# Patient Record
Sex: Female | Born: 1960
Health system: Southern US, Community
[De-identification: ages and names within clinical notes are randomized; demographics above are authoritative.]

## PROBLEM LIST (undated history)

## (undated) DIAGNOSIS — M1990 Unspecified osteoarthritis, unspecified site: Secondary | ICD-10-CM

## (undated) DIAGNOSIS — F419 Anxiety disorder, unspecified: Secondary | ICD-10-CM

## (undated) DIAGNOSIS — B1920 Unspecified viral hepatitis C without hepatic coma: Secondary | ICD-10-CM

## (undated) DIAGNOSIS — I10 Essential (primary) hypertension: Secondary | ICD-10-CM

## (undated) DIAGNOSIS — J45909 Unspecified asthma, uncomplicated: Secondary | ICD-10-CM

## (undated) DIAGNOSIS — F319 Bipolar disorder, unspecified: Secondary | ICD-10-CM

## (undated) DIAGNOSIS — J449 Chronic obstructive pulmonary disease, unspecified: Secondary | ICD-10-CM

## (undated) DIAGNOSIS — F1910 Other psychoactive substance abuse, uncomplicated: Secondary | ICD-10-CM

## (undated) DIAGNOSIS — M359 Systemic involvement of connective tissue, unspecified: Secondary | ICD-10-CM

## (undated) HISTORY — PX: ABDOMINAL HYSTERECTOMY: SHX81

## (undated) HISTORY — PX: TONSILLECTOMY: SUR1361

## (undated) HISTORY — PX: FOOT SURGERY: SHX648

## (undated) HISTORY — PX: TONSILLECTOMY: SHX5217

---

## 2004-05-13 ENCOUNTER — Inpatient Hospital Stay: Payer: Self-pay | Admitting: Unknown Physician Specialty

## 2004-12-21 ENCOUNTER — Emergency Department: Payer: Self-pay | Admitting: Emergency Medicine

## 2005-05-08 ENCOUNTER — Inpatient Hospital Stay: Payer: Self-pay | Admitting: Unknown Physician Specialty

## 2005-12-14 ENCOUNTER — Emergency Department: Payer: Self-pay | Admitting: Unknown Physician Specialty

## 2006-01-19 ENCOUNTER — Emergency Department: Payer: Self-pay | Admitting: Unknown Physician Specialty

## 2006-01-19 ENCOUNTER — Other Ambulatory Visit: Payer: Self-pay

## 2006-05-08 ENCOUNTER — Other Ambulatory Visit: Payer: Self-pay

## 2006-05-08 ENCOUNTER — Inpatient Hospital Stay: Payer: Self-pay | Admitting: Internal Medicine

## 2006-08-06 ENCOUNTER — Emergency Department: Payer: Self-pay | Admitting: Emergency Medicine

## 2006-10-16 ENCOUNTER — Inpatient Hospital Stay: Payer: Self-pay | Admitting: Internal Medicine

## 2006-10-16 ENCOUNTER — Other Ambulatory Visit: Payer: Self-pay

## 2007-12-13 ENCOUNTER — Emergency Department: Payer: Self-pay | Admitting: Emergency Medicine

## 2007-12-13 ENCOUNTER — Other Ambulatory Visit: Payer: Self-pay
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
## 2008-08-21 ENCOUNTER — Inpatient Hospital Stay: Payer: Self-pay | Admitting: Internal Medicine

## 2008-08-22 ENCOUNTER — Ambulatory Visit: Payer: Self-pay | Admitting: Cardiology

## 2010-01-18 ENCOUNTER — Emergency Department: Payer: Self-pay | Admitting: Emergency Medicine

## 2010-04-01 ENCOUNTER — Emergency Department: Payer: Self-pay | Admitting: Emergency Medicine

## 2010-09-22 ENCOUNTER — Emergency Department: Payer: Self-pay | Admitting: Emergency Medicine

## 2011-07-07 ENCOUNTER — Emergency Department: Payer: Self-pay | Admitting: Emergency Medicine

## 2011-10-13 ENCOUNTER — Emergency Department: Payer: Self-pay | Admitting: Emergency Medicine

## 2011-12-15 ENCOUNTER — Emergency Department: Payer: Self-pay | Admitting: Emergency Medicine

## 2012-03-28 ENCOUNTER — Emergency Department: Payer: Self-pay | Admitting: Emergency Medicine

## 2012-11-20 ENCOUNTER — Emergency Department: Payer: Self-pay | Admitting: Emergency Medicine

## 2013-08-20 ENCOUNTER — Emergency Department: Payer: Self-pay | Admitting: Internal Medicine

## 2014-10-05 ENCOUNTER — Ambulatory Visit
Admit: 2014-10-05 | Disposition: A | Discharge status: Home / Self Care | Payer: Self-pay | Attending: Podiatry | Admitting: Podiatry

## 2015-09-13 ENCOUNTER — Encounter: Payer: Self-pay | Admitting: Emergency Medicine

## 2015-09-13 ENCOUNTER — Emergency Department
Admission: EM | Admit: 2015-09-13 | Discharge: 2015-09-13 | Disposition: A | Discharge status: Home / Self Care | Payer: Medicaid Other | Attending: Emergency Medicine | Admitting: Emergency Medicine

## 2015-09-13 ENCOUNTER — Emergency Department: Payer: Medicaid Other

## 2015-09-13 DIAGNOSIS — S8992XA Unspecified injury of left lower leg, initial encounter: Secondary | ICD-10-CM | POA: Diagnosis present

## 2015-09-13 DIAGNOSIS — S8012XA Contusion of left lower leg, initial encounter: Secondary | ICD-10-CM

## 2015-09-13 DIAGNOSIS — S40021A Contusion of right upper arm, initial encounter: Secondary | ICD-10-CM | POA: Diagnosis not present

## 2015-09-13 DIAGNOSIS — S90512A Abrasion, left ankle, initial encounter: Secondary | ICD-10-CM | POA: Diagnosis not present

## 2015-09-13 DIAGNOSIS — Y998 Other external cause status: Secondary | ICD-10-CM | POA: Insufficient documentation

## 2015-09-13 DIAGNOSIS — Y9389 Activity, other specified: Secondary | ICD-10-CM | POA: Insufficient documentation

## 2015-09-13 DIAGNOSIS — F17200 Nicotine dependence, unspecified, uncomplicated: Secondary | ICD-10-CM | POA: Insufficient documentation

## 2015-09-13 DIAGNOSIS — S80812A Abrasion, left lower leg, initial encounter: Secondary | ICD-10-CM

## 2015-09-13 DIAGNOSIS — Y9289 Other specified places as the place of occurrence of the external cause: Secondary | ICD-10-CM | POA: Insufficient documentation

## 2015-09-13 MED ORDER — IBUPROFEN 600 MG PO TABS: 600.0000 mg | ORAL_TABLET | Freq: Three times a day (TID) | ORAL | Status: DC | PRN

## 2015-09-13 MED ORDER — IBUPROFEN 600 MG PO TABS
600.0000 mg | ORAL_TABLET | Freq: Once | ORAL | Status: AC
  Administered 2015-09-13: 600 mg via ORAL
  Filled 2015-09-13: qty 1
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
## 2015-09-13 NOTE — ED Notes (Signed)
Patient received 100 mcg of fentanyl prior to arrival by EMS.
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
## 2015-09-13 NOTE — ED Notes (Signed)
Dr. Lord at bedside.  

## 2015-09-13 NOTE — ED Notes (Signed)
X-ray at bedside
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
## 2015-09-22 ENCOUNTER — Encounter: Payer: Self-pay | Admitting: Emergency Medicine

## 2015-09-22 ENCOUNTER — Emergency Department
Admission: EM | Admit: 2015-09-22 | Discharge: 2015-09-22 | Disposition: A | Discharge status: Home / Self Care | Payer: Medicaid Other | Attending: Emergency Medicine | Admitting: Emergency Medicine

## 2015-09-22 ENCOUNTER — Emergency Department: Payer: Medicaid Other

## 2015-09-22 DIAGNOSIS — T148XXA Other injury of unspecified body region, initial encounter: Secondary | ICD-10-CM

## 2015-09-22 DIAGNOSIS — S8012XA Contusion of left lower leg, initial encounter: Secondary | ICD-10-CM | POA: Insufficient documentation

## 2015-09-22 DIAGNOSIS — Y9289 Other specified places as the place of occurrence of the external cause: Secondary | ICD-10-CM | POA: Diagnosis not present

## 2015-09-22 DIAGNOSIS — Y9389 Activity, other specified: Secondary | ICD-10-CM | POA: Insufficient documentation

## 2015-09-22 DIAGNOSIS — Y998 Other external cause status: Secondary | ICD-10-CM | POA: Insufficient documentation

## 2015-09-22 DIAGNOSIS — W2203XA Walked into furniture, initial encounter: Secondary | ICD-10-CM | POA: Diagnosis not present

## 2015-09-22 DIAGNOSIS — F17200 Nicotine dependence, unspecified, uncomplicated: Secondary | ICD-10-CM | POA: Diagnosis not present

## 2015-09-22 DIAGNOSIS — S8992XA Unspecified injury of left lower leg, initial encounter: Secondary | ICD-10-CM | POA: Diagnosis present

## 2015-09-22 MED ORDER — IBUPROFEN 600 MG PO TABS: 600.0000 mg | ORAL_TABLET | Freq: Three times a day (TID) | ORAL | Status: DC | PRN

## 2015-09-22 MED ORDER — OXYCODONE HCL 5 MG PO TABS
5.0000 mg | ORAL_TABLET | Freq: Once | ORAL | Status: AC
  Administered 2015-09-22: 5 mg via ORAL
  Filled 2015-09-22: qty 1

## 2015-09-22 MED ORDER — MUPIROCIN 2 % EX OINT: TOPICAL_OINTMENT | CUTANEOUS | Status: DC

## 2015-09-22 NOTE — ED Notes (Signed)
Pt arrived via EMS from home. Pt seen and treated in ED 03/10 for ulcer to left leg. Pt states pain and swelling has increased. EMS reports VSS, T 97.0 oral, 98% RA.

## 2015-09-22 NOTE — ED Notes (Signed)
Crutch teaching done. Pt demonstrated understanding.

## 2015-09-22 NOTE — ED Notes (Signed)
Patient transported to Ultrasound 

## 2015-09-22 NOTE — Discharge Instructions (Signed)
Abrasion °An abrasion is a cut or scrape on the outer surface of your skin. An abrasion does not extend through all of the layers of your skin. It is important to care for your abrasion properly to prevent infection. °CAUSES °Most abrasions are caused by falling on or gliding across the ground or another surface. When your skin rubs on something, the outer and inner layer of skin rubs off.  °SYMPTOMS °A cut or scrape is the main symptom of this condition. The scrape may be bleeding, or it may appear red or pink. If there was an associated fall, there may be an underlying bruise. °DIAGNOSIS °An abrasion is diagnosed with a physical exam. °TREATMENT °Treatment for this condition depends on how large and deep the abrasion is. Usually, your abrasion will be cleaned with water and mild soap. This removes any dirt or debris that may be stuck. An antibiotic ointment may be applied to the abrasion to help prevent infection. A bandage (dressing) may be placed on the abrasion to keep it clean. °You may also need a tetanus shot. °HOME CARE INSTRUCTIONS °Medicines °· Take or apply medicines only as directed by your health care provider. °· If you were prescribed an antibiotic ointment, finish all of it even if you start to feel better. °Wound Care °· Clean the wound with mild soap and water 2-3 times per day or as directed by your health care provider. Pat your wound dry with a clean towel. Do not rub it. °· There are many different ways to close and cover a wound. Follow instructions from your health care provider about: °¨ Wound care. °¨ Dressing changes and removal. °· Check your wound every day for signs of infection. Watch for: °¨ Redness, swelling, or pain. °¨ Fluid, blood, or pus. °General Instructions °· Keep the dressing dry as directed by your health care provider. Do not take baths, swim, use a hot tub, or do anything that would put your wound underwater until your health care provider approves. °· If there is  swelling, raise (elevate) the injured area above the level of your heart while you are sitting or lying down. °· Keep all follow-up visits as directed by your health care provider. This is important. °SEEK MEDICAL CARE IF: °· You received a tetanus shot and you have swelling, severe pain, redness, or bleeding at the injection site. °· Your pain is not controlled with medicine. °· You have increased redness, swelling, or pain at the site of your wound. °SEEK IMMEDIATE MEDICAL CARE IF: °· You have a red streak going away from your wound. °· You have a fever. °· You have fluid, blood, or pus coming from your wound. °· You notice a bad smell coming from your wound or your dressing. °  °This information is not intended to replace advice given to you by your health care provider. Make sure you discuss any questions you have with your health care provider. °  °Document Released: 03/28/2005 Document Revised: 03/09/2015 Document Reviewed: 06/16/2014 °Elsevier Interactive Patient Education ©2016 Else[employer] Inc[employer] °Contusion °A contusion is a deep bruise. Contusions are the result of a blunt injury to tissues and muscle fibers under the skin. The injury causes bleeding under the skin. The skin overlying the contusion may turn blue, purple, or yellow. Minor injuries will give you a painless contusion, but more severe contusions may stay painful and swollen for a few weeks.  °CAUSES  °This condition is usually caused by a blow, trauma, or direct force to   an area of the body. °SYMPTOMS  °Symptoms of this condition include: °· Swelling of the injured area. °· Pain and tenderness in the injured area. °· Discoloration. The area may have redness and then turn blue, purple, or yellow. °DIAGNOSIS  °This condition is diagnosed based on a physical exam and medical history. An X-ray, CT scan, or MRI may be needed to determine if there are any associated injuries, such as broken bones (fractures). °TREATMENT  °Specific treatment for this  condition depends on what area of the body was injured. In general, the best treatment for a contusion is resting, icing, applying pressure to (compression), and elevating the injured area. This is often called the RICE strategy. Over-the-counter anti-inflammatory medicines may also be recommended for pain control.  °HOME CARE INSTRUCTIONS  °· Rest the injured area. °· If directed, apply ice to the injured area: °¨ Put ice in a plastic bag. °¨ Place a towel between your skin and the bag. °¨ Leave the ice on for 20 minutes, 2-3 times per day. °· If directed, apply light compression to the injured area using an elastic bandage. Make sure the bandage is not wrapped too tightly. Remove and reapply the bandage as directed by your health care provider. °· If possible, raise (elevate) the injured area above the level of your heart while you are sitting or lying down. °· Take over-the-counter and prescription medicines only as told by your health care provider. °SEEK MEDICAL CARE IF: °· Your symptoms do not improve after several days of treatment. °· Your symptoms get worse. °· You have difficulty moving the injured area. °SEEK IMMEDIATE MEDICAL CARE IF:  °· You have severe pain. °· You have numbness in a hand or foot. °· Your hand or foot turns pale or cold. °  °This information is not intended to replace advice given to you by your health care provider. Make sure you discuss any questions you have with your health care provider. °  °Document Released: 03/28/2005 Document Revised: 03/09/2015 Document Reviewed: 11/03/2014 °Elsevier Interactive Patient Education ©2016 Else[employer] Inc. ° °
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
## 2015-10-05 ENCOUNTER — Inpatient Hospital Stay
Admission: EM | Admit: 2015-10-05 | Discharge: 2015-10-09 | DRG: 603 | Disposition: A | Discharge status: Home / Self Care | Payer: Medicaid Other | Attending: Internal Medicine | Admitting: Internal Medicine

## 2015-10-05 ENCOUNTER — Emergency Department: Payer: Medicaid Other

## 2015-10-05 ENCOUNTER — Encounter: Payer: Self-pay | Admitting: Emergency Medicine

## 2015-10-05 DIAGNOSIS — A419 Sepsis, unspecified organism: Secondary | ICD-10-CM | POA: Diagnosis present

## 2015-10-05 DIAGNOSIS — S8982XS Other specified injuries of left lower leg, sequela: Secondary | ICD-10-CM

## 2015-10-05 DIAGNOSIS — L03116 Cellulitis of left lower limb: Principal | ICD-10-CM | POA: Diagnosis present

## 2015-10-05 DIAGNOSIS — F411 Generalized anxiety disorder: Secondary | ICD-10-CM | POA: Diagnosis present

## 2015-10-05 DIAGNOSIS — L02416 Cutaneous abscess of left lower limb: Secondary | ICD-10-CM | POA: Diagnosis present

## 2015-10-05 DIAGNOSIS — Z823 Family history of stroke: Secondary | ICD-10-CM

## 2015-10-05 DIAGNOSIS — J45909 Unspecified asthma, uncomplicated: Secondary | ICD-10-CM | POA: Diagnosis present

## 2015-10-05 DIAGNOSIS — F329 Major depressive disorder, single episode, unspecified: Secondary | ICD-10-CM | POA: Diagnosis present

## 2015-10-05 DIAGNOSIS — Z79899 Other long term (current) drug therapy: Secondary | ICD-10-CM

## 2015-10-05 DIAGNOSIS — W228XXS Striking against or struck by other objects, sequela: Secondary | ICD-10-CM

## 2015-10-05 DIAGNOSIS — R030 Elevated blood-pressure reading, without diagnosis of hypertension: Secondary | ICD-10-CM | POA: Diagnosis present

## 2015-10-05 DIAGNOSIS — Z8262 Family history of osteoporosis: Secondary | ICD-10-CM

## 2015-10-05 DIAGNOSIS — L0390 Cellulitis, unspecified: Secondary | ICD-10-CM | POA: Insufficient documentation

## 2015-10-05 DIAGNOSIS — F17200 Nicotine dependence, unspecified, uncomplicated: Secondary | ICD-10-CM | POA: Diagnosis present

## 2015-10-05 DIAGNOSIS — F419 Anxiety disorder, unspecified: Secondary | ICD-10-CM | POA: Diagnosis present

## 2015-10-05 DIAGNOSIS — L0291 Cutaneous abscess, unspecified: Secondary | ICD-10-CM | POA: Diagnosis present

## 2015-10-05 HISTORY — DX: Unspecified asthma, uncomplicated: J45.909

## 2015-10-05 HISTORY — DX: Systemic involvement of connective tissue, unspecified: M35.9

## 2015-10-05 HISTORY — DX: Anxiety disorder, unspecified: F41.9

## 2015-10-05 LAB — LACTIC ACID, PLASMA: Lactic Acid, Venous: 0.8 mmol/L (ref 0.5–2.0)

## 2015-10-05 LAB — CBC WITH DIFFERENTIAL/PLATELET
BASOS ABS: 0.1 10*3/uL (ref 0–0.1)
Basophils Relative: 1 %
Eosinophils Absolute: 0.4 10*3/uL (ref 0–0.7)
Eosinophils Relative: 3 %
HEMATOCRIT: 33.7 % — AB (ref 35.0–47.0)
Hemoglobin: 11.6 g/dL — ABNORMAL LOW (ref 12.0–16.0)
LYMPHS PCT: 19 %
Lymphs Abs: 2.4 10*3/uL (ref 1.0–3.6)
MCH: 34.1 pg — ABNORMAL HIGH (ref 26.0–34.0)
MCHC: 34.4 g/dL (ref 32.0–36.0)
MCV: 99.2 fL (ref 80.0–100.0)
MONO ABS: 0.7 10*3/uL (ref 0.2–0.9)
Monocytes Relative: 6 %
NEUTROS ABS: 9.2 10*3/uL — AB (ref 1.4–6.5)
Neutrophils Relative %: 71 %
Platelets: 228 10*3/uL (ref 150–440)
RBC: 3.39 MIL/uL — ABNORMAL LOW (ref 3.80–5.20)
RDW: 14.3 % (ref 11.5–14.5)
WBC: 12.8 10*3/uL — ABNORMAL HIGH (ref 3.6–11.0)

## 2015-10-05 LAB — COMPREHENSIVE METABOLIC PANEL
ALBUMIN: 4.1 g/dL (ref 3.5–5.0)
ALT: 25 U/L (ref 14–54)
AST: 35 U/L (ref 15–41)
Alkaline Phosphatase: 105 U/L (ref 38–126)
Anion gap: 9 (ref 5–15)
BILIRUBIN TOTAL: 0.4 mg/dL (ref 0.3–1.2)
BUN: 12 mg/dL (ref 6–20)
CHLORIDE: 107 mmol/L (ref 101–111)
CO2: 22 mmol/L (ref 22–32)
Calcium: 9.2 mg/dL (ref 8.9–10.3)
Creatinine, Ser: 0.78 mg/dL (ref 0.44–1.00)
GFR calc Af Amer: >60 mL/min (ref >60)
GFR calc non Af Amer: >60 mL/min (ref >60)
GLUCOSE: 103 mg/dL — AB (ref 65–99)
POTASSIUM: 3.7 mmol/L (ref 3.5–5.1)
Sodium: 138 mmol/L (ref 135–145)
TOTAL PROTEIN: 7.1 g/dL (ref 6.5–8.1)

## 2015-10-05 MED ORDER — SODIUM CHLORIDE 0.9 % IV BOLUS (SEPSIS)
1000.0000 mL | INTRAVENOUS | Status: AC
  Administered 2015-10-05 (×2): 1000 mL via INTRAVENOUS

## 2015-10-05 MED ORDER — IOPAMIDOL (ISOVUE-300) INJECTION 61%
100.0000 mL | Freq: Once | INTRAVENOUS | Status: AC | PRN
  Administered 2015-10-05: 100 mL via INTRAVENOUS

## 2015-10-05 MED ORDER — PIPERACILLIN-TAZOBACTAM 3.375 G IVPB 30 MIN
3.3750 g | Freq: Once | INTRAVENOUS | Status: AC
  Administered 2015-10-05: 3.375 g via INTRAVENOUS
  Filled 2015-10-05: qty 50

## 2015-10-05 MED ORDER — VANCOMYCIN HCL IN DEXTROSE 1-5 GM/200ML-% IV SOLN
1000.0000 mg | Freq: Once | INTRAVENOUS | Status: AC
  Administered 2015-10-05: 1000 mg via INTRAVENOUS
  Filled 2015-10-05: qty 200

## 2015-10-05 MED ORDER — MORPHINE SULFATE (PF) 4 MG/ML IV SOLN
4.0000 mg | Freq: Once | INTRAVENOUS | Status: AC
  Administered 2015-10-05: 4 mg via INTRAVENOUS
  Filled 2015-10-05: qty 1

## 2015-10-05 MED ORDER — ONDANSETRON HCL 4 MG/2ML IJ SOLN
4.0000 mg | Freq: Once | INTRAMUSCULAR | Status: AC
  Administered 2015-10-05: 4 mg via INTRAVENOUS
  Filled 2015-10-05: qty 2
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
## 2015-10-05 NOTE — ED Notes (Signed)
Patient transported to CT 
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
## 2015-10-05 NOTE — ED Notes (Signed)
Lab results reviewed. Awaiting bed placement for MD eval.

## 2015-10-06 ENCOUNTER — Encounter: Payer: Self-pay | Admitting: Internal Medicine

## 2015-10-06 DIAGNOSIS — J45909 Unspecified asthma, uncomplicated: Secondary | ICD-10-CM | POA: Diagnosis present

## 2015-10-06 DIAGNOSIS — F419 Anxiety disorder, unspecified: Secondary | ICD-10-CM | POA: Diagnosis present

## 2015-10-06 DIAGNOSIS — L03116 Cellulitis of left lower limb: Secondary | ICD-10-CM | POA: Diagnosis present

## 2015-10-06 DIAGNOSIS — L0291 Cutaneous abscess, unspecified: Secondary | ICD-10-CM | POA: Diagnosis present

## 2015-10-06 DIAGNOSIS — S8982XS Other specified injuries of left lower leg, sequela: Secondary | ICD-10-CM | POA: Diagnosis not present

## 2015-10-06 DIAGNOSIS — F411 Generalized anxiety disorder: Secondary | ICD-10-CM | POA: Diagnosis present

## 2015-10-06 DIAGNOSIS — Z8262 Family history of osteoporosis: Secondary | ICD-10-CM | POA: Diagnosis not present

## 2015-10-06 DIAGNOSIS — A419 Sepsis, unspecified organism: Secondary | ICD-10-CM | POA: Diagnosis present

## 2015-10-06 DIAGNOSIS — Z79899 Other long term (current) drug therapy: Secondary | ICD-10-CM | POA: Diagnosis not present

## 2015-10-06 DIAGNOSIS — F329 Major depressive disorder, single episode, unspecified: Secondary | ICD-10-CM | POA: Diagnosis present

## 2015-10-06 DIAGNOSIS — F17200 Nicotine dependence, unspecified, uncomplicated: Secondary | ICD-10-CM | POA: Diagnosis present

## 2015-10-06 DIAGNOSIS — Z823 Family history of stroke: Secondary | ICD-10-CM | POA: Diagnosis not present

## 2015-10-06 DIAGNOSIS — L02416 Cutaneous abscess of left lower limb: Secondary | ICD-10-CM | POA: Diagnosis present

## 2015-10-06 DIAGNOSIS — W228XXS Striking against or struck by other objects, sequela: Secondary | ICD-10-CM | POA: Diagnosis not present

## 2015-10-06 DIAGNOSIS — L0390 Cellulitis, unspecified: Secondary | ICD-10-CM | POA: Diagnosis present

## 2015-10-06 DIAGNOSIS — R030 Elevated blood-pressure reading, without diagnosis of hypertension: Secondary | ICD-10-CM | POA: Diagnosis present

## 2015-10-06 LAB — BASIC METABOLIC PANEL
Anion gap: 4 — ABNORMAL LOW (ref 5–15)
BUN: 9 mg/dL (ref 6–20)
CHLORIDE: 112 mmol/L — AB (ref 101–111)
CO2: 23 mmol/L (ref 22–32)
CREATININE: 0.69 mg/dL (ref 0.44–1.00)
Calcium: 8.7 mg/dL — ABNORMAL LOW (ref 8.9–10.3)
GFR calc non Af Amer: >60 mL/min (ref >60)
GLUCOSE: 101 mg/dL — AB (ref 65–99)
Potassium: 3.5 mmol/L (ref 3.5–5.1)
Sodium: 139 mmol/L (ref 135–145)

## 2015-10-06 LAB — CBC
HCT: 33.7 % — ABNORMAL LOW (ref 35.0–47.0)
Hemoglobin: 11.7 g/dL — ABNORMAL LOW (ref 12.0–16.0)
MCH: 35 pg — AB (ref 26.0–34.0)
MCHC: 34.6 g/dL (ref 32.0–36.0)
MCV: 101.2 fL — ABNORMAL HIGH (ref 80.0–100.0)
PLATELETS: 192 10*3/uL (ref 150–440)
RBC: 3.33 MIL/uL — AB (ref 3.80–5.20)
RDW: 14.7 % — AB (ref 11.5–14.5)
WBC: 5.8 10*3/uL (ref 3.6–11.0)

## 2015-10-06 LAB — LACTIC ACID, PLASMA: Lactic Acid, Venous: 1 mmol/L (ref 0.5–2.0)

## 2015-10-06 MED ORDER — SODIUM CHLORIDE 0.9% FLUSH
3.0000 mL | Freq: Two times a day (BID) | INTRAVENOUS | Status: DC
  Administered 2015-10-06 – 2015-10-09 (×6): 3 mL via INTRAVENOUS

## 2015-10-06 MED ORDER — METOPROLOL TARTRATE 50 MG PO TABS
50.0000 mg | ORAL_TABLET | Freq: Two times a day (BID) | ORAL | Status: DC
  Filled 2015-10-06: qty 1

## 2015-10-06 MED ORDER — ONDANSETRON HCL 4 MG/2ML IJ SOLN: 4.0000 mg | Freq: Four times a day (QID) | INTRAMUSCULAR | Status: DC | PRN

## 2015-10-06 MED ORDER — PIPERACILLIN-TAZOBACTAM 3.375 G IVPB
3.3750 g | Freq: Three times a day (TID) | INTRAVENOUS | Status: DC
  Administered 2015-10-06 – 2015-10-09 (×10): 3.375 g via INTRAVENOUS
  Filled 2015-10-06 (×12): qty 50

## 2015-10-06 MED ORDER — NICOTINE 14 MG/24HR TD PT24
14.0000 mg | MEDICATED_PATCH | Freq: Every day | TRANSDERMAL | Status: DC
  Administered 2015-10-06 – 2015-10-09 (×4): 14 mg via TRANSDERMAL
  Filled 2015-10-06 (×4): qty 1

## 2015-10-06 MED ORDER — QUETIAPINE FUMARATE 100 MG PO TABS
300.0000 mg | ORAL_TABLET | Freq: Every day | ORAL | Status: DC
  Administered 2015-10-06 – 2015-10-08 (×4): 300 mg via ORAL
  Filled 2015-10-06: qty 3
  Filled 2015-10-06: qty 4
  Filled 2015-10-06 (×2): qty 3

## 2015-10-06 MED ORDER — ACETAMINOPHEN 650 MG RE SUPP: 650.0000 mg | Freq: Four times a day (QID) | RECTAL | Status: DC | PRN

## 2015-10-06 MED ORDER — GABAPENTIN 300 MG PO CAPS
300.0000 mg | ORAL_CAPSULE | Freq: Three times a day (TID) | ORAL | Status: DC
  Administered 2015-10-06 – 2015-10-09 (×10): 300 mg via ORAL
  Filled 2015-10-06 (×10): qty 1

## 2015-10-06 MED ORDER — VANCOMYCIN HCL IN DEXTROSE 1-5 GM/200ML-% IV SOLN
1000.0000 mg | Freq: Two times a day (BID) | INTRAVENOUS | Status: DC
  Administered 2015-10-06 – 2015-10-08 (×5): 1000 mg via INTRAVENOUS
  Filled 2015-10-06 (×6): qty 200

## 2015-10-06 MED ORDER — COLLAGENASE 250 UNIT/GM EX OINT
TOPICAL_OINTMENT | Freq: Every day | CUTANEOUS | Status: DC
  Administered 2015-10-06 – 2015-10-07 (×2): via TOPICAL
  Administered 2015-10-08: 1 via TOPICAL
  Filled 2015-10-06 (×2): qty 30

## 2015-10-06 MED ORDER — MORPHINE SULFATE (PF) 4 MG/ML IV SOLN
4.0000 mg | INTRAVENOUS | Status: DC | PRN
  Administered 2015-10-06 – 2015-10-07 (×7): 4 mg via INTRAVENOUS
  Filled 2015-10-06 (×7): qty 1

## 2015-10-06 MED ORDER — CLONAZEPAM 1 MG PO TABS
1.0000 mg | ORAL_TABLET | Freq: Two times a day (BID) | ORAL | Status: DC
  Administered 2015-10-06 – 2015-10-09 (×8): 1 mg via ORAL
  Filled 2015-10-06 (×3): qty 1
  Filled 2015-10-06: qty 2
  Filled 2015-10-06 (×4): qty 1

## 2015-10-06 MED ORDER — ACETAMINOPHEN 325 MG PO TABS: 650.0000 mg | ORAL_TABLET | Freq: Four times a day (QID) | ORAL | Status: DC | PRN

## 2015-10-06 MED ORDER — DULOXETINE HCL 30 MG PO CPEP
30.0000 mg | ORAL_CAPSULE | Freq: Every day | ORAL | Status: DC
  Administered 2015-10-06 – 2015-10-09 (×4): 30 mg via ORAL
  Filled 2015-10-06 (×6): qty 1

## 2015-10-06 MED ORDER — METOPROLOL TARTRATE 50 MG PO TABS
50.0000 mg | ORAL_TABLET | Freq: Two times a day (BID) | ORAL | Status: DC
  Administered 2015-10-06 – 2015-10-07 (×3): 50 mg via ORAL
  Filled 2015-10-06 (×2): qty 1

## 2015-10-06 MED ORDER — ENOXAPARIN SODIUM 40 MG/0.4ML ~~LOC~~ SOLN
40.0000 mg | SUBCUTANEOUS | Status: DC
  Administered 2015-10-06 – 2015-10-08 (×3): 40 mg via SUBCUTANEOUS
  Filled 2015-10-06 (×3): qty 0.4

## 2015-10-06 MED ORDER — ONDANSETRON HCL 4 MG PO TABS: 4.0000 mg | ORAL_TABLET | Freq: Four times a day (QID) | ORAL | Status: DC | PRN

## 2015-10-06 MED ORDER — SODIUM CHLORIDE 0.9 % IV SOLN
INTRAVENOUS | Status: DC
  Administered 2015-10-06: 04:00:00 via INTRAVENOUS

## 2015-10-06 NOTE — Progress Notes (Signed)
Pharmacy Antibiotic Note  Nicole OlszewskiKimberly Dawn Tan is a 55 y.o. female admitted on 10/05/2015 with cellulitis.  Pharmacy has been consulted for vancomycin and Zosyn dosing.  Plan: kei 0.066 hr-1  t1/2 hours DW 64kg  Vd 45L Vancomycin 1 gram q 12 hours ordered with stacked dosing. Level before 5th dose. Goal trough 15-20.  Zosyn 3.375 grams q 8 hours ordered.  Height: 5\' 3"  (160 cm) Weight: 141 lb (63.957 kg) IBW/kg (Calculated) : 52.4  Temp (24hrs), Avg:98.3 F (36.8 C), Min:97.6 F (36.4 C), Max:99.6 F (37.6 C)   Recent Labs Lab 10/05/15 1804 10/05/15 2231 10/06/15 0134  WBC 12.8*  --   --   CREATININE 0.78  --   --   LATICACIDVEN  --  0.8 1.0    Estimated Creatinine Clearance: 74 mL/min (by C-G formula based on Cr of 0.78).    No Known Allergies  Antimicrobials this admission:   Vancomycin Zosyn  >>    >>   Dose adjustments this admission:   Microbiology results: 4/5 BCx: pending  Thank you for allowing pharmacy to be a part of this patient's care.  Chae Shuster S 10/06/2015 4:01 AM
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
## 2015-10-06 NOTE — Progress Notes (Signed)
[facility] [place] [facility]   PATIENT NAME: Nicole [name]    MR#:  [id number]  DATE OF BIRTH:  27-May-1961  SUBJECTIVE: 55 year old female patient admitted for left leg cellulitis. Patient was seen in the emergency room on March 23. Patient has significant drainage and erythema of the left leg.   CHIEF COMPLAINT:   Chief Complaint  Patient presents with  . Wound Infection    REVIEW OF SYSTEMS:    Review of Systems  Constitutional: Negative for fever and chills.  HENT: Negative for hearing loss.   Eyes: Negative for blurred vision, double vision and photophobia.  Respiratory: Negative for cough, hemoptysis and shortness of breath.   Cardiovascular: Negative for palpitations, orthopnea and leg swelling.  Gastrointestinal: Negative for vomiting, abdominal pain and diarrhea.  Genitourinary: Negative for dysuria and urgency.  Musculoskeletal: Negative for myalgias and neck pain.       Left leg pain, redness, drainage  Skin: Negative for rash.  Neurological: Negative for dizziness, focal weakness, seizures, weakness and headaches.  Psychiatric/Behavioral: Negative for memory loss. The patient does not have insomnia.     Nutrition:  Tolerating Diet: Tolerating PT:      DRUG ALLERGIES:  No Known Allergies  VITALS:  Blood pressure 150/100, pulse 76, temperature 98 F (36.7 C), temperature source Oral, resp. rate 18, height  (1.6 m), weight 63.957 kg (141 lb), SpO2 100 %.  PHYSICAL EXAMINATION:   Physical Exam  GENERAL:  55 y.o.-year-old patient lying in the bed with no acute distress.  EYES: Pupils equal, round, reactive to light and accommodation. No scleral icterus. Extraocular muscles intact.  HEENT: Head atraumatic, normocephalic. Oropharynx and nasopharynx clear.  NECK:  Supple, no jugular venous distention. No thyroid enlargement, no tenderness.  LUNGS: Normal breath sounds bilaterally, no wheezing, rales,rhonchi or crepitation.  No use of accessory muscles of respiration.  CARDIOVASCULAR: S1, S2 normal. No murmurs, rubs, or gallops.  ABDOMEN: Soft, nontender, nondistended. Bowel sounds present. No organomegaly or mass.  EXTREMITIES;eft leg is edematous,tender to palpation. open areas or drainage observed and the lower part of the leg  With pus.  :NEUROLOGIC: Cranial nerves II through XII are intact. Muscle strength 5/5 in all extremities. Sensation intact. Gait not checked.  PSYCHIATRIC: The patient is alert and oriented x 3.  SKIN: No obvious rash, lesion, or ulcer.    LABORATORY PANEL:   CBC  Recent Labs Lab 10/06/15 0530  WBC 5.8  HGB 11.7*  HCT 33.7*  PLT 192   ------------------------------------------------------------------------------------------------------------------  Chemistries   Recent Labs Lab 10/05/15 1804 10/06/15 0530  NA 138 139  K 3.7 3.5  CL 107 112*  CO2 22 23  GLUCOSE 103* 101*  BUN 12 9  CREATININE 0.78 0.69  CALCIUM 9.2 8.7*  AST 35  --   ALT 25  --   ALKPHOS 105  --   BILITOT 0.4  --    ------------------------------------------------------------------------------------------------------------------  Cardiac Enzymes No results for input(s): TROPONINI in the last 168 hours. ------------------------------------------------------------------------------------------------------------------  RADIOLOGY:  Ct Tibia Fibula Left W Contrast  10/05/2015  CLINICAL DATA:  Cellulitis. Wound with purulent drainage. Patient reports left leg wound for 3 weeks, recent injury. EXAM: CT OF THE LEFT TIBIA AND FIBULA WITH CONTRAST TECHNIQUE: Axial images of the left tibia/ fibula after the IV administration of contrast. Multiplanar reformats. CONTRAST:  ISOVUE-300 IOPAMIDOL (ISOVUE-300) INJECTION 61% COMPARISON:  Radiographs 09/13/2015, right lower extremity duplex 09/22/2015 FINDINGS: Skin thickening and soft tissue edema about the  distal medial right lower leg. There is within  oblong fluid collection measuring 5.7 x 1.1 x 1.1 cm in the region of skin thickening. Some peripheral enhancement. The medial border is ill-defined with probable extension to a small skin defect about the superior aspect of the collection. Single internal focus of air without tracking air to suggest necrotizing infection. No additional fluid collection. No evidence of intramuscular extension. No periosteal reaction or bony destructive change of the subjacent tibia. No acute fracture. IMPRESSION: Skin thickening and subcutaneous edema of the distal medial lower leg consistent with cellulitis. Thin oblong fluid collection deep and distal to a skin defect is elongated measuring 5.7 x 1.1 x 1.1 cm, likely ill-defined abscess, less likely hematoma in the setting of injury. Electronically Signed   By: Rubye OaksMelanie  Ehinger M.D.   On: 10/05/2015 23:28     ASSESSMENT AND PLAN:   Principal Problem:   Sepsis (HCC) Active Problems:   Cellulitis and abscess   Anxiety   Asthma  #1 sepsis present on admission: Due to left leg cellulitis 'and abscess:: Continue IV by antibiotics, patient is on IV vancomycin, Zosyn. consult wound care, follow wound cultures, consult surgery for possible drainage. Continue IV hydration. #2 .history of tobacco abuse: No wheezing at this time. Continue nicotine patch, controlled. #3. depression: We'll continue Seroquel, Cymbalta,    All the records are reviewed and case discussed with Care Management/Social Workerr. Management plans discussed with the patient, family and they are in agreement.  CODE STATUS: full  TOTAL TIME TAKING CARE OF THIS PATIENT:5235minutes.   POSSIBLE D/C IN 1-2 DAYS, DEPENDING ON CLINICAL CONDITION.   Katha HammingKONIDENA,Lanaya Bennis M.D on 10/06/2015 at 11:11 AM  Between 7am to 6pm - Pager - [phone]  After 6pm go to [email or web address] - password [email or web address] [email or web address]  [facility]  Office  [phone]  CC: Primary care physician; [facility]

## 2015-10-06 NOTE — H&P (Signed)
[facility] [place] [facility]   PATIENT NAME: Nicole CascoKimberly Wormley    MR#:  [id number]  DATE OF BIRTH:  Nov 28, 1960  DATE OF ADMISSION:  10/05/2015  PRIMARY CARE PHYSICIAN: [facility]   REQUESTING/REFERRING PHYSICIAN: Manson PasseyBrown, M.D.  CHIEF COMPLAINT:   Chief Complaint  Patient presents with  . Wound Infection    HISTORY OF PRESENT ILLNESS:  Nicole [name]  is a 55 y.o. female who presents with left lower extremity wound infection and cellulitis. Patient states that a table fell on her leg couple weeks ago she developed subsequent injury to the leg with some blistering which then led to an ulcerated lesion. She came to have it looked at, and was prescribed a topical antibiotic ointment. She states that it never really got much better, and about 4 days ago he began to grow red around the borders and had significant drainage. She tried to clean it out with peroxide multiple times a day, with no improvement. She came to the ED for evaluation today. Here she was found to be in sepsis with  PAST MEDICAL HISTORY:   Past Medical History  Diagnosis Date  . Asthma   . Collagen vascular disease (HCC)   . Anxiety     PAST SURGICAL HISTORY:   Past Surgical History  Procedure Laterality Date  . Foot surgery    . Abdominal hysterectomy      SOCIAL HISTORY:   Social History  Substance Use Topics  . Smoking status: Current Every Day Smoker  . Smokeless tobacco: Not on file  . Alcohol Use: No    FAMILY HISTORY:   Family History  Problem Relation Age of Onset  . Rheum arthritis    . Osteoporosis    . Clotting disorder    . Stroke      DRUG ALLERGIES:  No Known Allergies  MEDICATIONS AT HOME:   Prior to Admission medications   Medication Sig Start Date End Date Taking? Authorizing Provider  BELSOMRA 15 MG TABS Take 1 tablet by mouth daily. 09/16/15  Yes Historical Provider, MD  clonazePAM (KLONOPIN) 1 MG tablet Take 1 mg by mouth 2 (two)  times daily.   Yes Historical Provider, MD  DULoxetine (CYMBALTA) 30 MG capsule Take 30 mg by mouth daily.   Yes Historical Provider, MD  gabapentin (NEURONTIN) 300 MG capsule Take 300 mg by mouth 3 (three) times daily.   Yes Historical Provider, MD  Ibuprofen-Diphenhydramine Cit (ADVIL PM PO) Take 1 tablet by mouth every evening.   Yes Historical Provider, MD  QUEtiapine (SEROQUEL) 300 MG tablet Take 300 mg by mouth at bedtime.   Yes Historical Provider, MD    REVIEW OF SYSTEMS:  Review of Systems  Constitutional: Negative for fever, chills, weight loss and malaise/fatigue.  HENT: Negative for ear pain, hearing loss and tinnitus.   Eyes: Negative for blurred vision, double vision, pain and redness.  Respiratory: Negative for cough, hemoptysis and shortness of breath.   Cardiovascular: Negative for chest pain, palpitations, orthopnea and leg swelling.  Gastrointestinal: Negative for nausea, vomiting, abdominal pain, diarrhea and constipation.  Genitourinary: Negative for dysuria, frequency and hematuria.  Musculoskeletal: Negative for back pain, joint pain and neck pain.       Left lower extremity pain  Skin:       Draining ulcerated wound left lower extremity   Neurological: Negative for dizziness, tremors, focal weakness and weakness.  Endo/Heme/Allergies: Negative for polydipsia. Does not bruise/bleed easily.  Psychiatric/Behavioral: Negative for  depression. The patient is not nervous/anxious and does not have insomnia.      VITAL SIGNS:   Filed Vitals:   10/05/15 2147 10/05/15 2230 10/05/15 2331 10/06/15 0007  BP: 169/118 187/120 168/106 169/105  Pulse: 82 73  66  Temp: 97.8 F (36.6 C)     TempSrc: Oral     Resp: Height:      Weight: 63.957 kg (141 lb)     SpO2: 98% 98%  99%   Wt Readings from Last 3 Encounters:  10/05/15 63.957 kg (141 lb)  09/22/15 56.246 kg (124 lb)  09/13/15 58.968 kg (130 lb)    PHYSICAL EXAMINATION:  Physical Exam  Vitals  reviewed. Constitutional: She is oriented to person, place, and time. She appears well-developed and well-nourished. No distress.  HENT:  Head: Normocephalic and atraumatic.  Mouth/Throat: Oropharynx is clear and moist.  Eyes: Conjunctivae and EOM are normal. Pupils are equal, round, and reactive to light. No scleral icterus.  Neck: Normal range of motion. Neck supple. No JVD present. No thyromegaly present.  Cardiovascular: Normal rate, regular rhythm and intact distal pulses.  Exam reveals no gallop and no friction rub.   No murmur heard. Respiratory: Effort normal and breath sounds normal. No respiratory distress. She has no wheezes. She has no rales.  GI: Soft. Bowel sounds are normal. She exhibits no distension. There is no tenderness.  Musculoskeletal: Normal range of motion. She exhibits no edema.  No arthritis, no gout  Lymphadenopathy:    She has no cervical adenopathy.  Neurological: She is alert and oriented to person, place, and time. No cranial nerve deficit.  No dysarthria, no aphasia  Skin: Skin is warm and dry. No rash noted. There is erythema.  Oblong ulcer with surrounding erythema and central drainage with a small area of eschar on her medial and distal left lower extremity  Psychiatric: She has a normal mood and affect. Her behavior is normal. Judgment and thought content normal.    LABORATORY PANEL:   CBC  Recent Labs Lab 10/05/15 1804  WBC 12.8*  HGB 11.6*  HCT 33.7*  PLT 228   ------------------------------------------------------------------------------------------------------------------  Chemistries   Recent Labs Lab 10/05/15 1804  NA 138  K 3.7  CL 107  CO2 22  GLUCOSE 103*  BUN 12  CREATININE 0.78  CALCIUM 9.2  AST 35  ALT 25  ALKPHOS 105  BILITOT 0.4   ------------------------------------------------------------------------------------------------------------------  Cardiac Enzymes No results for input(s): TROPONINI in the last 168  hours. ------------------------------------------------------------------------------------------------------------------  RADIOLOGY:  Ct Tibia Fibula Left W Contrast  10/05/2015  CLINICAL DATA:  Cellulitis. Wound with purulent drainage. Patient reports left leg wound for 3 weeks, recent injury. EXAM: CT OF THE LEFT TIBIA AND FIBULA WITH CONTRAST TECHNIQUE: Axial images of the left tibia/ fibula after the IV administration of contrast. Multiplanar reformats. CONTRAST:  ISOVUE-300 IOPAMIDOL (ISOVUE-300) INJECTION 61% COMPARISON:  Radiographs 09/13/2015, right lower extremity duplex 09/22/2015 FINDINGS: Skin thickening and soft tissue edema about the distal medial right lower leg. There is within oblong fluid collection measuring 5.7 x 1.1 x 1.1 cm in the region of skin thickening. Some peripheral enhancement. The medial border is ill-defined with probable extension to a small skin defect about the superior aspect of the collection. Single internal focus of air without tracking air to suggest necrotizing infection. No additional fluid collection. No evidence of intramuscular extension. No periosteal reaction or bony destructive change of the subjacent tibia. No acute fracture. IMPRESSION:  Skin thickening and subcutaneous edema of the distal medial lower leg consistent with cellulitis. Thin oblong fluid collection deep and distal to a skin defect is elongated measuring 5.7 x 1.1 x 1.1 cm, likely ill-defined abscess, less likely hematoma in the setting of injury. Electronically Signed   By: Rubye Oaks M.D.   On: 10/05/2015 23:28    EKG:   Orders placed or performed in visit on 12/13/07  . EKG 12-Lead    IMPRESSION AND PLAN:  Principal Problem:   Sepsis (HCC) - hemodynamically stable, lactic acid was normal, broad IV antibiotics started in the ED, and cultures sent Active Problems:   Cellulitis and abscess - IV antibiotics as above, we will also place a wound consult for recommendations on  caring for her ulcer   Anxiety - continue home anxiolytics   Asthma - patient is not on a medications at home for this. We'll monitor her closely and treat when necessary for symptoms.  All the records are reviewed and case discussed with ED provider. Management plans discussed with the patient and/or family.  DVT PROPHYLAXIS: SubQ lovenox  GI PROPHYLAXIS: None  ADMISSION STATUS: Inpatient  CODE STATUS: Full Code Status History    This patient does not have a recorded code status. Please follow your organizational policy for patients in this situation.      TOTAL TIME TAKING CARE OF THIS PATIENT: 45 minutes.    Malaysia Crance FIELDING 10/06/2015, 1:41 AM  [facility]  Office  [phone]  CC: Primary care physician; [facility]

## 2015-10-06 NOTE — ED Provider Notes (Signed)
I Assumed care of the patient had 11:00 PM from Dr. Langston MaskerShaevitz. I examined patient which revealed 1 x 3 cm ulcerated lesion left medial leg with central necrotic tissue with surrounding blanching erythema. As per Dr. Langston MaskerShaevitz recommendation patient will be admitted to the hospital for further IV antibiotic therapy  Darci Currentandolph N Joanathan Affeldt, MD 10/06/15 [phone]
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
## 2015-10-06 NOTE — Consult Note (Signed)
WOC wound consult note Reason for Consult: Traumatic injury to left medial lower leg.  Resulting cellulitis.  Wound type:Traumatic injury > 2 weeks  Pressure Ulcer POA: N/A Measurement: 2.4 cm x 3.2 cm x 0.2 cm  Wound bed:50% devitalized, dark tissue.   Drainage (amount, consistency, odor) Moderate purulent drainage Periwound:Erythema extending 2 cm circumferentially.  Tender to touch.  Dressing procedure/placement/frequency:Cleanse wound to left lower leg with NS and pat gently dry.  Apply Santyl ointment to wound bed. Cover with NS moist gauze.  Secure with 4x4 gauze and kerlix.  Change daily.  Send medication home with patient for continued use.  Will not follow at this time.  Please re-consult if needed.  Maple HudsonKaren Cystal Shannahan RN BSN CWON Pager [phone]

## 2015-10-06 NOTE — Plan of Care (Signed)
Text DR. To inform about vitals inconsistent with pt baseline.

## 2015-10-06 NOTE — Progress Notes (Signed)
Dressing changed per MD order.   

## 2015-10-07 LAB — VANCOMYCIN, TROUGH: Vancomycin Tr: 17 ug/mL (ref 10–20)

## 2015-10-07 MED ORDER — ALUM & MAG HYDROXIDE-SIMETH 200-200-20 MG/5ML PO SUSP
15.0000 mL | ORAL | Status: DC | PRN
  Administered 2015-10-07: 15 mL via ORAL
  Filled 2015-10-07: qty 30

## 2015-10-07 MED ORDER — OXYCODONE-ACETAMINOPHEN 5-325 MG PO TABS
1.0000 | ORAL_TABLET | Freq: Four times a day (QID) | ORAL | Status: DC | PRN
Start: 2015-10-07 — End: 2015-10-08
  Administered 2015-10-07 (×3): 2 via ORAL
  Administered 2015-10-08: 1 via ORAL
  Filled 2015-10-07: qty 1
  Filled 2015-10-07 (×3): qty 2

## 2015-10-07 NOTE — Progress Notes (Signed)
[facility] - [facility] - [place] at [facility]   PATIENT NAME: Nicole [name]    MR#:  [id number]  DATE OF BIRTH:  1960-08-05  SUBJECTIVE: 55 year old female patient admitted for left leg cellulitis. Patient was seen in the emergency room on March 23. Patient swelling of the left leg is better than yesterday. No fever. Seen by surgery. Has lot of pain in the left leg.   CHIEF COMPLAINT:   Chief Complaint  Patient presents with  . Wound Infection    REVIEW OF SYSTEMS:    Review of Systems  Constitutional: Negative for fever and chills.  HENT: Negative for hearing loss.   Eyes: Negative for blurred vision, double vision and photophobia.  Respiratory: Negative for cough, hemoptysis and shortness of breath.   Cardiovascular: Negative for palpitations, orthopnea and leg swelling.  Gastrointestinal: Negative for vomiting, abdominal pain and diarrhea.  Genitourinary: Negative for dysuria and urgency.  Musculoskeletal: Negative for myalgias and neck pain.       Left leg pain, redness, drainage  Skin: Negative for rash.  Neurological: Negative for dizziness, focal weakness, seizures, weakness and headaches.  Psychiatric/Behavioral: Negative for memory loss. The patient does not have insomnia.     Nutrition:  Tolerating Diet: Tolerating PT:      DRUG ALLERGIES:  No Known Allergies  VITALS:  Blood pressure 104/78, pulse 66, temperature 98 F (36.7 C), temperature source Oral, resp. rate 20, height  (1.6 m), weight 63.957 kg (141 lb), SpO2 98 %.  PHYSICAL EXAMINATION:   Physical Exam  GENERAL:  55 y.o.-year-old patient lying in the bed with no acute distress.  EYES: Pupils equal, round, reactive to light and accommodation. No scleral icterus. Extraocular muscles intact.  HEENT: Head atraumatic, normocephalic. Oropharynx and nasopharynx clear.  NECK:  Supple, no jugular venous distention. No thyroid enlargement, no tenderness.  LUNGS: Normal breath sounds  bilaterally, no wheezing, rales,rhonchi or crepitation. No use of accessory muscles of respiration.  CARDIOVASCULAR: S1, S2 normal. No murmurs, rubs, or gallops.  ABDOMEN: Soft, nontender, nondistended. Bowel sounds present. No organomegaly or mass.  EXTREMITIES;eft leg is edematous,tender to palpation. open areas or drainage observed and the lower part of the leg  With pus. Moderate purulent drainage is present  :NEUROLOGIC: Cranial nerves II through XII are intact. Muscle strength 5/5 in all extremities. Sensation intact. Gait not checked.  PSYCHIATRIC: The patient is alert and oriented x 3.  SKIN: No obvious rash, lesion, or ulcer.    LABORATORY PANEL:   CBC  Recent Labs Lab 10/06/15 0530  WBC 5.8  HGB 11.7*  HCT 33.7*  PLT 192   ------------------------------------------------------------------------------------------------------------------  Chemistries   Recent Labs Lab 10/05/15 1804 10/06/15 0530  NA 138 139  K 3.7 3.5  CL 107 112*  CO2 22 23  GLUCOSE 103* 101*  BUN 12 9  CREATININE 0.78 0.69  CALCIUM 9.2 8.7*  AST 35  --   ALT 25  --   ALKPHOS 105  --   BILITOT 0.4  --    ------------------------------------------------------------------------------------------------------------------  Cardiac Enzymes No results for input(s): TROPONINI in the last 168 hours. ------------------------------------------------------------------------------------------------------------------  RADIOLOGY:  Ct Tibia Fibula Left W Contrast  10/05/2015  CLINICAL DATA:  Cellulitis. Wound with purulent drainage. Patient reports left leg wound for 3 weeks, recent injury. EXAM: CT OF THE LEFT TIBIA AND FIBULA WITH CONTRAST TECHNIQUE: Axial images of the left tibia/ fibula after the IV administration of contrast. Multiplanar reformats. CONTRAST:  ISOVUE-300 IOPAMIDOL (ISOVUE-300) INJECTION 61%  COMPARISON:  Radiographs 09/13/2015, right lower extremity duplex 09/22/2015 FINDINGS: Skin  thickening and soft tissue edema about the distal medial right lower leg. There is within oblong fluid collection measuring 5.7 x 1.1 x 1.1 cm in the region of skin thickening. Some peripheral enhancement. The medial border is ill-defined with probable extension to a small skin defect about the superior aspect of the collection. Single internal focus of air without tracking air to suggest necrotizing infection. No additional fluid collection. No evidence of intramuscular extension. No periosteal reaction or bony destructive change of the subjacent tibia. No acute fracture. IMPRESSION: Skin thickening and subcutaneous edema of the distal medial lower leg consistent with cellulitis. Thin oblong fluid collection deep and distal to a skin defect is elongated measuring 5.7 x 1.1 x 1.1 cm, likely ill-defined abscess, less likely hematoma in the setting of injury. Electronically Signed   By: Rubye OaksMelanie  Ehinger M.D.   On: 10/05/2015 23:28     ASSESSMENT AND PLAN:   Principal Problem:   Sepsis (HCC) Active Problems:   Cellulitis and abscess   Anxiety   Asthma   Cellulitis  #1 sepsis present on admission: Due to left leg cellulitis 'and abscess:: Continue IV by antibiotics, patient is on IV vancomycin, Zosyn. Wound cultures are pending ,blood cultures are negative. Seen by wound care people, dressing  changes with Santyl continue by mouth Percocet only,avoid  IV morphine at this time for pain control. Needs IV antibiotics for at least another 24 hours because of persistent drainage from the wound. #2 .history of tobacco abuse: No wheezing at this time. Continue nicotine patch, controlled. #3. depression: We'll continue Seroquel, Cymbalta, 4.essential hypertension: Patient started on metoprolol 50 mg by mouth twice a day. 5.History of anxiety: Continue Klonopin. All the records are reviewed and case discussed with Care Management/Social Workerr. Management plans discussed with the patient, family and they are  in agreement.  CODE STATUS: full  TOTAL TIME TAKING CARE OF THIS PATIENT:1835minutes.   POSSIBLE D/C IN 1-2 DAYS, DEPENDING ON CLINICAL CONDITION.   Katha HammingKONIDENA,Saba Neuman M.D on 10/07/2015 at 11:22 AM  Between 7am to 6pm - Pager - [phone]  After 6pm go to [email or web address] - password [email or web address] [facility]  [facility]  Office  [phone]  CC: Primary care physician; [facility]

## 2015-10-07 NOTE — Progress Notes (Signed)
Pharmacy Antibiotic Note  Nicole OlszewskiKimberly Dawn [name] is a 55 y.o. female admitted on 10/05/2015 with cellulitis.  Pharmacy has been consulted for Vancomycin and Zosyn dosing.  Plan: Vancomycin 1g IV every 12 hours.  Goal trough 15-20 mcg/mL. Zosyn 3.375g IV q8h (4 hour infusion).  Height: 5\' 3"  (160 cm) Weight: 141 lb (63.957 kg) IBW/kg (Calculated) : 52.4  Temp (24hrs), Avg:98.1 F (36.7 C), Min:97.6 F (36.4 C), Max:98.9 F (37.2 C)   Recent Labs Lab 10/05/15 1804 10/05/15 2231 10/06/15 0134 10/06/15 0530 10/07/15 1716  WBC 12.8*  --   --  5.8  --   CREATININE 0.78  --   --  0.69  --   LATICACIDVEN  --  0.8 1.0  --   --   VANCOTROUGH  --   --   --   --  17    Estimated Creatinine Clearance: 74 mL/min (by C-G formula based on Cr of 0.69).    No Known Allergies  Antimicrobials this admission: Vancomycin 4/6 >>  Zosyn 4/6 >>   Dose adjustments this admission: 4/7 15:15 Vanc trough of 6017mcg/ml  Microbiology results: 4/5 BCx: no growth 4/7 WoundCx:    Thank you for allowing pharmacy to be a part of this patient's care.  Nicole CuffLisa Labrittany [name], PharmD, BCPS 10/07/2015 6:29 PM

## 2015-10-08 DIAGNOSIS — L03116 Cellulitis of left lower limb: Principal | ICD-10-CM

## 2015-10-08 MED ORDER — OXYCODONE-ACETAMINOPHEN 5-325 MG PO TABS
1.0000 | ORAL_TABLET | ORAL | Status: DC | PRN
  Administered 2015-10-08 – 2015-10-09 (×5): 2 via ORAL
  Administered 2015-10-09: 1 via ORAL
  Filled 2015-10-08 (×6): qty 2

## 2015-10-08 MED ORDER — METOPROLOL TARTRATE 25 MG PO TABS
12.5000 mg | ORAL_TABLET | Freq: Two times a day (BID) | ORAL | Status: DC
  Administered 2015-10-08 – 2015-10-09 (×3): 12.5 mg via ORAL
  Filled 2015-10-08 (×3): qty 1

## 2015-10-08 NOTE — Progress Notes (Signed)
And wound on her left leg.  No major complaints. Afebrile  Physical examination: No acute distress awake alert Ext: There is 2 small ulcerations and some fibrinous exudate and minimal erythema and no evidence of necrotizing infection  A/P  antibiotic therapy and local wound care. We'll be available. We may F/u as outpt in 1-2 weeks

## 2015-10-08 NOTE — Progress Notes (Signed)
Patient's wound cx is growing gram (-) rods. MD made aware.

## 2015-10-08 NOTE — Progress Notes (Signed)
[facility] - [place] at [facility]   PATIENT NAME: Nicole [name]    MR#:  [id number]  DATE OF BIRTH:  Nov 10, 1960  SUBJECTIVE:   CHIEF COMPLAINT:   Chief Complaint  Patient presents with  . Wound Infection   Continues to complain of pain and left leg. Afebrile. Good appetite. Able to ambulate.  REVIEW OF SYSTEMS:    Review of Systems  Constitutional: Negative for fever and chills.  HENT: Negative for hearing loss.   Eyes: Negative for blurred vision, double vision and photophobia.  Respiratory: Negative for cough, hemoptysis and shortness of breath.   Cardiovascular: Negative for palpitations, orthopnea and leg swelling.  Gastrointestinal: Negative for vomiting, abdominal pain and diarrhea.  Genitourinary: Negative for dysuria and urgency.  Musculoskeletal: Negative for myalgias and neck pain.       Left leg pain, redness, drainage  Skin: Negative for rash.  Neurological: Negative for dizziness, focal weakness, seizures, weakness and headaches.  Psychiatric/Behavioral: Negative for memory loss. The patient does not have insomnia.      DRUG ALLERGIES:  No Known Allergies  VITALS:  Blood pressure 150/89, pulse 61, temperature 97.4 F (36.3 C), temperature source Oral, resp. rate 16, height  (1.6 m), weight 63.957 kg (141 lb), SpO2 96 %.  PHYSICAL EXAMINATION:   Physical Exam  GENERAL:  55 y.o.-year-old patient lying in the bed with no acute distress.  EYES: Pupils equal, round, reactive to light and accommodation. No scleral icterus. Extraocular muscles intact.  HEENT: Head atraumatic, normocephalic. Oropharynx and nasopharynx clear.  NECK:  Supple, no jugular venous distention. No thyroid enlargement, no tenderness.  LUNGS: Normal breath sounds bilaterally, no wheezing, rales,rhonchi or crepitation. No use of accessory muscles of respiration.  CARDIOVASCULAR: S1, S2 normal. No murmurs, rubs, or gallops.  ABDOMEN: Soft, nontender,  nondistended. Bowel sounds present. No organomegaly or mass.  EXTREMITIES - Small irregular ulcer with tenderness and serous discharge left leg :NEUROLOGIC: Cranial nerves II through XII are intact. Muscle strength 5/5 in all extremities. Sensation intact. Gait not checked.  PSYCHIATRIC: The patient is alert and oriented x 3.  SKIN: No obvious rash, lesion, or ulcer.    LABORATORY PANEL:   CBC  Recent Labs Lab 10/06/15 0530  WBC 5.8  HGB 11.7*  HCT 33.7*  PLT 192   ------------------------------------------------------------------------------------------------------------------  Chemistries   Recent Labs Lab 10/05/15 1804 10/06/15 0530  NA 138 139  K 3.7 3.5  CL 107 112*  CO2 22 23  GLUCOSE 103* 101*  BUN 12 9  CREATININE 0.78 0.69  CALCIUM 9.2 8.7*  AST 35  --   ALT 25  --   ALKPHOS 105  --   BILITOT 0.4  --    ------------------------------------------------------------------------------------------------------------------  Cardiac Enzymes No results for input(s): TROPONINI in the last 168 hours. ------------------------------------------------------------------------------------------------------------------  RADIOLOGY:  No results found.   ASSESSMENT AND PLAN:   Principal Problem:   Sepsis (HCC) Active Problems:   Cellulitis and abscess   Anxiety   Asthma   Cellulitis   # Left leg cellulitis with ulceration Sepsis present on admission has resolved Continue IV antibiotics. Wound cultures have gram-negative rods. Continue Zosyn and stop vancomycin. Date for final sensitivities to decide on discharge antibiotics. Possible discharge in 1-2 days. Appreciate surgery following patient.  # depression continue Seroquel, Cymbalta,  # Essential hypertension Started metoprolol  # Anxiety: Continue Klonopin.  All the records are reviewed and case discussed with Care Management/Social Workerr. Management plans discussed with the patient,  family and  they are in agreement.  CODE STATUS: full  TOTAL TIME TAKING CARE OF THIS PATIENT:6135minutes.   POSSIBLE D/C IN 1-2 DAYS, DEPENDING ON CLINICAL CONDITION.   Milagros LollSudini, Ryhanna Dunsmore R M.D on 10/08/2015 at 12:29 PM  Between 7am to 6pm - Pager - [phone]  After 6pm go to [email or web address] - password [email or web address] [facility]  [facility]  Office  [phone]  CC: Primary care physician; [facility]

## 2015-10-09 LAB — CREATININE, SERUM
Creatinine, Ser: 0.93 mg/dL (ref 0.44–1.00)
GFR calc non Af Amer: >60 mL/min (ref >60)

## 2015-10-09 MED ORDER — OXYCODONE-ACETAMINOPHEN 5-325 MG PO TABS: 1.0000 | ORAL_TABLET | Freq: Four times a day (QID) | ORAL | Status: DC | PRN

## 2015-10-09 MED ORDER — AMOXICILLIN-POT CLAVULANATE 875-125 MG PO TABS: 1.0000 | ORAL_TABLET | Freq: Two times a day (BID) | ORAL | Status: DC

## 2015-10-09 NOTE — Progress Notes (Signed)
Patient discharged home in stable condition at 1500. Family provided transportation. Patient instructed about wound care using teach back method.

## 2015-10-09 NOTE — Discharge Instructions (Addendum)
°  DIET:  Cardiac diet  DISCHARGE CONDITION:  Stable  ACTIVITY:  Activity as tolerated  OXYGEN:  Home Oxygen: No.   Oxygen Delivery: room air  DISCHARGE LOCATION:  home   If you experience worsening of your admission symptoms, develop shortness of breath, life threatening emergency, suicidal or homicidal thoughts you must seek medical attention immediately by calling 911 or calling your MD immediately  if symptoms less severe.  You Must read complete instructions/literature along with all the possible adverse reactions/side effects for all the Medicines you take and that have been prescribed to you. Take any new Medicines after you have completely understood and accpet all the possible adverse reactions/side effects.   Please note  You were cared for by a hospitalist during your hospital stay. If you have any questions about your discharge medications or the care you received while you were in the hospital after you are discharged, you can call the unit and asked to speak with the hospitalist on call if the hospitalist that took care of you is not available. Once you are discharged, your primary care physician will handle any further medical issues. Please note that NO REFILLS for any discharge medications will be authorized once you are discharged, as it is imperative that you return to your primary care physician (or establish a relationship with a primary care physician if you do not have one) for your aftercare needs so that they can reassess your need for medications and monitor your lab values.  Wound care Clean with normal saline, gently dry, apply thin film of santyl, cover with saline moist guaze and kerlex. Change dressing daily.

## 2015-10-09 NOTE — Progress Notes (Signed)
LCSW as per Care Manager required some additional resources for supportive counseling. LCSW met with patient who denied spousal abuse both verbal or physical but could benefit from extra help. LCSW reviewed [employer] and recommended [facility] and provided handouts. Patient disclosed she goes already to [facility] in [place] but extra support would be good. No further needs  [employer] LCSW [phone]

## 2015-10-09 NOTE — Care Management Note (Addendum)
Case Management Note  Patient Details  Name: Lula OlszewskiKimberly Dawn Folkes MRN: [id number] Date of Birth: 08/30/1960  Subjective/Objective:    [facility] was told that for adult Medicaid patients the cost is $3.00 per medication. Discussed Silvadene dressing and home dressing care with Adline MangoIvana, RN, who will clarify home self dressing changes and Silvadene administration with Ms Jacquet. No home health services orders.                Action/Plan:   Expected Discharge Date:                  Expected Discharge Plan:     In-House Referral:     Discharge planning Services  CM Consult  Post Acute Care Choice:    Choice offered to:  Patient  DME Arranged:    DME Agency:     HH Arranged:    HH Agency:     Status of Service:  Completed, signed off  Medicare Important Message Given:    Date Medicare IM Given:    Medicare IM give by:    Date Additional Medicare IM Given:    Additional Medicare Important Message give by:     If discussed at Long Length of Stay Meetings, dates discussed:    Additional Comments:  Layton Tappan A, RN 10/09/2015, 10:15 AM

## 2015-10-10 LAB — CULTURE, BLOOD (ROUTINE X 2)
CULTURE: NO GROWTH
Culture: NO GROWTH

## 2015-10-10 LAB — WOUND CULTURE: Special Requests: NORMAL

## 2015-10-12 NOTE — Discharge Summary (Signed)
[facility] - [place] at [facility]   PATIENT NAME: Nicole [name]    MR#:  [id number]  DATE OF BIRTH:  07-05-1960  DATE OF ADMISSION:  10/05/2015 ADMITTING PHYSICIAN: Oralia Manis, MD  DATE OF DISCHARGE: 10/09/2015  4:50 PM  PRIMARY CARE PHYSICIAN: [facility]   ADMISSION DIAGNOSIS:  Cellulitis [L03.90]  DISCHARGE DIAGNOSIS:  Principal Problem:   Sepsis (HCC) Active Problems:   Cellulitis and abscess   Anxiety   Asthma   Cellulitis   SECONDARY DIAGNOSIS:   Past Medical History  Diagnosis Date  . Asthma   . Collagen vascular disease (HCC)   . Anxiety      ADMITTING HISTORY  Nicole [name] is a 55 y.o. female who presents with left lower extremity wound infection and cellulitis. Patient states that a table fell on her leg couple weeks ago she developed subsequent injury to the leg with some blistering which then led to an ulcerated lesion. She came to have it looked at, and was prescribed a topical antibiotic ointment. She states that it never really got much better, and about 4 days ago he began to grow red around the borders and had significant drainage. She tried to clean it out with peroxide multiple times a day, with no improvement. She came to the ED for evaluation today. Here she was found to be in sepsis with  HOSPITAL COURSE:   # Left leg cellulitis with ulceration Sepsis present on admission has resolved Continued IV antibiotics during hospitalization Wound cultures have gram-negative rods. Patient seen by surgery and no I&D recommended. Start home on Augmentin.  # depression continue Seroquel, Cymbalta,  # Elevated blood pressure without diagnosis of hypertension Blood pressure periodically with likely due to pain.  # Anxiety: Continue Klonopin.  Stable for discharge home  CONSULTS OBTAINED:     DRUG ALLERGIES:  No Known Allergies  DISCHARGE MEDICATIONS:   Discharge Medication List as of 10/09/2015  3:26 PM     START taking these medications   Details  amoxicillin-clavulanate (AUGMENTIN) 875-125 MG tablet Take 1 tablet by mouth 2 (two) times daily., Starting 10/09/2015, Until Discontinued, Print    oxyCODONE-acetaminophen (PERCOCET/ROXICET) 5-325 MG tablet Take 1 tablet by mouth every 6 (six) hours as needed for moderate pain or severe pain., Starting 10/09/2015, Until Discontinued, Print      CONTINUE these medications which have NOT CHANGED   Details  BELSOMRA 15 MG TABS Take 1 tablet by mouth daily., Starting 09/16/2015, Until Discontinued, Historical Med    clonazePAM (KLONOPIN) 1 MG tablet Take 1 mg by mouth 2 (two) times daily., Until Discontinued, Historical Med    DULoxetine (CYMBALTA) 30 MG capsule Take 30 mg by mouth daily., Until Discontinued, Historical Med    gabapentin (NEURONTIN) 300 MG capsule Take 300 mg by mouth 3 (three) times daily., Until Discontinued, Historical Med    Ibuprofen-Diphenhydramine Cit (ADVIL PM PO) Take 1 tablet by mouth every evening., Until Discontinued, Historical Med    QUEtiapine (SEROQUEL) 300 MG tablet Take 300 mg by mouth at bedtime., Until Discontinued, Historical Med        Today   VITAL SIGNS:  Blood pressure 148/82, pulse 75, temperature 98 F (36.7 C), temperature source Oral, resp. rate 16, height  (1.6 m), weight 63.957 kg (141 lb), SpO2 100 %.  I/O:  No intake or output data in the 24 hours ending 10/12/15 1420  PHYSICAL EXAMINATION:  Physical Exam  GENERAL:  55 y.o.-year-old patient lying in the  bed with no acute distress.  LUNGS: Normal breath sounds bilaterally, no wheezing, rales,rhonchi or crepitation. No use of accessory muscles of respiration.  CARDIOVASCULAR: S1, S2 normal. No murmurs, rubs, or gallops.  ABDOMEN: Soft, non-tender, non-distended. Bowel sounds present. No organomegaly or mass.  NEUROLOGIC: Moves all 4 extremities. PSYCHIATRIC: The patient is alert and oriented x 3.  SKIN: Ulcer left leg without any  discharge or redness.  DATA REVIEW:   CBC  Recent Labs Lab 10/06/15 0530  WBC 5.8  HGB 11.7*  HCT 33.7*  PLT 192    Chemistries   Recent Labs Lab 10/05/15 1804 10/06/15 0530 10/09/15 0400  NA 138 139  --   K 3.7 3.5  --   CL 107 112*  --   CO2 22 23  --   GLUCOSE 103* 101*  --   BUN 12 9  --   CREATININE 0.78 0.69 0.93  CALCIUM 9.2 8.7*  --   AST 35  --   --   ALT 25  --   --   ALKPHOS 105  --   --   BILITOT 0.4  --   --     Cardiac Enzymes No results for input(s): TROPONINI in the last 168 hours.  Microbiology Results  Results for orders placed or performed during the hospital encounter of 10/05/15  Blood Culture (routine x 2)     Status: None   Collection Time: 10/05/15 10:31 PM  Result Value Ref Range Status   Specimen Description BLOOD LEFT ARM  Final   Special Requests BOTTLES DRAWN AEROBIC AND ANAEROBIC 7ML  Final   Culture NO GROWTH 5 DAYS  Final   Report Status 10/10/2015 FINAL  Final  Blood Culture (routine x 2)     Status: None   Collection Time: 10/05/15 10:31 PM  Result Value Ref Range Status   Specimen Description BLOOD RIGHT WRIST  Final   Special Requests BOTTLES DRAWN AEROBIC AND ANAEROBIC 7ML  Final   Culture NO GROWTH 5 DAYS  Final   Report Status 10/10/2015 FINAL  Final  Wound culture     Status: None   Collection Time: 10/07/15 12:38 PM  Result Value Ref Range Status   Specimen Description LL  Final   Special Requests Normal  Final   Gram Stain   Final    RARE WBC SEEN FEW GRAM POSITIVE RODS RARE GRAM NEGATIVE RODS    Culture HEAVY GROWTH ENTEROBACTER CLOACAE  Final   Report Status 10/10/2015 FINAL  Final   Organism ID, Bacteria ENTEROBACTER CLOACAE  Final      Susceptibility   Enterobacter cloacae - MIC*    CEFAZOLIN Value in next row Resistant      RESISTANT>=64    CEFEPIME Value in next row Sensitive      SENSITIVE<=1    CEFTAZIDIME Value in next row Sensitive      SENSITIVE<=1    CEFTRIAXONE Value in next row  Sensitive      SENSITIVE<=1    CIPROFLOXACIN Value in next row Sensitive      SENSITIVE<=0.25    GENTAMICIN Value in next row Sensitive      SENSITIVE<=1    IMIPENEM Value in next row Sensitive      SENSITIVE<=0.25    TRIMETH/SULFA Value in next row Sensitive      SENSITIVE<=20    PIP/TAZO Value in next row Sensitive      SENSITIVE<=4    * HEAVY GROWTH ENTEROBACTER CLOACAE  RADIOLOGY:  No results found.  Follow up with PCP in 1 week.  Management plans discussed with the patient, family and they are in agreement.  CODE STATUS:  Code Status History    Date Active Date Inactive Code Status Order ID Comments User Context   10/06/2015  3:49 AM 10/09/2015  7:50 PM Full Code [id number]  Oralia Manis, MD Inpatient      TOTAL TIME TAKING CARE OF THIS PATIENT ON DAY OF DISCHARGE: more than 30 minutes.   Milagros Loll R M.D on 10/12/2015 at 2:20 PM  Between 7am to 6pm - Pager - [phone]  After 6pm go to [email or web address] - password [email or web address] [facility]  [facility] [place] [facility]  Office  [phone]  CC: Primary care physician; [facility]  Note: This dictation was prepared with Dragon dictation along with smaller phrase technology. Any transcriptional errors that result from this process are unintentional.

## 2015-10-13 ENCOUNTER — Emergency Department
Admission: EM | Admit: 2015-10-13 | Discharge: 2015-10-13 | Disposition: A | Discharge status: Home / Self Care | Payer: Medicaid Other | Attending: Emergency Medicine | Admitting: Emergency Medicine

## 2015-10-13 ENCOUNTER — Encounter: Payer: Self-pay | Admitting: Emergency Medicine

## 2015-10-13 DIAGNOSIS — L98499 Non-pressure chronic ulcer of skin of other sites with unspecified severity: Secondary | ICD-10-CM | POA: Insufficient documentation

## 2015-10-13 DIAGNOSIS — J45909 Unspecified asthma, uncomplicated: Secondary | ICD-10-CM | POA: Diagnosis not present

## 2015-10-13 DIAGNOSIS — F419 Anxiety disorder, unspecified: Secondary | ICD-10-CM | POA: Diagnosis not present

## 2015-10-13 DIAGNOSIS — F17200 Nicotine dependence, unspecified, uncomplicated: Secondary | ICD-10-CM | POA: Insufficient documentation

## 2015-10-13 LAB — BASIC METABOLIC PANEL
ANION GAP: 5 (ref 5–15)
BUN: 19 mg/dL (ref 6–20)
CALCIUM: 9.6 mg/dL (ref 8.9–10.3)
CHLORIDE: 105 mmol/L (ref 101–111)
CO2: 25 mmol/L (ref 22–32)
Creatinine, Ser: 0.99 mg/dL (ref 0.44–1.00)
GFR calc non Af Amer: >60 mL/min (ref >60)
Glucose, Bld: 93 mg/dL (ref 65–99)
Potassium: 4.2 mmol/L (ref 3.5–5.1)
Sodium: 135 mmol/L (ref 135–145)

## 2015-10-13 LAB — CBC WITH DIFFERENTIAL/PLATELET
BASOS ABS: 0.1 10*3/uL (ref 0–0.1)
BASOS PCT: 1 %
Eosinophils Absolute: 0.4 10*3/uL (ref 0–0.7)
Eosinophils Relative: 5 %
HEMATOCRIT: 34.5 % — AB (ref 35.0–47.0)
HEMOGLOBIN: 11.7 g/dL — AB (ref 12.0–16.0)
Lymphocytes Relative: 36 %
Lymphs Abs: 2.9 10*3/uL (ref 1.0–3.6)
MCH: 34.7 pg — ABNORMAL HIGH (ref 26.0–34.0)
MCHC: 34 g/dL (ref 32.0–36.0)
MCV: 102.1 fL — ABNORMAL HIGH (ref 80.0–100.0)
Monocytes Absolute: 0.6 10*3/uL (ref 0.2–0.9)
Monocytes Relative: 8 %
NEUTROS ABS: 4.2 10*3/uL (ref 1.4–6.5)
NEUTROS PCT: 50 %
Platelets: 208 10*3/uL (ref 150–440)
RBC: 3.38 MIL/uL — ABNORMAL LOW (ref 3.80–5.20)
RDW: 15.2 % — ABNORMAL HIGH (ref 11.5–14.5)
WBC: 8.2 10*3/uL (ref 3.6–11.0)

## 2015-10-13 NOTE — ED Provider Notes (Signed)
[facility] Emergency Department Provider Note    ____________________________________________  Time seen: ~2035  I have reviewed the triage vital signs and the nursing notes.   HISTORY  Chief Complaint Wound Infection   History limited by: Not Limited   HPI Nicole [name] is a 55 y.o. female who presents to the emergency department today because of concerns for continued wound to her left foot. The patient recently had an inpatient admission for sepsis secondary to this wound. She states that since that time she has had continued pain to that area. She has had low-grade temperatures as high as 99.2. She denies any nausea or vomiting. She states she has tried the tramadol without any great relief. The patient states that she has not contacted her primary care for follow-up at this point. She states she has been taking the Augmentin that was prescribed to her.    Past Medical History  Diagnosis Date  . Asthma   . Collagen vascular disease (HCC)   . Anxiety     Patient Active Problem List   Diagnosis Date Noted  . Cellulitis and abscess 10/06/2015  . Sepsis (HCC) 10/06/2015  . Anxiety 10/06/2015  . Asthma 10/06/2015  . Cellulitis     Past Surgical History  Procedure Laterality Date  . Foot surgery    . Abdominal hysterectomy      Current Outpatient Rx  Name  Route  Sig  Dispense  Refill  . amoxicillin-clavulanate (AUGMENTIN) 875-125 MG tablet   Oral   Take 1 tablet by mouth 2 (two) times daily.   14 tablet   0   . BELSOMRA 15 MG TABS   Oral   Take 1 tablet by mouth daily.      0     Dispense as written.   . clonazePAM (KLONOPIN) 1 MG tablet   Oral   Take 1 mg by mouth 2 (two) times daily.         . DULoxetine (CYMBALTA) 30 MG capsule   Oral   Take 30 mg by mouth daily.         Marland Kitchen. gabapentin (NEURONTIN) 300 MG capsule   Oral   Take 300 mg by mouth 3 (three) times daily.         . Ibuprofen-Diphenhydramine Cit  (ADVIL PM PO)   Oral   Take 1 tablet by mouth every evening.         Marland Kitchen. oxyCODONE-acetaminophen (PERCOCET/ROXICET) 5-325 MG tablet   Oral   Take 1 tablet by mouth every 6 (six) hours as needed for moderate pain or severe pain.   20 tablet   0   . QUEtiapine (SEROQUEL) 300 MG tablet   Oral   Take 300 mg by mouth at bedtime.           Allergies Review of patient's allergies indicates no known allergies.  Family History  Problem Relation Age of Onset  . Rheum arthritis    . Osteoporosis    . Clotting disorder    . Stroke      Social History Social History  Substance Use Topics  . Smoking status: Current Every Day Smoker  . Smokeless tobacco: None  . Alcohol Use: No    Review of Systems  Constitutional: Negative for fever. Cardiovascular: Negative for chest pain. Respiratory: Negative for shortness of breath. Gastrointestinal: Negative for abdominal pain, vomiting and diarrhea. Neurological: Negative for headaches, focal weakness or numbness.  10-point ROS otherwise negative.  ____________________________________________  PHYSICAL EXAM:  VITAL SIGNS: ED Triage Vitals  Enc Vitals Group     BP 10/13/15 1932 158/92 mmHg     Pulse Rate 10/13/15 1932 91     Resp --      Temp 10/13/15 1932 98.5 F (36.9 C)     Temp Source 10/13/15 1932 Oral     SpO2 10/13/15 1932 98 %     Weight 10/13/15 1932 134 lb (60.782 kg)     Height 10/13/15 1932  (1.626 m)     Head Cir --      Peak Flow --      Pain Score 10/13/15 1950 8   Constitutional: Alert and oriented. Well appearing and in no distress. Eyes: Conjunctivae are normal. PERRL. Normal extraocular movements. ENT   Head: Normocephalic and atraumatic.   Nose: No congestion/rhinnorhea.   Mouth/Throat: Mucous membranes are moist.   Neck: No stridor. Hematological/Lymphatic/Immunilogical: No cervical lymphadenopathy. Cardiovascular: Normal rate, regular rhythm.  No murmurs, rubs, or  gallops. Respiratory: Normal respiratory effort without tachypnea nor retractions. Breath sounds are clear and equal bilaterally. No wheezes/rales/rhonchi. Gastrointestinal: Soft and nontender. No distention.  Genitourinary: Deferred Musculoskeletal: Normal range of motion in all extremities. No joint effusions.  Ulcer wound to the left inner lower leg. It does appear to be healing well. No purulent drainage. No surrounding erythema. Mildly tender to palpation. Neurologic:  Normal speech and language. No gross focal neurologic deficits are appreciated.  Skin:  Skin is warm, dry and intact. No rash noted. Psychiatric: Mood and affect are normal. Speech and behavior are normal. Patient exhibits appropriate insight and judgment.  ____________________________________________    LABS (pertinent positives/negatives)  Labs Reviewed  CBC WITH DIFFERENTIAL/PLATELET - Abnormal; Notable for the following:    RBC 3.38 (*)    Hemoglobin 11.7 (*)    HCT 34.5 (*)    MCV 102.1 (*)    MCH 34.7 (*)    RDW 15.2 (*)    All other components within normal limits  BASIC METABOLIC PANEL     ____________________________________________   EKG  None  ____________________________________________    RADIOLOGY  None  ____________________________________________   PROCEDURES  Procedure(s) performed: None  Critical Care performed: No  ____________________________________________   INITIAL IMPRESSION / ASSESSMENT AND PLAN / ED COURSE  Pertinent labs & imaging results that were available during my care of the patient were reviewed by me and considered in my medical decision making (see chart for details).  Patient presented to the emergency department today because of concern for continued pain to a left leg ulcer. The ulcer itself looks good. No signs of any overt infection at this point. No surrounding cellulitis. Minimally tender to palpation. Additionally patient afebrile. Blood work  without any signs of infection. No leukocytosis doses. I will give patient wound care clinic follow-up.  ____________________________________________   FINAL CLINICAL IMPRESSION(S) / ED DIAGNOSES  Final diagnoses:  Skin ulcer, with unspecified severity (HCC)     Phineas Semen, MD 10/13/15 2328

## 2015-10-13 NOTE — ED Notes (Signed)
Patient with an open wound to left lower leg. Patient was discharged from the hospital on Monday for infection in her blood stream. Friend reports that the wound has increase in redness around the wound, low grade fever and confusion today.

## 2015-10-13 NOTE — ED Notes (Signed)
Patient presents to ED with same complaints as prior visit. Wound is beefy red in color. Has improved since this RN has assessed it. No necrotic tissue is seen. Drainage is clear pink.

## 2015-10-13 NOTE — Discharge Instructions (Signed)
Please seek medical attention for any high fevers, chest pain, shortness of breath, change in behavior, persistent vomiting, bloody stool or any other new or concerning symptoms. ° °Wound Care °Taking care of your wound properly can help to prevent pain and infection. It can also help your wound to heal more quickly.  °HOW TO CARE FOR YOUR WOUND  °· Take or apply over-the-counter and prescription medicines only as told by your health care provider. °· If you were prescribed antibiotic medicine, take or apply it as told by your health care provider. Do not stop using the antibiotic even if your condition improves. °· Clean the wound each day or as told by your health care provider. °¨ Wash the wound with mild soap and water. °¨ Rinse the wound with water to remove all soap. °¨ Pat the wound dry with a clean towel. Do not rub it. °· There are many different ways to close and cover a wound. For example, a wound can be covered with stitches (sutures), skin glue, or adhesive strips. Follow instructions from your health care provider about: °¨ How to take care of your wound. °¨ When and how you should change your bandage (dressing). °¨ When you should remove your dressing. °¨ Removing whatever was used to close your wound. °· Check your wound every day for signs of infection. Watch for: °¨ Redness, swelling, or pain. °¨ Fluid, blood, or pus. °· Keep the dressing dry until your health care provider says it can be removed. Do not take baths, swim, use a hot tub, or do anything that would put your wound underwater until your health care provider approves. °· Raise (elevate) the injured area above the level of your heart while you are sitting or lying down. °· Do not scratch or pick at the wound. °· Keep all follow-up visits as told by your health care provider. This is important. °SEEK MEDICAL CARE IF: °· You received a tetanus shot and you have swelling, severe pain, redness, or bleeding at the injection site. °· You have a  fever. °· Your pain is not controlled with medicine. °· You have increased redness, swelling, or pain at the site of your wound. °· You have fluid, blood, or pus coming from your wound. °· You notice a bad smell coming from your wound or your dressing. °SEEK IMMEDIATE MEDICAL CARE IF: °· You have a red streak going away from your wound. °  °This information is not intended to replace advice given to you by your health care provider. Make sure you discuss any questions you have with your health care provider. °  °Document Released: 03/27/2008 Document Revised: 11/02/2014 Document Reviewed: 06/14/2014 °Elsevier Interactive Patient Education ©2016 [employer] ° °

## 2016-08-16 ENCOUNTER — Encounter: Payer: Self-pay | Admitting: Emergency Medicine

## 2016-08-16 ENCOUNTER — Emergency Department
Admission: EM | Admit: 2016-08-16 | Discharge: 2016-08-16 | Disposition: A | Discharge status: Home / Self Care | Payer: Medicaid Other | Attending: Emergency Medicine | Admitting: Emergency Medicine

## 2016-08-16 DIAGNOSIS — I10 Essential (primary) hypertension: Secondary | ICD-10-CM | POA: Diagnosis not present

## 2016-08-16 DIAGNOSIS — Z5321 Procedure and treatment not carried out due to patient leaving prior to being seen by health care provider: Secondary | ICD-10-CM | POA: Insufficient documentation

## 2016-08-16 DIAGNOSIS — F17200 Nicotine dependence, unspecified, uncomplicated: Secondary | ICD-10-CM | POA: Insufficient documentation

## 2016-08-16 DIAGNOSIS — J45909 Unspecified asthma, uncomplicated: Secondary | ICD-10-CM | POA: Insufficient documentation

## 2016-08-16 NOTE — ED Triage Notes (Signed)
Pt states she is supposed to be taking blood pressure meds but is scared to take them. Pt states her left hip has been bothering her and the pain made her blood pressure really high on Monday. Pt denies any other symptoms at this time. After taking blood pressure in triage, pt decides that she will just follow up with her primary. NAD, skin warm and dry.

## 2016-10-20 ENCOUNTER — Emergency Department: Payer: Medicaid Other

## 2016-10-20 ENCOUNTER — Encounter: Payer: Self-pay | Admitting: Emergency Medicine

## 2016-10-20 ENCOUNTER — Emergency Department
Admission: EM | Admit: 2016-10-20 | Discharge: 2016-10-22 | Disposition: A | Discharge status: Home / Self Care | Payer: Medicaid Other | Attending: Emergency Medicine | Admitting: Emergency Medicine

## 2016-10-20 DIAGNOSIS — I10 Essential (primary) hypertension: Secondary | ICD-10-CM | POA: Insufficient documentation

## 2016-10-20 DIAGNOSIS — F1096 Alcohol use, unspecified with alcohol-induced persisting amnestic disorder: Secondary | ICD-10-CM

## 2016-10-20 DIAGNOSIS — F17200 Nicotine dependence, unspecified, uncomplicated: Secondary | ICD-10-CM | POA: Diagnosis not present

## 2016-10-20 DIAGNOSIS — F1910 Other psychoactive substance abuse, uncomplicated: Secondary | ICD-10-CM | POA: Diagnosis not present

## 2016-10-20 DIAGNOSIS — R4182 Altered mental status, unspecified: Secondary | ICD-10-CM | POA: Diagnosis present

## 2016-10-20 DIAGNOSIS — Z79899 Other long term (current) drug therapy: Secondary | ICD-10-CM | POA: Insufficient documentation

## 2016-10-20 DIAGNOSIS — F1090 Alcohol use, unspecified, uncomplicated: Secondary | ICD-10-CM

## 2016-10-20 DIAGNOSIS — J45909 Unspecified asthma, uncomplicated: Secondary | ICD-10-CM | POA: Diagnosis not present

## 2016-10-20 DIAGNOSIS — F1410 Cocaine abuse, uncomplicated: Secondary | ICD-10-CM

## 2016-10-20 DIAGNOSIS — F1010 Alcohol abuse, uncomplicated: Secondary | ICD-10-CM

## 2016-10-20 LAB — URINALYSIS, COMPLETE (UACMP) WITH MICROSCOPIC
Bacteria, UA: NONE SEEN
Bilirubin Urine: NEGATIVE
Glucose, UA: NEGATIVE mg/dL
Hgb urine dipstick: NEGATIVE
Ketones, ur: 20 mg/dL — AB
Leukocytes, UA: NEGATIVE
Nitrite: NEGATIVE
Protein, ur: 30 mg/dL — AB
RBC / HPF: NONE SEEN RBC/hpf (ref 0–5)
SPECIFIC GRAVITY, URINE: 1.024 (ref 1.005–1.030)
SQUAMOUS EPITHELIAL / LPF: NONE SEEN
pH: 5 (ref 5.0–8.0)

## 2016-10-20 LAB — COMPREHENSIVE METABOLIC PANEL
ALK PHOS: 100 U/L (ref 38–126)
ALT: 30 U/L (ref 14–54)
AST: 56 U/L — ABNORMAL HIGH (ref 15–41)
Albumin: 4.2 g/dL (ref 3.5–5.0)
Anion gap: 10 (ref 5–15)
BUN: 26 mg/dL — ABNORMAL HIGH (ref 6–20)
CALCIUM: 9.5 mg/dL (ref 8.9–10.3)
CO2: 25 mmol/L (ref 22–32)
CREATININE: 0.77 mg/dL (ref 0.44–1.00)
Chloride: 102 mmol/L (ref 101–111)
GFR calc non Af Amer: >60 mL/min (ref >60)
Glucose, Bld: 135 mg/dL — ABNORMAL HIGH (ref 65–99)
Potassium: 3.6 mmol/L (ref 3.5–5.1)
SODIUM: 137 mmol/L (ref 135–145)
Total Bilirubin: 0.9 mg/dL (ref 0.3–1.2)
Total Protein: 7.8 g/dL (ref 6.5–8.1)

## 2016-10-20 LAB — CBC
HCT: 44.2 % (ref 35.0–47.0)
Hemoglobin: 15.1 g/dL (ref 12.0–16.0)
MCH: 33 pg (ref 26.0–34.0)
MCHC: 34.2 g/dL (ref 32.0–36.0)
MCV: 96.6 fL (ref 80.0–100.0)
PLATELETS: 167 10*3/uL (ref 150–440)
RBC: 4.57 MIL/uL (ref 3.80–5.20)
RDW: 14.1 % (ref 11.5–14.5)
WBC: 6.5 10*3/uL (ref 3.6–11.0)

## 2016-10-20 LAB — URINE DRUG SCREEN, QUALITATIVE (ARMC ONLY)
Amphetamines, Ur Screen: NOT DETECTED
BARBITURATES, UR SCREEN: NOT DETECTED
BENZODIAZEPINE, UR SCRN: POSITIVE — AB
CANNABINOID 50 NG, UR ~~LOC~~: NOT DETECTED
Cocaine Metabolite,Ur ~~LOC~~: POSITIVE — AB
MDMA (Ecstasy)Ur Screen: NOT DETECTED
Methadone Scn, Ur: NOT DETECTED
Opiate, Ur Screen: NOT DETECTED
PHENCYCLIDINE (PCP) UR S: NOT DETECTED
Tricyclic, Ur Screen: POSITIVE — AB

## 2016-10-20 LAB — ACETAMINOPHEN LEVEL

## 2016-10-20 LAB — ETHANOL: Alcohol, Ethyl (B): <5 mg/dL (ref <5)

## 2016-10-20 MED ORDER — SODIUM CHLORIDE 0.9 % IV BOLUS (SEPSIS)
1000.0000 mL | Freq: Once | INTRAVENOUS | Status: AC
  Administered 2016-10-20: 1000 mL via INTRAVENOUS

## 2016-10-20 MED ORDER — MIDAZOLAM HCL 2 MG/2ML IJ SOLN
INTRAMUSCULAR | Status: AC
  Administered 2016-10-20: 2 mg via INTRAVENOUS
  Filled 2016-10-20: qty 2

## 2016-10-20 MED ORDER — MIDAZOLAM HCL 2 MG/2ML IJ SOLN
INTRAMUSCULAR | Status: AC
  Filled 2016-10-20: qty 2

## 2016-10-20 MED ORDER — MIDAZOLAM HCL 5 MG/5ML IJ SOLN
2.0000 mg | Freq: Once | INTRAMUSCULAR | Status: AC
Start: 2016-10-20 — End: 2016-10-20
  Administered 2016-10-20: 2 mg via INTRAVENOUS

## 2016-10-20 MED ORDER — BENZOCAINE 20 % MT SOLN: Freq: Once | OROMUCOSAL | Status: DC

## 2016-10-20 MED ORDER — MIDAZOLAM HCL 2 MG/2ML IJ SOLN
2.0000 mg | Freq: Once | INTRAMUSCULAR | Status: AC
  Administered 2016-10-20: 2 mg via INTRAVENOUS

## 2016-10-20 NOTE — ED Triage Notes (Signed)
Arrived via EMS from home with reports of overdose and unresponsive.  EMS arrival states pt was awake but not verbally responsive. EMS gave Haldol  IM and Versed  IM.  Pt combative, moaning and yelling with EMS.  Per EMS pupils originally fixed and sluggish. Occasional sternal rub needed for arousal.  Pt has hx of alcohol abuse and family reports drinking yesterday.

## 2016-10-20 NOTE — ED Notes (Signed)

## 2016-10-20 NOTE — ED Provider Notes (Signed)
[facility] Emergency Department Provider Note   ____________________________________________    I have reviewed the triage vital signs and the nursing notes.   HISTORY  Chief Complaint Altered Mental Status and Drug Overdose  History limited by altered mental status   HPI Nicole [name] is a 56 y.o. female who presents with altered mental status. Per EMS family reports patient has a history of alcohol abuse and was drinking last night. There is also a concern about possible Seroquel abuse or other drug abuse. On arrival patient was altered and when they went to move the patient Patient became combative with EMS and was given 5 of Haldol and 2 of Versed   Past Medical History:  Diagnosis Date  . Anxiety   . Asthma   . Collagen vascular disease [facility])     Patient Active Problem List   Diagnosis Date Noted  . Cellulitis and abscess 10/06/2015  . Sepsis (HCC) 10/06/2015  . Anxiety 10/06/2015  . Asthma 10/06/2015  . Cellulitis     Past Surgical History:  Procedure Laterality Date  . ABDOMINAL HYSTERECTOMY    . FOOT SURGERY      Prior to Admission medications   Medication Sig Start Date End Date Taking? Authorizing Provider  baclofen (LIORESAL) 10 MG tablet Take 1 tablet by mouth 3 (three) times daily as needed. 10/13/16  Yes Historical Provider, MD  gabapentin (NEURONTIN) 300 MG capsule Take 300 mg by mouth 4 (four) times daily.  with meals,  at bedtime   Yes Historical Provider, MD  QUEtiapine (SEROQUEL) 200 MG tablet Take 200 mg by mouth at bedtime.  3 times daily,  at bedtime   Yes Historical Provider, MD  SUBOXONE 8-2 MG FILM Place 1 Film under the tongue 2 (two) times daily. 10/08/16  Yes Historical Provider, MD  amoxicillin-clavulanate (AUGMENTIN) 875-125 MG tablet Take 1 tablet by mouth 2 (two) times daily. Patient not taking: Reported on 10/20/2016 10/09/15   Milagros Loll, MD  BELSOMRA 15 MG TABS Take 1 tablet by  mouth at bedtime as needed.  09/16/15   Historical Provider, MD  clonazePAM (KLONOPIN) 0.5 MG tablet Take 0.5 mg by mouth daily. 08/09/16   Historical Provider, MD  DULoxetine (CYMBALTA) 30 MG capsule Take 30 mg by mouth daily.    Historical Provider, MD  Ibuprofen-Diphenhydramine Cit (ADVIL PM PO) Take 1 tablet by mouth every evening.    Historical Provider, MD  oxyCODONE-acetaminophen (PERCOCET/ROXICET) 5-325 MG tablet Take 1 tablet by mouth every 6 (six) hours as needed for moderate pain or severe pain. Patient not taking: Reported on 10/20/2016 10/09/15   Milagros Loll, MD     Allergies Patient has no known allergies.  Family History  Problem Relation Age of Onset  . Rheum arthritis    . Osteoporosis    . Clotting disorder    . Stroke      Social History Social History  Substance Use Topics  . Smoking status: Current Every Day Smoker  . Smokeless tobacco: Never Used  . Alcohol use No    Level V caveat: Unable to obtain Review of Systems due to altered mental status    ____________________________________________   PHYSICAL EXAM:  VITAL SIGNS: ED Triage Vitals  Enc Vitals Group     BP 10/20/16 0954 (!) 180/106     Pulse Rate 10/20/16 0954 71     Resp 10/20/16 0954 (!) 24     Temp 10/20/16 0954 97.1 F (36.2 C)  Temp Source 10/20/16 0954 Oral     SpO2 10/20/16 0954 100 %     Weight 10/20/16 0950 120 lb (54.4 kg)     Height 10/20/16 0950  (1.626 m)     Head Circumference --      Peak Flow --      Pain Score --      Pain Loc --      Pain Edu? --      Excl. in GC? --     Constitutional: Eyes open but confused Eyes: Conjunctivae are normal.  Head: Atraumatic. Nose: No congestion/rhinnorhea. Mouth/Throat: Mucous membranes are moist.    Cardiovascular: Normal rate, regular rhythm. Grossly normal heart sounds.  Good peripheral circulation. Respiratory: Normal respiratory effort.  No retractions. Lungs CTAB. Gastrointestinal: Soft and nontender. No  distention.  No CVA tenderness. Genitourinary: No erythema or rash Musculoskeletal:   Warm and well perfused Neurologic:  Patient moves all extremities Skin:  Skin is warm, dry and intact. No rash noted. Psychiatric: Unable to examine  ____________________________________________   LABS (all labs ordered are listed, but only abnormal results are displayed)  Labs Reviewed  COMPREHENSIVE METABOLIC PANEL - Abnormal; Notable for the following:       Result Value   Glucose, Bld 135 (*)    BUN 26 (*)    AST 56 (*)    All other components within normal limits  URINE DRUG SCREEN, QUALITATIVE ([facility] ONLY) - Abnormal; Notable for the following:    Tricyclic, Ur Screen POSITIVE (*)    Cocaine Metabolite,Ur  POSITIVE (*)    Benzodiazepine, Ur Scrn POSITIVE (*)    All other components within normal limits  ACETAMINOPHEN LEVEL - Abnormal; Notable for the following:    Acetaminophen (Tylenol), Serum <10 (*)    All other components within normal limits  URINALYSIS, COMPLETE (UACMP) WITH MICROSCOPIC - Abnormal; Notable for the following:    Color, Urine YELLOW (*)    APPearance CLEAR (*)    Ketones, ur 20 (*)    Protein, ur 30 (*)    All other components within normal limits  ETHANOL  CBC   ____________________________________________  EKG  ED ECG REPORT I, Jene Every, the attending physician, personally viewed and interpreted this ECG.  Date: 10/20/2016 EKG Time: 9:47 AM Rate: 69 Rhythm: normal sinus rhythm QRS Axis: normal Intervals: normal ST/T Wave abnormalities: normal Conduction Disturbances: none Narrative Interpretation: unremarkable  ____________________________________________  RADIOLOGY  Chest x-ray and CT head unremarkable ____________________________________________   PROCEDURES  Procedure(s) performed: No    Critical Care performed: No ____________________________________________   INITIAL IMPRESSION / ASSESSMENT AND PLAN / ED  COURSE  Pertinent labs & imaging results that were available during my care of the patient were reviewed by me and considered in my medical decision making (see chart for details).  Patient presents with altered mental status and possible overdose. Also possible recreational drug use. Vital signs are stable. We will monitor her closely in the emergency department     ----------------------------------------- 10:58 AM on 10/20/2016 -----------------------------------------   OBSERVATION CARE: This patient is being placed under observation care for the following reasons: Questionable overdose observed to r/o significant toxicity   ----------------------------------------- 11:26 AM on 10/20/2016 -----------------------------------------  Patient snoring, vitals stable. Positive cocaine on urine drug screen but EtOH negative.  ----------------------------------------- 12:13 PM on 10/20/2016 -----------------------------------------  Patient remained stable, vitals normal  ----------------------------------------- 1:16 PM on 10/20/2016 -----------------------------------------  Patient remains stable  ----------------------------------------- 2:13 PM on 10/20/2016 -----------------------------------------  Patient more  alert now and answering simple questions.  ----------------------------------------- 2:29 PM on 10/20/2016 -----------------------------------------  We'll continue to monitor in the emergency department, she seems to be improving and I suspect she will be able to be discharged. I have asked my colleague to re-evaluate her      ____________________________________________   FINAL CLINICAL IMPRESSION(S) / ED DIAGNOSES  Final diagnoses:  Polysubstance abuse  Altered mental status, unspecified altered mental status type      NEW MEDICATIONS STARTED DURING THIS VISIT:  New Prescriptions   No medications on file     Note:  This document was  prepared using Dragon voice recognition software and may include unintentional dictation errors.    Jene Every, MD 10/20/16 1430

## 2016-10-20 NOTE — B[facility] Counselor (Signed)
Unable to complete TTS consult due to patient's altered mental state.

## 2016-10-20 NOTE — ED Notes (Signed)
Pt continues to shift around in the bed, pulling off blankets. Continues to say "ok, ok, ok" Moved closer to nurses station. Respirations equal and unlabored. Awaiting disposition plans.

## 2016-10-20 NOTE — ED Notes (Signed)
PT  IVC 

## 2016-10-20 NOTE — ED Notes (Signed)
Spoke with husband Christen Bame [phone]) given update that patient is resting at this time and disposition is unknown. He is not able to get up here to visit yet, but was able to provide pharmacy information-[facility] [place].

## 2016-10-20 NOTE — ED Notes (Signed)
Offered bathroom but pt is not alert enough to comprehend what I am saying. Fluids started per Dr. Mayford Knife order. Continue to monitor. Plan to move to behavioral Quad once room is available.  Pt is under IVC. Officer present.

## 2016-10-20 NOTE — ED Notes (Signed)
Resting with eyes closed, respirations equal and unlabored.

## 2016-10-20 NOTE — ED Notes (Signed)
Hooked pt up to monitor.

## 2016-10-20 NOTE — ED Notes (Signed)
Pt transported to CT. Malen Gauze RN with patient.

## 2016-10-20 NOTE — ED Notes (Signed)
Pt dressed into scrubs.  Personal belongings with Jenel Lucks, RN to lock up.

## 2016-10-20 NOTE — ED Notes (Addendum)
Pt awake and responding to name and moving all extremities.  Occasional moan. Opens eyes spontaneously. Dr. Cyril Loosen in room to evaluate patient.

## 2016-10-20 NOTE — ED Notes (Signed)
Floor pad placed around bed, bed alarm attached to patient.

## 2016-10-20 NOTE — ED Notes (Signed)
Patient thrashing around in bed keeps yelling out "ok, ok, ok" Dr. Cyril Loosen evaluated patient and ordered  of Versed IV.

## 2016-10-21 MED ORDER — CHLORDIAZEPOXIDE HCL 10 MG PO CAPS
10.0000 mg | ORAL_CAPSULE | Freq: Three times a day (TID) | ORAL | Status: DC
  Administered 2016-10-21 – 2016-10-22 (×3): 10 mg via ORAL
  Filled 2016-10-21 (×3): qty 1

## 2016-10-21 MED ORDER — HYDROCHLOROTHIAZIDE 25 MG PO TABS: 12.5000 mg | ORAL_TABLET | Freq: Two times a day (BID) | ORAL | Status: DC

## 2016-10-21 MED ORDER — LORAZEPAM 2 MG PO TABS
2.0000 mg | ORAL_TABLET | Freq: Once | ORAL | Status: AC
  Administered 2016-10-21: 2 mg via ORAL

## 2016-10-21 MED ORDER — HYDROCHLOROTHIAZIDE 25 MG PO TABS
12.5000 mg | ORAL_TABLET | Freq: Two times a day (BID) | ORAL | Status: DC
  Administered 2016-10-21 – 2016-10-22 (×3): 12.5 mg via ORAL
  Filled 2016-10-21 (×3): qty 1

## 2016-10-21 MED ORDER — LORAZEPAM 2 MG/ML IJ SOLN: 2.0000 mg | Freq: Once | INTRAMUSCULAR | Status: DC

## 2016-10-21 MED ORDER — HYDROCHLOROTHIAZIDE 25 MG PO TABS: 12.5000 mg | ORAL_TABLET | Freq: Once | ORAL | Status: DC

## 2016-10-21 MED ORDER — LORAZEPAM 2 MG PO TABS
ORAL_TABLET | ORAL | Status: AC
  Filled 2016-10-21: qty 1

## 2016-10-21 MED ORDER — HALOPERIDOL 0.5 MG PO TABS
2.0000 mg | ORAL_TABLET | Freq: Two times a day (BID) | ORAL | Status: DC
  Administered 2016-10-21 – 2016-10-22 (×3): 2 mg via ORAL
  Filled 2016-10-21: qty 1
  Filled 2016-10-21 (×2): qty 4
  Filled 2016-10-21: qty 1

## 2016-10-21 MED ORDER — HYDROCHLOROTHIAZIDE 25 MG PO TABS
12.5000 mg | ORAL_TABLET | Freq: Once | ORAL | Status: DC
  Filled 2016-10-21: qty 1

## 2016-10-21 MED ORDER — LORAZEPAM 2 MG PO TABS
ORAL_TABLET | ORAL | Status: AC
  Administered 2016-10-21: 2 mg
  Filled 2016-10-21: qty 1

## 2016-10-21 NOTE — ED Notes (Signed)

## 2016-10-21 NOTE — ED Provider Notes (Signed)
-----------------------------------------   4:30 AM on 10/21/2016 -----------------------------------------   Blood pressure (!) 195/104, pulse 80, temperature 98.9 F (37.2 C), temperature source Oral, resp. rate 18, height  (1.626 m), weight 120 lb (54.4 kg), SpO2 100 %.  The patient had no acute events since last update.  Calm and cooperative at this time.  Disposition is pending Psychiatry/Behavioral Medicine team recommendations.  Blood pressure is very high will repeat and if it remains up we'll consider antihypertensive therapy   Arnaldo Natal, MD 10/21/16 0430

## 2016-10-21 NOTE — ED Notes (Signed)
Report given verbally via phone to [facility] MD. MD will be calling in to complete assessment.

## 2016-10-21 NOTE — ED Notes (Signed)
SOC set up in room for assessment at this time.

## 2016-10-21 NOTE — ED Notes (Signed)
Patient transferred in wine colored scrubs from ED to [facility] bed 5. Patient wanded and oriented to unit. Pt anxious and teary but cooperative during interview. Pt reports having combined pills and alcohol after talking with 54 year old son on the phone. Pt denies SI/ HI and A/V hallucinations at this time. Patient remains safe with 15 minute checks.

## 2016-10-21 NOTE — ED Notes (Signed)
Pt brought dinner

## 2016-10-21 NOTE — ED Notes (Signed)
Pt. Alert and oriented, warm and dry, in no distress. Pt. Denies SI, HI, and AVH. Pt keeps coming to nurses station and asking for staff to put TV on [employer] channel. This Clinical research associate has explained to patient multiple times TV is on [employer] that a commercial is currently on and once commercial is over with movie will play again. Pt. Encouraged to let nursing staff know of any concerns or needs.

## 2016-10-21 NOTE — ED Notes (Signed)

## 2016-10-21 NOTE — ED Notes (Signed)
Husband here to visit patient 

## 2016-10-21 NOTE — BH Assessment (Signed)
Assessment Note  Nicole [name] is an 56 y.o. female presenting to the ED with altered mental status. Per EMS , family reports patient has a history of alcohol abuse and was drinking last night. Patient's family also has a concern about possible Seroquel abuse.  Assessment limited due to patient's altered mental status.  Pt is oriented to date and place.  She states she does not know why she is at the ED.  She denies drinking and taking drugs.  She denies SI.  She denies needing any help.  Diagnosis: Drug Use disorder  Past Medical History:  Past Medical History:  Diagnosis Date  . Anxiety   . Asthma   . Collagen vascular disease [facility])     Past Surgical History:  Procedure Laterality Date  . ABDOMINAL HYSTERECTOMY    . FOOT SURGERY      Family History:  Family History  Problem Relation Age of Onset  . Rheum arthritis    . Osteoporosis    . Clotting disorder    . Stroke      Social History:  reports that she has been smoking.  She has never used smokeless tobacco. She reports that she does not drink alcohol or use drugs.  Additional Social History:  Alcohol / Drug Use Pain Medications: See PTA Prescriptions: See PTA Over the Counter: See PTA History of alcohol / drug use?: Yes  CIWA: CIWA-Ar BP: (!) 160/97 Pulse Rate: 78 COWS:    Allergies: No Known Allergies  Home Medications:  (Not in a hospital admission)  OB/GYN Status:  No LMP recorded. Patient has had a hysterectomy.  General Assessment Data Location of Assessment: [facility] ED TTS Assessment: In system Is this a Tele or Face-to-Face Assessment?: Face-to-Face Is this an Initial Assessment or a Re-assessment for this encounter?: Initial Assessment Marital status: Married Is patient pregnant?: No Pregnancy Status: No Living Arrangements: Spouse/significant other Can pt return to current living arrangement?: Yes Admission Status: Involuntary Is patient capable of signing voluntary admission?:  No Referral Source: Self/Family/Friend Insurance type: Medicaid     Crisis Care Plan Living Arrangements: Spouse/significant other Legal Guardian: Other: (self) Name of Psychiatrist: unknown Name of Therapist: unknown  Education Status Is patient currently in school?: No Current Grade: na Highest grade of school patient has completed: unknown Name of school: na Contact person: na  Risk to self with the past 6 months Suicidal Ideation: No Has patient been a risk to self within the past 6 months prior to admission? : No Suicidal Intent: No Has patient had any suicidal intent within the past 6 months prior to admission? : No Is patient at risk for suicide?: No Suicidal Plan?: No Has patient had any suicidal plan within the past 6 months prior to admission? : No Access to Means: No What has been your use of drugs/alcohol within the last 12 months?: alcohol on a daily basis Previous Attempts/Gestures: No Other Self Harm Risks: suspected drug/alcohol addiction Triggers for Past Attempts: Hallucinations Intentional Self Injurious Behavior: None Family Suicide History: Unknown Recent stressful life event(s): Other (Comment) Persecutory voices/beliefs?: No Depression: No Substance abuse history and/or treatment for substance abuse?: Yes Suicide prevention information given to non-admitted patients: Not applicable  Risk to Others within the past 6 months Homicidal Ideation: No Does patient have any lifetime risk of violence toward others beyond the six months prior to admission? : Unknown Thoughts of Harm to Others: No Current Homicidal Intent: No Current Homicidal Plan: No Access to Homicidal Means: No  History of harm to others?: No Assessment of Violence: None Noted Does patient have access to weapons?: No Criminal Charges Pending?: No Does patient have a court date: No Is patient on probation?: Unknown  Psychosis Hallucinations: None noted Delusions: None  noted  Mental Status Report Appearance/Hygiene: Bizarre, In scrubs Eye Contact: Good Motor Activity: Unsteady Speech: Slow Level of Consciousness: Sedated Mood: Helpless Affect: Inconsistent with thought content Anxiety Level: Minimal Thought Processes: Circumstantial Judgement: Impaired Orientation: Person, Place Obsessive Compulsive Thoughts/Behaviors: Unable to Assess  Cognitive Functioning Concentration: Decreased Memory: Recent Impaired IQ: Average Insight: Unable to Assess Impulse Control: Unable to Assess Appetite: Fair Sleep: Unable to Assess Vegetative Symptoms: Unable to Assess  ADLScreening [facility] Assessment Services) Patient's cognitive ability adequate to safely complete daily activities?: Yes Patient able to express need for assistance with ADLs?: Yes Independently performs ADLs?: No  Prior Inpatient Therapy Prior Inpatient Therapy: No Prior Therapy Dates: na Prior Therapy Facilty/Provider(s): na Reason for Treatment: na  Prior Outpatient Therapy Prior Outpatient Therapy: No Prior Therapy Dates: na Prior Therapy Facilty/Provider(s): na Reason for Treatment: na Does patient have an ACCT team?: Unknown Does patient have Intensive In-House Services?  : No Does patient have [facility] services? : Unknown Does patient have P4CC services?: Unknown  ADL Screening (condition at time of admission) Patient's cognitive ability adequate to safely complete daily activities?: Yes Patient able to express need for assistance with ADLs?: Yes Independently performs ADLs?: No       Abuse/Neglect Assessment (Assessment to be complete while patient is alone) Physical Abuse: Denies Verbal Abuse: Denies Sexual Abuse: Denies Exploitation of patient/patient's resources: Denies Self-Neglect: Denies Values / Beliefs Cultural Requests During Hospitalization: None Spiritual Requests During Hospitalization: None Consults Spiritual Care Consult Needed: No Social Work  Consult Needed: No      Additional Information 1:1 In Past 12 Months?: No CIRT Risk: No Elopement Risk: No Does patient have medical clearance?: No     Disposition:  Disposition Initial Assessment Completed for this Encounter: Yes Disposition of Patient: Other dispositions Other disposition(s): Other (Comment)  On Site Evaluation by:   Reviewed with Physician:    Artist Beach 10/21/2016 6:50 AM

## 2016-10-21 NOTE — ED Notes (Signed)
Snack given.

## 2016-10-21 NOTE — BH Assessment (Signed)
Referral information for Psychiatric Hospitalization faxed to;    [facility] [phone])   [facility] [phone]),    [facility] [phone]),    [address] [phone]),    [facility] [phone])   [facility] [phone]),    [facility] [phone]),    [facility] [phone]),   [facility],   [facility].

## 2016-10-21 NOTE — ED Notes (Signed)
ED BHU PLACEMENT JUSTIFICATION Is the patient under IVC or is there intent for IVC: Yes.   Is the patient medically cleared: Yes.   Is there vacancy in the ED BHU: Yes.   Is the population mix appropriate for patient: Yes.   Is the patient awaiting placement in inpatient or outpatient setting: Yes.   Has the patient had a psychiatric consult: Yes.   Survey of unit performed for contraband, proper placement and condition of furniture, tampering with fixtures in bathroom, shower, and each patient room: Yes.  ; Findings: NA APPEARANCE/BEHAVIOR calm, cooperative and adequate rapport can be established NEURO ASSESSMENT Orientation: time, place and person Hallucinations: No.None noted (Hallucinations) Speech: Rate:Slow Gait: normal RESPIRATORY ASSESSMENT Normal expansion.  Clear to auscultation.  No rales, rhonchi, or wheezing. CARDIOVASCULAR ASSESSMENT regular rate and rhythm, S1, S2 normal, no murmur, click, rub or gallop GASTROINTESTINAL ASSESSMENT soft, nontender, BS WNL, no r/g EXTREMITIES normal strength, tone, and muscle mass PLAN OF CARE Provide calm/safe environment. Vital signs assessed twice daily. ED BHU Assessment once each 12-hour shift. Collaborate with intake RN daily or as condition indicates. Assure the ED provider has rounded once each shift. Provide and encourage hygiene. Provide redirection as needed. Assess for escalating behavior; address immediately and inform ED provider.  Assess family dynamic and appropriateness for visitation as needed: Yes.  ; If necessary, describe findings: NA Educate the patient/family about BHU procedures/visitation: Yes.  ; If necessary, describe findings: NA

## 2016-10-21 NOTE — ED Provider Notes (Signed)
The patient was transferred from the [facility] to the medical side for elevated blood pressure to 160/120.  She was felt to be more altered.  She is an alcoholic, but is currently not withdrawing from alcohol.  To me she is clearly somewhat confused, but this could be normal for an alcoholic who is cocaine positive.  I will initiate HCTZ for asymptomatic HTN and implement the BID Haldol and TID librium SOC recommended.   Merrily Brittle, MD 10/21/16 1407

## 2016-10-21 NOTE — ED Notes (Signed)
ED Dr notified of pt's elevated blood pressure and confusion. Pt to be moved to room 16 in ED

## 2016-10-21 NOTE — ED Notes (Signed)
SOC recommends in pt treatment.  

## 2016-10-21 NOTE — ED Notes (Signed)
Patient incontinent in bathroom. Pt offered new scrubs, underwear, socks, towels and toiletries and is taking a shower.

## 2016-10-22 DIAGNOSIS — F1410 Cocaine abuse, uncomplicated: Secondary | ICD-10-CM

## 2016-10-22 DIAGNOSIS — F1096 Alcohol use, unspecified with alcohol-induced persisting amnestic disorder: Secondary | ICD-10-CM | POA: Diagnosis not present

## 2016-10-22 DIAGNOSIS — F1090 Alcohol use, unspecified, uncomplicated: Secondary | ICD-10-CM

## 2016-10-22 DIAGNOSIS — F1010 Alcohol abuse, uncomplicated: Secondary | ICD-10-CM

## 2016-10-22 HISTORY — DX: Alcohol use, unspecified with alcohol-induced persisting amnestic disorder: F10.96

## 2016-10-22 NOTE — ED Notes (Signed)
Pt teary, stating, " I miss my family". Pt asking to use phone. After a few attempts dialing wrong number, pt was able to recall the correct phone number. Nicole [name]

## 2016-10-22 NOTE — ED Provider Notes (Signed)
Cleared for discharge by Dr. Luz Lex, MD 10/22/16 1236

## 2016-10-22 NOTE — ED Provider Notes (Signed)
-----------------------------------------   6:42 AM on 10/22/2016 -----------------------------------------   Blood pressure (!) 136/97, pulse 96, temperature 98.2 F (36.8 C), temperature source Oral, resp. rate 18, height  (1.626 m), weight 120 lb (54.4 kg), SpO2 100 %.  The patient had no acute events since last update.  Calm and cooperative at this time.  Disposition is pending Psychiatry/Behavioral Medicine team recommendations.     Irean Hong, MD 10/22/16 [phone]

## 2016-10-22 NOTE — Consult Note (Signed)
[facility] Face-to-Face Psychiatry Consult   Reason for Consult:  Consult for 56 year old woman brought to the emergency room early in the weekend with altered mental status possible overdose Referring Physician:  Cyril Loosen Patient Identification: Nicole [name] MRN:  [id number] Principal Diagnosis: Korsakoff's psychosis, alcohol related (HCC) Diagnosis:   Patient Active Problem List   Diagnosis Date Noted  . Alcohol abuse [F10.10] 10/22/2016  . 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 Cocaine abuse [F14.10] 10/22/2016  . Cellulitis and abscess [L03.90, L02.91] 10/06/2015  . Sepsis (HCC) [A41.9] 10/06/2015  . Anxiety [F41.9] 10/06/2015  . Asthma [J45.909] 10/06/2015  . Cellulitis [L03.90]     Total Time spent with patient: 1 hour  Subjective:   Nicole [name] is a 56 y.o. female patient admitted with "my husband is tired of my drinking".  HPI:  Patient interviewed chart reviewed. 56 year old woman with reported history of substance abuse. She came in over the weekend by EMS with altered mental status. Apparently there was some concern about overdose although what substance was unclear. Also she was agitated and had to be sedated by EMS workers. Patient has displayed some memory impairment and confusion especially early on. Her alcohol level was negative but her drug screen was positive for several things including cocaine. Patient tells me that what she remembers is that she's been drinking steadily. She says she drinks about 2 pints of liquor a week. She is doing that and taking her prescription Suboxone. She denies that she is using any other drugs. She says her mood is fairly okay. Denies suicidal or homicidal thoughts. Denies having any psychosis or hallucinations. Has some insight into having chronic memory problems. Patient denies any history of seizures or delirium tremens in the past. She denies being on any other psychiatric medicine besides the Suboxone  which I did confirm she is prescribed.  Social history: Patient lives with her husband. She has a 78 and 42 year old sons who live at home. Has a 49 year old son who is at a wilderness camp. Patient does not work outside the home. She says that her mother died at age 67 which frightens her. She reports that she also had a previous relationship with a man who committed suicide about 15 years ago which still upsets her.  Medical history: Not really aware of having any ongoing medical problems. Has a history of asthma doesn't seem concerned about it. As I said above no history of seizures.  Substance abuse history: Patient says she's had up to 10 months of sobriety in the past but did that by being at an inpatient facility. Has been able to stay sober for only short periods of time as an outpatient.  Past Psychiatric History: Patient says that she had a suicide attempt as a teenager but she says it was really just a little bit of cutting and wasn't very serious. No suicide attempts since then. No psychiatric hospitalization other than inpatient substance abuse. Doesn't appear to be on any psychiatric medicine other than the Subutex. No history of violence.  Risk to Self: Suicidal Ideation: No Suicidal Intent: No Is patient at risk for suicide?: No Suicidal Plan?: No Access to Means: No What has been your use of drugs/alcohol within the last 12 months?: alcohol on a daily basis Other Self Harm Risks: suspected drug/alcohol addiction Triggers for Past Attempts: Hallucinations Intentional Self Injurious Behavior: None Risk to Others: Homicidal Ideation: No Thoughts of Harm to Others: No Current Homicidal Intent: No Current Homicidal Plan: No Access  to Homicidal Means: No History of harm to others?: No Assessment of Violence: None Noted Does patient have access to weapons?: No Criminal Charges Pending?: No Does patient have a court date: No Prior Inpatient Therapy: Prior Inpatient Therapy:  No Prior Therapy Dates: na Prior Therapy Facilty/Provider(s): na Reason for Treatment: na Prior Outpatient Therapy: Prior Outpatient Therapy: No Prior Therapy Dates: na Prior Therapy Facilty/Provider(s): na Reason for Treatment: na Does patient have an ACCT team?: Unknown Does patient have Intensive In-House Services?  : No Does patient have [facility] services? : Unknown Does patient have P4CC services?: Unknown  Past Medical History:  Past Medical History:  Diagnosis Date  . Anxiety   . Asthma   . Collagen vascular disease [facility])     Past Surgical History:  Procedure Laterality Date  . ABDOMINAL HYSTERECTOMY    . FOOT SURGERY     Family History:  Family History  Problem Relation Age of Onset  . Rheum arthritis    . Osteoporosis    . Clotting disorder    . Stroke     Family Psychiatric  History: She says her mother had bipolar disorder and alcohol abuse Social History:  History  Alcohol Use No     History  Drug Use No    Social History   Social History  . Marital status: Married    Spouse name: N/A  . Number of children: N/A  . Years of education: N/A   Social History Main Topics  . Smoking status: Current Every Day Smoker  . Smokeless tobacco: Never Used  . Alcohol use No  . Drug use: No  . Sexual activity: Not Asked   Other Topics Concern  . None   Social History Narrative  . None   Additional Social History:    Allergies:  No Known Allergies  Labs: No results found for this or any previous visit (from the past 48 hour(s)).  Current Facility-Administered Medications  Medication Dose Route Frequency Provider Last Rate Last Dose  . chlordiazePOXIDE (LIBRIUM) capsule 10 mg  10 mg Oral TID Merrily Brittle, MD   10 mg at 10/22/16 1610  . haloperidol (HALDOL) tablet 2 mg  2 mg Oral BID Merrily Brittle, MD   2 mg at 10/22/16 9604  . hydrochlorothiazide (HYDRODIURIL) tablet 12.5 mg  12.5 mg Oral BID Merrily Brittle, MD   12.5 mg at 10/22/16 5409    Current Outpatient Prescriptions  Medication Sig Dispense Refill  . baclofen (LIORESAL) 10 MG tablet Take 1 tablet by mouth 3 (three) times daily as needed.  0  . gabapentin (NEURONTIN) 300 MG capsule Take 300 mg by mouth 4 (four) times daily.  with meals,  at bedtime    . QUEtiapine (SEROQUEL) 200 MG tablet Take 200 mg by mouth at bedtime.  3 times daily,  at bedtime    . SUBOXONE 8-2 MG FILM Place 1 Film under the tongue 2 (two) times daily.  0  . amoxicillin-clavulanate (AUGMENTIN) 875-125 MG tablet Take 1 tablet by mouth 2 (two) times daily. (Patient not taking: Reported on 10/20/2016) 14 tablet 0  . BELSOMRA 15 MG TABS Take 1 tablet by mouth at bedtime as needed.   0  . clonazePAM (KLONOPIN) 0.5 MG tablet Take 0.5 mg by mouth daily.  0  . DULoxetine (CYMBALTA) 30 MG capsule Take 30 mg by mouth daily.    . Ibuprofen-Diphenhydramine Cit (ADVIL PM PO) Take 1 tablet by mouth every evening.    Marland Kitchen oxyCODONE-acetaminophen (PERCOCET/ROXICET) 5-325  MG tablet Take 1 tablet by mouth every 6 (six) hours as needed for moderate pain or severe pain. (Patient not taking: Reported on 10/20/2016) 20 tablet 0    Musculoskeletal: Strength & Muscle Tone: within normal limits Gait & Station: normal Patient leans: N/A  Psychiatric Specialty Exam: Physical Exam  Nursing note and vitals reviewed. Constitutional: She appears well-developed and well-nourished.  HENT:  Head: Normocephalic and atraumatic.  Eyes: Conjunctivae are normal. Pupils are equal, round, and reactive to light.  Neck: Normal range of motion.  Cardiovascular: Regular rhythm and normal heart sounds.   Respiratory: Effort normal and breath sounds normal. No respiratory distress.  GI: Soft.  Musculoskeletal: Normal range of motion.  Neurological: She is alert.  Skin: Skin is warm and dry.  Psychiatric: Judgment normal. Her affect is blunt. Her speech is delayed. She is slowed. Thought content is not paranoid.  Cognition and memory are impaired. She expresses no homicidal and no suicidal ideation. She exhibits abnormal recent memory and abnormal remote memory.    Review of Systems  Constitutional: Negative.   HENT: Negative.   Eyes: Negative.   Respiratory: Negative.   Cardiovascular: Negative.   Gastrointestinal: Negative.   Musculoskeletal: Negative.   Skin: Negative.   Neurological: Negative.   Psychiatric/Behavioral: Positive for memory loss and substance abuse. Negative for depression, hallucinations and suicidal ideas. The patient is nervous/anxious and has insomnia.     Blood pressure (!) 136/97, pulse 96, temperature 98.2 F (36.8 C), temperature source Oral, resp. rate 18, height  (1.626 m), weight 54.4 kg (120 lb), SpO2 100 %.Body mass index is 20.6 kg/m.  General Appearance: Fairly Groomed  Eye Contact:  Fair  Speech:  Slow  Volume:  Decreased  Mood:  Euthymic  Affect:  Constricted  Thought Process:  Disorganized  Orientation:  Other:  Oriented to place. He was disoriented to time. Took her a while to figure out the correct year.  Thought Content:  Rumination  Suicidal Thoughts:  No  Homicidal Thoughts:  No  Memory:  Immediate;   Fair Recent;   Poor Remote;   Fair  Judgement:  Fair  Insight:  Fair  Psychomotor Activity:  Decreased  Concentration:  Concentration: Poor  Recall:  Poor  Fund of Knowledge:  Fair  Language:  Fair  Akathisia:  No  Handed:  Right  AIMS (if indicated):     Assets:  Desire for Improvement Housing Physical Health Social Support  ADL's:  Intact  Cognition:  Impaired,  Mild and Moderate  Sleep:        Treatment Plan Summary: Plan 56 year old woman presented to the hospital with altered mental status. Has been here over the weekend without any seizures and without any behavior problems. She is now lucid although she appears to have short-term memory problems that I suspect are likely to be a persisting aspect of her alcohol abuse. No  evidence of suicidal or homicidal ideation or behavior. Not likely to benefit from inpatient treatment. Patient was given education about substance abuse treatment and strongly referred to go for outpatient counseling and therapy. Psychoeducation completed. Case reviewed with emergency room doctor. Patient has been taken off of involuntary commitment and can be discharged home.  Disposition: Patient does not meet criteria for psychiatric inpatient admission. Supportive therapy provided about ongoing stressors.  Mordecai Rasmussen, MD 10/22/2016 12:09 PM

## 2016-10-22 NOTE — ED Notes (Signed)
Observing patient in camera Patient appeared to be bobbing head. This Clinical research associate went to check on patient. Patient appeared to be sleeping and trying to eat banana. This Clinical research associate oriented patient. Patient states she is hungry. This Clinical research associate got patient a sandwich tray. Patient went to restroom and urinated on floor. This Clinical research associate along with ED tech clean patient and floor.

## 2016-10-22 NOTE — ED Notes (Signed)
Patient discharged to husband, Christen Bame, in lobby. Pt was stable and appreciative at that time. All papers and valuables returned. Verbal understanding expressed. Denies SI/HI and A/VH. Pt given opportunity to express concerns and ask questions.

## 2016-10-22 NOTE — ED Notes (Signed)
Lunch brought to patient 

## 2016-10-22 NOTE — ED Notes (Addendum)
Pt awake and alert and reports not sleeping well. Pt repeatedly asks the same questions (phone hours, visiting hours,).Brought breakfast to patient. Pt voices no complaints and denies SI/HI and A/V hallucinations. Pt remains safe with 15 minute checks.

## 2016-12-06 ENCOUNTER — Emergency Department
Admission: EM | Admit: 2016-12-06 | Discharge: 2016-12-06 | Disposition: A | Discharge status: Home / Self Care | Payer: Medicaid Other | Attending: Emergency Medicine | Admitting: Emergency Medicine

## 2016-12-06 DIAGNOSIS — J45909 Unspecified asthma, uncomplicated: Secondary | ICD-10-CM | POA: Insufficient documentation

## 2016-12-06 DIAGNOSIS — F10930 Alcohol use, unspecified with withdrawal, uncomplicated: Secondary | ICD-10-CM

## 2016-12-06 DIAGNOSIS — F1410 Cocaine abuse, uncomplicated: Secondary | ICD-10-CM | POA: Insufficient documentation

## 2016-12-06 DIAGNOSIS — F10230 Alcohol dependence with withdrawal, uncomplicated: Secondary | ICD-10-CM

## 2016-12-06 DIAGNOSIS — F10929 Alcohol use, unspecified with intoxication, unspecified: Secondary | ICD-10-CM

## 2016-12-06 DIAGNOSIS — F17200 Nicotine dependence, unspecified, uncomplicated: Secondary | ICD-10-CM | POA: Insufficient documentation

## 2016-12-06 DIAGNOSIS — F10220 Alcohol dependence with intoxication, uncomplicated: Secondary | ICD-10-CM | POA: Insufficient documentation

## 2016-12-06 DIAGNOSIS — F1020 Alcohol dependence, uncomplicated: Secondary | ICD-10-CM

## 2016-12-06 DIAGNOSIS — Z791 Long term (current) use of non-steroidal anti-inflammatories (NSAID): Secondary | ICD-10-CM | POA: Insufficient documentation

## 2016-12-06 LAB — URINE DRUG SCREEN, QUALITATIVE (ARMC ONLY)
AMPHETAMINES, UR SCREEN: NOT DETECTED
BARBITURATES, UR SCREEN: NOT DETECTED
Benzodiazepine, Ur Scrn: NOT DETECTED
COCAINE METABOLITE, UR ~~LOC~~: NOT DETECTED
Cannabinoid 50 Ng, Ur ~~LOC~~: NOT DETECTED
MDMA (Ecstasy)Ur Screen: NOT DETECTED
METHADONE SCREEN, URINE: NOT DETECTED
OPIATE, UR SCREEN: NOT DETECTED
PHENCYCLIDINE (PCP) UR S: NOT DETECTED
Tricyclic, Ur Screen: NOT DETECTED

## 2016-12-06 LAB — COMPREHENSIVE METABOLIC PANEL
ALBUMIN: 4.1 g/dL (ref 3.5–5.0)
ALK PHOS: 89 U/L (ref 38–126)
ALT: 59 U/L — AB (ref 14–54)
AST: 85 U/L — AB (ref 15–41)
Anion gap: 12 (ref 5–15)
BUN: 12 mg/dL (ref 6–20)
CALCIUM: 9.1 mg/dL (ref 8.9–10.3)
CHLORIDE: 104 mmol/L (ref 101–111)
CO2: 22 mmol/L (ref 22–32)
CREATININE: 0.6 mg/dL (ref 0.44–1.00)
GFR calc non Af Amer: >60 mL/min (ref >60)
GLUCOSE: 110 mg/dL — AB (ref 65–99)
Potassium: 3.3 mmol/L — ABNORMAL LOW (ref 3.5–5.1)
SODIUM: 138 mmol/L (ref 135–145)
Total Bilirubin: 0.6 mg/dL (ref 0.3–1.2)
Total Protein: 8 g/dL (ref 6.5–8.1)

## 2016-12-06 LAB — CBC
HEMATOCRIT: 49.2 % — AB (ref 35.0–47.0)
Hemoglobin: 17 g/dL — ABNORMAL HIGH (ref 12.0–16.0)
MCH: 33.9 pg (ref 26.0–34.0)
MCHC: 34.6 g/dL (ref 32.0–36.0)
MCV: 98.1 fL (ref 80.0–100.0)
Platelets: 259 10*3/uL (ref 150–440)
RBC: 5.01 MIL/uL (ref 3.80–5.20)
RDW: 13.3 % (ref 11.5–14.5)
WBC: 10.2 10*3/uL (ref 3.6–11.0)

## 2016-12-06 LAB — URINALYSIS, ROUTINE W REFLEX MICROSCOPIC
BILIRUBIN URINE: NEGATIVE
GLUCOSE, UA: NEGATIVE mg/dL
Hgb urine dipstick: NEGATIVE
Ketones, ur: NEGATIVE mg/dL
Leukocytes, UA: NEGATIVE
NITRITE: NEGATIVE
PH: 6 (ref 5.0–8.0)
Protein, ur: NEGATIVE mg/dL
SPECIFIC GRAVITY, URINE: 1.004 — AB (ref 1.005–1.030)

## 2016-12-06 LAB — ETHANOL: Alcohol, Ethyl (B): 467 mg/dL (ref <5)

## 2016-12-06 LAB — MAGNESIUM: Magnesium: 1.8 mg/dL (ref 1.7–2.4)

## 2016-12-06 MED ORDER — THIAMINE HCL 100 MG/ML IJ SOLN
100.0000 mg | Freq: Every day | INTRAMUSCULAR | Status: DC
  Administered 2016-12-06: 100 mg via INTRAVENOUS
  Filled 2016-12-06: qty 2

## 2016-12-06 MED ORDER — CHLORDIAZEPOXIDE HCL 25 MG PO CAPS
100.0000 mg | ORAL_CAPSULE | Freq: Once | ORAL | Status: AC
  Administered 2016-12-06: 100 mg via ORAL
  Filled 2016-12-06: qty 4

## 2016-12-06 MED ORDER — FOLIC ACID 5 MG/ML IJ SOLN
1.0000 mg | Freq: Every day | INTRAMUSCULAR | Status: DC
  Administered 2016-12-06: 1 mg via INTRAVENOUS
  Filled 2016-12-06: qty 0.2

## 2016-12-06 MED ORDER — NALOXONE HCL 2 MG/2ML IJ SOSY
0.4000 mg | PREFILLED_SYRINGE | INTRAMUSCULAR | Status: AC
  Administered 2016-12-06: 0.4 mg via INTRAVENOUS

## 2016-12-06 NOTE — ED Provider Notes (Addendum)
-----------------------------------------   5:34 PM on 12/06/2016 -----------------------------------------  I spoke with the patient's husband with the patient's consent. He called 911 today because she has been on an alcohol binge. He said she normally takes Suboxone but when she runs out she resorts alcohol. She has been out of her Suboxone recently. He states that he is disabled and when she becomes this intoxicated she becomes violent and he is unable to care for her. I will touch base with TTS now.   Merrily Brittleifenbark, Anglia Blakley, MD 12/06/16 1734  ----------------------------------------- 10:09 PM on 12/06/2016 -----------------------------------------  The patient became somewhat tremulous when you for 100 mg of chlordiazepoxide. I discussed the case with TTS and the patient will be able to have a bed in detox and rehabilitation tomorrow at [facility]. She consents to go. I will keep her in the emergency department tonight.   Merrily Brittleifenbark, Gaylon Bentz, MD 12/06/16 2209  ----------------------------------------- 10:28 PM on 12/06/2016 -----------------------------------------  The patient is changed her mind and would like to go home. Her husband has arrived at bedside. She is not currently withdrawing from alcohol. I will provide her information for [facility] and she understands she can go tomorrow to check in.   Merrily Brittleifenbark, Jaquelyne Firkus, MD 12/06/16 2228

## 2016-12-06 NOTE — Discharge Instructions (Signed)
Please follow-up with your primary care physician next week for recheck and return to the emergency department for any concerns.  It was a pleasure to take care of you today, and thank you for coming to our emergency department.  If you have any questions or concerns before leaving please ask the nurse to grab me and I'm more than happy to go through your aftercare instructions again.  If you were prescribed any opioid pain medication today such as Norco, Vicodin, Percocet, morphine, hydrocodone, or oxycodone please make sure you do not drive when you are taking this medication as it can alter your ability to drive safely.  If you have any concerns once you are home that you are not improving or are in fact getting worse before you can make it to your follow-up appointment, please do not hesitate to call [phone] and come back for further evaluation.  Merrily Brittle MD  Results for orders placed or performed during the hospital encounter of 12/06/16  Comprehensive metabolic panel  Result Value Ref Range   Sodium 138 135 - 145 mmol/L   Potassium 3.3 (L) 3.5 - 5.1 mmol/L   Chloride 104 101 - 111 mmol/L   CO2 22 22 - 32 mmol/L   Glucose, Bld 110 (H) 65 - 99 mg/dL   BUN 12 6 - 20 mg/dL   Creatinine, Ser 1.61 0.44 - 1.00 mg/dL   Calcium 9.1 8.9 - 09.6 mg/dL   Total Protein 8.0 6.5 - 8.1 g/dL   Albumin 4.1 3.5 - 5.0 g/dL   AST 85 (H) 15 - 41 U/L   ALT 59 (H) 14 - 54 U/L   Alkaline Phosphatase 89 38 - 126 U/L   Total Bilirubin 0.6 0.3 - 1.2 mg/dL   GFR calc non Af Amer >60 >60 mL/min   GFR calc Af Amer >60 >60 mL/min   Anion gap 12 5 - 15  Ethanol  Result Value Ref Range   Alcohol, Ethyl (B) 467 (HH) <5 mg/dL  cbc  Result Value Ref Range   WBC 10.2 3.6 - 11.0 K/uL   RBC 5.01 3.80 - 5.20 MIL/uL   Hemoglobin 17.0 (H) 12.0 - 16.0 g/dL   HCT 04.5 (H) 40.9 - 81.1 %   MCV 98.1 80.0 - 100.0 fL   MCH 33.9 26.0 - 34.0 pg   MCHC 34.6 32.0 - 36.0 g/dL   RDW 91.4 78.2 - 95.6 %   Platelets 259 150  - 440 K/uL  Magnesium  Result Value Ref Range   Magnesium 1.8 1.7 - 2.4 mg/dL  Urinalysis, Routine w reflex microscopic  Result Value Ref Range   Color, Urine STRAW (A) YELLOW   APPearance CLEAR (A) CLEAR   Specific Gravity, Urine 1.004 (L) 1.005 - 1.030   pH 6.0 5.0 - 8.0   Glucose, UA NEGATIVE NEGATIVE mg/dL   Hgb urine dipstick NEGATIVE NEGATIVE   Bilirubin Urine NEGATIVE NEGATIVE   Ketones, ur NEGATIVE NEGATIVE mg/dL   Protein, ur NEGATIVE NEGATIVE mg/dL   Nitrite NEGATIVE NEGATIVE   Leukocytes, UA NEGATIVE NEGATIVE  Urine Drug Screen, Qualitative  Result Value Ref Range   Tricyclic, Ur Screen NONE DETECTED NONE DETECTED   Amphetamines, Ur Screen NONE DETECTED NONE DETECTED   MDMA (Ecstasy)Ur Screen NONE DETECTED NONE DETECTED   Cocaine Metabolite,Ur [place] NONE DETECTED NONE DETECTED   Opiate, Ur Screen NONE DETECTED NONE DETECTED   Phencyclidine (PCP) Ur S NONE DETECTED NONE DETECTED   Cannabinoid 50 Ng, Ur [place] NONE DETECTED  NONE DETECTED   Barbiturates, Ur Screen NONE DETECTED NONE DETECTED   Benzodiazepine, Ur Scrn NONE DETECTED NONE DETECTED   Methadone Scn, Ur NONE DETECTED NONE DETECTED

## 2016-12-06 NOTE — ED Notes (Signed)
BEHAVIORAL HEALTH ROUNDING Patient sleeping: No. Patient alert - with stimulation  yes Behavior appropriate: Yes.  ; If no, describe:  Nutrition and fluids offered: yes Toileting and hygiene offered: Yes  Sitter present: q15 minute observations and security monitoring Law enforcement present: Yes  ODS  ENVIRONMENTAL ASSESSMENT Potentially harmful objects out of patient reach: Yes.   Personal belongings secured: Yes.   Patient dressed in hospital provided attire only: Yes.   Plastic bags out of patient reach: Yes.   Patient care equipment (cords, cables, call bells, lines, and drains) shortened, removed, or accounted for: see note .   Equipment and supplies removed from bottom of stretcher: Yes.   Potentially toxic materials out of patient reach: Yes.   Sharps container removed or out of patient reach: Yes  She is on th e cardiac monitor - 3 lines from monitor  [place] with 6L oxygen  - 4 lines total   Pt .

## 2016-12-06 NOTE — ED Notes (Signed)
Pt requesting "something to help me calm down." MD notified. Orders provided, see MAR.

## 2016-12-06 NOTE — ED Triage Notes (Signed)
She arrives today via [facility] from home where she was found intoxicated and with decreased level of consciousness   Family reported to ems that the pt has been drinking "heavy " since Sunday and that she takes suboxone everyday    Pt lethargic upon arrival

## 2016-12-06 NOTE — ED Notes (Signed)
BEHAVIORAL HEALTH ROUNDING Patient sleeping: No. Patient alert : yes Behavior appropriate: Yes.  ; If no, describe:  Nutrition and fluids offered: yes Toileting and hygiene offered: Yes  Sitter present: q15 minute observations and security monitoring Law enforcement present: Yes  ODS  

## 2016-12-06 NOTE — BH Assessment (Signed)
TTS Counselor call [facility] to inquire about female bed availability [phone] .  This Clinical research associatewriter was informed by Misty StanleyLisa, Intake RN at [facility],  that they have beds but would not be taking any admissions until in the morning.  Pt information faxed to [facility] for review for placement.  Patient will be discharged from the ED and has been advised to follow up with [facility] in the morning.

## 2016-12-06 NOTE — ED Notes (Signed)

## 2016-12-06 NOTE — ED Notes (Signed)
BEHAVIORAL HEALTH ROUNDING Patient sleeping: No. Patient alert and oriented: yes Behavior appropriate: Yes.  ; If no, describe:  Nutrition and fluids offered: yes Toileting and hygiene offered: Yes  Sitter present: q15 minute observations and security  monitoring Law enforcement present: Yes  ODS  

## 2016-12-06 NOTE — ED Provider Notes (Signed)
[facility] Emergency Department Provider Note  ____________________________________________   First MD Initiated Contact with Patient 12/06/16 1404     (approximate)  I have reviewed the triage vital signs and the nursing notes.   HISTORY  Chief Complaint Alcohol Intoxication and Addiction Problem  Level 5 caveat:  history/ROS limited by acute intoxication  HPI Nicole [name] is a 56 y.o. female who presents by EMS for decreased responsiveness and what the family describes as acute intoxication.  She was reportedly found surrounded by multiple empty beer cans or bottles.  No one is certain how much she has been drinking but the family reported that she has been drinking for several days.  She also reportedly takes Suboxone every day.She is minimally responsive upon arrival but will respond to loud voice and painful stimuli.  There is no evidence of trauma.  No other history is available.   Past Medical History:  Diagnosis Date  . Anxiety   . Asthma   . Collagen vascular disease [facility])     Patient Active Problem List   Diagnosis Date Noted  . Alcohol abuse 10/22/2016  . 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 Cocaine abuse 10/22/2016  . Cellulitis and abscess 10/06/2015  . Sepsis (HCC) 10/06/2015  . Anxiety 10/06/2015  . Asthma 10/06/2015  . Cellulitis     Past Surgical History:  Procedure Laterality Date  . ABDOMINAL HYSTERECTOMY    . FOOT SURGERY      Prior to Admission medications   Medication Sig Start Date End Date Taking? Authorizing Provider  baclofen (LIORESAL) 10 MG tablet Take 1 tablet by mouth 3 (three) times daily as needed. 10/13/16   [provider]  BELSOMRA 15 MG TABS Take 1 tablet by mouth at bedtime as needed.  09/16/15   [provider]  clonazePAM (KLONOPIN) 0.5 MG tablet Take 0.5 mg by mouth daily. 08/09/16   [provider]  DULoxetine (CYMBALTA) 30 MG capsule Take 30 mg  by mouth daily.    [provider]  gabapentin (NEURONTIN) 300 MG capsule Take 300 mg by mouth 4 (four) times daily. 300mg  with meals, 1200mg  at bedtime    [provider]  Ibuprofen-Diphenhydramine Cit (ADVIL PM PO) Take 1 tablet by mouth every evening.    [provider]  oxyCODONE-acetaminophen (PERCOCET/ROXICET) 5-325 MG tablet Take 1 tablet by mouth every 6 (six) hours as needed for moderate pain or severe pain. Patient not taking: Reported on 10/20/2016 10/09/15   Milagros Loll, MD  QUEtiapine (SEROQUEL) 200 MG tablet Take 200 mg by mouth at bedtime. 100mg  3 times daily, 300mg  at bedtime    [provider]  SUBOXONE 8-2 MG FILM Place 1 Film under the tongue 2 (two) times daily. 10/08/16   [provider]    Allergies Patient has no known allergies.  Family History  Problem Relation Age of Onset  . Rheum arthritis Unknown   . Osteoporosis Unknown   . Clotting disorder Unknown   . Stroke Unknown     Social History Social History  Substance Use Topics  . Smoking status: Current Every Day Smoker  . Smokeless tobacco: Never Used  . Alcohol use No    Review of Systems Level 5 caveat:  history/ROS limited by acute intoxication ____________________________________________   PHYSICAL EXAM:  VITAL SIGNS: ED Triage Vitals  Enc Vitals Group     BP 12/06/16 1350 121/84     Pulse Rate 12/06/16 1350 87  Resp 12/06/16 1350 17     Temp 12/06/16 1350 98.2 F (36.8 C)     Temp Source 12/06/16 1350 Oral     SpO2 12/06/16 1350 94 %     Weight 12/06/16 1351 56.7 kg (125 lb)     Height 12/06/16 1351 1.676 m (5\' 6" )     Head Circumference --      Peak Flow --      Pain Score --      Pain Loc --      Pain Edu? --      Excl. in GC? --     Constitutional: Obtunded.  Has the appearance of chronic illness and is minimally responsive Eyes: Conjunctivae are normal.  Pupils are not mydriatic but are sluggish and minimally responsive to  light Head: Atraumatic. Nose: No congestion/rhinnorhea. Mouth/Throat: Mucous membranes are moist. Cardiovascular: Normal rate, regular rhythm. Good peripheral circulation. Grossly normal heart sounds. Respiratory: Normal respiratory effort.  Protecting airway at this time.  No retractions. Lungs CTAB. Gastrointestinal: Soft and nontender. No distention.  Musculoskeletal: No lower extremity tenderness nor edema. No gross deformities of extremities. Neurologic:  Patient Is pain and neurological exam but there are no obvious gross neurological deficits.  She localizes to painful stimuli and is able to tell me her name when asked.  She does have a gag reflex and in fact grabbed the tongue blade when I tested her Skin:  Skin is warm, dry and intact. No rash noted.   ____________________________________________   LABS (all labs ordered are listed, but only abnormal results are displayed)  Labs Reviewed  COMPREHENSIVE METABOLIC PANEL - Abnormal; Notable for the following:       Result Value   Potassium 3.3 (*)    Glucose, Bld 110 (*)    AST 85 (*)    ALT 59 (*)    All other components within normal limits  ETHANOL - Abnormal; Notable for the following:    Alcohol, Ethyl (B) 467 (*)    All other components within normal limits  CBC - Abnormal; Notable for the following:    Hemoglobin 17.0 (*)    HCT 49.2 (*)    All other components within normal limits  MAGNESIUM  URINE DRUG SCREEN, QUALITATIVE ([facility] ONLY)   ____________________________________________  EKG  None - EKG not ordered by ED physician ____________________________________________  RADIOLOGY   No results found.  ____________________________________________   PROCEDURES  Critical Care performed: Yes, see critical care procedure note(s)   Procedure(s) performed:   .Critical Care Performed by: Loleta Rose Authorized by: Loleta Rose   Critical care provider statement:    Critical care time (minutes):   30   Critical care time was exclusive of:  Separately billable procedures and treating other patients   Critical care was necessary to treat or prevent imminent or life-threatening deterioration of the following conditions:  Toxidrome   Critical care was time spent personally by me on the following activities:  Development of treatment plan with patient or surrogate, discussions with consultants, evaluation of patient's response to treatment, examination of patient, obtaining history from patient or surrogate, ordering and performing treatments and interventions, ordering and review of laboratory studies, ordering and review of radiographic studies, pulse oximetry, re-evaluation of patient's condition and review of old charts      ____________________________________________   INITIAL IMPRESSION / ASSESSMENT AND PLAN / ED COURSE  Pertinent labs & imaging results that were available during my care of the patient were reviewed by me  and considered in my medical decision making (see chart for details).  Initially I was concerned that the patient would require intubation.  However after Narcan 0.4 mg IV she was slightly more responsive, but even before the Narcan she will respond to painful stimuli and does have a gag reflex.  She is on the monitor and on pulse oximeter.  Her ethanol level is 467  Urine drug screen is pending.  She requires extended period of ED observation to determine which substances she may have ingested and whether or not shdmission or aairway placement.  I ordered Thiamine 100 mg IV and Folic acid 1 mg IV, and she is on CIWA protocol.  Of note, the patient was able to tell me she drank alcohol, and denies suicidal ideation/attempt.   Clinical Course as of Dec 07 1515  Thu Dec 06, 2016  1516 OBSERVATION CARE: This patient is being placed under observation care for the following reasons: Questionable overdose observed to r/o significant toxicity  Transferring ED care to  Dr. Lamont Snowballifenbark.   [CF]    Clinical Course User Index [CF] Loleta RoseForbach, Kartel Wolbert, MD    ____________________________________________  FINAL CLINICAL IMPRESSION(S) / ED DIAGNOSES  Final diagnoses:  Alcoholic intoxication with complication (HCC)     MEDICATIONS GIVEN DURING THIS VISIT:  Medications  thiamine (B-1) injection 100 mg (not administered)  folic acid injection 1 mg (not administered)  naloxone (NARCAN) injection 0.4 mg (0.4 mg Intravenous Given 12/06/16 1424)     NEW OUTPATIENT MEDICATIONS STARTED DURING THIS VISIT:  New Prescriptions   No medications on file    Modified Medications   No medications on file    Discontinued Medications   AMOXICILLIN-CLAVULANATE (AUGMENTIN) 875-125 MG TABLET    Take 1 tablet by mouth 2 (two) times daily.     Note:  This document was prepared using Dragon voice recognition software and may include unintentional dictation errors.    Loleta RoseForbach, Kerina [place]oneau, MD 12/06/16 [phone]

## 2016-12-06 NOTE — ED Notes (Signed)
ivc rescinded  

## 2016-12-06 NOTE — ED Notes (Signed)
VOL  

## 2016-12-06 NOTE — ED Notes (Signed)
Dinner meal given to the patient 

## 2017-05-08 ENCOUNTER — Other Ambulatory Visit: Payer: Self-pay

## 2017-05-08 ENCOUNTER — Emergency Department: Payer: Self-pay

## 2017-05-08 DIAGNOSIS — I10 Essential (primary) hypertension: Secondary | ICD-10-CM | POA: Insufficient documentation

## 2017-05-08 DIAGNOSIS — Z79899 Other long term (current) drug therapy: Secondary | ICD-10-CM | POA: Insufficient documentation

## 2017-05-08 DIAGNOSIS — J45909 Unspecified asthma, uncomplicated: Secondary | ICD-10-CM | POA: Insufficient documentation

## 2017-05-08 DIAGNOSIS — R609 Edema, unspecified: Secondary | ICD-10-CM | POA: Insufficient documentation

## 2017-05-08 DIAGNOSIS — J181 Lobar pneumonia, unspecified organism: Secondary | ICD-10-CM | POA: Insufficient documentation

## 2017-05-08 DIAGNOSIS — F17200 Nicotine dependence, unspecified, uncomplicated: Secondary | ICD-10-CM | POA: Insufficient documentation

## 2017-05-08 LAB — BASIC METABOLIC PANEL
Anion gap: 10 (ref 5–15)
BUN: 31 mg/dL — ABNORMAL HIGH (ref 6–20)
CALCIUM: 8.6 mg/dL — AB (ref 8.9–10.3)
CHLORIDE: 105 mmol/L (ref 101–111)
CO2: 23 mmol/L (ref 22–32)
CREATININE: 1.69 mg/dL — AB (ref 0.44–1.00)
GFR, EST AFRICAN AMERICAN: 39 mL/min — AB (ref >60)
GFR, EST NON AFRICAN AMERICAN: 33 mL/min — AB (ref >60)
Glucose, Bld: 85 mg/dL (ref 65–99)
Potassium: 4.3 mmol/L (ref 3.5–5.1)
SODIUM: 138 mmol/L (ref 135–145)

## 2017-05-08 LAB — CBC
HCT: 33 % — ABNORMAL LOW (ref 35.0–47.0)
Hemoglobin: 11.1 g/dL — ABNORMAL LOW (ref 12.0–16.0)
MCH: 34.3 pg — ABNORMAL HIGH (ref 26.0–34.0)
MCHC: 33.7 g/dL (ref 32.0–36.0)
MCV: 101.9 fL — ABNORMAL HIGH (ref 80.0–100.0)
PLATELETS: 165 10*3/uL (ref 150–440)
RBC: 3.24 MIL/uL — AB (ref 3.80–5.20)
RDW: 13.6 % (ref 11.5–14.5)
WBC: 5.5 10*3/uL (ref 3.6–11.0)

## 2017-05-08 LAB — TROPONIN I

## 2017-05-08 NOTE — ED Triage Notes (Signed)
Pt arrives to ED via POV with c/o CP, SHOB, and bilateral LE edema since yesterday. Pt reports h/x of COPD and HTN, but denies any h/x of CHF. Pt reports left sided CP with "some" radiation into the left arm. Pt denies N/V/D or fever. Pt is A&O, in NAD; RR even, regular, and unlabored; skin color/temp is WNL.

## 2017-05-09 ENCOUNTER — Emergency Department
Admission: EM | Admit: 2017-05-09 | Discharge: 2017-05-09 | Disposition: A | Discharge status: Home / Self Care | Payer: Self-pay | Attending: Emergency Medicine | Admitting: Emergency Medicine

## 2017-05-09 DIAGNOSIS — I10 Essential (primary) hypertension: Secondary | ICD-10-CM

## 2017-05-09 DIAGNOSIS — R609 Edema, unspecified: Secondary | ICD-10-CM

## 2017-05-09 DIAGNOSIS — J189 Pneumonia, unspecified organism: Secondary | ICD-10-CM

## 2017-05-09 DIAGNOSIS — J181 Lobar pneumonia, unspecified organism: Secondary | ICD-10-CM

## 2017-05-09 MED ORDER — AZITHROMYCIN 250 MG PO TABS: ORAL_TABLET | ORAL | 0 refills | Status: AC

## 2017-05-09 MED ORDER — BENZONATATE 100 MG PO CAPS: 100.0000 mg | ORAL_CAPSULE | Freq: Three times a day (TID) | ORAL | 0 refills | Status: AC | PRN

## 2017-05-09 MED ORDER — TRIAMTERENE-HCTZ 37.5-25 MG PO TABS
1.0000 | ORAL_TABLET | Freq: Every day | ORAL | Status: DC
  Administered 2017-05-09: 1 via ORAL
  Filled 2017-05-09 (×2): qty 1

## 2017-05-09 MED ORDER — AZITHROMYCIN 500 MG PO TABS
500.0000 mg | ORAL_TABLET | Freq: Once | ORAL | Status: AC
  Administered 2017-05-09: 500 mg via ORAL
  Filled 2017-05-09: qty 1

## 2017-05-09 MED ORDER — LISINOPRIL 10 MG PO TABS: 10.0000 mg | ORAL_TABLET | Freq: Every day | ORAL | 0 refills | Status: DC

## 2017-05-09 NOTE — ED Notes (Signed)
Pt to the er for chest pain, cough and peripheral edema. Pt has 1 plus pitting edema to lower legs bilaterally. Cough is productive and green. No sinus drainage.

## 2017-05-09 NOTE — ED Provider Notes (Signed)
[facility] Emergency Department Provider Note   First MD Initiated Contact with Patient 05/09/17 [phone]     (approximate)  I have reviewed the triage vital signs and the nursing notes.   HISTORY  Chief Complaint Chest Pain; Shortness of Breath; and Leg Swelling    HPI Nicole [name] is a 56 y.o. female with below list of chronic medical conditions presents to the emergency department withcentral chest discomfort is nonradiating associated with coughand 2 days. Patient denies any fever. Patient also admits to bilateral lower extremity edema. Patient states no cocaine use in the last month.   Past Medical History:  Diagnosis Date  . Anxiety   . Asthma   . Collagen vascular disease [facility])     Patient Active Problem List   Diagnosis Date Noted  . Alcohol abuse 10/22/2016  . 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 Cocaine abuse (HCC) 10/22/2016  . Cellulitis and abscess 10/06/2015  . Sepsis (HCC) 10/06/2015  . Anxiety 10/06/2015  . Asthma 10/06/2015  . Cellulitis     Past Surgical History:  Procedure Laterality Date  . ABDOMINAL HYSTERECTOMY    . FOOT SURGERY      Prior to Admission medications   Medication Sig Start Date End Date Taking? Authorizing Provider  baclofen (LIORESAL) 10 MG tablet Take 1 tablet by mouth 3 (three) times daily as needed. 10/13/16   [provider]  BELSOMRA 15 MG TABS Take 1 tablet by mouth at bedtime as needed.  09/16/15   [provider]  clonazePAM (KLONOPIN) 0.5 MG tablet Take 0.5 mg by mouth daily. 08/09/16   [provider]  DULoxetine (CYMBALTA) 30 MG capsule Take 30 mg by mouth daily.    [provider]  gabapentin (NEURONTIN) 300 MG capsule Take 300 mg by mouth 4 (four) times daily. 300mg  with meals, 1200mg  at bedtime    [provider]  Ibuprofen-Diphenhydramine Cit (ADVIL PM PO) Take 1 tablet by mouth every evening.    [provider]    oxyCODONE-acetaminophen (PERCOCET/ROXICET) 5-325 MG tablet Take 1 tablet by mouth every 6 (six) hours as needed for moderate pain or severe pain. Patient not taking: Reported on 10/20/2016 10/09/15   Milagros Loll, MD  QUEtiapine (SEROQUEL) 200 MG tablet Take 200 mg by mouth at bedtime. 100mg  3 times daily, 300mg  at bedtime    [provider]  SUBOXONE 8-2 MG FILM Place 1 Film under the tongue 2 (two) times daily. 10/08/16   [provider]    Allergies no known drug allergies  Family History  Problem Relation Age of Onset  . Rheum arthritis Unknown   . Osteoporosis Unknown   . Clotting disorder Unknown   . Stroke Unknown     Social History Social History   Tobacco Use  . Smoking status: Current Every Day Smoker  . Smokeless tobacco: Never Used  Substance Use Topics  . Alcohol use: No  . Drug use: No    Review of Systems Constitutional: No fever/chills Eyes: No visual changes. ENT: No sore throat. Cardiovascular: positive for chest pain. Respiratory: Denies shortness of breath.positive for cough Gastrointestinal: No abdominal pain.  No nausea, no vomiting.  No diarrhea.  No constipation. Genitourinary: Negative for dysuria. Musculoskeletal: Negative for neck pain.  Negative for back pain. Integumentary: Negative for rash. Neurological: Negative for headaches, focal weakness or numbness.   ____________________________________________   PHYSICAL EXAM:  VITAL SIGNS: ED Triage Vitals  Enc Vitals Group  BP 05/08/17 2202 (!) 179/102     Pulse Rate 05/08/17 2202 (!) 105     Resp 05/08/17 2202 18     Temp 05/08/17 2202 98.7 F (37.1 C)     Temp Source 05/08/17 2202 Oral     SpO2 05/08/17 2202 94 %     Weight --      Height --      Head Circumference --      Peak Flow --      Pain Score 05/08/17 2206 6     Pain Loc --      Pain Edu? --      Excl. in GC? --     Constitutional: Alert and oriented. Well appearing and in no acute  distress. Eyes: Conjunctivae are normal.  Head: Atraumatic. Mouth/Throat: Mucous membranes are moist. Oropharynx non-erythematous. Neck: No stridor.   Cardiovascular: Normal rate, regular rhythm. Good peripheral circulation. Grossly normal heart sounds. Respiratory: Normal respiratory effort.  No retractions. diffuse rhonchi Gastrointestinal: Soft and nontender. No distention.  Musculoskeletal: No lower extremity tenderness nor edema. No gross deformities of extremities. Neurologic:  Normal speech and language. No gross focal neurologic deficits are appreciated.  Skin:  Skin is warm, dry and intact. No rash noted. Psychiatric: Mood and affect are normal. Speech and behavior are normal.  ____________________________________________   LABS (all labs ordered are listed, but only abnormal results are displayed)  Labs Reviewed  BASIC METABOLIC PANEL - Abnormal; Notable for the following components:      Result Value   BUN 31 (*)    Creatinine, Ser 1.69 (*)    Calcium 8.6 (*)    GFR calc non Af Amer 33 (*)    GFR calc Af Amer 39 (*)    All other components within normal limits  CBC - Abnormal; Notable for the following components:   RBC 3.24 (*)    Hemoglobin 11.1 (*)    HCT 33.0 (*)    MCV 101.9 (*)    MCH 34.3 (*)    All other components within normal limits  TROPONIN I   ____________________________________________  EKG  ED ECG REPORT I, [place] Liona Wengert, the attending physician, personally viewed and interpreted this ECG.   Date: 05/09/2017  EKG Time: 10:07 PM  Rate: 97  Rhythm: normal sinus rhythm  Axis: normal  Intervals:normal  ST&T Change: none  ____________________________________________  RADIOLOGY I, [place] Jerrie Gullo, personally viewed and evaluated these images (plain radiographs) as part of my medical decision making, as well as reviewing the written report by the radiologist.  Dg Chest 2 View  Result Date: 05/08/2017 CLINICAL DATA:  Chest pain  shortness of breath EXAM: CHEST  2 VIEW COMPARISON:  10/20/2016 FINDINGS: Hyperinflation. Coarse interstitial opacity consistent of bronchial inflammation. Small focus of atelectasis or mild infiltrate at the right base. No pleural effusion. Stable cardiomediastinal silhouette. No pneumothorax. IMPRESSION: 1. Mild diffuse interstitial opacity consistent with bronchial inflammation. There is a small focal opacity at the right base which may reflect atelectasis or a small infiltrate. Electronically Signed   By: Jasmine PangKim  Fujinaga M.D.   On: 05/08/2017 22:34    Procedures   ____________________________________________   INITIAL IMPRESSION / ASSESSMENT AND PLAN / ED COURSE  As part of my medical decision making, I reviewed the following data within the electronic MEDICAL RECORD [id number] year old female presenting with above stated history of physical exam concerning for possible pneumonia versus bronchitis. Chest x-ray consistent with both etiologies and a such patient given azithromycin  in the emergency department will be prescribed the same for home. Patient states that she has known hypertension was unable to see her doctor.     ____________________________________________  FINAL CLINICAL IMPRESSION(S) / ED DIAGNOSES  Final diagnoses:  Peripheral edema  Hypertension, unspecified type  Community acquired pneumonia of left lower lobe of lung (HCC)     MEDICATIONS GIVEN DURING THIS VISIT:  Medications  azithromycin (ZITHROMAX) tablet 500 mg (not administered)  triamterene-hydrochlorothiazide (MAXZIDE-25) 37.5-25 MG per tablet 1 tablet (not administered)     ED Discharge Orders    None       Note:  This document was prepared using Dragon voice recognition software and may include unintentional dictation errors.    Darci CurrentBrown, [place] N, MD 05/09/17 [phone]

## 2018-09-28 ENCOUNTER — Encounter: Payer: Self-pay | Admitting: Emergency Medicine

## 2018-09-28 ENCOUNTER — Emergency Department
Admission: EM | Admit: 2018-09-28 | Discharge: 2018-09-28 | Discharge status: Against Medical Advice | Payer: Self-pay | Attending: Emergency Medicine | Admitting: Emergency Medicine

## 2018-09-28 ENCOUNTER — Other Ambulatory Visit: Payer: Self-pay

## 2018-09-28 ENCOUNTER — Ambulatory Visit: Admission: RE | Admit: 2018-09-28 | Payer: Self-pay | Source: Ambulatory Visit

## 2018-09-28 DIAGNOSIS — Z5321 Procedure and treatment not carried out due to patient leaving prior to being seen by health care provider: Secondary | ICD-10-CM | POA: Insufficient documentation

## 2018-09-28 DIAGNOSIS — F10929 Alcohol use, unspecified with intoxication, unspecified: Secondary | ICD-10-CM | POA: Insufficient documentation

## 2018-09-28 DIAGNOSIS — R079 Chest pain, unspecified: Secondary | ICD-10-CM | POA: Insufficient documentation

## 2018-09-28 HISTORY — DX: Chronic obstructive pulmonary disease, unspecified: J44.9

## 2018-09-28 HISTORY — DX: Unspecified viral hepatitis C without hepatic coma: B19.20

## 2018-09-28 HISTORY — DX: Essential (primary) hypertension: I10

## 2018-09-28 LAB — BASIC METABOLIC PANEL
ANION GAP: 14 (ref 5–15)
BUN: 19 mg/dL (ref 6–20)
CALCIUM: 8.8 mg/dL — AB (ref 8.9–10.3)
CO2: 23 mmol/L (ref 22–32)
Chloride: 106 mmol/L (ref 98–111)
Creatinine, Ser: 1 mg/dL (ref 0.44–1.00)
GFR calc Af Amer: >60 mL/min (ref >60)
GLUCOSE: 96 mg/dL (ref 70–99)
POTASSIUM: 4 mmol/L (ref 3.5–5.1)
SODIUM: 143 mmol/L (ref 135–145)

## 2018-09-28 LAB — CBC
HCT: 45.5 % (ref 36.0–46.0)
Hemoglobin: 15.5 g/dL — ABNORMAL HIGH (ref 12.0–15.0)
MCH: 33.3 pg (ref 26.0–34.0)
MCHC: 34.1 g/dL (ref 30.0–36.0)
MCV: 97.8 fL (ref 80.0–100.0)
PLATELETS: 204 10*3/uL (ref 150–400)
RBC: 4.65 MIL/uL (ref 3.87–5.11)
RDW: 13.3 % (ref 11.5–15.5)
WBC: 4.8 10*3/uL (ref 4.0–10.5)
nRBC: 0 % (ref 0.0–0.2)

## 2018-09-28 LAB — ETHANOL: Alcohol, Ethyl (B): 372 mg/dL (ref <10)

## 2018-09-28 LAB — TROPONIN I

## 2018-09-28 MED ORDER — SODIUM CHLORIDE 0.9% FLUSH: 3.0000 mL | Freq: Once | INTRAVENOUS | Status: DC

## 2018-09-28 NOTE — ED Notes (Signed)
Pt reports that she has no more alcohol at home due to "I drank it all".

## 2018-09-28 NOTE — ED Notes (Signed)
Assisted pt with phone to call her husband; pt says she wants him to come get her because she's "lonely"; I took another pt to a treatment room and pt was sitting outside on bench when I returned; pt says she will probably leave when he gets here, she's not sure; told pt I'd come out again to talk with them both when he gets here and urged pt to stay to see provider; pt thanked me for helping her;

## 2018-09-28 NOTE — ED Triage Notes (Signed)
Pt arrives via [facility] with c/o of sudden onset chest pain upon arrival to triage and drinking a fifth of whiskey today. Pt states that she is here because she "needs help". Per EMS, pt VS [facility]. Pt states that she has tried detox before but had a hard time upon getting home. Pt is in NAD.

## 2018-12-10 ENCOUNTER — Encounter: Payer: Self-pay | Admitting: Emergency Medicine

## 2018-12-10 ENCOUNTER — Emergency Department: Payer: Self-pay

## 2018-12-10 ENCOUNTER — Inpatient Hospital Stay
Admission: EM | Admit: 2018-12-10 | Discharge: 2018-12-12 | DRG: 305 | Disposition: A | Discharge status: Home / Self Care | Payer: Self-pay | Attending: Internal Medicine | Admitting: Internal Medicine

## 2018-12-10 ENCOUNTER — Other Ambulatory Visit: Payer: Self-pay

## 2018-12-10 DIAGNOSIS — R000 Tachycardia, unspecified: Secondary | ICD-10-CM | POA: Diagnosis present

## 2018-12-10 DIAGNOSIS — F10239 Alcohol dependence with withdrawal, unspecified: Secondary | ICD-10-CM | POA: Diagnosis present

## 2018-12-10 DIAGNOSIS — F411 Generalized anxiety disorder: Secondary | ICD-10-CM | POA: Diagnosis present

## 2018-12-10 DIAGNOSIS — F419 Anxiety disorder, unspecified: Secondary | ICD-10-CM | POA: Diagnosis present

## 2018-12-10 DIAGNOSIS — Z79899 Other long term (current) drug therapy: Secondary | ICD-10-CM

## 2018-12-10 DIAGNOSIS — Z9119 Patient's noncompliance with other medical treatment and regimen: Secondary | ICD-10-CM

## 2018-12-10 DIAGNOSIS — F1010 Alcohol abuse, uncomplicated: Secondary | ICD-10-CM | POA: Diagnosis present

## 2018-12-10 DIAGNOSIS — I158 Other secondary hypertension: Principal | ICD-10-CM | POA: Diagnosis present

## 2018-12-10 DIAGNOSIS — Y906 Blood alcohol level of 120-199 mg/100 ml: Secondary | ICD-10-CM | POA: Diagnosis present

## 2018-12-10 DIAGNOSIS — J449 Chronic obstructive pulmonary disease, unspecified: Secondary | ICD-10-CM | POA: Diagnosis present

## 2018-12-10 DIAGNOSIS — I10 Essential (primary) hypertension: Secondary | ICD-10-CM | POA: Diagnosis present

## 2018-12-10 DIAGNOSIS — G4700 Insomnia, unspecified: Secondary | ICD-10-CM | POA: Diagnosis present

## 2018-12-10 DIAGNOSIS — F1090 Alcohol use, unspecified, uncomplicated: Secondary | ICD-10-CM | POA: Diagnosis present

## 2018-12-10 DIAGNOSIS — Z1159 Encounter for screening for other viral diseases: Secondary | ICD-10-CM

## 2018-12-10 DIAGNOSIS — F17210 Nicotine dependence, cigarettes, uncomplicated: Secondary | ICD-10-CM | POA: Diagnosis present

## 2018-12-10 DIAGNOSIS — Z90710 Acquired absence of both cervix and uterus: Secondary | ICD-10-CM

## 2018-12-10 DIAGNOSIS — Z791 Long term (current) use of non-steroidal anti-inflammatories (NSAID): Secondary | ICD-10-CM

## 2018-12-10 LAB — BASIC METABOLIC PANEL
Anion gap: 15 (ref 5–15)
BUN: 24 mg/dL — ABNORMAL HIGH (ref 6–20)
CO2: 21 mmol/L — ABNORMAL LOW (ref 22–32)
Calcium: 9 mg/dL (ref 8.9–10.3)
Chloride: 104 mmol/L (ref 98–111)
Creatinine, Ser: 0.85 mg/dL (ref 0.44–1.00)
GFR calc Af Amer: >60 mL/min (ref >60)
GFR calc non Af Amer: >60 mL/min (ref >60)
Glucose, Bld: 132 mg/dL — ABNORMAL HIGH (ref 70–99)
Potassium: 3.5 mmol/L (ref 3.5–5.1)
Sodium: 140 mmol/L (ref 135–145)

## 2018-12-10 LAB — CBC
HCT: 41.8 % (ref 36.0–46.0)
Hemoglobin: 14.7 g/dL (ref 12.0–15.0)
MCH: 33.2 pg (ref 26.0–34.0)
MCHC: 35.2 g/dL (ref 30.0–36.0)
MCV: 94.4 fL (ref 80.0–100.0)
Platelets: 256 10*3/uL (ref 150–400)
RBC: 4.43 MIL/uL (ref 3.87–5.11)
RDW: 13 % (ref 11.5–15.5)
WBC: 6.1 10*3/uL (ref 4.0–10.5)
nRBC: 0 % (ref 0.0–0.2)

## 2018-12-10 LAB — URINE DRUG SCREEN, QUALITATIVE (ARMC ONLY)
Amphetamines, Ur Screen: NOT DETECTED
Barbiturates, Ur Screen: NOT DETECTED
Benzodiazepine, Ur Scrn: NOT DETECTED
Cannabinoid 50 Ng, Ur ~~LOC~~: NOT DETECTED
Cocaine Metabolite,Ur ~~LOC~~: NOT DETECTED
MDMA (Ecstasy)Ur Screen: NOT DETECTED
Methadone Scn, Ur: NOT DETECTED
Opiate, Ur Screen: NOT DETECTED
Phencyclidine (PCP) Ur S: NOT DETECTED
Tricyclic, Ur Screen: NOT DETECTED

## 2018-12-10 LAB — TROPONIN I
Troponin I: <0.03 ng/mL (ref <0.03)
Troponin I: <0.03 ng/mL (ref <0.03)
Troponin I: <0.03 ng/mL (ref <0.03)
Troponin I: <0.03 ng/mL (ref <0.03)

## 2018-12-10 LAB — ETHANOL: Alcohol, Ethyl (B): 198 mg/dL — ABNORMAL HIGH (ref <10)

## 2018-12-10 MED ORDER — FOLIC ACID 1 MG PO TABS
1.0000 mg | ORAL_TABLET | Freq: Every day | ORAL | Status: DC
  Administered 2018-12-10 – 2018-12-12 (×3): 1 mg via ORAL
  Filled 2018-12-10 (×3): qty 1

## 2018-12-10 MED ORDER — ALBUTEROL SULFATE (2.5 MG/3ML) 0.083% IN NEBU: 2.5000 mg | INHALATION_SOLUTION | RESPIRATORY_TRACT | Status: DC | PRN

## 2018-12-10 MED ORDER — ONDANSETRON HCL 4 MG PO TABS: 4.0000 mg | ORAL_TABLET | Freq: Four times a day (QID) | ORAL | Status: DC | PRN

## 2018-12-10 MED ORDER — POLYETHYLENE GLYCOL 3350 17 G PO PACK: 17.0000 g | PACK | Freq: Every day | ORAL | Status: DC | PRN

## 2018-12-10 MED ORDER — LORAZEPAM 2 MG/ML IJ SOLN
1.0000 mg | Freq: Once | INTRAMUSCULAR | Status: AC
  Administered 2018-12-10: 1 mg via INTRAVENOUS
  Filled 2018-12-10: qty 1

## 2018-12-10 MED ORDER — AMITRIPTYLINE HCL 25 MG PO TABS
25.0000 mg | ORAL_TABLET | Freq: Every evening | ORAL | Status: DC | PRN
  Administered 2018-12-11 (×2): 25 mg via ORAL
  Filled 2018-12-10 (×2): qty 1

## 2018-12-10 MED ORDER — VITAMIN B-1 100 MG PO TABS
100.0000 mg | ORAL_TABLET | Freq: Every day | ORAL | Status: DC
  Administered 2018-12-10 – 2018-12-12 (×3): 100 mg via ORAL
  Filled 2018-12-10 (×3): qty 1

## 2018-12-10 MED ORDER — SODIUM CHLORIDE 0.9% FLUSH
3.0000 mL | Freq: Once | INTRAVENOUS | Status: AC
  Administered 2018-12-10: 3 mL via INTRAVENOUS

## 2018-12-10 MED ORDER — ADULT MULTIVITAMIN W/MINERALS CH
1.0000 | ORAL_TABLET | Freq: Every day | ORAL | Status: DC
  Administered 2018-12-10 – 2018-12-11 (×2): 1 via ORAL
  Filled 2018-12-10 (×2): qty 1

## 2018-12-10 MED ORDER — ADULT MULTIVITAMIN W/MINERALS CH
1.0000 | ORAL_TABLET | Freq: Every day | ORAL | Status: DC
  Administered 2018-12-11 – 2018-12-12 (×2): 1 via ORAL
  Filled 2018-12-10: qty 1

## 2018-12-10 MED ORDER — HYDRALAZINE HCL 20 MG/ML IJ SOLN: 10.0000 mg | Freq: Four times a day (QID) | INTRAMUSCULAR | Status: DC | PRN

## 2018-12-10 MED ORDER — LORAZEPAM 1 MG PO TABS
1.0000 mg | ORAL_TABLET | ORAL | Status: DC | PRN
  Administered 2018-12-10 – 2018-12-12 (×8): 1 mg via ORAL
  Filled 2018-12-10 (×8): qty 1

## 2018-12-10 MED ORDER — ENOXAPARIN SODIUM 40 MG/0.4ML ~~LOC~~ SOLN
40.0000 mg | SUBCUTANEOUS | Status: DC
  Administered 2018-12-10 – 2018-12-11 (×2): 40 mg via SUBCUTANEOUS
  Filled 2018-12-10 (×2): qty 0.4

## 2018-12-10 MED ORDER — NITROGLYCERIN 2 % TD OINT
0.5000 [in_us] | TOPICAL_OINTMENT | Freq: Once | TRANSDERMAL | Status: AC
  Administered 2018-12-10: 0.5 [in_us] via TOPICAL
  Filled 2018-12-10: qty 1

## 2018-12-10 MED ORDER — LABETALOL HCL 5 MG/ML IV SOLN
10.0000 mg | Freq: Once | INTRAVENOUS | Status: AC
  Administered 2018-12-10: 10 mg via INTRAVENOUS
  Filled 2018-12-10: qty 4

## 2018-12-10 MED ORDER — ACETAMINOPHEN 650 MG RE SUPP: 650.0000 mg | Freq: Four times a day (QID) | RECTAL | Status: DC | PRN

## 2018-12-10 MED ORDER — ACETAMINOPHEN 325 MG PO TABS
650.0000 mg | ORAL_TABLET | Freq: Four times a day (QID) | ORAL | Status: DC | PRN
  Administered 2018-12-12 (×2): 650 mg via ORAL
  Filled 2018-12-10 (×2): qty 2

## 2018-12-10 MED ORDER — SODIUM CHLORIDE 0.9 % IV SOLN
Freq: Once | INTRAVENOUS | Status: AC
  Administered 2018-12-10: 16:00:00 via INTRAVENOUS

## 2018-12-10 MED ORDER — SODIUM CHLORIDE 0.9 % IV BOLUS
1000.0000 mL | Freq: Once | INTRAVENOUS | Status: AC
  Administered 2018-12-10: 1000 mL via INTRAVENOUS

## 2018-12-10 MED ORDER — METOPROLOL TARTRATE 25 MG PO TABS
25.0000 mg | ORAL_TABLET | Freq: Two times a day (BID) | ORAL | Status: DC
  Administered 2018-12-10 – 2018-12-11 (×3): 25 mg via ORAL
  Filled 2018-12-10 (×3): qty 1

## 2018-12-10 MED ORDER — TRAZODONE HCL 50 MG PO TABS
50.0000 mg | ORAL_TABLET | Freq: Every evening | ORAL | Status: DC | PRN
  Administered 2018-12-10: 50 mg via ORAL
  Filled 2018-12-10: qty 1

## 2018-12-10 MED ORDER — LORAZEPAM 1 MG PO TABS
1.0000 mg | ORAL_TABLET | Freq: Four times a day (QID) | ORAL | Status: DC | PRN
  Administered 2018-12-10: 1 mg via ORAL
  Filled 2018-12-10: qty 1

## 2018-12-10 MED ORDER — ONDANSETRON HCL 4 MG/2ML IJ SOLN: 4.0000 mg | Freq: Four times a day (QID) | INTRAMUSCULAR | Status: DC | PRN

## 2018-12-10 MED ORDER — ASPIRIN EC 81 MG PO TBEC
81.0000 mg | DELAYED_RELEASE_TABLET | Freq: Every day | ORAL | Status: DC
  Administered 2018-12-10 – 2018-12-12 (×3): 81 mg via ORAL
  Filled 2018-12-10 (×3): qty 1

## 2018-12-10 NOTE — ED Notes (Signed)
Pt given graham crackers and peanut butter per EDP

## 2018-12-10 NOTE — ED Provider Notes (Signed)
[facility] Emergency Department Provider Note    First MD Initiated Contact with Patient 12/10/18 [phone]     (approximate)  I have reviewed the triage vital signs and the nursing notes.   HISTORY  Chief Complaint Chest Pain and Shortness of Breath    HPI Nicole [name] is a 58 y.o. female with below list of previous medical conditions including hypertension COPD and anxiety as well as polysubstance abuse presents to the emergency department secondary to acute onset of chest pain that began 3 and half hours before arrival.  Patient states that she try to use her albuterol inhaler at home without any relief.  Patient does admit to being noncompliant with hypertensive medication times several months secondary to losing her Medicaid.        Past Medical History:  Diagnosis Date  . Anxiety   . Asthma   . Collagen vascular disease (HCC)   . COPD (chronic obstructive pulmonary disease) (HCC)   . Hepatitis C   . Hypertension     Patient Active Problem List   Diagnosis Date Noted  . Alcohol abuse 10/22/2016  . 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 Cocaine abuse (HCC) 10/22/2016  . Cellulitis and abscess 10/06/2015  . Sepsis (HCC) 10/06/2015  . Anxiety 10/06/2015  . Asthma 10/06/2015  . Cellulitis     Past Surgical History:  Procedure Laterality Date  . ABDOMINAL HYSTERECTOMY    . FOOT SURGERY      Prior to Admission medications   Medication Sig Start Date End Date Taking? Authorizing Provider  baclofen (LIORESAL) 10 MG tablet Take 1 tablet by mouth 3 (three) times daily as needed. 10/13/16   [provider]  BELSOMRA 15 MG TABS Take 1 tablet by mouth at bedtime as needed.  09/16/15   [provider]  clonazePAM (KLONOPIN) 0.5 MG tablet Take 0.5 mg by mouth daily. 08/09/16   [provider]  DULoxetine (CYMBALTA) 30 MG capsule Take 30 mg by mouth daily.    [provider]  gabapentin  (NEURONTIN) 300 MG capsule Take 300 mg by mouth 4 (four) times daily. 300mg  with meals, 1200mg  at bedtime    [provider]  Ibuprofen-Diphenhydramine Cit (ADVIL PM PO) Take 1 tablet by mouth every evening.    [provider]  lisinopril (PRINIVIL,ZESTRIL) 10 MG tablet Take 1 tablet (10 mg total) daily by mouth. 05/09/17 05/09/18  Darci CurrentBrown, Keota N, MD  oxyCODONE-acetaminophen (PERCOCET/ROXICET) 5-325 MG tablet Take 1 tablet by mouth every 6 (six) hours as needed for moderate pain or severe pain. Patient not taking: Reported on 10/20/2016 10/09/15   Milagros LollSudini, Srikar, MD  QUEtiapine (SEROQUEL) 200 MG tablet Take 200 mg by mouth at bedtime. 100mg  3 times daily, 300mg  at bedtime    [provider]  SUBOXONE 8-2 MG FILM Place 1 Film under the tongue 2 (two) times daily. 10/08/16   [provider]    Allergies Patient has no known allergies.  Family History  Problem Relation Age of Onset  . Rheum arthritis Other   . Osteoporosis Other   . Clotting disorder Other   . Stroke Other     Social History Social History   Tobacco Use  . Smoking status: Current Every Day Smoker    Packs/day: 1.00    Types: Cigarettes  . Smokeless tobacco: Never Used  Substance Use Topics  . Alcohol use: Yes  . Drug use: No    Review of Systems Constitutional: No  fever/chills Eyes: No visual changes. ENT: No sore throat. Cardiovascular: Denies chest pain. Respiratory: Denies shortness of breath. Gastrointestinal: No abdominal pain.  No nausea, no vomiting.  No diarrhea.  No constipation. Genitourinary: Negative for dysuria. Musculoskeletal: Negative for neck pain.  Negative for back pain. Integumentary: Negative for rash. Neurological: Negative for headaches, focal weakness or numbness.   ____________________________________________   PHYSICAL EXAM:  VITAL SIGNS: ED Triage Vitals  Enc Vitals Group     BP 12/10/18 0553 (!) 197/119     Pulse Rate 12/10/18 0553 (!) 112      Resp 12/10/18 0553 (!) 24     Temp 12/10/18 0553 98.4 F (36.9 C)     Temp Source 12/10/18 0553 Oral     SpO2 12/10/18 0550 99 %     Weight 12/10/18 0554 51.3 kg (113 lb)     Height 12/10/18 0554 1.6 m (5\' 3" )     Head Circumference --      Peak Flow --      Pain Score 12/10/18 0554 10     Pain Loc --      Pain Edu? --      Excl. in GC? --     Constitutional: Alert and oriented. Well appearing and in no acute distress. Eyes: Conjunctivae are normal.  Head: Atraumatic. Ears:  Healthy appearing ear canals and TMs bilaterally Nose: No congestion/rhinnorhea. Mouth/Throat: Mucous membranes are moist. Oropharynx non-erythematous. Neck: No stridor.  Cardiovascular: Normal rate, regular rhythm. Good peripheral circulation. Grossly normal heart sounds. Respiratory: Normal respiratory effort.  No retractions. No audible wheezing. Gastrointestinal: Soft and nontender. No distention.  Musculoskeletal: No lower extremity tenderness nor edema. No gross deformities of extremities. Neurologic:  Normal speech and language. No gross focal neurologic deficits are appreciated.  Skin:  Skin is warm, dry and intact. No rash noted. Psychiatric: Mood and affect are normal. Speech and behavior are normal.  ____________________________________________   LABS (all labs ordered are listed, but only abnormal results are displayed)  Labs Reviewed  BASIC METABOLIC PANEL - Abnormal; Notable for the following components:      Result Value   CO2 21 (*)    Glucose, Bld 132 (*)    BUN 24 (*)    All other components within normal limits  CBC  TROPONIN I  URINE DRUG SCREEN, QUALITATIVE ([facility] ONLY)   ____________________________________________  EKG  ED ECG REPORT I, Henry N BROWN, the attending physician, personally viewed and interpreted this ECG.   Date: 12/10/2018  EKG Time: 5:57 AM  Rate: 107  Rhythm: Normal sinus rhythm  Axis: Normal  Intervals: Normal  ST&T Change: None  ________________________________  RADIOLOGY I, Oviedo N BROWN, personally viewed and evaluated these images (plain radiographs) as part of my medical decision making, as well as reviewing the written report by the radiologist.  ED MD interpretation: No acute cardiopulmonary disease noted on chest x-ray.  Official radiology report(s): Dg Chest Port 1 View  Result Date: 12/10/2018 CLINICAL DATA:  Chest pain and shortness of breath beginning 3.5 hours ago. EXAM: PORTABLE CHEST 1 VIEW COMPARISON:  Two-view chest x-ray 05/08/2017 FINDINGS: The heart size is normal. Atherosclerotic changes are noted at the aortic arch. There is no edema or effusion. No focal airspace disease is present. Chronic scarring is again noted at the right base. Changes of COPD are again noted. The visualized soft tissues and bony thorax are unremarkable. IMPRESSION: 1. No acute cardiopulmonary disease or significant interval change. Electronically Signed   By: Cristal Deerhristopher  Mattern M.D.   On: 12/10/2018 06:46     Procedures   ____________________________________________   INITIAL IMPRESSION / MDM / ASSESSMENT AND PLAN / ED COURSE  As part of my medical decision making, I reviewed the following data within the electronic MEDICAL RECORD NUMBER   58 year old female presented with above-stated history and physical exam secondary to chest pain noted to be markedly hypertensive.  Patient markedly anxious on my evaluation and as such 1 mg of IV Ativan was given nitropaste applied to the patient's chest with improvement of blood pressure.  EKG revealed no evidence of ischemia or infarction troponin negative x1.  Patient's care transferred to Dr. Cherylann Banas for continued blood pressure control repeat troponin  *Nicole [name] was evaluated in Emergency Department on 12/10/2018 for the symptoms described in the history of present illness. She was evaluated in the context of the global COVID-19 pandemic, which necessitated  consideration that the patient might be at risk for infection with the SARS-CoV-2 virus that causes COVID-19. Institutional protocols and algorithms that pertain to the evaluation of patients at risk for COVID-19 are in a state of rapid change based on information released by regulatory bodies including the CDC and federal and state organizations. These policies and algorithms were followed during the patient's care in the ED.  Some ED evaluations and interventions may be delayed as a result of limited staffing during the pandemic.*    ____________________________________________  FINAL CLINICAL IMPRESSION(S) / ED DIAGNOSES  Final diagnoses:  Hypertension, unspecified type  Tachycardia     MEDICATIONS GIVEN DURING THIS VISIT:  Medications  sodium chloride flush (NS) 0.9 % injection 3 mL (3 mLs Intravenous Given 12/10/18 0613)  nitroGLYCERIN (NITROGLYN) 2 % ointment 0.5 inch (0.5 inches Topical Given 12/10/18 0604)  LORazepam (ATIVAN) injection 1 mg (1 mg Intravenous Given 12/10/18 9924)     ED Discharge Orders    None       Note:  This document was prepared using Dragon voice recognition software and may include unintentional dictation errors.   Gregor Hams, MD 12/11/18 [phone]

## 2018-12-10 NOTE — H&P (Signed)
[place] [place] NAME: Nicole [name]    MR#:  [id number]  DATE OF BIRTH:  12/25/1960  DATE OF ADMISSION:  12/10/2018  PRIMARY CARE PHYSICIAN: [facility] [place]   REQUESTING/REFERRING PHYSICIAN: dr Cherylann Banas  CHIEF COMPLAINT:   I am feeling anxious can I have some Ativan HISTORY OF PRESENT ILLNESS:  Nicole [name]  is a 58 y.o. female with a known history of hypertension not on any medication, chronic anxiety not on medicine drinks alcohol on a regular basis although patient stays she is not comes to the emergency room with shaking, feeling anxious and shortness of breath with chest pain. Patient was found to be in sinus tach. Her blood pressure was elevated received IV labetalol and IV Ativan given her drinking alcohol. Her serum ethanol level is 198. Patient's EKG showed sinus tachycardia troponin times two is negative. She does not have any underlying cardiac history.  She is being admitted with malignant hypertension and alcohol withdrawal with anxiety  PAST MEDICAL HISTORY:   Past Medical History:  Diagnosis Date  . Anxiety   . Asthma   . Collagen vascular disease ([place])   . COPD (chronic obstructive pulmonary disease) ([place])   . Hepatitis C   . Hypertension     PAST SURGICAL HISTOIRY:   Past Surgical History:  Procedure Laterality Date  . ABDOMINAL HYSTERECTOMY    . FOOT SURGERY      SOCIAL HISTORY:   Social History   Tobacco Use  . Smoking status: Current Every Day Smoker    Packs/day: 1.00    Types: Cigarettes  . Smokeless tobacco: Never Used  Substance Use Topics  . Alcohol use: Yes    FAMILY HISTORY:   Family History  Problem Relation Age of Onset  . Rheum arthritis Other   . Osteoporosis Other   . Clotting disorder Other   . Stroke Other     DRUG ALLERGIES:  No Known Allergies  REVIEW OF SYSTEMS:  Review of Systems  Constitutional: Negative for chills, fever and  weight loss.  HENT: Negative for ear discharge, ear pain and nosebleeds.   Eyes: Negative for blurred vision, pain and discharge.  Respiratory: Positive for shortness of breath. Negative for sputum production, wheezing and stridor.   Cardiovascular: Positive for chest pain. Negative for palpitations, orthopnea and PND.  Gastrointestinal: Negative for abdominal pain, diarrhea, nausea and vomiting.  Genitourinary: Negative for frequency and urgency.  Musculoskeletal: Negative for back pain and joint pain.  Neurological: Positive for weakness. Negative for sensory change, speech change and focal weakness.  Psychiatric/Behavioral: Negative for depression and hallucinations. The patient is nervous/anxious.      MEDICATIONS AT HOME:   Prior to Admission medications   Not on File      VITAL SIGNS:  Blood pressure (!) 171/99, pulse 84, temperature 98.4 F (36.9 C), temperature source Oral, resp. rate (!) 22, height 5\' 3"  (1.6 m), weight 51.3 kg, SpO2 98 %.  PHYSICAL EXAMINATION:  GENERAL:  58 y.o.-year-old patient lying in the bed with mild acute distress.  EYES: Pupils equal, round, reactive to light and accommodation. No scleral icterus. Extraocular muscles intact.  HEENT: Head atraumatic, normocephalic. Oropharynx and nasopharynx clear.  NECK:  Supple, no jugular venous distention. No thyroid enlargement, no tenderness.  LUNGS: Normal breath sounds bilaterally, no wheezing, rales,rhonchi or crepitation. No use of accessory muscles of respiration.  CARDIOVASCULAR: S1, S2 normal. No murmurs, rubs, or gallops. tachycardia ++ ABDOMEN:  Soft, nontender, nondistended. Bowel sounds present. No organomegaly or mass.  EXTREMITIES: No pedal edema, cyanosis, or clubbing.  NEUROLOGIC: Cranial nerves II through XII are intact. Muscle strength 5/5 in all extremities. Sensation intact. Gait not checked.  PSYCHIATRIC: The patient is alert and oriented x 3. Anxious SKIN: No obvious rash, lesion, or  ulcer.   LABORATORY PANEL:   CBC Recent Labs  Lab 12/10/18 0603  WBC 6.1  HGB 14.7  HCT 41.8  PLT 256   ------------------------------------------------------------------------------------------------------------------  Chemistries  Recent Labs  Lab 12/10/18 0603  NA 140  K 3.5  CL 104  CO2 21*  GLUCOSE 132*  BUN 24*  CREATININE 0.85  CALCIUM 9.0   ------------------------------------------------------------------------------------------------------------------  Cardiac Enzymes Recent Labs  Lab 12/10/18 0953  TROPONINI <0.03   ------------------------------------------------------------------------------------------------------------------  RADIOLOGY:  Dg Chest Port 1 View  Result Date: 12/10/2018 CLINICAL DATA:  Chest pain and shortness of breath beginning 3.5 hours ago. EXAM: PORTABLE CHEST 1 VIEW COMPARISON:  Two-view chest x-ray 05/08/2017 FINDINGS: The heart size is normal. Atherosclerotic changes are noted at the aortic arch. There is no edema or effusion. No focal airspace disease is present. Chronic scarring is again noted at the right base. Changes of COPD are again noted. The visualized soft tissues and bony thorax are unremarkable. IMPRESSION: 1. No acute cardiopulmonary disease or significant interval change. Electronically Signed   By: Marin Robertshristopher  Mattern M.D.   On: 12/10/2018 06:46    EKG:  sinus tachycardia no acute ST elevation her depression  changes of LVH IMPRESSION AND PLAN:   Nicole [name]  is a 58 y.o. female with a known history of hypertension not on any medication, chronic anxiety not on medicine drinks alcohol on a regular basis although patient stays she is not comes to the emergency room with shaking, feeling anxious and shortness of breath with chest pain.  1. 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 Alcohol withdrawal with anxiety -continue CIWA protocol with oral Ativan -PO multivitamin -advised abstinence from alcohol  3. Sinus tachycardia due to number two -patient on metoprolol -heart rate much improved  4. Chest pain with shortness of breath suspected due to anxiety -troponin times two negative -will continue to monitor on telemetry -aspirin 81 mg daily -EKG shows sinus tach without any ST elevation her depression  5. DVT prophylaxis subcu Lovenox    All the records are reviewed and case discussed with ED provider.   CODE STATUS: Full  TOTAL TIME TAKING CARE OF THIS PATIENT: *50* minutes.    Nicole [name] M.D on 12/10/2018 at 2:59 PM  Between 7am to 6pm - Pager - [phone]  After 6pm go to [email or web address] - password [email or web address] [facility]  [facility]  Office  [phone]  CC: Primary care physician; [facility]

## 2018-12-10 NOTE — ED Notes (Addendum)
Pt requesting "ativan" for elevated BP and anxiety - HR in 120's and BP 187/121 Dr Cherylann Banas aware Pt appears anxious

## 2018-12-10 NOTE — ED Notes (Addendum)
Pt climbed out of bed and went to the bathroom without assistance and then started screaming for help - this pt has been ringing call bell every 10-15 minutes for trivial things so her orientation to the call bell is adequate - she was reiterated to use call bell for assistance to toilet - pt assisted back to bed and reconnected to monitors - pt is very unsteady on her feet with staggered gait and is extremely anxious - Dr Cherylann Banas notified

## 2018-12-10 NOTE — ED Notes (Signed)
Pt given a blanket

## 2018-12-10 NOTE — ED Notes (Signed)
Dr. Patel at bedside 

## 2018-12-10 NOTE — ED Notes (Signed)
Pt assisted back to bed from toilet

## 2018-12-10 NOTE — Progress Notes (Signed)
Pt received to room 257 from ED. Pt alert and oriented. Pt oriented to room. Pt reminded not to get out of bed without assistance. Pt verbalizes understanding. Bed alarm on. Call bell within reach.

## 2018-12-10 NOTE — Progress Notes (Signed)
Pt continues to complain of anxiety and is requesting more Ativan. Dr.Patel aware and orders received.

## 2018-12-10 NOTE — ED Notes (Signed)
Pt given crackers and peanut butter with coke 

## 2018-12-10 NOTE — ED Notes (Signed)
ED TO INPATIENT HANDOFF REPORT  ED Nurse Name and Phone #: Gabryelle Whitmoyer [phone]  S Name/Age/Gender Nicole [name] 58 y.o. female Room/Bed: ED05A/ED05A  Code Status   Code Status: Prior  Home/SNF/Other Home Patient oriented to: self, place, time and situation Is this baseline? Yes   Triage Complete: Triage complete  Chief Complaint [facility] - Chest pain/shortness of breath  Triage Note Pt arrived via EMS from home with reports of chest pain and shortness of breath that started about 3.5 hours prior to arrival. Pt has hx of HTN and COPD, but is non-adherent to medications. Pt did try to use albuterol inhaler with no relief. Pt has been off HTN meds for several months due to losing medicaid.  Pt reports chest pain is 10/10 and radiates to left arm and back.   Allergies No Known Allergies  Level of Care/Admitting Diagnosis ED Disposition    ED Disposition Condition Comment   Admit  [facility]ospital [facility] [[id number]]  Level of Care: Telemetry [5]  Covid Evaluation: N/A  Diagnosis: HTN (hypertension), malignant [161096][300546]  Admitting Physician: Joselyn GlassmanPATEL, SONA [[id number]]  Attending Physician: Enedina FinnerPATEL, SONA [[id number]]  PT Class (Do Not Modify): Observation [104]  PT Acc Code (Do Not Modify): Observation [10022]       B Medical/Surgery History Past Medical History:  Diagnosis Date  . Anxiety   . Asthma   . Collagen vascular disease (HCC)   . COPD (chronic obstructive pulmonary disease) (HCC)   . Hepatitis C   . Hypertension    Past Surgical History:  Procedure Laterality Date  . ABDOMINAL HYSTERECTOMY    . FOOT SURGERY       A IV Location/Drains/Wounds Patient Lines/Drains/Airways Status   Active Line/Drains/Airways    Name:   Placement date:   Placement time:   Site:   Days:   Peripheral IV 12/10/18 Left Forearm   12/10/18    0606    Forearm   less than 1   Wound / Incision (Open or Dehisced) 10/05/15 Puncture Leg Left Wound bed is black in color with  purulent drainge noted. Redness incircling wound. Tender.    10/05/15    2158    Leg   1162          Intake/Output Last 24 hours  Intake/Output Summary (Last 24 hours) at 12/10/2018 1441 Last data filed at 12/10/2018 1124 Gross per 24 hour  Intake 1000 ml  Output -  Net 1000 ml    Labs/Imaging Results for orders placed or performed during the hospital encounter of 12/10/18 (from the past 48 hour(s))  Basic metabolic panel     Status: Abnormal   Collection Time: 12/10/18  6:03 AM  Result Value Ref Range   Sodium 140 135 - 145 mmol/L   Potassium 3.5 3.5 - 5.1 mmol/L   Chloride 104 98 - 111 mmol/L   CO2 21 (L) 22 - 32 mmol/L   Glucose, Bld 132 (H) 70 - 99 mg/dL   BUN 24 (H) 6 - 20 mg/dL   Creatinine, Ser 0.450.85 0.44 - 1.00 mg/dL   Calcium 9.0 8.9 - 40.910.3 mg/dL   GFR calc non Af Amer >60 >60 mL/min   GFR calc Af Amer >60 >60 mL/min   Anion gap 15 5 - 15    Comment: Performed at [facility], [address] [place], [place] [address]  CBC     Status: None   Collection Time: 12/10/18  6:03 AM  Result Value Ref  Range   WBC 6.1 4.0 - 10.5 K/uL   RBC 4.43 3.87 - 5.11 MIL/uL   Hemoglobin 14.7 12.0 - 15.0 g/dL   HCT 41.8 36.0 - 46.0 %   MCV 94.4 80.0 - 100.0 fL   MCH 33.2 26.0 - 34.0 pg   MCHC 35.2 30.0 - 36.0 g/dL   RDW 13.0 11.5 - 15.5 %   Platelets 256 150 - 400 K/uL   nRBC 0.0 0.0 - 0.2 %    Comment: Performed at [facility], [address] [place], [place] [address]  Troponin I - ONCE - STAT     Status: None   Collection Time: 12/10/18  6:03 AM  Result Value Ref Range   Troponin I <0.03 <0.03 ng/mL    Comment: Performed at [facility], [address] [place], [place] [address]  Ethanol     Status: Abnormal   Collection Time: 12/10/18  6:03 AM  Result Value Ref Range   Alcohol, Ethyl (B) 198 (H) <10 mg/dL    Comment: (NOTE) Lowest detectable limit for serum alcohol is 10 mg/dL. For medical purposes only. Performed at [facility], [address] [place], [place] [address]   Urine Drug Screen, Qualitative [facility] only)     Status: None   Collection Time: 12/10/18  6:19 AM  Result Value Ref Range   Tricyclic, Ur Screen NONE DETECTED NONE DETECTED   Amphetamines, Ur Screen NONE DETECTED NONE DETECTED   MDMA (Ecstasy)Ur Screen NONE DETECTED NONE DETECTED   Cocaine Metabolite,Ur [place] NONE DETECTED NONE DETECTED   Opiate, Ur Screen NONE DETECTED NONE DETECTED   Phencyclidine (PCP) Ur S NONE DETECTED NONE DETECTED   Cannabinoid 50 Ng, Ur [place] NONE DETECTED NONE DETECTED   Barbiturates, Ur Screen NONE DETECTED NONE DETECTED   Benzodiazepine, Ur Scrn NONE DETECTED NONE DETECTED   Methadone Scn, Ur NONE DETECTED NONE DETECTED    Comment: (NOTE) Tricyclics + metabolites, urine    Cutoff 1000 ng/mL Amphetamines + metabolites, urine  Cutoff 1000 ng/mL MDMA (Ecstasy), urine              Cutoff 500 ng/mL Cocaine Metabolite, urine          Cutoff 300 ng/mL Opiate + metabolites, urine        Cutoff 300 ng/mL Phencyclidine (PCP), urine         Cutoff 25 ng/mL Cannabinoid, urine                 Cutoff 50 ng/mL Barbiturates + metabolites, urine  Cutoff 200 ng/mL Benzodiazepine, urine              Cutoff 200 ng/mL Methadone, urine                   Cutoff 300 ng/mL The urine drug screen provides only a preliminary, unconfirmed analytical test result and should not be used for non-medical purposes. Clinical consideration and professional judgment should be applied to any positive drug screen result due to possible interfering substances. A more specific alternate chemical method must be used in order to obtain a confirmed analytical result. Gas chromatography / mass spectrometry (GC/MS) is the preferred confirmat ory method. Performed at [facility], [address] [place], [place] [address]   Troponin I - Once     Status: None   Collection Time: 12/10/18  9:53 AM  Result Value Ref Range   Troponin I <0.03  <0.03 ng/mL    Comment: Performed  at [facility], [address] [place], [place] [address]   Dg Chest Port 1 View  Result Date: 12/10/2018 CLINICAL DATA:  Chest pain and shortness of breath beginning 3.5 hours ago. EXAM: PORTABLE CHEST 1 VIEW COMPARISON:  Two-view chest x-ray 05/08/2017 FINDINGS: The heart size is normal. Atherosclerotic changes are noted at the aortic arch. There is no edema or effusion. No focal airspace disease is present. Chronic scarring is again noted at the right base. Changes of COPD are again noted. The visualized soft tissues and bony thorax are unremarkable. IMPRESSION: 1. No acute cardiopulmonary disease or significant interval change. Electronically Signed   By: Marin Robertshristopher  Mattern M.D.   On: 12/10/2018 06:46    Pending Labs Unresulted Labs (From admission, onward)    Start     Ordered   12/10/18 1139  Novel Coronavirus,NAA,(SEND-OUT TO REF LAB - TAT 24-48 hrs); Hosp Order  (Asymptomatic Patients Labs)  Once,   STAT    Question:  Rule Out  Answer:  Yes   12/10/18 1138   Signed and Held  CBC  (enoxaparin (LOVENOX)    CrCl >/= 30 ml/min)  Once,   R    Comments:  Baseline for enoxaparin therapy IF NOT ALREADY DRAWN.  Notify MD if PLT < 100 K.    Signed and Held   Signed and Held  Creatinine, serum  (enoxaparin (LOVENOX)    CrCl >/= 30 ml/min)  Once,   R    Comments:  Baseline for enoxaparin therapy IF NOT ALREADY DRAWN.    Signed and Held   Signed and Held  Creatinine, serum  (enoxaparin (LOVENOX)    CrCl >/= 30 ml/min)  Weekly,   R    Comments:  while on enoxaparin therapy    Signed and Held   Signed and Held  Troponin I - Now Then Q6H  Now then every 6 hours,   R     Signed and Held          Vitals/Pain Today's Vitals   12/10/18 1200 12/10/18 1230 12/10/18 1300 12/10/18 1330  BP: (!) 171/105 (!) 149/93 (!) 170/100 (!) 171/99  Pulse:  85 79 84  Resp:      Temp:      TempSrc:      SpO2:  100% 100% 98%  Weight:      Height:       PainSc:        Isolation Precautions No active isolations  Medications Medications  metoprolol tartrate (LOPRESSOR) tablet 25 mg (25 mg Oral Given 12/10/18 1208)  LORazepam (ATIVAN) tablet 1 mg (1 mg Oral Given 12/10/18 1208)  thiamine (VITAMIN B-1) tablet 100 mg (100 mg Oral Given 12/10/18 1346)  folic acid (FOLVITE) tablet 1 mg (1 mg Oral Given 12/10/18 1349)  multivitamin with minerals tablet 1 tablet (1 tablet Oral Given 12/10/18 1349)  sodium chloride flush (NS) 0.9 % injection 3 mL (3 mLs Intravenous Given 12/10/18 0613)  nitroGLYCERIN (NITROGLYN) 2 % ointment 0.5 inch (0.5 inches Topical Given 12/10/18 0604)  LORazepam (ATIVAN) injection 1 mg (1 mg Intravenous Given 12/10/18 0611)  LORazepam (ATIVAN) injection 1 mg (1 mg Intravenous Given 12/10/18 0820)  labetalol (NORMODYNE) injection 10 mg (10 mg Intravenous Given 12/10/18 1011)  LORazepam (ATIVAN) injection 1 mg (1 mg Intravenous Given 12/10/18 1009)  sodium chloride 0.9 % bolus 1,000 mL (0 mLs Intravenous Stopped 12/10/18 1124)    Mobility walks with person assist Low fall risk   Focused Assessments Cardiac  Assessment Handoff:  Cardiac Rhythm: Sinus tachycardia Lab Results  Component Value Date   TROPONINI <0.03 12/10/2018   No results found for: DDIMER Does the Patient currently have chest pain? No     R Recommendations: See Admitting Provider Note  Report given to:   Additional Notes:

## 2018-12-10 NOTE — ED Notes (Signed)
ED TO INPATIENT HANDOFF REPORT  ED Nurse Name and Phone #: liz [phone]  S Name/Age/Gender Nicole [name] 58 y.o. female Room/Bed: ED05A/ED05A  Code Status   Code Status: Full Code  Home/SNF/Other Home Patient oriented to: situation Is this baseline? Yes   Triage Complete: Triage complete  Chief Complaint [facility] - Chest pain/shortness of breath  Triage Note Pt arrived via EMS from home with reports of chest pain and shortness of breath that started about 3.5 hours prior to arrival. Pt has hx of HTN and COPD, but is non-adherent to medications. Pt did try to use albuterol inhaler with no relief. Pt has been off HTN meds for several months due to losing medicaid.  Pt reports chest pain is 10/10 and radiates to left arm and back.   Allergies No Known Allergies  Level of Care/Admitting Diagnosis ED Disposition    ED Disposition Condition Comment   Admit  [facility] [[id number]]  Level of Care: Telemetry [5]  Covid Evaluation: N/A  Diagnosis: HTN (hypertension), malignant [161096][300546]  Admitting Physician: Joselyn GlassmanPATEL, SONA [[id number]]  Attending Physician: Enedina FinnerPATEL, SONA [[id number]]  PT Class (Do Not Modify): Observation [104]  PT Acc Code (Do Not Modify): Observation [10022]       B Medical/Surgery History Past Medical History:  Diagnosis Date  . Anxiety   . Asthma   . Collagen vascular disease (HCC)   . COPD (chronic obstructive pulmonary disease) (HCC)   . Hepatitis C   . Hypertension    Past Surgical History:  Procedure Laterality Date  . ABDOMINAL HYSTERECTOMY    . FOOT SURGERY       A IV Location/Drains/Wounds Patient Lines/Drains/Airways Status   Active Line/Drains/Airways    Name:   Placement date:   Placement time:   Site:   Days:   Peripheral IV 12/10/18 Left Forearm   12/10/18    0606    Forearm   less than 1   Wound / Incision (Open or Dehisced) 10/05/15 Puncture Leg Left Wound bed is black in color with purulent drainge noted.  Redness incircling wound. Tender.    10/05/15    2158    Leg   1162          Intake/Output Last 24 hours  Intake/Output Summary (Last 24 hours) at 12/10/2018 1518 Last data filed at 12/10/2018 1124 Gross per 24 hour  Intake 1000 ml  Output -  Net 1000 ml    Labs/Imaging Results for orders placed or performed during the hospital encounter of 12/10/18 (from the past 48 hour(s))  Basic metabolic panel     Status: Abnormal   Collection Time: 12/10/18  6:03 AM  Result Value Ref Range   Sodium 140 135 - 145 mmol/L   Potassium 3.5 3.5 - 5.1 mmol/L   Chloride 104 98 - 111 mmol/L   CO2 21 (L) 22 - 32 mmol/L   Glucose, Bld 132 (H) 70 - 99 mg/dL   BUN 24 (H) 6 - 20 mg/dL   Creatinine, Ser 0.450.85 0.44 - 1.00 mg/dL   Calcium 9.0 8.9 - 40.910.3 mg/dL   GFR calc non Af Amer >60 >60 mL/min   GFR calc Af Amer >60 >60 mL/min   Anion gap 15 5 - 15    Comment: Performed at [facility], [address] [place], [place] [address]  CBC     Status: None   Collection Time: 12/10/18  6:03 AM  Result Value Ref Range  WBC 6.1 4.0 - 10.5 K/uL   RBC 4.43 3.87 - 5.11 MIL/uL   Hemoglobin 14.7 12.0 - 15.0 g/dL   HCT 09.841.8 11.936.0 - 14.746.0 %   MCV 94.4 80.0 - 100.0 fL   MCH 33.2 26.0 - 34.0 pg   MCHC 35.2 30.0 - 36.0 g/dL   RDW 82.913.0 56.211.5 - 13.015.5 %   Platelets 256 150 - 400 K/uL   nRBC 0.0 0.0 - 0.2 %    Comment: Performed at [facility], [address] [place], [place] [address]  Troponin I - ONCE - STAT     Status: None   Collection Time: 12/10/18  6:03 AM  Result Value Ref Range   Troponin I <0.03 <0.03 ng/mL    Comment: Performed at [facility], [address] [place], [place] [address]  Ethanol     Status: Abnormal   Collection Time: 12/10/18  6:03 AM  Result Value Ref Range   Alcohol, Ethyl (B) 198 (H) <10 mg/dL    Comment: (NOTE) Lowest detectable limit for serum alcohol is 10 mg/dL. For medical purposes only. Performed at [facility], [address] [place], [place] [address]   Urine Drug Screen, Qualitative [facility] only)     Status: None   Collection Time: 12/10/18  6:19 AM  Result Value Ref Range   Tricyclic, Ur Screen NONE DETECTED NONE DETECTED   Amphetamines, Ur Screen NONE DETECTED NONE DETECTED   MDMA (Ecstasy)Ur Screen NONE DETECTED NONE DETECTED   Cocaine Metabolite,Ur [place] NONE DETECTED NONE DETECTED   Opiate, Ur Screen NONE DETECTED NONE DETECTED   Phencyclidine (PCP) Ur S NONE DETECTED NONE DETECTED   Cannabinoid 50 Ng, Ur [place] NONE DETECTED NONE DETECTED   Barbiturates, Ur Screen NONE DETECTED NONE DETECTED   Benzodiazepine, Ur Scrn NONE DETECTED NONE DETECTED   Methadone Scn, Ur NONE DETECTED NONE DETECTED    Comment: (NOTE) Tricyclics + metabolites, urine    Cutoff 1000 ng/mL Amphetamines + metabolites, urine  Cutoff 1000 ng/mL MDMA (Ecstasy), urine              Cutoff 500 ng/mL Cocaine Metabolite, urine          Cutoff 300 ng/mL Opiate + metabolites, urine        Cutoff 300 ng/mL Phencyclidine (PCP), urine         Cutoff 25 ng/mL Cannabinoid, urine                 Cutoff 50 ng/mL Barbiturates + metabolites, urine  Cutoff 200 ng/mL Benzodiazepine, urine              Cutoff 200 ng/mL Methadone, urine                   Cutoff 300 ng/mL The urine drug screen provides only a preliminary, unconfirmed analytical test result and should not be used for non-medical purposes. Clinical consideration and professional judgment should be applied to any positive drug screen result due to possible interfering substances. A more specific alternate chemical method must be used in order to obtain a confirmed analytical result. Gas chromatography / mass spectrometry (GC/MS) is the preferred confirmat ory method. Performed at [facility], [address] [place], [place] [address]   Troponin I - Once     Status: None   Collection Time: 12/10/18  9:53 AM  Result Value Ref Range   Troponin I <0.03 <0.03 ng/mL    Comment:  Performed at [facility], [address] [place], [place] [address]   Dg Chest Port 1 View  Result Date: 12/10/2018 CLINICAL DATA:  Chest pain and shortness of breath beginning 3.5 hours ago. EXAM: PORTABLE CHEST 1 VIEW COMPARISON:  Two-view chest x-ray 05/08/2017 FINDINGS: The heart size is normal. Atherosclerotic changes are noted at the aortic arch. There is no edema or effusion. No focal airspace disease is present. Chronic scarring is again noted at the right base. Changes of COPD are again noted. The visualized soft tissues and bony thorax are unremarkable. IMPRESSION: 1. No acute cardiopulmonary disease or significant interval change. Electronically Signed   By: Marin Robertshristopher  Mattern M.D.   On: 12/10/2018 06:46    Pending Labs Unresulted Labs (From admission, onward)    Start     Ordered   12/17/18 0500  Creatinine, serum  (enoxaparin (LOVENOX)    CrCl >/= 30 ml/min)  Weekly,   STAT    Comments:  while on enoxaparin therapy    12/10/18 1453   12/10/18 1454  CBC  (enoxaparin (LOVENOX)    CrCl >/= 30 ml/min)  Once,   STAT    Comments:  Baseline for enoxaparin therapy IF NOT ALREADY DRAWN.  Notify MD if PLT < 100 K.    12/10/18 1453   12/10/18 1454  Creatinine, serum  (enoxaparin (LOVENOX)    CrCl >/= 30 ml/min)  Once,   STAT    Comments:  Baseline for enoxaparin therapy IF NOT ALREADY DRAWN.    12/10/18 1453   12/10/18 1454  Troponin I - Now Then Q6H  Now then every 6 hours,   STAT     12/10/18 1453   12/10/18 1139  Novel Coronavirus,NAA,(SEND-OUT TO REF LAB - TAT 24-48 hrs); Hosp Order  (Asymptomatic Patients Labs)  Once,   STAT    Question:  Rule Out  Answer:  Yes   12/10/18 1138          Vitals/Pain Today's Vitals   12/10/18 1200 12/10/18 1230 12/10/18 1300 12/10/18 1330  BP: (!) 171/105 (!) 149/93 (!) 170/100 (!) 171/99  Pulse:  85 79 84  Resp:      Temp:      TempSrc:      SpO2:  100% 100% 98%  Weight:      Height:      PainSc:        Isolation  Precautions No active isolations  Medications Medications  enoxaparin (LOVENOX) injection 40 mg (has no administration in time range)  0.9 %  sodium chloride infusion (has no administration in time range)  acetaminophen (TYLENOL) tablet 650 mg (has no administration in time range)    Or  acetaminophen (TYLENOL) suppository 650 mg (has no administration in time range)  polyethylene glycol (MIRALAX / GLYCOLAX) packet 17 g (has no administration in time range)  ondansetron (ZOFRAN) tablet 4 mg (has no administration in time range)    Or  ondansetron (ZOFRAN) injection 4 mg (has no administration in time range)  aspirin EC tablet 81 mg (has no administration in time range)  multivitamin with minerals tablet 1 tablet (has no administration in time range)  albuterol (PROVENTIL) (2.5 MG/3ML) 0.083% nebulizer solution 2.5 mg (has no administration in time range)  hydrALAZINE (APRESOLINE) injection 10 mg (has no administration in time range)  metoprolol tartrate (LOPRESSOR) tablet 25 mg (25 mg Oral Given 12/10/18 1208)  LORazepam (ATIVAN) tablet 1 mg (1 mg Oral Given 12/10/18 1208)  thiamine (VITAMIN B-1) tablet 100 mg (100 mg  Oral Given 7/90/24 0973)  folic acid (FOLVITE) tablet 1 mg (1 mg Oral Given 12/10/18 1349)  multivitamin with minerals tablet 1 tablet (1 tablet Oral Given 12/10/18 1349)  sodium chloride flush (NS) 0.9 % injection 3 mL (3 mLs Intravenous Given 12/10/18 0613)  nitroGLYCERIN (NITROGLYN) 2 % ointment 0.5 inch (0.5 inches Topical Given 12/10/18 0604)  LORazepam (ATIVAN) injection 1 mg (1 mg Intravenous Given 12/10/18 0611)  LORazepam (ATIVAN) injection 1 mg (1 mg Intravenous Given 12/10/18 0820)  labetalol (NORMODYNE) injection 10 mg (10 mg Intravenous Given 12/10/18 1011)  LORazepam (ATIVAN) injection 1 mg (1 mg Intravenous Given 12/10/18 1009)  sodium chloride 0.9 % bolus 1,000 mL (0 mLs Intravenous Stopped 12/10/18 1124)    Mobility walks Low fall risk   Focused  Assessments Cardiac Assessment Handoff:  Cardiac Rhythm: Sinus tachycardia Lab Results  Component Value Date   TROPONINI <0.03 12/10/2018   No results found for: DDIMER Does the Patient currently have chest pain? No     R Recommendations: See Admitting Provider Note  Report given to: xx  Additional Notes:   Came in very early am for chest pain and htn.  Has had ativan and labetolol.  She had been drinking etoh last night and has been in rehab before.  Her second troponin was unchanged--normal.  She is a fall risk--although oriented to call light she got up without calling.--unsteady on feet.

## 2018-12-10 NOTE — ED Notes (Signed)
COVID-19 swab handed off by this tech to laboratory personnel Angela.  

## 2018-12-10 NOTE — ED Notes (Signed)
Pt given applesauce and informed pt that her husband can bring her charger to the front desk

## 2018-12-10 NOTE — ED Notes (Signed)
Pt assisted back in bed from bathroom

## 2018-12-10 NOTE — ED Provider Notes (Signed)
-----------------------------------------   11:38 AM on 12/10/2018 -----------------------------------------  I took over care of this patient from Dr. Owens Shark.  The patient presented with anxiety, shortness of breath, and chest pain.  She had an elevated blood pressure initially which responded to nitroglycerin paste.  Over the last several hours the patient has had recurrent blood pressure as high as the 180s over 120s and recurrent tachycardia to the 110s.  Her symptoms respond to Ativan and I also gave a dose of labetalol.  The patient is fully alert and oriented, however she does have tremor, is unsteady on her feet, and demonstrates tongue fasciculation.  I am concerned for alcohol withdrawal.  The patient claims that she had a wine cooler last night although she states she does drink fairly frequently.  She states she has been in rehab before although she is not aware if she has ever had withdrawal.  She reports that her blood pressure does go up when she is not drinking.  Given the recurrent elevated blood pressure and tachycardia, the strong possibility of active alcohol withdrawal, and the fact that the patient is having difficulty ambulating, I will admit her for further management.  The patient agrees with this plan.  I signed her out to the hospitalist Dr. Darvin Neighbours.   Arta Silence, MD 12/10/18 1140

## 2018-12-10 NOTE — ED Notes (Signed)
Pt given a coke and assisted to bathroom- oriented to pull the call bell cord when finished to assist her back to bed

## 2018-12-10 NOTE — ED Triage Notes (Signed)
Pt arrived via EMS from home with reports of chest pain and shortness of breath that started about 3.5 hours prior to arrival. Pt has hx of HTN and COPD, but is non-adherent to medications. Pt did try to use albuterol inhaler with no relief. Pt has been off HTN meds for several months due to losing medicaid.  Pt reports chest pain is 10/10 and radiates to left arm and back.

## 2018-12-11 ENCOUNTER — Observation Stay
Admit: 2018-12-11 | Discharge: 2018-12-11 | Disposition: A | Discharge status: Home / Self Care | Payer: Self-pay | Attending: Internal Medicine | Admitting: Internal Medicine

## 2018-12-11 LAB — NOVEL CORONAVIRUS, NAA (HOSP ORDER, SEND-OUT TO REF LAB; TAT 18-24 HRS): SARS-CoV-2, NAA: NOT DETECTED

## 2018-12-11 LAB — TROPONIN I: Troponin I: <0.03 ng/mL (ref <0.03)

## 2018-12-11 MED ORDER — METOPROLOL TARTRATE 25 MG PO TABS
25.0000 mg | ORAL_TABLET | Freq: Three times a day (TID) | ORAL | Status: DC
  Administered 2018-12-11: 25 mg via ORAL
  Filled 2018-12-11: qty 1

## 2018-12-11 MED ORDER — HYDRALAZINE HCL 20 MG/ML IJ SOLN
10.0000 mg | Freq: Four times a day (QID) | INTRAMUSCULAR | Status: DC | PRN
  Administered 2018-12-11: 10 mg via INTRAVENOUS
  Filled 2018-12-11: qty 1

## 2018-12-11 MED ORDER — CLONAZEPAM 0.1 MG/ML ORAL SUSPENSION: 0.2500 mg | Freq: Two times a day (BID) | ORAL | Status: DC

## 2018-12-11 MED ORDER — CLONAZEPAM 0.5 MG PO TABS
0.2500 mg | ORAL_TABLET | Freq: Two times a day (BID) | ORAL | Status: DC
  Administered 2018-12-11 – 2018-12-12 (×2): 0.25 mg via ORAL
  Filled 2018-12-11 (×2): qty 1

## 2018-12-11 MED ORDER — LISINOPRIL 10 MG PO TABS
10.0000 mg | ORAL_TABLET | Freq: Every day | ORAL | Status: DC
  Administered 2018-12-11 – 2018-12-12 (×2): 10 mg via ORAL
  Filled 2018-12-11 (×2): qty 1

## 2018-12-11 MED ORDER — NICOTINE 21 MG/24HR TD PT24
21.0000 mg | MEDICATED_PATCH | Freq: Every day | TRANSDERMAL | Status: DC
  Administered 2018-12-11 – 2018-12-12 (×2): 21 mg via TRANSDERMAL
  Filled 2018-12-11 (×2): qty 1

## 2018-12-11 MED ORDER — CARVEDILOL 12.5 MG PO TABS
12.5000 mg | ORAL_TABLET | Freq: Two times a day (BID) | ORAL | Status: DC
  Administered 2018-12-11 – 2018-12-12 (×2): 12.5 mg via ORAL
  Filled 2018-12-11 (×2): qty 1

## 2018-12-11 NOTE — Progress Notes (Signed)
Phone consultation with Dr. Posey Pronto.  Ensure patient is on benzodiazepine taper to prevent alcohol withdrawal, continue on CIWA protocol, restart Klonopin twice daily at home dose. Please place psychiatry consult if hospitalist service would like psychiatry to follow.  Lavella Hammock, MD

## 2018-12-11 NOTE — Progress Notes (Signed)
[facility] NAME: Nicole [name]    MR#:  [id number]  DATE OF BIRTH:  04-Feb-1961  SUBJECTIVE:   Anxious I cannot sleep-- I have insomnia for a long time REVIEW OF SYSTEMS:   Review of Systems  Constitutional: Negative for chills, fever and weight loss.  HENT: Negative for ear discharge, ear pain and nosebleeds.   Eyes: Negative for blurred vision, pain and discharge.  Respiratory: Negative for sputum production, shortness of breath, wheezing and stridor.   Cardiovascular: Negative for chest pain, palpitations, orthopnea and PND.  Gastrointestinal: Negative for abdominal pain, diarrhea, nausea and vomiting.  Genitourinary: Negative for frequency and urgency.  Musculoskeletal: Negative for back pain and joint pain.  Neurological: Negative for sensory change, speech change, focal weakness and weakness.  Psychiatric/Behavioral: Negative for depression and hallucinations. The patient is not nervous/anxious.    DRUG ALLERGIES:  No Known Allergies  VITALS:  Blood pressure (!) 152/125, pulse (!) 120, temperature 98.5 F (36.9 C), temperature source Oral, resp. rate 18, height 5\' 3"  (1.6 m), weight 50.4 kg, SpO2 100 %.  PHYSICAL EXAMINATION:   Physical Exam  GENERAL:  58 y.o.-year-old patient lying in the bed with no acute distress.  EYES: Pupils equal, round, reactive to light and accommodation. No scleral icterus. Extraocular muscles intact.  HEENT: Head atraumatic, normocephalic. Oropharynx and nasopharynx clear.  NECK:  Supple, no jugular venous distention. No thyroid enlargement, no tenderness.  LUNGS: Normal breath sounds bilaterally, no wheezing, rales, rhonchi. No use of accessory muscles of respiration.  CARDIOVASCULAR: S1, S2 normal. No murmurs, rubs, or gallops.  ABDOMEN: Soft, nontender, nondistended. Bowel sounds present. No organomegaly or mass.  EXTREMITIES: No cyanosis, clubbing or edema b/l.    NEUROLOGIC:  Cranial nerves II through XII are intact. No focal Motor or sensory deficits b/l.   PSYCHIATRIC:  patient is alert and oriented x 3. Anxious+ SKIN: No obvious rash, lesion, or ulcer.   LABORATORY PANEL:  CBC Recent Labs  Lab 12/10/18 0603  WBC 6.1  HGB 14.7  HCT 41.8  PLT 256    Chemistries  Recent Labs  Lab 12/10/18 0603  NA 140  K 3.5  CL 104  CO2 21*  GLUCOSE 132*  BUN 24*  CREATININE 0.85  CALCIUM 9.0   Cardiac Enzymes Recent Labs  Lab 12/11/18 0238  TROPONINI <0.03   RADIOLOGY:  Dg Chest Port 1 View  Result Date: 12/10/2018 CLINICAL DATA:  Chest pain and shortness of breath beginning 3.5 hours ago. EXAM: PORTABLE CHEST 1 VIEW COMPARISON:  Two-view chest x-ray 05/08/2017 FINDINGS: The heart size is normal. Atherosclerotic changes are noted at the aortic arch. There is no edema or effusion. No focal airspace disease is present. Chronic scarring is again noted at the right base. Changes of COPD are again noted. The visualized soft tissues and bony thorax are unremarkable. IMPRESSION: 1. No acute cardiopulmonary disease or significant interval change. Electronically Signed   By: San Morelle M.D.   On: 12/10/2018 06:46   ASSESSMENT AND PLAN:   Nicole [name]  is a 58 y.o. female with a known history of hypertension not on any medication, chronic anxiety not on medicine drinks alcohol on a regular basis although patient stays she is not comes to the emergency room with shaking, feeling anxious and shortness of breath with chest pain.  1. 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 Alcohol withdrawal with anxiety -continue CIWA protocol with oral Ativan -PO multivitamin -advised abstinence from alcohol -psychiatry consulted -pt used to see Dr Janeece RiggersSu   3. Sinus tachycardia   -patient on metoprolol -heart rate much improved  4. Chest pain with shortness of breath suspected due to anxiety -troponin x 4 negative -aspirin 81 mg daily -EKG shows sinus tach without any ST elevation her depression  5. DVT prophylaxis subcu Lovenox  Case discussed with Care Management/Social Worker.  CODE STATUS: full  DVT Prophylaxis: lovenox  TOTAL TIME TAKING CARE OF THIS PATIENT: 30 minutes.  >50% time spent on counselling and coordination of care  POSSIBLE D/C IN *1-2* DAYS, DEPENDING ON CLINICAL CONDITION.  Note: This dictation was prepared with Dragon dictation along with smaller phrase technology. Any transcriptional errors that result from this process are unintentional.  Enedina FinnerSona Burt Piatek M.D on 12/11/2018 at 11:34 AM  Between 7am to 6pm - Pager - [phone]  After 6pm go to [email or web address] - Social research officer, governmentpassword EPAS [facility]  [facility]  Office  [phone]  CC: Primary care physician; [facility] ID: Nicole OlszewskiKimberly Dawn [name], female   DOB: February 13, 1961, 58 y.o.   MRN: [id number]

## 2018-12-11 NOTE — Progress Notes (Signed)
Talked to Dr. Posey Pronto and order given for psych consult. Also let MD know about patient's BP of 158/122, order to start Lisinopril 10 mg daily to be start tonight. RN will continue to monitor.

## 2018-12-11 NOTE — Progress Notes (Signed)
Patient ID: Nicole [name], female   DOB: 17-Sep-1960, 58 y.o.   MRN: [id number] Dr Leverne Humbles to see pt.  Ok to add klonopin bid

## 2018-12-11 NOTE — Progress Notes (Signed)
*  PRELIMINARY RESULTS* Echocardiogram 2D Echocardiogram has been performed.  Nicole [name] 12/11/2018, 11:28 AM

## 2018-12-12 DIAGNOSIS — F1010 Alcohol abuse, uncomplicated: Secondary | ICD-10-CM

## 2018-12-12 MED ORDER — CLONAZEPAM 0.5 MG PO TABS: 0.2500 mg | ORAL_TABLET | Freq: Two times a day (BID) | ORAL | 0 refills | Status: DC

## 2018-12-12 MED ORDER — ADULT MULTIVITAMIN W/MINERALS CH: 1.0000 | ORAL_TABLET | Freq: Every day | ORAL | 0 refills | Status: DC

## 2018-12-12 MED ORDER — AMITRIPTYLINE HCL 25 MG PO TABS: 25.0000 mg | ORAL_TABLET | Freq: Every evening | ORAL | 0 refills | Status: DC | PRN

## 2018-12-12 MED ORDER — LISINOPRIL 10 MG PO TABS: 10.0000 mg | ORAL_TABLET | Freq: Every day | ORAL | 1 refills | Status: DC

## 2018-12-12 MED ORDER — ASPIRIN 81 MG PO TBEC: 81.0000 mg | DELAYED_RELEASE_TABLET | Freq: Every day | ORAL | 0 refills | Status: DC

## 2018-12-12 MED ORDER — CARVEDILOL 12.5 MG PO TABS: 12.5000 mg | ORAL_TABLET | Freq: Two times a day (BID) | ORAL | 1 refills | Status: DC

## 2018-12-12 MED ORDER — SODIUM CHLORIDE 0.9% FLUSH
3.0000 mL | Freq: Two times a day (BID) | INTRAVENOUS | Status: DC
  Administered 2018-12-12: 3 mL via INTRAVENOUS

## 2018-12-12 MED ORDER — SODIUM CHLORIDE 0.9% FLUSH: 3.0000 mL | INTRAVENOUS | Status: DC | PRN

## 2018-12-12 NOTE — TOC Transition Note (Signed)
Transition of Care [facility]) - CM/SW Discharge Note   Patient Details  Name: Nicole [name] MRN: [id number] Date of Birth: [birth year]  Transition of Care [facility]) CM/SW Contact:  Ross Ludwig, LCSW Phone Number: 12/12/2018, 6:08 PM   Clinical Narrative:     Case manager provided substance abuse resources, information [facility] how to get [facility], and also informed her that the medications are on the $4 list at [facility].  Patient discharging back home.   Final next level of care: Home/Self Care Barriers to Discharge: No Barriers Identified   Patient Goals and CMS Choice Patient states their goals for this hospitalization and ongoing recovery are:: To return back home      Discharge Placement                       Discharge Plan and Services                DME Arranged: N/A DME Agency: NA       HH Arranged: NA          Social Determinants of Health (SDOH) Interventions     Readmission Risk Interventions No flowsheet data found.

## 2018-12-12 NOTE — Consult Note (Signed)
[facility]   Reason for Consult:  Alcohol abuse and anxiety Referring Physician:  Dr Allena KatzPatel Patient Identification: Nicole ComberKimberly Dawn [name] MRN:  [id number] Principal Diagnosis: chest pain Diagnosis:  Active Problems:   Alcohol abuse   HTN (hypertension), malignant   Total Time spent with patient: 1 hour  Subjective:   Nicole OlszewskiKimberly Dawn [name] is a 58 y.o. female patient reports anxiety but no suicidal/homicidal ideations, hallucinations, or withdrawal symptoms.  She feels "confined" in her room and wants to discharge.  Her psychiatrist is Dr Janeece RiggersSu and she plans on continuing her care with him.  HPI:  58 yo female who presented to the ED with chest pain with alcohol abuse and anxiety.  BAL of 198, lower than in the past of 373 and 467.  Does not feel her alcohol is an issue and declines rehab assistance.  Anxiety 5/10, increased with feeling "confined", decreases with Klonopin.  Focused on benzodiazepines, educated her about the use of benzodiazepines and alcohol abuse being a double depressant and should be avoided, especially with her COPD.  Then, she shifted quickly to her insomnia and she had the Klonopin last night, "slept an hour and a half".  Explained another medication would be more appropriate with better effects.  She reported amitriptyline helped her in the past to sleep.  Stable for discharge at this time.  Lives with her husband and three children.  Past Psychiatric History: substance abuse, depression, anxiety  Risk to Self:  none Risk to Others:  none Prior Inpatient Therapy:  none Prior Outpatient Therapy:  Dr Janeece RiggersSu  Past Medical History:  Past Medical History:  Diagnosis Date  . Anxiety   . Asthma   . Collagen vascular disease (HCC)   . COPD (chronic obstructive pulmonary disease) (HCC)   . Hepatitis C   . Hypertension     Past Surgical History:  Procedure Laterality Date  . ABDOMINAL HYSTERECTOMY    . FOOT SURGERY     Family History:  Family  History  Problem Relation Age of Onset  . Rheum arthritis Other   . Osteoporosis Other   . Clotting disorder Other   . Stroke Other    Family Psychiatric  History: none Social History:  Social History   Substance and Sexual Activity  Alcohol Use Yes     Social History   Substance and Sexual Activity  Drug Use No    Social History   Socioeconomic History  . Marital status: Married    Spouse name: Not on file  . Number of children: Not on file  . Years of education: Not on file  . Highest education level: Not on file  Occupational History  . Not on file  Social Needs  . Financial resource strain: Not on file  . Food insecurity    Worry: Not on file    Inability: Not on file  . Transportation needs    Medical: Not on file    Non-medical: Not on file  Tobacco Use  . Smoking status: Current Every Day Smoker    Packs/day: 1.00    Types: Cigarettes  . Smokeless tobacco: Never Used  Substance and Sexual Activity  . Alcohol use: Yes  . Drug use: No  . Sexual activity: Not on file  Lifestyle  . Physical activity    Days per week: Not on file    Minutes per session: Not on file  . Stress: Not on file  Relationships  . Social connections  Talks on phone: Not on file    Gets together: Not on file    Attends religious service: Not on file    Active member of club or organization: Not on file    Attends meetings of clubs or organizations: Not on file    Relationship status: Not on file  Other Topics Concern  . Not on file  Social History Narrative  . Not on file   Additional Social History: Specify valuables returned: clothing, glasses,. cell phone, necklace, bracelet  Allergies:  No Known Allergies  Labs:  Results for orders placed or performed during the hospital encounter of 12/10/18 (from the past 48 hour(s))  Troponin I - Now Then Q6H     Status: None   Collection Time: 12/10/18  4:00 PM  Result Value Ref Range   Troponin I <0.03 <0.03 ng/mL     Comment: Performed at [facility], [address] [place], [place] [address]  Troponin I - Now Then Q6H     Status: None   Collection Time: 12/10/18 10:15 PM  Result Value Ref Range   Troponin I <0.03 <0.03 ng/mL    Comment: Performed at [facility], [address] [place], [place] [address]  Troponin I - Now Then Q6H     Status: None   Collection Time: 12/11/18  2:38 AM  Result Value Ref Range   Troponin I <0.03 <0.03 ng/mL    Comment: Performed at [facility], [address] [place], [place] [address]    Current Facility-Administered Medications  Medication Dose Route Frequency Provider Last Rate Last Dose  . acetaminophen (TYLENOL) tablet 650 mg  650 mg Oral Q6H PRN Fritzi Mandes, MD   650 mg at 12/12/18 4193   Or  . acetaminophen (TYLENOL) suppository 650 mg  650 mg Rectal Q6H PRN Fritzi Mandes, MD      . albuterol (PROVENTIL) (2.5 MG/3ML) 0.083% nebulizer solution 2.5 mg  2.5 mg Nebulization Q2H PRN Fritzi Mandes, MD      . amitriptyline (ELAVIL) tablet 25 mg  25 mg Oral QHS PRN Lance Coon, MD   25 mg at 12/11/18 2038  . aspirin EC tablet 81 mg  81 mg Oral Daily Fritzi Mandes, MD   81 mg at 12/12/18 0819  . carvedilol (COREG) tablet 12.5 mg  12.5 mg Oral BID WC Fritzi Mandes, MD   12.5 mg at 12/12/18 0821  . clonazePAM (KLONOPIN) tablet 0.25 mg  0.25 mg Oral BID Fritzi Mandes, MD   0.25 mg at 12/12/18 0820  . enoxaparin (LOVENOX) injection 40 mg  40 mg Subcutaneous Q24H Fritzi Mandes, MD   40 mg at 12/11/18 2038  . folic acid (FOLVITE) tablet 1 mg  1 mg Oral Daily Fritzi Mandes, MD   1 mg at 12/12/18 7902  . hydrALAZINE (APRESOLINE) injection 10 mg  10 mg Intravenous Q6H PRN Fritzi Mandes, MD   10 mg at 12/11/18 1222  . lisinopril (ZESTRIL) tablet 10 mg  10 mg Oral Daily Fritzi Mandes, MD   10 mg at 12/12/18 0820  . LORazepam (ATIVAN) tablet 1 mg  1 mg Oral Q4H PRN Fritzi Mandes, MD   1 mg at 12/12/18 1323  . multivitamin with minerals tablet 1 tablet  1 tablet Oral  Daily Fritzi Mandes, MD   1 tablet at 12/12/18 0819  . nicotine (NICODERM CQ - dosed in mg/24 hours) patch 21 mg  21 mg Transdermal Daily Fritzi Mandes, MD   21 mg at 12/12/18  [id number]  . ondansetron (ZOFRAN) tablet 4 mg  4 mg Oral Q6H PRN Enedina FinnerPatel, Sona, MD       Or  . ondansetron [facility](ZOFRAN) injection 4 mg  4 mg Intravenous Q6H PRN Enedina FinnerPatel, Sona, MD      . polyethylene glycol (MIRALAX / GLYCOLAX) packet 17 g  17 g Oral Daily PRN Enedina FinnerPatel, Sona, MD      . sodium chloride flush (NS) 0.9 % injection 3 mL  3 mL Intravenous Q12H Enedina FinnerPatel, Sona, MD   3 mL at 12/12/18 0827  . sodium chloride flush (NS) 0.9 % injection 3 mL  3 mL Intravenous PRN Enedina FinnerPatel, Sona, MD      . thiamine (VITAMIN B-1) tablet 100 mg  100 mg Oral Daily Enedina FinnerPatel, Sona, MD   100 mg at 12/12/18 [id number]    Musculoskeletal: Strength & Muscle Tone: within normal limits Gait & Station: normal Patient leans: N/A  Psychiatric Specialty Exam: Physical Exam  Nursing note and vitals reviewed. Constitutional: She is oriented to person, place, and time. She appears well-developed and well-nourished.  HENT:  Head: Normocephalic.  Neck: Normal range of motion.  Respiratory: Effort normal.  Musculoskeletal: Normal range of motion.  Neurological: She is alert and oriented to person, place, and time.  Psychiatric: Her speech is normal and behavior is normal. Judgment and thought content normal. Her mood appears anxious. Her affect is blunt. Cognition and memory are normal.    Review of Systems  Psychiatric/Behavioral: Positive for substance abuse. The patient is nervous/anxious.   All other systems reviewed and are negative.   Blood pressure (!) 127/97, pulse 84, temperature 97.6 F (36.4 C), temperature source Oral, resp. rate 18, height 5\' 3"  (1.6 m), weight 50.4 kg, SpO2 99 %.Body mass index is 19.68 kg/m.  General Appearance: Casual  Eye Contact:  Good  Speech:  Normal Rate  Volume:  Normal  Mood:  Anxious  Affect:  Blunt  Thought Process:  Coherent and  Descriptions of Associations: Intact  Orientation:  Full (Time, Place, and Person)  Thought Content:  WDL and Logical  Suicidal Thoughts:  No  Homicidal Thoughts:  No  Memory:  Immediate;   Good Recent;   Good Remote;   Good  Judgement:  Fair  Insight:  Fair  Psychomotor Activity:  Normal  Concentration:  Concentration: Good and Attention Span: Good  Recall:  Good  Fund of Knowledge:  Good  Language:  Good  Akathisia:  No  Handed:  Right  AIMS (if indicated):     Assets:  Housing Leisure Time Physical Health Resilience Social Support  ADL's:  Intact  Cognition:  WNL  Sleep:        Treatment Plan Summary: Daily contact with patient to assess and evaluate symptoms and progress in treatment, Medication management and Plan Anxiety:  -Follow up with Dr Janeece RiggersSu  -Discontinue Klonopin 0.25 mg BID  Insomnia: -Started amitriptyline 50 mg at bedtime PRN sleep  Alcohol abuse: -Ativan alcohol detox protocol in hospital -Encouraged AA   Disposition: No evidence of imminent risk to self or others at present.    Nanine MeansJamison Lord, NP 12/12/2018 3:32 PM

## 2018-12-12 NOTE — Progress Notes (Signed)
Discharged to home with husband.  Medications obtained by case management at [facility] for a lower cost.

## 2018-12-12 NOTE — Discharge Instructions (Signed)
Advised not to drink alcohol. Advised smoking cessation Pt is recommended to [facility]

## 2018-12-12 NOTE — Discharge Summary (Addendum)
[facility] -  at [facility]   PATIENT NAME: Nicole [name]    MR#:  [id number]  DATE OF BIRTH:  07-Dec-1960  DATE OF ADMISSION:  12/10/2018 ADMITTING PHYSICIAN: Enedina FinnerSona Shiva Sahagian, MD  DATE OF DISCHARGE: 12/12/2018  PRIMARY CARE PHYSICIAN: [facility]    ADMISSION DIAGNOSIS:  Tachycardia [R00.0] Hypertension, unspecified type [I10]  DISCHARGE DIAGNOSIS:  malignant hypertension secondary to medical noncompliance improved alcohol abuse chronic anxiety/insomnia  SECONDARY DIAGNOSIS:   Past Medical History:  Diagnosis Date  . Anxiety   . Asthma   . Collagen vascular disease (HCC)   . COPD (chronic obstructive pulmonary disease) (HCC)   . Hepatitis C   . Hypertension     HOSPITAL COURSE:   KimberlyBuntinis a58 y.o.femalewith a known history of hypertension not on any medication, chronic anxiety not on medicine drinks alcohol on a regular basis although patient stays she is not comes to the emergency room with shaking, feeling anxious and shortness of breath with chest pain.  1.Malignant hypertension in the setting of anxiety with history of noncompliance to blood pressure meds -PO coreg, lisinopril BID - PRN IV hydralazine -blood pressure much improved  2.Alcohol withdrawal with anxiety -continueCIWAprotocol with oral Ativan -PO multivitamin -advised abstinence from alcohol -psychiatry consult-- patient will be discharged on amitryptiline 25 mg qhs prn She feels a lot better  -pt used to see Dr Janeece RiggersSu --asked to see her -advised abstinence from alcohol.  3.Sinus tachycardia -- improved. Heart rate now 84 to 89 -patient on coreg -heart rate much improved  4.Chest pain with shortness of breath suspected due to anxiety -troponin x 4 negative -aspirin 81 mg daily -EKG shows sinus tach without any ST elevation her depression  5.DVT prophylaxis subcu Lovenox  Will discharge her later after seen by Psych  NP--pt aware CM for d/c planing-- community alcohol abuse resources, open-door clinic follow-up CONSULTS OBTAINED:  Treatment Team:  Nicole CraftMaurer, Sheila M, MD  DRUG ALLERGIES:  No Known Allergies  DISCHARGE MEDICATIONS:   Allergies as of 12/12/2018   No Known Allergies     Medication List    TAKE these medications   amitriptyline 25 MG tablet Commonly known as: ELAVIL Take 1 tablet (25 mg total) by mouth at bedtime as needed for sleep.   aspirin 81 MG EC tablet Take 1 tablet (81 mg total) by mouth daily. Start taking on: December 13, 2018   carvedilol 12.5 MG tablet Commonly known as: COREG Take 1 tablet (12.5 mg total) by mouth 2 (two) times daily with a meal.   lisinopril 10 MG tablet Commonly known as: ZESTRIL Take 1 tablet (10 mg total) by mouth daily. Start taking on: December 13, 2018   multivitamin with minerals Tabs tablet Take 1 tablet by mouth daily. Start taking on: December 13, 2018       If you experience worsening of your admission symptoms, develop shortness of breath, life threatening emergency, suicidal or homicidal thoughts you must seek medical attention immediately by calling 911 or calling your MD immediately  if symptoms less severe.  You Must read complete instructions/literature along with all the possible adverse reactions/side effects for all the Medicines you take and that have been prescribed to you. Take any new Medicines after you have completely understood and accept all the possible adverse reactions/side effects.   Please note  You were cared for by a hospitalist during your hospital stay. If you have any questions about your discharge medications or the care you  received while you were in the hospital after you are discharged, you can call the unit and asked to speak with the hospitalist on call if the hospitalist that took care of you is not available. Once you are discharged, your primary care physician will handle any further medical issues. Please  note that NO REFILLS for any discharge medications will be authorized once you are discharged, as it is imperative that you return to your primary care physician (or establish a relationship with a primary care physician if you do not have one) for your aftercare needs so that they can reassess your need for medications and monitor your lab values. Today   SUBJECTIVE  feels better. Less anxious. Slept well last night  VITAL SIGNS:  Blood pressure (!) 127/97, pulse 84, temperature 97.6 F (36.4 C), temperature source Oral, resp. rate 18, height 5\' 3"  (1.6 m), weight 50.4 kg, SpO2 99 %.  I/O:    Intake/Output Summary (Last 24 hours) at 12/12/2018 1502 Last data filed at 12/11/2018 1829 Gross per 24 hour  Intake 240 ml  Output -  Net 240 ml    PHYSICAL EXAMINATION:  GENERAL:  58 y.o.-year-old patient lying in the bed with no acute distress.  EYES: Pupils equal, round, reactive to light and accommodation. No scleral icterus. Extraocular muscles intact.  HEENT: Head atraumatic, normocephalic. Oropharynx and nasopharynx clear.  NECK:  Supple, no jugular venous distention. No thyroid enlargement, no tenderness.  LUNGS: Normal breath sounds bilaterally, no wheezing, rales,rhonchi or crepitation. No use of accessory muscles of respiration.  CARDIOVASCULAR: S1, S2 normal. No murmurs, rubs, or gallops.  ABDOMEN: Soft, non-tender, non-distended. Bowel sounds present. No organomegaly or mass.  EXTREMITIES: No pedal edema, cyanosis, or clubbing.  NEUROLOGIC: Cranial nerves II through XII are intact. Muscle strength 5/5 in all extremities. Sensation intact. Gait not checked.  PSYCHIATRIC: The patient is alert and oriented x 3.  SKIN: No obvious rash, lesion, or ulcer.   DATA REVIEW:   CBC  Recent Labs  Lab 12/10/18 0603  WBC 6.1  HGB 14.7  HCT 41.8  PLT 256    Chemistries  Recent Labs  Lab 12/10/18 0603  NA 140  K 3.5  CL 104  CO2 21*  GLUCOSE 132*  BUN 24*  CREATININE 0.85   CALCIUM 9.0    Microbiology Results   Recent Results (from the past 240 hour(s))  Novel Coronavirus,NAA,(SEND-OUT TO REF LAB - TAT 24-48 hrs); Hosp Order     Status: None   Collection Time: 12/10/18 12:10 PM   Specimen: Nasopharyngeal Swab; Respiratory  Result Value Ref Range Status   SARS-CoV-2, NAA NOT DETECTED NOT DETECTED Final    Comment: (NOTE) This test was developed and its performance characteristics determined by [employer]. This test has not been FDA cleared or approved. This test has been authorized by FDA under an Emergency Use Authorization (EUA). This test is only authorized for the duration of time the declaration that circumstances exist justifying the authorization of the emergency use of in vitro diagnostic tests for detection of SARS-CoV-2 virus and/or diagnosis of COVID-19 infection under section 564(b)(1) of the Act, 21 U.S.C. 202RKY-7(C)(6), unless the authorization is terminated or revoked sooner. When diagnostic testing is negative, the possibility of a false negative result should be considered in the context of a patient's recent exposures and the presence of clinical signs and symptoms consistent with COVID-19. An individual without symptoms of COVID-19 and who is not shedding SARS-CoV-2 virus would expect to have  a negative (not detected) result in this assay. Performed  At: [facility] [address] [place], [place] [id number] Jolene SchimkeNagendra Sanjai MD WU:[id number]:[phone]    Coronavirus Source NASOPHARYNGEAL  Final    Comment: Performed at [facility], [address] [place], [place] [address]    RADIOLOGY:  No results found.   CODE STATUS:     Code Status Orders  (From admission, onward)         Start     Ordered   12/10/18 1454  Full code  Continuous     12/10/18 1453        Code Status History    Date Active Date Inactive Code Status Order ID Comments User Context   10/06/2015 0349 10/09/2015 1950 Full Code  [id number]  Oralia ManisWillis, David, MD Inpatient   Advance Care Planning Activity      TOTAL TIME TAKING CARE OF THIS PATIENT: *40* minutes.    Enedina FinnerSona Kandance Yano M.D on 12/12/2018 at 3:02 PM  Between 7am to 6pm - Pager - [phone] After 6pm go to [email or web address] - Social research officer, governmentpassword EPAS [facility]  [facility]  Office  [phone]  CC: Primary care physician; [facility]

## 2018-12-13 LAB — ECHOCARDIOGRAM COMPLETE
Height: 63 in
Weight: 1777.6 oz

## 2019-01-24 ENCOUNTER — Emergency Department
Admission: EM | Admit: 2019-01-24 | Discharge: 2019-01-25 | Disposition: A | Discharge status: Home / Self Care | Payer: Self-pay | Attending: Emergency Medicine | Admitting: Emergency Medicine

## 2019-01-24 ENCOUNTER — Emergency Department: Payer: Self-pay

## 2019-01-24 ENCOUNTER — Other Ambulatory Visit: Payer: Self-pay

## 2019-01-24 DIAGNOSIS — Y908 Blood alcohol level of 240 mg/100 ml or more: Secondary | ICD-10-CM | POA: Insufficient documentation

## 2019-01-24 DIAGNOSIS — J45909 Unspecified asthma, uncomplicated: Secondary | ICD-10-CM | POA: Insufficient documentation

## 2019-01-24 DIAGNOSIS — S0101XA Laceration without foreign body of scalp, initial encounter: Secondary | ICD-10-CM | POA: Insufficient documentation

## 2019-01-24 DIAGNOSIS — F10920 Alcohol use, unspecified with intoxication, uncomplicated: Secondary | ICD-10-CM | POA: Insufficient documentation

## 2019-01-24 DIAGNOSIS — X58XXXA Exposure to other specified factors, initial encounter: Secondary | ICD-10-CM | POA: Insufficient documentation

## 2019-01-24 DIAGNOSIS — J449 Chronic obstructive pulmonary disease, unspecified: Secondary | ICD-10-CM | POA: Insufficient documentation

## 2019-01-24 DIAGNOSIS — Y999 Unspecified external cause status: Secondary | ICD-10-CM | POA: Insufficient documentation

## 2019-01-24 DIAGNOSIS — Y929 Unspecified place or not applicable: Secondary | ICD-10-CM | POA: Insufficient documentation

## 2019-01-24 DIAGNOSIS — I10 Essential (primary) hypertension: Secondary | ICD-10-CM | POA: Insufficient documentation

## 2019-01-24 DIAGNOSIS — F17210 Nicotine dependence, cigarettes, uncomplicated: Secondary | ICD-10-CM | POA: Insufficient documentation

## 2019-01-24 DIAGNOSIS — Z7982 Long term (current) use of aspirin: Secondary | ICD-10-CM | POA: Insufficient documentation

## 2019-01-24 DIAGNOSIS — Z79899 Other long term (current) drug therapy: Secondary | ICD-10-CM | POA: Insufficient documentation

## 2019-01-24 DIAGNOSIS — Y939 Activity, unspecified: Secondary | ICD-10-CM | POA: Insufficient documentation

## 2019-01-24 LAB — COMPREHENSIVE METABOLIC PANEL
ALT: 93 U/L — ABNORMAL HIGH (ref 0–44)
AST: 135 U/L — ABNORMAL HIGH (ref 15–41)
Albumin: 4 g/dL (ref 3.5–5.0)
Alkaline Phosphatase: 73 U/L (ref 38–126)
Anion gap: 9 (ref 5–15)
BUN: 22 mg/dL — ABNORMAL HIGH (ref 6–20)
CO2: 23 mmol/L (ref 22–32)
Calcium: 8.8 mg/dL — ABNORMAL LOW (ref 8.9–10.3)
Chloride: 115 mmol/L — ABNORMAL HIGH (ref 98–111)
Creatinine, Ser: 0.98 mg/dL (ref 0.44–1.00)
GFR calc Af Amer: >60 mL/min (ref >60)
GFR calc non Af Amer: >60 mL/min (ref >60)
Glucose, Bld: 92 mg/dL (ref 70–99)
Potassium: 3.5 mmol/L (ref 3.5–5.1)
Sodium: 147 mmol/L — ABNORMAL HIGH (ref 135–145)
Total Bilirubin: 0.6 mg/dL (ref 0.3–1.2)
Total Protein: 7.7 g/dL (ref 6.5–8.1)

## 2019-01-24 LAB — ETHANOL: Alcohol, Ethyl (B): 382 mg/dL (ref <10)

## 2019-01-24 LAB — CBC
HCT: 42.4 % (ref 36.0–46.0)
Hemoglobin: 14.3 g/dL (ref 12.0–15.0)
MCH: 33.4 pg (ref 26.0–34.0)
MCHC: 33.7 g/dL (ref 30.0–36.0)
MCV: 99.1 fL (ref 80.0–100.0)
Platelets: 170 10*3/uL (ref 150–400)
RBC: 4.28 MIL/uL (ref 3.87–5.11)
RDW: 13 % (ref 11.5–15.5)
WBC: 6.8 10*3/uL (ref 4.0–10.5)
nRBC: 0 % (ref 0.0–0.2)

## 2019-01-24 NOTE — ED Notes (Signed)
Bed alarm set up for patient at this time. Yellow socks. Sign on door. Pt oriented to provide instructions about not getting up.

## 2019-01-24 NOTE — ED Notes (Signed)
Pt resting in NAD at this time.
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
## 2019-01-25 NOTE — ED Notes (Signed)
Reviewed discharge instructions, follow-up care, and laceration care with patient. Patient verbalized understanding of all information reviewed. Patient stable, with no distress noted at this time.   ° °

## 2019-01-25 NOTE — ED Notes (Signed)
This RN at bedside with MD Owens Shark. MD cleaned laceration to patient's posterior left head. MD administered lidocaine. MD placed 4 staples on clean site.

## 2020-08-31 ENCOUNTER — Inpatient Hospital Stay: Payer: Self-pay

## 2020-08-31 ENCOUNTER — Inpatient Hospital Stay
Admission: EM | Admit: 2020-08-31 | Discharge: 2020-09-06 | DRG: 177 | Disposition: A | Discharge status: Other Acute Inpatient Hospital | Payer: Self-pay | Attending: Internal Medicine | Admitting: Internal Medicine

## 2020-08-31 ENCOUNTER — Other Ambulatory Visit: Payer: Self-pay

## 2020-08-31 ENCOUNTER — Emergency Department: Payer: Self-pay

## 2020-08-31 DIAGNOSIS — Z90710 Acquired absence of both cervix and uterus: Secondary | ICD-10-CM

## 2020-08-31 DIAGNOSIS — Z938 Other artificial opening status: Secondary | ICD-10-CM

## 2020-08-31 DIAGNOSIS — E871 Hypo-osmolality and hyponatremia: Secondary | ICD-10-CM | POA: Diagnosis present

## 2020-08-31 DIAGNOSIS — R64 Cachexia: Secondary | ICD-10-CM | POA: Diagnosis present

## 2020-08-31 DIAGNOSIS — R0902 Hypoxemia: Secondary | ICD-10-CM | POA: Diagnosis not present

## 2020-08-31 DIAGNOSIS — Z720 Tobacco use: Secondary | ICD-10-CM

## 2020-08-31 DIAGNOSIS — R069 Unspecified abnormalities of breathing: Secondary | ICD-10-CM | POA: Diagnosis not present

## 2020-08-31 DIAGNOSIS — Z8701 Personal history of pneumonia (recurrent): Secondary | ICD-10-CM

## 2020-08-31 DIAGNOSIS — F17210 Nicotine dependence, cigarettes, uncomplicated: Secondary | ICD-10-CM | POA: Diagnosis present

## 2020-08-31 DIAGNOSIS — Z8619 Personal history of other infectious and parasitic diseases: Secondary | ICD-10-CM

## 2020-08-31 DIAGNOSIS — F17200 Nicotine dependence, unspecified, uncomplicated: Secondary | ICD-10-CM | POA: Diagnosis present

## 2020-08-31 DIAGNOSIS — R911 Solitary pulmonary nodule: Secondary | ICD-10-CM | POA: Diagnosis present

## 2020-08-31 DIAGNOSIS — F419 Anxiety disorder, unspecified: Secondary | ICD-10-CM | POA: Diagnosis present

## 2020-08-31 DIAGNOSIS — J9811 Atelectasis: Secondary | ICD-10-CM | POA: Diagnosis present

## 2020-08-31 DIAGNOSIS — E43 Unspecified severe protein-calorie malnutrition: Secondary | ICD-10-CM | POA: Insufficient documentation

## 2020-08-31 DIAGNOSIS — R0602 Shortness of breath: Secondary | ICD-10-CM

## 2020-08-31 DIAGNOSIS — I712 Thoracic aortic aneurysm, without rupture: Secondary | ICD-10-CM | POA: Diagnosis present

## 2020-08-31 DIAGNOSIS — J189 Pneumonia, unspecified organism: Secondary | ICD-10-CM | POA: Diagnosis not present

## 2020-08-31 DIAGNOSIS — E861 Hypovolemia: Secondary | ICD-10-CM | POA: Diagnosis present

## 2020-08-31 DIAGNOSIS — J15212 Pneumonia due to Methicillin resistant Staphylococcus aureus: Secondary | ICD-10-CM | POA: Diagnosis present

## 2020-08-31 DIAGNOSIS — M359 Systemic involvement of connective tissue, unspecified: Secondary | ICD-10-CM | POA: Diagnosis present

## 2020-08-31 DIAGNOSIS — J948 Other specified pleural conditions: Secondary | ICD-10-CM | POA: Diagnosis present

## 2020-08-31 DIAGNOSIS — J942 Hemothorax: Secondary | ICD-10-CM

## 2020-08-31 DIAGNOSIS — N390 Urinary tract infection, site not specified: Secondary | ICD-10-CM

## 2020-08-31 DIAGNOSIS — J90 Pleural effusion, not elsewhere classified: Secondary | ICD-10-CM

## 2020-08-31 DIAGNOSIS — J439 Emphysema, unspecified: Secondary | ICD-10-CM | POA: Diagnosis present

## 2020-08-31 DIAGNOSIS — Z79899 Other long term (current) drug therapy: Secondary | ICD-10-CM

## 2020-08-31 DIAGNOSIS — B1920 Unspecified viral hepatitis C without hepatic coma: Secondary | ICD-10-CM

## 2020-08-31 DIAGNOSIS — F319 Bipolar disorder, unspecified: Secondary | ICD-10-CM | POA: Diagnosis present

## 2020-08-31 DIAGNOSIS — J869 Pyothorax without fistula: Principal | ICD-10-CM

## 2020-08-31 DIAGNOSIS — R000 Tachycardia, unspecified: Secondary | ICD-10-CM | POA: Diagnosis present

## 2020-08-31 DIAGNOSIS — Z7982 Long term (current) use of aspirin: Secondary | ICD-10-CM

## 2020-08-31 DIAGNOSIS — Z681 Body mass index (BMI) 19 or less, adult: Secondary | ICD-10-CM

## 2020-08-31 DIAGNOSIS — Z20822 Contact with and (suspected) exposure to covid-19: Secondary | ICD-10-CM | POA: Diagnosis present

## 2020-08-31 DIAGNOSIS — B9620 Unspecified Escherichia coli [E. coli] as the cause of diseases classified elsewhere: Secondary | ICD-10-CM | POA: Diagnosis present

## 2020-08-31 DIAGNOSIS — D75838 Other thrombocytosis: Secondary | ICD-10-CM | POA: Diagnosis present

## 2020-08-31 DIAGNOSIS — J449 Chronic obstructive pulmonary disease, unspecified: Secondary | ICD-10-CM

## 2020-08-31 DIAGNOSIS — I10 Essential (primary) hypertension: Secondary | ICD-10-CM | POA: Diagnosis present

## 2020-08-31 LAB — URINALYSIS, COMPLETE (UACMP) WITH MICROSCOPIC
Bilirubin Urine: NEGATIVE
Glucose, UA: NEGATIVE mg/dL
Hgb urine dipstick: NEGATIVE
Ketones, ur: NEGATIVE mg/dL
Nitrite: POSITIVE — AB
Protein, ur: NEGATIVE mg/dL
Specific Gravity, Urine: 1.021 (ref 1.005–1.030)
Squamous Epithelial / HPF: NONE SEEN (ref 0–5)
pH: 6 (ref 5.0–8.0)

## 2020-08-31 LAB — LACTIC ACID, PLASMA: Lactic Acid, Venous: 0.9 mmol/L (ref 0.5–1.9)

## 2020-08-31 LAB — BASIC METABOLIC PANEL
Anion gap: 9 (ref 5–15)
BUN: 10 mg/dL (ref 6–20)
CO2: 22 mmol/L (ref 22–32)
Calcium: 9 mg/dL (ref 8.9–10.3)
Chloride: 97 mmol/L — ABNORMAL LOW (ref 98–111)
Creatinine, Ser: 1.05 mg/dL — ABNORMAL HIGH (ref 0.44–1.00)
GFR, Estimated: >60 mL/min (ref >60)
Glucose, Bld: 119 mg/dL — ABNORMAL HIGH (ref 70–99)
Potassium: 3.7 mmol/L (ref 3.5–5.1)
Sodium: 128 mmol/L — ABNORMAL LOW (ref 135–145)

## 2020-08-31 LAB — CBC
HCT: 32.9 % — ABNORMAL LOW (ref 36.0–46.0)
Hemoglobin: 11.1 g/dL — ABNORMAL LOW (ref 12.0–15.0)
MCH: 32.1 pg (ref 26.0–34.0)
MCHC: 33.7 g/dL (ref 30.0–36.0)
MCV: 95.1 fL (ref 80.0–100.0)
Platelets: 463 10*3/uL — ABNORMAL HIGH (ref 150–400)
RBC: 3.46 MIL/uL — ABNORMAL LOW (ref 3.87–5.11)
RDW: 15.4 % (ref 11.5–15.5)
WBC: 10.7 10*3/uL — ABNORMAL HIGH (ref 4.0–10.5)
nRBC: 0 % (ref 0.0–0.2)

## 2020-08-31 LAB — RESP PANEL BY RT-PCR (FLU A&B, COVID) ARPGX2
Influenza A by PCR: NEGATIVE
Influenza B by PCR: NEGATIVE
SARS Coronavirus 2 by RT PCR: NEGATIVE

## 2020-08-31 LAB — PREGNANCY, URINE: Preg Test, Ur: NEGATIVE

## 2020-08-31 LAB — TROPONIN I (HIGH SENSITIVITY)
Troponin I (High Sensitivity): 5 ng/L (ref <18)
Troponin I (High Sensitivity): 6 ng/L (ref <18)

## 2020-08-31 LAB — PROTIME-INR
INR: 1.2 (ref 0.8–1.2)
Prothrombin Time: 14.4 seconds (ref 11.4–15.2)

## 2020-08-31 LAB — APTT: aPTT: 41 seconds — ABNORMAL HIGH (ref 24–36)

## 2020-08-31 MED ORDER — MIDAZOLAM HCL 2 MG/2ML IJ SOLN
INTRAMUSCULAR | Status: AC | PRN
  Administered 2020-08-31 (×2): 1 mg via INTRAVENOUS

## 2020-08-31 MED ORDER — MIDAZOLAM HCL 2 MG/2ML IJ SOLN
INTRAMUSCULAR | Status: AC
  Filled 2020-08-31: qty 4

## 2020-08-31 MED ORDER — IOHEXOL 300 MG/ML  SOLN
75.0000 mL | Freq: Once | INTRAMUSCULAR | Status: AC | PRN
  Administered 2020-08-31: 75 mL via INTRAVENOUS

## 2020-08-31 MED ORDER — CARVEDILOL 12.5 MG PO TABS
12.5000 mg | ORAL_TABLET | Freq: Two times a day (BID) | ORAL | Status: DC
  Administered 2020-08-31 – 2020-09-06 (×12): 12.5 mg via ORAL
  Filled 2020-08-31: qty 2
  Filled 2020-08-31 (×11): qty 1

## 2020-08-31 MED ORDER — ACETAMINOPHEN 650 MG RE SUPP: 650.0000 mg | Freq: Four times a day (QID) | RECTAL | Status: DC | PRN

## 2020-08-31 MED ORDER — SODIUM CHLORIDE 0.9% FLUSH
3.0000 mL | Freq: Two times a day (BID) | INTRAVENOUS | Status: DC
  Administered 2020-08-31 – 2020-09-06 (×11): 3 mL via INTRAVENOUS

## 2020-08-31 MED ORDER — LISINOPRIL 10 MG PO TABS
10.0000 mg | ORAL_TABLET | Freq: Every day | ORAL | Status: DC
  Administered 2020-08-31 – 2020-09-06 (×7): 10 mg via ORAL
  Filled 2020-08-31 (×7): qty 1

## 2020-08-31 MED ORDER — ADULT MULTIVITAMIN W/MINERALS CH
1.0000 | ORAL_TABLET | Freq: Every day | ORAL | Status: DC
  Administered 2020-09-01 – 2020-09-06 (×6): 1 via ORAL
  Filled 2020-08-31 (×6): qty 1

## 2020-08-31 MED ORDER — NICOTINE 14 MG/24HR TD PT24
14.0000 mg | MEDICATED_PATCH | Freq: Every day | TRANSDERMAL | Status: DC
  Administered 2020-08-31 – 2020-09-06 (×7): 14 mg via TRANSDERMAL
  Filled 2020-08-31 (×7): qty 1

## 2020-08-31 MED ORDER — ONDANSETRON HCL 4 MG PO TABS: 4.0000 mg | ORAL_TABLET | Freq: Four times a day (QID) | ORAL | Status: DC | PRN

## 2020-08-31 MED ORDER — SODIUM CHLORIDE 0.9 % IV SOLN
500.0000 mg | Freq: Once | INTRAVENOUS | Status: AC
  Administered 2020-09-01: 500 mg via INTRAVENOUS
  Filled 2020-08-31 (×2): qty 500

## 2020-08-31 MED ORDER — ENOXAPARIN SODIUM 40 MG/0.4ML ~~LOC~~ SOLN
40.0000 mg | SUBCUTANEOUS | Status: DC
  Filled 2020-08-31 (×3): qty 0.4

## 2020-08-31 MED ORDER — ONDANSETRON HCL 4 MG/2ML IJ SOLN
4.0000 mg | Freq: Four times a day (QID) | INTRAMUSCULAR | Status: DC | PRN
  Administered 2020-09-06: 4 mg via INTRAVENOUS
  Filled 2020-08-31: qty 2

## 2020-08-31 MED ORDER — SODIUM CHLORIDE 0.9 % IV SOLN
500.0000 mg | Freq: Once | INTRAVENOUS | Status: AC
  Administered 2020-08-31: 500 mg via INTRAVENOUS
  Filled 2020-08-31: qty 500

## 2020-08-31 MED ORDER — SODIUM CHLORIDE 0.9% FLUSH: 3.0000 mL | INTRAVENOUS | Status: DC | PRN

## 2020-08-31 MED ORDER — FENTANYL CITRATE (PF) 100 MCG/2ML IJ SOLN
INTRAMUSCULAR | Status: AC
  Filled 2020-08-31: qty 4

## 2020-08-31 MED ORDER — ENOXAPARIN SODIUM 40 MG/0.4ML ~~LOC~~ SOLN: 40.0000 mg | SUBCUTANEOUS | Status: DC

## 2020-08-31 MED ORDER — FENTANYL CITRATE (PF) 100 MCG/2ML IJ SOLN
INTRAMUSCULAR | Status: AC | PRN
  Administered 2020-08-31 (×2): 50 ug via INTRAVENOUS

## 2020-08-31 MED ORDER — ACETAMINOPHEN 325 MG PO TABS: 650.0000 mg | ORAL_TABLET | Freq: Four times a day (QID) | ORAL | Status: DC | PRN

## 2020-08-31 MED ORDER — SODIUM CHLORIDE 0.9 % IV SOLN
INTRAVENOUS | Status: AC | PRN
  Administered 2020-08-31: 10 mL/h via INTRAVENOUS

## 2020-08-31 MED ORDER — SODIUM CHLORIDE 0.9 % IV BOLUS (SEPSIS)
1000.0000 mL | Freq: Once | INTRAVENOUS | Status: AC
  Administered 2020-08-31: 1000 mL via INTRAVENOUS

## 2020-08-31 MED ORDER — SODIUM CHLORIDE 0.9 % IV SOLN: INTRAVENOUS | Status: DC

## 2020-08-31 MED ORDER — SODIUM CHLORIDE 0.9 % IV SOLN: 1.0000 g | INTRAVENOUS | Status: DC

## 2020-08-31 MED ORDER — SODIUM CHLORIDE 0.9 % IV SOLN
2.0000 g | Freq: Once | INTRAVENOUS | Status: AC
  Administered 2020-08-31: 2 g via INTRAVENOUS
  Filled 2020-08-31: qty 2

## 2020-08-31 MED ORDER — SODIUM CHLORIDE 0.9 % IV SOLN
2.0000 g | INTRAVENOUS | Status: DC
  Administered 2020-08-31: 2 g via INTRAVENOUS
  Filled 2020-08-31 (×2): qty 20

## 2020-08-31 MED ORDER — HYDROCODONE-ACETAMINOPHEN 5-325 MG PO TABS
1.0000 | ORAL_TABLET | Freq: Four times a day (QID) | ORAL | Status: DC | PRN
Start: 2020-08-31 — End: 2020-09-01
  Administered 2020-08-31 – 2020-09-01 (×3): 1 via ORAL
  Filled 2020-08-31 (×3): qty 1

## 2020-08-31 MED ORDER — VANCOMYCIN HCL IN DEXTROSE 1-5 GM/200ML-% IV SOLN
1000.0000 mg | Freq: Once | INTRAVENOUS | Status: AC
  Administered 2020-08-31: 1000 mg via INTRAVENOUS
  Filled 2020-08-31: qty 200

## 2020-08-31 MED ORDER — SODIUM CHLORIDE 0.9 % IV SOLN
250.0000 mL | INTRAVENOUS | Status: DC | PRN
  Administered 2020-09-03: 250 mL via INTRAVENOUS

## 2020-08-31 NOTE — ED Provider Notes (Signed)
[facility] Emergency Department Provider Note   ____________________________________________    I have reviewed the triage vital signs and the nursing notes.   HISTORY  Chief Complaint Cough and Shortness of Breath     HPI Nicole [name] is a 60 y.o. female with history of COPD, hepatitis C, hypertension who presents with complaints of shortness of breath and chest discomfort particular on the right side.  Patient reports her breathing has been worsening over the last month.  She reports slightly over a month ago she was extremely sick with cough fevers weakness fatigue, she was never tested for COVID-19 but her son had it sometime prior to that.  She had not been vaccinated gets COVID-19.  For the last month she has felt short of breath and easily winded more so than typical.  She has pain in her right chest, she states that it feels similar to when she has had pneumonia in the past  Past Medical History:  Diagnosis Date  . Anxiety   . Asthma   . Collagen vascular disease (HCC)   . COPD (chronic obstructive pulmonary disease) (HCC)   . Hepatitis C   . Hypertension     Patient Active Problem List   Diagnosis Date Noted  . HTN (hypertension), malignant 12/10/2018  . Alcohol abuse 10/22/2016  . 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 Cocaine abuse (HCC) 10/22/2016  . Cellulitis and abscess 10/06/2015  . Sepsis (HCC) 10/06/2015  . Anxiety state 10/06/2015  . Asthma 10/06/2015  . Cellulitis     Past Surgical History:  Procedure Laterality Date  . ABDOMINAL HYSTERECTOMY    . FOOT SURGERY      Prior to Admission medications   Medication Sig Start Date End Date Taking? Authorizing Provider  amitriptyline (ELAVIL) 25 MG tablet Take 1 tablet (25 mg total) by mouth at bedtime as needed for sleep. 12/12/18   Enedina Finner, MD  aspirin 81 MG EC tablet Take 1 tablet (81 mg total) by mouth daily. 12/13/18   Enedina Finner, MD   carvedilol (COREG) 12.5 MG tablet Take 1 tablet (12.5 mg total) by mouth 2 (two) times daily with a meal. 12/12/18   Enedina Finner, MD  lisinopril (ZESTRIL) 10 MG tablet Take 1 tablet (10 mg total) by mouth daily. 12/13/18   Enedina Finner, MD  Multiple Vitamin (MULTIVITAMIN WITH MINERALS) TABS tablet Take 1 tablet by mouth daily. 12/13/18   Enedina Finner, MD     Allergies Patient has no known allergies.  Family History  Problem Relation Age of Onset  . Rheum arthritis Other   . Osteoporosis Other   . Clotting disorder Other   . Stroke Other     Social History Social History   Tobacco Use  . Smoking status: Current Every Day Smoker    Packs/day: 1.00    Types: Cigarettes  . Smokeless tobacco: Never Used  Substance Use Topics  . Alcohol use: Yes  . Drug use: No    Review of Systems  Constitutional: Intermittent chills Eyes: No visual changes.  ENT: No sore throat. Cardiovascular: As above Respiratory: As above Gastrointestinal: No abdominal pain.  No nausea, no vomiting.   Genitourinary: Negative for dysuria. Musculoskeletal: Negative for back pain. Skin: Negative for rash. Neurological: Negative for headaches or weakness   ____________________________________________   PHYSICAL EXAM:  VITAL SIGNS: ED Triage Vitals  Enc Vitals Group     BP 08/31/20 0639 (!) 131/93     Pulse  Rate 08/31/20 0639 (!) 113     Resp 08/31/20 0639 20     Temp 08/31/20 0639 99.6 F (37.6 C)     Temp Source 08/31/20 0639 Oral     SpO2 08/31/20 0639 96 %     Weight 08/31/20 0640 45.4 kg (100 lb)     Height 08/31/20 0640 1.6 m (5\' 3" )     Head Circumference --      Peak Flow --      Pain Score 08/31/20 0640 8     Pain Loc --      Pain Edu? --      Excl. in GC? --     Constitutional: Alert and oriented.   Nose: No congestion/rhinnorhea. Mouth/Throat: Mucous membranes are moist.   Neck:  Painless ROM Cardiovascular: Tachycardia regular rhythm. Grossly normal heart sounds.  Good  peripheral circulation. Respiratory: Normal respiratory effort, decreased breath sounds right lower lobe, scattered wheezes Gastrointestinal: Soft and nontender. No distention.  No CVA tenderness.  Musculoskeletal: No lower extremity tenderness nor edema.  Warm and well perfused Neurologic:  Normal speech and language. No gross focal neurologic deficits are appreciated.  Skin:  Skin is warm, dry and intact. No rash noted. Psychiatric: Mood and affect are normal. Speech and behavior are normal.  ____________________________________________   LABS (all labs ordered are listed, but only abnormal results are displayed)  Labs Reviewed  BASIC METABOLIC PANEL - Abnormal; Notable for the following components:      Result Value   Sodium 128 (*)    Chloride 97 (*)    Glucose, Bld 119 (*)    Creatinine, Ser 1.05 (*)    All other components within normal limits  CBC - Abnormal; Notable for the following components:   WBC 10.7 (*)    RBC 3.46 (*)    Hemoglobin 11.1 (*)    HCT 32.9 (*)    Platelets 463 (*)    All other components within normal limits  APTT - Abnormal; Notable for the following components:   aPTT 41 (*)    All other components within normal limits  RESP PANEL BY RT-PCR (FLU A&B, COVID) [id number]  CULTURE, BLOOD (ROUTINE X 2)  CULTURE, BLOOD (ROUTINE X 2)  URINE CULTURE  LACTIC ACID, PLASMA  PROTIME-INR  LACTIC ACID, PLASMA  URINALYSIS, COMPLETE (UACMP) WITH MICROSCOPIC  POC URINE PREG, ED  TROPONIN I (HIGH SENSITIVITY)  TROPONIN I (HIGH SENSITIVITY)   ____________________________________________  EKG  ED ECG REPORT I, 10/31/20, the attending physician, personally viewed and interpreted this ECG.  Date: 08/31/2020  Rhythm: Sinus tachycardia QRS Axis: normal Intervals: normal ST/T Wave abnormalities: normal Narrative Interpretation: no evidence of acute ischemia  ____________________________________________  RADIOLOGY  Chest x-ray reviewed by me,  hydropneumothorax on the right ____________________________________________   PROCEDURES  Procedure(s) performed: No  Procedures   Critical Care performed: yes  CRITICAL CARE Performed by: 10/31/2020   Total critical care time: 30 minutes  Critical care time was exclusive of separately billable procedures and treating other patients.  Critical care was necessary to treat or prevent imminent or life-threatening deterioration.  Critical care was time spent personally by me on the following activities: development of treatment plan with patient and/or surrogate as well as nursing, discussions with consultants, evaluation of patient's response to treatment, examination of patient, obtaining history from patient or surrogate, ordering and performing treatments and interventions, ordering and review of laboratory studies, ordering and review of radiographic studies, pulse oximetry and re-evaluation of  patient's condition.  ____________________________________________   INITIAL IMPRESSION / ASSESSMENT AND PLAN / ED COURSE  Pertinent labs & imaging results that were available during my care of the patient were reviewed by me and considered in my medical decision making (see chart for details).  Patient presents with shortness of breath worsening over the last month.  Suspicious for COVID-19 infection as the initial inciting event but now with fever tachycardia, mildly elevated white blood cell count suspicious for sepsis.  Code sepsis called broad-spectrum IV antibiotics ordered.  Chest x-ray with hydropneumothorax, in the setting of COPD.  Highly suspicious for empyema  Discussed with Dr. Thelma Barge of CT surgery, he will consult on the patient in the ED.  Patient will require admission, additional lab work pending.  Discussed with Dr. Joylene Igo of medicine team for admission    ____________________________________________   FINAL CLINICAL IMPRESSION(S) / ED DIAGNOSES  Final  diagnoses:  Empyema (HCC)  Shortness of breath        Note:  This document was prepared using Dragon voice recognition software and may include unintentional dictation errors.   Jene Every, MD 08/31/20 [phone]

## 2020-08-31 NOTE — Sepsis Progress Note (Signed)
[employer] is monitoring this Code Sepsis.

## 2020-08-31 NOTE — ED Notes (Signed)
Called lab to clarify whether urine pregnancy was run. They had not run test yet but will perform test now.

## 2020-08-31 NOTE — Consult Note (Signed)
[facility] SURGICAL CONSULTATION NOTE (initial) - cpt: 47425   HISTORY OF PRESENT ILLNESS (HPI):  60 y.o. female presented to [facility] ED today for evaluation of SOB. Patient reports a worsening of her SOB over the course of the last month, and just prior to the onset of her symptoms she had fever, cough, and fatigue. Additionally, she has been noticing increasing chest discomfort as well particularly on her right side. No abdominal pain, nausea, emesis. She has had PNA in the past and this feels very similar. She does endorse smoking history 1-2 ppd smoking history for multiple years. Never been tested nor vaccinated for COVID19. Work up in the ED showed a leukocytosis to 10.7K, mild AKI with sCr - 1.05, hyponatremia to 128, normal lactic acid level, and CXR was concerning for large right sided hydropneumothorax vs empyema. She also obtained CT Chest in the ED which showed large loculated air fluid level in the right posterior lung consistent with likely empyema.    Surgery is consulted by emergency medicine physician Dr. Jene Every, MD in this context for evaluation and management of right sided hydropneumothorax.   PAST MEDICAL HISTORY (PMH):  Past Medical History:  Diagnosis Date  . Anxiety   . Asthma   . Collagen vascular disease (HCC)   . COPD (chronic obstructive pulmonary disease) (HCC)   . Hepatitis C   . Hypertension      PAST SURGICAL HISTORY (PSH):  Past Surgical History:  Procedure Laterality Date  . ABDOMINAL HYSTERECTOMY    . FOOT SURGERY       MEDICATIONS:  Prior to Admission medications   Medication Sig Start Date End Date Taking? Authorizing Provider  amitriptyline (ELAVIL) 25 MG tablet Take 1 tablet (25 mg total) by mouth at bedtime as needed for sleep. 12/12/18   Enedina Finner, MD  aspirin 81 MG EC tablet Take 1 tablet (81 mg total) by mouth daily. 12/13/18   Enedina Finner, MD  carvedilol (COREG) 12.5 MG tablet Take 1 tablet (12.5 mg total) by mouth 2  (two) times daily with a meal. 12/12/18   Enedina Finner, MD  lisinopril (ZESTRIL) 10 MG tablet Take 1 tablet (10 mg total) by mouth daily. 12/13/18   Enedina Finner, MD  Multiple Vitamin (MULTIVITAMIN WITH MINERALS) TABS tablet Take 1 tablet by mouth daily. 12/13/18   Enedina Finner, MD     ALLERGIES:  No Known Allergies   SOCIAL HISTORY:  Social History   Socioeconomic History  . Marital status: Married    Spouse name: Not on file  . Number of children: Not on file  . Years of education: Not on file  . Highest education level: Not on file  Occupational History  . Not on file  Tobacco Use  . Smoking status: Current Every Day Smoker    Packs/day: 1.00    Types: Cigarettes  . Smokeless tobacco: Never Used  Substance and Sexual Activity  . Alcohol use: Yes  . Drug use: No  . Sexual activity: Not on file  Other Topics Concern  . Not on file  Social History Narrative  . Not on file   Social Determinants of Health   Financial Resource Strain: Not on file  Food Insecurity: Not on file  Transportation Needs: Not on file  Physical Activity: Not on file  Stress: Not on file  Social Connections: Not on file  Intimate Partner Violence: Not on file     FAMILY HISTORY:  Family History  Problem Relation Age of  Onset  . Rheum arthritis Other   . Osteoporosis Other   . Clotting disorder Other   . Stroke Other       REVIEW OF SYSTEMS:  Review of Systems  Constitutional: Positive for malaise/fatigue and weight loss. Negative for chills and fever.  HENT: Negative for congestion and sore throat.   Respiratory: Positive for cough. Negative for shortness of breath.   Cardiovascular: Positive for chest pain. Negative for palpitations.  Gastrointestinal: Negative for abdominal pain, nausea and vomiting.  Genitourinary: Negative for dysuria and urgency.  All other systems reviewed and are negative.   VITAL SIGNS:  Temp:  [99.6 F (37.6 C)] 99.6 F (37.6 C) (03/02 0639) Pulse Rate:   [94-113] 94 (03/02 0915) Resp:  [16-20] 16 (03/02 0913) BP: (131-148)/(93-119) 148/119 (03/02 0915) SpO2:  [96 %-99 %] 99 % (03/02 0915) Weight:  [45.4 kg] 45.4 kg (03/02 0640)     Height: 5\' 3"  (160 cm) Weight: 45.4 kg BMI (Calculated): 17.72   INTAKE/OUTPUT:  No intake/output data recorded.  PHYSICAL EXAM:  Physical Exam Vitals and nursing note reviewed. Exam conducted with a chaperone present.  Constitutional:      General: She is not in acute distress.    Appearance: Normal appearance. She is well-developed. She is not ill-appearing.     Comments: Thin appearing female, NAD, appears older than stated age  HENT:     Head: Normocephalic and atraumatic.  Eyes:     General: No scleral icterus.    Conjunctiva/sclera: Conjunctivae normal.  Cardiovascular:     Rate and Rhythm: Regular rhythm. Tachycardia present.     Pulses: Normal pulses.     Heart sounds: No murmur heard.   Pulmonary:     Effort: Pulmonary effort is normal. No respiratory distress.     Breath sounds: Wheezing (Mild inspiratory/expiratory ) and rhonchi (RLL) present.  Genitourinary:    Comments: Deferred Musculoskeletal:     Right lower leg: No edema.     Left lower leg: No edema.  Skin:    General: Skin is warm and dry.  Neurological:     General: No focal deficit present.     Mental Status: She is alert and oriented to person, place, and time.  Psychiatric:        Mood and Affect: Mood normal.        Behavior: Behavior normal.      Labs:  CBC Latest Ref Rng & Units 08/31/2020 01/24/2019 12/10/2018  WBC 4.0 - 10.5 K/uL 10.7(H) 6.8 6.1  Hemoglobin 12.0 - 15.0 g/dL 11.1(L) 14.3 14.7  Hematocrit 36.0 - 46.0 % 32.9(L) 42.4 41.8  Platelets 150 - 400 K/uL 463(H) 170 256   CMP Latest Ref Rng & Units 08/31/2020 01/24/2019 12/10/2018  Glucose 70 - 99 mg/dL 02/09/2019) 92 638(V)  BUN 6 - 20 mg/dL 10 564(P) 32(R)  Creatinine 0.44 - 1.00 mg/dL 51(O) 8.41(Y 6.06  Sodium 135 - 145 mmol/L 128(L) 147(H) 140  Potassium 3.5  - 5.1 mmol/L 3.7 3.5 3.5  Chloride 98 - 111 mmol/L 97(L) 115(H) 104  CO2 22 - 32 mmol/L 22 23 21(L)  Calcium 8.9 - 10.3 mg/dL 9.0 3.01) 9.0  Total Protein 6.5 - 8.1 g/dL - 7.7 -  Total Bilirubin 0.3 - 1.2 mg/dL - 0.6 -  Alkaline Phos 38 - 126 U/L - 73 -  AST 15 - 41 U/L - 135(H) -  ALT 0 - 44 U/L - 93(H) -    Imaging studies:   CXR (08/31/2020)  personally reviewed showing large and loculated right sided hydropneumothorax, and radiologist report reviewed: IMPRESSION: Large, loculated right hydropneumothorax.  Mild left basilar pulmonary infiltrate, infection versus aspiration.   CT Chest (08/31/2020) personally reviewed again with large right sided, loculated, hydropneumothorax, likely empyema, and likely chronic interstitial changes bilaterally, and radiologist report reviewed:  IMPRESSION: 1. Large complex posteriorly loculated hydropneumothorax on the right with irregular thick walls, suspicious for empyema. Associated compressive right lower lobe atelectasis. 2. Scattered subpleural reticulation and ground-glass opacity within the aerated portions of the lungs, likely infectious/inflammatory. No consolidation. 3. 6 mm left lower lobe pulmonary nodule, minimally enlarged from 2010, consistent with a benign finding. 4. Aortic Atherosclerosis (ICD10-I70.0) and Emphysema (ICD10-J43.9).   Assessment/Plan: (ICD-10's: J86.9) 60 y.o. female with right sided chest discomfort and SOB found to have large right sided hydropneumothorax, likely empyema, complicated by pertinent comorbidities including HTN, Hep C, COPD, and tobacco abuse.   - Discussed case with Dr Bryn Gulling (Interventional radiology) who is agreeable with placing image guided chest tube; appreciate assistance. Monitor and record output  - We are very limited and still without thoracic surgery services at this facility. We may need to pursue transfer to [facility] in the next 24-48 hours for further evaluation by TCTS and  possibly definitive management. The next steps may be to consider tPA instillation via chest tube; however, my concern is that if any complications were to arise (ie: bleeding) we do not have thoracic surgery available to intervene. Additionally, I suspect she has significant rind present based on imaging and may ultimately need decortication    - NPO for now; okay for diet after IR procedure   - IVF Support; monitor renal function  - Agree IV ABx  - Pulmonary toilet; IS  - Mobilization as tolerated  - Pain control prn  - Further management per primary service   All of the above findings and recommendations were discussed with the patient (and her husband via telephone), and all of their questions were answered to their expressed satisfaction.  Thank you for the opportunity to participate in this patient's care.   -- Lynden Oxford, PA-C [facility] 08/31/2020, 9:58 AM [phone] M-F: 7am - 4pm

## 2020-08-31 NOTE — Progress Notes (Signed)
CODE SEPSIS - PHARMACY COMMUNICATION  **Broad Spectrum Antibiotics should be administered within 1 hour of Sepsis diagnosis**  Time Code Sepsis Called/Page Received: 0719  Antibiotics Ordered: Vancomycin, Cefepime, Azithromycin  Time of 1st antibiotic administration: 0858  Additional action taken by pharmacy: called nurse, was in the room hanging Cefepime     Albina Billet ,PharmD Clinical Pharmacist  08/31/2020  7:24 AM

## 2020-08-31 NOTE — Procedures (Signed)
Interventional Radiology Procedure Note  Procedure: Right chest tube  Indication: Right sided empyema  Findings:  10.2 fr pigtail drain placed in right empyema.  Please refer to procedural dictation for full description.  Complications: None  EBL: < 10 mL  Acquanetta Belling, MD [phone]

## 2020-08-31 NOTE — ED Notes (Signed)
Diet tray provided

## 2020-08-31 NOTE — ED Notes (Signed)
Brought pt to room 221 that was marked as ready on ED trackboard. Arrived with pt and room was not clean. Had to bring pt back to room 33 in ED. CN informed. CN looked on trackboard and also saw that room was marked as ready. CN will follow up appropriately.

## 2020-08-31 NOTE — ED Notes (Signed)
Pt walked to hall bathroom to void. Steady gait noted. Pt in bed now. Blanket provided.

## 2020-08-31 NOTE — ED Notes (Addendum)
Jill Side, RN called from specials recovery. She will bring pt back to room 33 soon and will help pt recover until about 1500. Pt has chest tube placed with about 300 mL milky purulent fluid from pleural space evacuated so far. Jill Side will document overdue Nacl infusion and cabinet overrides which were for procedure.

## 2020-08-31 NOTE — Consult Note (Signed)
PHARMACY -  BRIEF ANTIBIOTIC NOTE   Pharmacy has received consult(s) for Vancomycin/Cefepime from an ED provider.  The patient's profile has been reviewed for ht/wt/allergies/indication/available labs.    One time order(s) placed for Cefepime 2g IV x 1, Vancomycin 1000mg  IV x 1(this is a full loading dose)  Further antibiotics/pharmacy consults should be ordered by admitting physician if indicated.                       Thank you,  , PharmD, BCPS Clinical Pharmacist 08/31/2020 7:24 AM

## 2020-08-31 NOTE — ED Notes (Signed)
Pt awake and alert; GCS 15.  Reports breathing improvement since chest tube placed -- RR even and unlabored on RA at this time; O2 sats 98%.  Pt does report R upper back and R shoulder pain -- states this pain has been ongoing since being diagnosed with PNA -- also c/o intermittent nonprod cough.  Abdomen soft nontender.  Skin warm dry and intact with exception of chest tube insertion site (dressing dry and intact).  Denies any immediate needs, questions, concerns-- will d/c to admission floor when room is ready

## 2020-08-31 NOTE — H&P (Signed)
Chief Complaint: Patient was seen in consultation today for  Chief Complaint  Patient presents with  . Cough  . Shortness of Breath   Patient Status: [facility] - ED  History of Present Illness: Nicole [name] is a 60 y.o. female who presented to the ED with SOB which has been worsening over the past month.  Her SOB is accompanied with fever, cough, fatigue, and right chest pain.  CT of the chest showed large right hydropneumothoax, likely empyema.  IR consulted for chest tube placement.  Past Medical History:  Diagnosis Date  . Anxiety   . Asthma   . Collagen vascular disease (HCC)   . COPD (chronic obstructive pulmonary disease) (HCC)   . Hepatitis C   . Hypertension     Past Surgical History:  Procedure Laterality Date  . ABDOMINAL HYSTERECTOMY    . FOOT SURGERY      Allergies: Patient has no known allergies.  Medications: Prior to Admission medications   Medication Sig Start Date End Date Taking? Authorizing Provider  amitriptyline (ELAVIL) 25 MG tablet Take 1 tablet (25 mg total) by mouth at bedtime as needed for sleep. Patient not taking: Reported on 08/31/2020 12/12/18   Enedina Finner, MD  aspirin 81 MG EC tablet Take 1 tablet (81 mg total) by mouth daily. Patient not taking: Reported on 08/31/2020 12/13/18   Enedina Finner, MD  carvedilol (COREG) 12.5 MG tablet Take 1 tablet (12.5 mg total) by mouth 2 (two) times daily with a meal. Patient not taking: Reported on 08/31/2020 12/12/18   Enedina Finner, MD  lisinopril (ZESTRIL) 10 MG tablet Take 1 tablet (10 mg total) by mouth daily. Patient not taking: Reported on 08/31/2020 12/13/18   Enedina Finner, MD  Multiple Vitamin (MULTIVITAMIN WITH MINERALS) TABS tablet Take 1 tablet by mouth daily. Patient not taking: Reported on 08/31/2020 12/13/18   Enedina Finner, MD     Family History  Problem Relation Age of Onset  . Rheum arthritis Other   . Osteoporosis Other   . Clotting disorder Other   . Stroke Other     Social History    Socioeconomic History  . Marital status: Married    Spouse name: Not on file  . Number of children: Not on file  . Years of education: Not on file  . Highest education level: Not on file  Occupational History  . Not on file  Tobacco Use  . Smoking status: Current Every Day Smoker    Packs/day: 1.00    Types: Cigarettes  . Smokeless tobacco: Never Used  Substance and Sexual Activity  . Alcohol use: Yes  . Drug use: No  . Sexual activity: Not on file  Other Topics Concern  . Not on file  Social History Narrative  . Not on file   Social Determinants of Health   Financial Resource Strain: Not on file  Food Insecurity: Not on file  Transportation Needs: Not on file  Physical Activity: Not on file  Stress: Not on file  Social Connections: Not on file    Review of Systems: A 12 point ROS discussed and pertinent positives are indicated in the HPI above.  All other systems are negative.  Review of Systems  Vital Signs: BP 121/83   Pulse (!) 103   Temp 98.1 F (36.7 C) (Oral)   Resp 20   Ht 5\' 3"  (1.6 m)   Wt 45.4 kg   SpO2 94%   BMI 17.71 kg/m   Physical Exam  HENT:     Mouth/Throat:     Mouth: Mucous membranes are moist.     Pharynx: Oropharynx is clear.  Cardiovascular:     Rate and Rhythm: Normal rate and regular rhythm.  Pulmonary:     Breath sounds: Examination of the right-lower field reveals decreased breath sounds. Decreased breath sounds present.     Comments: Decreased breath sounds at right lung base. Abdominal:     Palpations: Abdomen is soft.  Musculoskeletal:     Cervical back: Normal range of motion.  Skin:    General: Skin is warm and dry.  Neurological:     Mental Status: She is alert.     Imaging: DG Chest 2 View  Result Date: 08/31/2020 CLINICAL DATA:  Dyspnea, right rib pain EXAM: CHEST - 2 VIEW COMPARISON:  12/10/2018 FINDINGS: Large right posteriorly loculated hydropneumothorax has developed. Small free-flowing right pleural fluid  component also noted. Mild left basilar pulmonary infiltrate is present, infection versus aspiration. No pneumothorax or pleural effusion on the left. Cardiac size within normal limits. No acute bone abnormality. IMPRESSION: Large, loculated right hydropneumothorax. Mild left basilar pulmonary infiltrate, infection versus aspiration. Electronically Signed   By: Helyn Numbers MD   On: 08/31/2020 07:12   CT Chest W Contrast  Addendum Date: 08/31/2020   ADDENDUM REPORT: 08/31/2020 10:26 ADDENDUM: Excluded from the impression is the presence of a 4.2 cm ascending aortic aneurysm. Recommend annual imaging followup by CTA or MRA. This recommendation follows 2010 ACCF/AHA/AATS/ACR/ASA/SCA/SCAI/SIR/STS/SVM Guidelines for the Diagnosis and Management of Patients with Thoracic Aortic Disease. Circulation. 2010; 121: J628-Z662 Electronically Signed   By: Carey Bullocks M.D.   On: 08/31/2020 10:26   Result Date: 08/31/2020 CLINICAL DATA:  Shortness of breath and right shoulder pain. Complex right-sided hydropneumothorax on radiographs, pneumonia or abscess suspected. EXAM: CT CHEST WITH CONTRAST TECHNIQUE: Multidetector CT imaging of the chest was performed during intravenous contrast administration. CONTRAST:  55mL OMNIPAQUE IOHEXOL 300 MG/ML  SOLN COMPARISON:  CT 08/21/2008.  Radiographs today and 12/10/2018. FINDINGS: Cardiovascular: Atherosclerosis of the aorta, great vessels and coronary arteries. There is dilatation of the ascending aorta to 4.2 cm. No evidence of acute pulmonary embolism. The heart size is normal. There is no pericardial effusion. Mediastinum/Nodes: There are no enlarged mediastinal, hilar or axillary lymph nodes.Small mediastinal and right hilar lymph nodes are likely reactive. The thyroid gland, trachea and esophagus demonstrate no significant findings. Lungs/Pleura: As demonstrated radiographically, there is a large complex posteriorly loculated hydropneumothorax on the right. Air-fluid level  posteriorly in the pleural space measures 17.5 x 8.3 x 9.2 cm and has irregular thick walls, suspicious for empyema. There is compressive right lower lobe atelectasis, but no evidence of endobronchial lesion. There is scattered subpleural reticulation and ground-glass opacity within the aerated portions of the lungs. There is mild tree-in-bud nodularity in the right middle lobe. In the left lower lobe, there is a solid pulmonary nodule measuring 6 mm on image 100/3 (previously 5 mm). Mild underlying emphysema. Upper abdomen:  The visualized upper abdomen appears unremarkable. Musculoskeletal/Chest wall: There is no chest wall mass or suspicious osseous finding. Degenerative changes in the lumbar spine with rightward listhesis of L1 relative to L2. IMPRESSION: 1. Large complex posteriorly loculated hydropneumothorax on the right with irregular thick walls, suspicious for empyema. Associated compressive right lower lobe atelectasis. 2. Scattered subpleural reticulation and ground-glass opacity within the aerated portions of the lungs, likely infectious/inflammatory. No consolidation. 3. 6 mm left lower lobe pulmonary nodule, minimally enlarged from 2010,  consistent with a benign finding. 4. Aortic Atherosclerosis (ICD10-I70.0) and Emphysema (ICD10-J43.9). Electronically Signed: By: Carey Bullocks M.D. On: 08/31/2020 10:03    Labs:  CBC: Recent Labs    08/31/20 0643  WBC 10.7*  HGB 11.1*  HCT 32.9*  PLT 463*    COAGS: Recent Labs    08/31/20 0847  INR 1.2  APTT 41*    BMP: Recent Labs    08/31/20 0643  NA 128*  K 3.7  CL 97*  CO2 22  GLUCOSE 119*  BUN 10  CALCIUM 9.0  CREATININE 1.05*  GFRNONAA >60   Assessment and Plan:  60 year old woman with fever, cough, right chest pain, and SOB was found to have a large right hydropneumothorax on CT, suspicious for an empyema.   IR consulted for chest tube placement.  Risks and benefits of chest tube placement were discussed with the  patient including bleeding, infection, damage to adjacent structures, malfunction of the tube requiring additional procedures and sepsis.  All of the patient's questions were answered, patient is agreeable to proceed.  Consent signed and in chart.  Thank you for this interesting consult.  I greatly enjoyed meeting [facility] and look forward to participating in their care.  A copy of this report was sent to the requesting provider on this date.  Electronically Signed: Al Corpus Makaria Poarch, MD 08/31/2020, 3:20 PM   I spent a total of 20 Minutes    in face to face in clinical consultation, greater than 50% of which was counseling/coordinating care for right chest tube placement.

## 2020-08-31 NOTE — ED Notes (Signed)
Pt back in room 33 of ED. Messaged Dr Joylene Igo to request PRN pain med other than tylenol. Pt in NAD but rates pain as 8/10 R side,shoulder and back. Chest tube output will be documented at end of shift and level marked on drainage unit.

## 2020-08-31 NOTE — ED Notes (Signed)
Sr Agbata at bedside.

## 2020-08-31 NOTE — ED Notes (Signed)
Pt to CT

## 2020-08-31 NOTE — H&P (Signed)
History and Physical    Nicole [name] XBW:[id number] DOB: [birth year] DOA: 08/31/2020  [facility]P: [facility]   Patient coming from: Home  I have personally briefly reviewed patient's old medical records in [facility]  Chief Complaint: Shortness of breath/cough  HPI: Nicole [name] is a 60 y.o. female with medical history significant for hepatitis C, collagen vascular disease, COPD, nicotine dependence and hypertension who presents to the ER for evaluation of worsening shortness of breath.  Patient reports that she has been sick for about 6 weeks and states that her symptoms initially started with what was assumed to be an upper respiratory tract infection and then progressed into shortness of breath and cough productive of yellow/green phlegm.  Her son was also sick at the time but none of them were tested for the COVID-19 virus.  She is unvaccinated against the COVID-19 virus. She remains short of breath with any form of exertion and continues to have a cough productive of yellowish-green phlegm associated with weakness, chills and fatigue.  She also now has chest pain mostly in the right lateral chest wall worse with inspiration and states that it feels similar to when she had pneumonia in the past.  She has had associated night sweats and unquantified weight loss but denies having any sick contacts.  She also complains of anorexia. She denies having any palpitations, no nausea, no lower extremity swelling, no vomiting, no abdominal pain, no diarrhea, no constipation, no dizziness, no lightheadedness, no headache, no nocturia, no dysuria, no hematuria. Labs show sodium 128, potassium 3.7, chloride 97, bicarb 22, glucose 119, BUN 10, creatinine 1.05, calcium 9.0, troponin VI, lactic acid 0.9, white count 10.7, hemoglobin 11.1, hematocrit 32.9, MCV 95.1, RDW 15.4, platelet count 463, PT 14.4, INR 1.2, Respiratory viral panel is negative Urinalysis shows some  pyuria with positive nitrites and moderate leukocyte esterase Chest x-ray reviewed by me shows large, loculated right hydropneumothorax. Mild left basilar pulmonary infiltrate, infection versus aspiration. CT scan of the chest with contrast shows large complex posteriorly loculated hydropneumothorax on the right with irregular thick walls, suspicious for empyema. Associated compressive right lower lobe atelectasis. Scattered subpleural reticulation and ground-glass opacity withinthe aerated portions of the lungs, likely infectious/inflammatory. No consolidation. 6 mm left lower lobe pulmonary nodule, minimally enlarged from 2010, consistent with a benign finding. Aortic Atherosclerosis and Emphysema. 4.2 cm ascending aortic aneurysm. Recommend annual imaging followup by CTA or MRA. Twelve-lead EKG reviewed by me shows sinus tachycardia   ED Course: Patient is a 60 year old Caucasian female who presents to the ER for evaluation of worsening shortness of breath, productive cough associated with right-sided pleuritic chest pain and chills.  She is noted to have a right hydropneumothorax on imaging and will be admitted to the hospital for further evaluation.     Review of Systems: As per HPI otherwise all other systems reviewed and negative.    Past Medical History:  Diagnosis Date  . Anxiety   . Asthma   . Collagen vascular disease (HCC)   . COPD (chronic obstructive pulmonary disease) (HCC)   . Hepatitis C   . Hypertension     Past Surgical History:  Procedure Laterality Date  . ABDOMINAL HYSTERECTOMY    . FOOT SURGERY       reports that she has been smoking cigarettes. She has been smoking about 1.00 pack per day. She has never used smokeless tobacco. She reports current alcohol use. She reports that she does not use  drugs.  No Known Allergies  Family History  Problem Relation Age of Onset  . Rheum arthritis Other   . Osteoporosis Other   . Clotting disorder Other   . Stroke  Other       Prior to Admission medications   Medication Sig Start Date End Date Taking? Authorizing Provider  amitriptyline (ELAVIL) 25 MG tablet Take 1 tablet (25 mg total) by mouth at bedtime as needed for sleep. Patient not taking: Reported on 08/31/2020 12/12/18   Enedina Finner, MD  aspirin 81 MG EC tablet Take 1 tablet (81 mg total) by mouth daily. Patient not taking: Reported on 08/31/2020 12/13/18   Enedina Finner, MD  carvedilol (COREG) 12.5 MG tablet Take 1 tablet (12.5 mg total) by mouth 2 (two) times daily with a meal. Patient not taking: Reported on 08/31/2020 12/12/18   Enedina Finner, MD  lisinopril (ZESTRIL) 10 MG tablet Take 1 tablet (10 mg total) by mouth daily. Patient not taking: Reported on 08/31/2020 12/13/18   Enedina Finner, MD  Multiple Vitamin (MULTIVITAMIN WITH MINERALS) TABS tablet Take 1 tablet by mouth daily. Patient not taking: Reported on 08/31/2020 12/13/18   Enedina Finner, MD    Physical Exam: Vitals:   08/31/20 1122 08/31/20 1130 08/31/20 1230 08/31/20 1325  BP:  (!) 145/92 (!) 132/119 (!) 135/99  Pulse:  86 82 91  Resp:  19 20 16   Temp: 98.1 F (36.7 C)     TempSrc: Oral     SpO2:  98% 97% 96%  Weight:      Height:         Vitals:   08/31/20 1122 08/31/20 1130 08/31/20 1230 08/31/20 1325  BP:  (!) 145/92 (!) 132/119 (!) 135/99  Pulse:  86 82 91  Resp:  19 20 16   Temp: 98.1 F (36.7 C)     TempSrc: Oral     SpO2:  98% 97% 96%  Weight:      Height:          Constitutional: Alert and oriented x 3 . Not in any apparent distress HEENT:      Head: Normocephalic and atraumatic.         Eyes: PERLA, EOMI, Conjunctivae are normal. Sclera is non-icteric.       Mouth/Throat: Mucous membranes are moist.       Neck: Supple with no signs of meningismus. Cardiovascular: Regular rate and rhythm. No murmurs, gallops, or rubs. 2+ symmetrical distal pulses are present . No JVD. No LE edema Respiratory:  Bilateral air entry.decreased air entry over the right middle to lower  lung zones, scattered rhonchi and wheezes, crackles, or rhonchi.  Gastrointestinal: Soft, non tender, and non distended with positive bowel sounds.  Genitourinary: No CVA tenderness. Musculoskeletal: Nontender with normal range of motion in all extremities. No cyanosis, or erythema of extremities. Neurologic:  Face is symmetric. Moving all extremities. No gross focal neurologic deficits  Skin: Skin is warm, dry.  No rash or ulcers Psychiatric: Mood and affect are normal   Labs on Admission: I have personally reviewed following labs and imaging studies  CBC: Recent Labs  Lab 08/31/20 0643  WBC 10.7*  HGB 11.1*  HCT 32.9*  MCV 95.1  PLT 463*   Basic Metabolic Panel: Recent Labs  Lab 08/31/20 0643  NA 128*  K 3.7  CL 97*  CO2 22  GLUCOSE 119*  BUN 10  CREATININE 1.05*  CALCIUM 9.0   GFR: Estimated Creatinine Clearance: 42.4 mL/min (A) (by C-G  formula based on SCr of 1.05 mg/dL (H)). Liver Function Tests: No results for input(s): AST, ALT, ALKPHOS, BILITOT, PROT, ALBUMIN in the last 168 hours. No results for input(s): LIPASE, AMYLASE in the last 168 hours. No results for input(s): AMMONIA in the last 168 hours. Coagulation Profile: Recent Labs  Lab 08/31/20 0847  INR 1.2   Cardiac Enzymes: No results for input(s): CKTOTAL, CKMB, CKMBINDEX, TROPONINI in the last 168 hours. BNP (last 3 results) No results for input(s): PROBNP in the last 8760 hours. HbA1C: No results for input(s): HGBA1C in the last 72 hours. CBG: No results for input(s): GLUCAP in the last 168 hours. Lipid Profile: No results for input(s): CHOL, HDL, LDLCALC, TRIG, CHOLHDL, LDLDIRECT in the last 72 hours. Thyroid Function Tests: No results for input(s): TSH, T4TOTAL, FREET4, T3FREE, THYROIDAB in the last 72 hours. Anemia Panel: No results for input(s): VITAMINB12, FOLATE, FERRITIN, TIBC, IRON, RETICCTPCT in the last 72 hours. Urine analysis:    Component Value Date/Time   COLORURINE YELLOW  (A) 08/31/2020 0847   APPEARANCEUR HAZY (A) 08/31/2020 0847   LABSPEC 1.021 08/31/2020 0847   PHURINE 6.0 08/31/2020 0847   GLUCOSEU NEGATIVE 08/31/2020 0847   HGBUR NEGATIVE 08/31/2020 0847   BILIRUBINUR NEGATIVE 08/31/2020 0847   KETONESUR NEGATIVE 08/31/2020 0847   PROTEINUR NEGATIVE 08/31/2020 0847   NITRITE POSITIVE (A) 08/31/2020 0847   LEUKOCYTESUR MODERATE (A) 08/31/2020 0847    Radiological Exams on Admission: DG Chest 2 View  Result Date: 08/31/2020 CLINICAL DATA:  Dyspnea, right rib pain EXAM: CHEST - 2 VIEW COMPARISON:  12/10/2018 FINDINGS: Large right posteriorly loculated hydropneumothorax has developed. Small free-flowing right pleural fluid component also noted. Mild left basilar pulmonary infiltrate is present, infection versus aspiration. No pneumothorax or pleural effusion on the left. Cardiac size within normal limits. No acute bone abnormality. IMPRESSION: Large, loculated right hydropneumothorax. Mild left basilar pulmonary infiltrate, infection versus aspiration. Electronically Signed   By: Helyn Numbers MD   On: 08/31/2020 07:12   CT Chest W Contrast  Addendum Date: 08/31/2020   ADDENDUM REPORT: 08/31/2020 10:26 ADDENDUM: Excluded from the impression is the presence of a 4.2 cm ascending aortic aneurysm. Recommend annual imaging followup by CTA or MRA. This recommendation follows 2010 ACCF/AHA/AATS/ACR/ASA/SCA/SCAI/SIR/STS/SVM Guidelines for the Diagnosis and Management of Patients with Thoracic Aortic Disease. Circulation. 2010; 121: [id number] Electronically Signed   By: Carey Bullocks M.D.   On: 08/31/2020 10:26   Result Date: 08/31/2020 CLINICAL DATA:  Shortness of breath and right shoulder pain. Complex right-sided hydropneumothorax on radiographs, pneumonia or abscess suspected. EXAM: CT CHEST WITH CONTRAST TECHNIQUE: Multidetector CT imaging of the chest was performed during intravenous contrast administration. CONTRAST:  24mL OMNIPAQUE IOHEXOL 300 MG/ML  SOLN  COMPARISON:  CT 08/21/2008.  Radiographs today and 12/10/2018. FINDINGS: Cardiovascular: Atherosclerosis of the aorta, great vessels and coronary arteries. There is dilatation of the ascending aorta to 4.2 cm. No evidence of acute pulmonary embolism. The heart size is normal. There is no pericardial effusion. Mediastinum/Nodes: There are no enlarged mediastinal, hilar or axillary lymph nodes.Small mediastinal and right hilar lymph nodes are likely reactive. The thyroid gland, trachea and esophagus demonstrate no significant findings. Lungs/Pleura: As demonstrated radiographically, there is a large complex posteriorly loculated hydropneumothorax on the right. Air-fluid level posteriorly in the pleural space measures 17.5 x 8.3 x 9.2 cm and has irregular thick walls, suspicious for empyema. There is compressive right lower lobe atelectasis, but no evidence of endobronchial lesion. There is scattered subpleural reticulation and ground-glass  opacity within the aerated portions of the lungs. There is mild tree-in-bud nodularity in the right middle lobe. In the left lower lobe, there is a solid pulmonary nodule measuring 6 mm on image 100/3 (previously 5 mm). Mild underlying emphysema. Upper abdomen:  The visualized upper abdomen appears unremarkable. Musculoskeletal/Chest wall: There is no chest wall mass or suspicious osseous finding. Degenerative changes in the lumbar spine with rightward listhesis of L1 relative to L2. IMPRESSION: 1. Large complex posteriorly loculated hydropneumothorax on the right with irregular thick walls, suspicious for empyema. Associated compressive right lower lobe atelectasis. 2. Scattered subpleural reticulation and ground-glass opacity within the aerated portions of the lungs, likely infectious/inflammatory. No consolidation. 3. 6 mm left lower lobe pulmonary nodule, minimally enlarged from 2010, consistent with a benign finding. 4. Aortic Atherosclerosis (ICD10-I70.0) and Emphysema  (ICD10-J43.9). Electronically Signed: By: Carey BullocksWilliam  Veazey M.D. On: 08/31/2020 10:03     Assessment/Plan Principal Problem:   Pleural effusion on right Active Problems:   Hypertension   Collagen vascular disease (HCC)   Hyponatremia   Acute lower UTI   Nicotine dependence     Right-sided hydropneumothorax Unclear etiology ??  Possible empyema CT surgery has been consulted for chest tube insertion We will send fluid for standard fluid analysis, cytology, Gram stain and cultures    Community-acquired pneumonia Continue empiric antibiotic therapy with Rocephin and Zithromax   UTI Patient with significant pyuria We will treat empirically with Rocephin daily until urine culture results become available   Nicotine dependence Smoking cessation has been discussed with patient in detail We will place patient on a nicotine transdermal patch 14 mg daily   Hyponatremia Most likely hypovolemic hyponatremia Gentle IV fluid hydration Repeat sodium levels in a.m.  DVT prophylaxis: Lovenox Code Status: full code Family Communication: Greater than 50% of time was spent discussing plan of care with patient at the bedside.  All questions and concerns have been addressed.  She verbalizes understanding and agrees with the plan. Disposition Plan: Back to previous home environment Consults called: none Status: Inpatient.  The medical decision making for this patient was of high complexity and patient is at high risk for clinical deterioration during this hospitalization.    Lucile Shuttersochukwu Braxston Quinter MD [facility]     08/31/2020, 1:28 PM

## 2020-08-31 NOTE — ED Triage Notes (Signed)
BIB [facility] from home due to cough, green productive sputum and SOB ongoing X 1 month. Pt with hx of COPD but reports increase in SOB above baseline. Able to speak in clear and complete sentences without any noticed distress. Pt alert and oriented X4, cooperative, RR even and unlabored, color WNL. Pt in NAD.

## 2020-09-01 ENCOUNTER — Inpatient Hospital Stay: Payer: Self-pay

## 2020-09-01 LAB — BASIC METABOLIC PANEL
Anion gap: 6 (ref 5–15)
BUN: 13 mg/dL (ref 6–20)
CO2: 21 mmol/L — ABNORMAL LOW (ref 22–32)
Calcium: 8.7 mg/dL — ABNORMAL LOW (ref 8.9–10.3)
Chloride: 106 mmol/L (ref 98–111)
Creatinine, Ser: 1.09 mg/dL — ABNORMAL HIGH (ref 0.44–1.00)
GFR, Estimated: 59 mL/min — ABNORMAL LOW (ref >60)
Glucose, Bld: 125 mg/dL — ABNORMAL HIGH (ref 70–99)
Potassium: 3.7 mmol/L (ref 3.5–5.1)
Sodium: 133 mmol/L — ABNORMAL LOW (ref 135–145)

## 2020-09-01 LAB — CBC
HCT: 31.3 % — ABNORMAL LOW (ref 36.0–46.0)
Hemoglobin: 10.4 g/dL — ABNORMAL LOW (ref 12.0–15.0)
MCH: 31.9 pg (ref 26.0–34.0)
MCHC: 33.2 g/dL (ref 30.0–36.0)
MCV: 96 fL (ref 80.0–100.0)
Platelets: 372 10*3/uL (ref 150–400)
RBC: 3.26 MIL/uL — ABNORMAL LOW (ref 3.87–5.11)
RDW: 15.7 % — ABNORMAL HIGH (ref 11.5–15.5)
WBC: 5.9 10*3/uL (ref 4.0–10.5)
nRBC: 0 % (ref 0.0–0.2)

## 2020-09-01 LAB — HIV ANTIBODY (ROUTINE TESTING W REFLEX): HIV Screen 4th Generation wRfx: NONREACTIVE

## 2020-09-01 MED ORDER — SODIUM CHLORIDE 0.9 % IV SOLN: 500.0000 mg | INTRAVENOUS | Status: DC

## 2020-09-01 MED ORDER — HYDROCODONE-ACETAMINOPHEN 5-325 MG PO TABS
1.0000 | ORAL_TABLET | ORAL | Status: DC | PRN
Start: 2020-09-01 — End: 2020-09-06
  Administered 2020-09-01 – 2020-09-06 (×28): 1 via ORAL
  Filled 2020-09-01 (×28): qty 1

## 2020-09-01 MED ORDER — SODIUM CHLORIDE 0.9 % IV SOLN
3.0000 g | Freq: Four times a day (QID) | INTRAVENOUS | Status: DC
  Administered 2020-09-01 – 2020-09-05 (×16): 3 g via INTRAVENOUS
  Filled 2020-09-01: qty 8
  Filled 2020-09-01 (×2): qty 3
  Filled 2020-09-01 (×3): qty 8
  Filled 2020-09-01 (×2): qty 3
  Filled 2020-09-01: qty 8
  Filled 2020-09-01: qty 3
  Filled 2020-09-01: qty 8
  Filled 2020-09-01 (×4): qty 3
  Filled 2020-09-01 (×2): qty 8
  Filled 2020-09-01: qty 3
  Filled 2020-09-01: qty 8
  Filled 2020-09-01: qty 3
  Filled 2020-09-01: qty 8
  Filled 2020-09-01: qty 3

## 2020-09-01 MED ORDER — ENSURE ENLIVE PO LIQD
237.0000 mL | Freq: Two times a day (BID) | ORAL | Status: DC
  Administered 2020-09-02 – 2020-09-06 (×10): 237 mL via ORAL

## 2020-09-01 NOTE — TOC Initial Note (Signed)
Transition of Care [facility]) - Initial/Assessment Note    Patient Details  Name: Nicole [name] MRN: [id number] Date of Birth: 28-Apr-1961  Transition of Care [facility]) CM/SW Contact:    Chapman Fitch, RN Phone Number: 09/01/2020, 9:50 AM  Clinical Narrative:                 Patient admitted from home with pleural effusion.  Patient states she lives at home with husband and 3 sons  PCP Phineas Real - Patient states "I haven't been in a while.  I haven't needed too.  Im not going to wait to go this time"  Patient states that she has not filled her prescriptions in a year.  Patient states that her Medicaid is family planning and will cover yearly exams and mammograms.  Patient provided application to [facility] '  Pending transfer to [facility].    Expected Discharge Plan: Acute to Acute Transfer     Patient Goals and CMS Choice        Expected Discharge Plan and Services Expected Discharge Plan: Acute to Acute Transfer   Discharge Planning Services: CM Consult,Indigent Health Clinic,Medication Assistance                                          Prior Living Arrangements/Services   Lives with:: Adult Children,Spouse Patient language and need for interpreter reviewed:: Yes Do you feel safe going back to the place where you live?: Yes      Need for Family Participation in Patient Care: Yes (Comment) Care giver support system in place?: Yes (comment)   Criminal Activity/Legal Involvement Pertinent to Current Situation/Hospitalization: No - Comment as needed  Activities of Daily Living Home Assistive Devices/Equipment: None ADL Screening (condition at time of admission) Patient's cognitive ability adequate to safely complete daily activities?: Yes Is the patient deaf or have difficulty hearing?: No Does the patient have difficulty seeing, even when wearing glasses/contacts?: No Does the patient have difficulty concentrating,  remembering, or making decisions?: No Patient able to express need for assistance with ADLs?: Yes Does the patient have difficulty dressing or bathing?: No Independently performs ADLs?: Yes (appropriate for developmental age) Does the patient have difficulty walking or climbing stairs?: No Weakness of Legs: None Weakness of Arms/Hands: None  Permission Sought/Granted                  Emotional Assessment       Orientation: : Oriented to Self,Oriented to Place,Oriented to  Time,Oriented to Situation Alcohol / Substance Use: Not Applicable Psych Involvement: No (comment)  Admission diagnosis:  Shortness of breath [R06.02] Pleural effusion on right [J90] Empyema, right (HCC) [J86.9] Empyema (HCC) [J86.9] Status post chest tube placement [facility]) [Z93.8] Patient Active Problem List   Diagnosis Date Noted  . Pleural effusion on right 08/31/2020  . Collagen vascular disease (HCC)   . Hyponatremia   . Acute lower UTI   . Nicotine dependence   . Hypertension 12/10/2018  . Alcohol abuse 10/22/2016  . 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 Cocaine abuse (HCC) 10/22/2016  . Cellulitis and abscess 10/06/2015  . Sepsis (HCC) 10/06/2015  . Anxiety state 10/06/2015  . Asthma 10/06/2015  . Cellulitis    PCP:  Center, [facility]ineas Real [facility] Pharmacy:   [facility] #[id number] Nicholes Rough, [place] - [address] AT  [address] [place] [address] Phone: [phone] Fax: [phone]  [facility] [address] - [place], [place] - [address] [place] [address] Phone: [phone] Fax: [phone]  [facility] #[id number] Cheree Ditto, [place] - [address] AT [facility] OF [address] [place] [address] [place] [address] Phone: [phone] Fax: [phone]     Social Determinants of Health (SDOH) Interventions    Readmission Risk Interventions No flowsheet data found.

## 2020-09-01 NOTE — Progress Notes (Signed)
Initial Nutrition Assessment  DOCUMENTATION CODES:   Severe malnutrition in context of chronic illness  INTERVENTION:   Ensure Enlive po BID, each supplement provides 350 kcal and 20 grams of protein  Magic cup TID with meals, each supplement provides 290 kcal and 9 grams of protein  MVI po daily   Pt at high refeed risk; recommend monitor potassium, magnesium and phosphorus labs daily until stable  NUTRITION DIAGNOSIS:   Severe Malnutrition related to catabolic illness (COPD) as evidenced by severe fat depletion,severe muscle depletion.  GOAL:   Patient will meet greater than or equal to 90% of their needs  MONITOR:   PO intake,Supplement acceptance,Labs,Weight trends,Skin,I & O's  REASON FOR ASSESSMENT:   Malnutrition Screening Tool    ASSESSMENT:   59 y.o. female with large right sided hydropneumothorax, likely empyema s/p chest tube placement on 03/02, complicated by pertinent comorbidities including HTN, Hep C, COPD and tobacco abuse.   Met with pt in room today. Pt reports poor appetite and oral intake 2-3 days pta. Pt reports that her appetite has improved in hospital; pt eating 100% of meals. Pt reports that she does not drink supplements regularly at home but that she has drank them before and likes them; pt is willing to drink vanilla or chocolate Ensure in hospital. Per chart, pt is down 37lbs over the past several years. Pt reports that recently, she weighed 110lbs at an office visit several weeks ago. Pt documented to weigh 97lbs in hospital currently. There are no recent documented weights in chart to determine if any recent significant weight changes. RD will add supplements and MVI to help pt meet her estimated needs. Pt is at high refeed risk.   Medications reviewed and include: lovenox, MVI, nicotine, NaCl @100ml/hr, unasyn  Labs reviewed: Na 133(L), creat 1.09(H) Hgb 10.4(L), Hct 31.3(L)  NUTRITION - FOCUSED PHYSICAL EXAM:  Flowsheet Row Most Recent  Value  Orbital Region Severe depletion  Upper Arm Region Severe depletion  Thoracic and Lumbar Region Severe depletion  Buccal Region Severe depletion  Temple Region Severe depletion  Clavicle Bone Region Severe depletion  Clavicle and Acromion Bone Region Severe depletion  Scapular Bone Region Severe depletion  Dorsal Hand Severe depletion  Patellar Region Severe depletion  Anterior Thigh Region Severe depletion  Posterior Calf Region Severe depletion  Edema (RD Assessment) None  Hair Reviewed  Eyes Reviewed  Mouth Reviewed  Skin Reviewed  Nails Reviewed     Diet Order:   Diet Order            Diet 2 gram sodium Room service appropriate? Yes; Fluid consistency: Thin  Diet effective now                EDUCATION NEEDS:   Education needs have been addressed  Skin:  Skin Assessment: Reviewed RN Assessment  Last BM:  2/28  Height:   Ht Readings from Last 1 Encounters:  08/31/20 5' 3" (1.6 m)    Weight:   Wt Readings from Last 1 Encounters:  08/31/20 44.1 kg    Ideal Body Weight:  52.3 kg  BMI:  Body mass index is 17.22 kg/m.  Estimated Nutritional Needs:   Kcal:  1400-1600kcal/day  Protein:  70-80g/day  Fluid:  1.4-1.6L/day  Casey Campbell MS, RD, LDN Please refer to AMION for RD and/or RD on-call/weekend/after hours pager  

## 2020-09-01 NOTE — Plan of Care (Signed)
  Problem: Clinical Measurements: Goal: Ability to maintain clinical measurements within normal limits will improve Outcome: Progressing Goal: Respiratory complications will improve Outcome: Progressing   Problem: Activity: Goal: Risk for activity intolerance will decrease Outcome: Progressing   Problem: Pain Managment: Goal: General experience of comfort will improve Outcome: Not Progressing  Pain worse with activity and cough   Problem: Safety: Goal: Ability to remain free from injury will improve Outcome: Progressing

## 2020-09-01 NOTE — Progress Notes (Signed)
     [address]       [place] [address]             [phone]       Images reviewed. This is a 60 year old female that presents to [facility] with a complaint of shortness of breath and was noted to have a large loculated right-sided hydropneumothorax concerning for empyema.  She underwent pigtail catheter placement, and on postprocedure chest x-ray there appears to be a good resolution of this fluid collection.  On review of her vitals, she has had minimal output throughout the course of the day.  Would obtain a CT chest prior to removal of the chest tube.  No need for transfer to [facility] at this point, unless she has a residual space on cross-sectional imaging.  Continue chest tube management for now.  Please call with questions  Corliss Skains

## 2020-09-01 NOTE — Progress Notes (Signed)
[facility] SURGICAL PROGRESS NOTE (cpt [phone])  Hospital Day(s): 1.   Interval History: Patient seen and examined, no acute events or new complaints overnight. Patient reports she has some right sided chest discomfort from the chest tube but overall feels better. She denies fever, chills, cough, nausea, emesis. Leukocytosis now resolved at 5.9K. Hyponatremia improved to 133. sCr - 1.09 which is stable. She had chest tube placed with IR yesterday (03/02). cx from this grow gram + cocci in pairs and clusters. Further work up pending. Chest tube output recorded at 565 ccs and is seropurulent. She continues on Azithromycin and Rocephin.   Review of Systems:  Constitutional: denies fever, chills  HEENT: denies cough or congestion  Respiratory: denies any shortness of breath  Cardiovascular: + chest pain (from chest tube), denied palpitations  Gastrointestinal: denies abdominal pain, N/V, or diarrhea/and bowel function as per interval history Genitourinary: denies burning with urination or urinary frequency  Vital signs in last 24 hours: [min-max] current  Temp:  [97.5 F (36.4 C)-98.1 F (36.7 C)] 97.8 F (36.6 C) (03/03 0349) Pulse Rate:  [67-111] 67 (03/03 0349) Resp:  [15-30] 16 (03/03 0349) BP: (92-162)/(61-119) 131/85 (03/03 0349) SpO2:  [94 %-99 %] 98 % (03/03 0349) Weight:  [44.1 kg] 44.1 kg (03/02 2134)     Height: 5\' 3"  (160 cm) Weight: 44.1 kg BMI (Calculated): 17.23   Intake/Output last 2 shifts:  03/02 0701 - 03/03 0700 In: 2270.1 [I.V.:720.1; IV Piggyback:1550] Out: 1015 [Drains:565]   Physical Exam:  Constitutional: alert, cooperative and no distress  HENT: normocephalic without obvious abnormality  Eyes: PERRL, EOM's grossly intact and symmetric  Respiratory: breathing non-labored at rest, decrease breath sounds on right Cardiovascular: regular rate and sinus rhythm  Chest: Chest tube to the right lateral chest wall, site CDI, no air leak, output  seropurulent   Labs:  CBC Latest Ref Rng & Units 09/01/2020 08/31/2020 01/24/2019  WBC 4.0 - 10.5 K/uL 5.9 10.7(H) 6.8  Hemoglobin 12.0 - 15.0 g/dL 10.4(L) 11.1(L) 14.3  Hematocrit 36.0 - 46.0 % 31.3(L) 32.9(L) 42.4  Platelets 150 - 400 K/uL 372 463(H) 170   CMP Latest Ref Rng & Units 09/01/2020 08/31/2020 01/24/2019  Glucose 70 - 99 mg/dL 01/26/2019) 259(D) 92  BUN 6 - 20 mg/dL 13 10 638(V)  Creatinine 0.44 - 1.00 mg/dL 56(E) 3.32(R) 5.18(A  Sodium 135 - 145 mmol/L 133(L) 128(L) 147(H)  Potassium 3.5 - 5.1 mmol/L 3.7 3.7 3.5  Chloride 98 - 111 mmol/L 106 97(L) 115(H)  CO2 22 - 32 mmol/L 21(L) 22 23  Calcium 8.9 - 10.3 mg/dL 4.16) 9.0 6.0(Y)  Total Protein 6.5 - 8.1 g/dL - - 7.7  Total Bilirubin 0.3 - 1.2 mg/dL - - 0.6  Alkaline Phos 38 - 126 U/L - - 73  AST 15 - 41 U/L - - 135(H)  ALT 0 - 44 U/L - - 93(H)    Imaging studies:   CXR (09/01/2020), I am unable to view images this morning, reviewed report from radiologist:  Right chest tube in good position.no pneumothorax noted, small right pleural effusion   Assessment/Plan: (ICD-10's: J86.9) 60 y.o. female with large right sided hydropneumothorax, likely empyema s/p chest tube placement on 03/02, complicated by pertinent comorbidities including HTN, Hep C, COPD, and tobacco abuse.   - I do think this patient's care will be best served at [facility] secondary to our lack of thoracic surgery services available here currently. I will attempt to consult with TCTS to aid in  transfer process. I suspect she will benefit from either tPA instillation in her chest tube and/or decortication as I suspect she has significant rind present based on imaging and may ultimately need decortication    - IVF Support; monitor renal function             - Agree IV ABx             - Pulmonary toilet; IS             - Mobilization as tolerated             - Pain control prn             - Further management per primary service    All of the above findings and  recommendations were discussed with the patient, and the medical team, and all of patient's questions were answered to her expressed satisfaction.  -- Lynden Oxford, PA-C [facility] 09/01/2020, 7:47 AM [phone] M-F: 7am - 4pm

## 2020-09-02 ENCOUNTER — Inpatient Hospital Stay: Payer: Self-pay

## 2020-09-02 DIAGNOSIS — E43 Unspecified severe protein-calorie malnutrition: Secondary | ICD-10-CM | POA: Insufficient documentation

## 2020-09-02 DIAGNOSIS — B9689 Other specified bacterial agents as the cause of diseases classified elsewhere: Secondary | ICD-10-CM

## 2020-09-02 LAB — URINE CULTURE: Culture: 60000 — AB

## 2020-09-02 NOTE — Progress Notes (Addendum)
PROGRESS NOTE  Nicole [name] XKG:[id number] DOB: December 29, 1960 DOA: 08/31/2020 PCP: [facility]  Brief History   The patient is a 60 yr old woman who presented to [facility] ED on 08/31/2020 with complaints of increasing shortness of breath, pleuritic chest pain, and cough productive of purulent sputum. CXR in the ED demonstrated a large loculated right hydropneumothorax. With a mild left basilar pulmonary infiltrate due to infection vs aspiration. CT of the chest with contrast also demonstrated this hydropneumothorax on the right with irregular thick walls that was suspicious for empyema. There was also associated compressive right lower lobe atelectasis. There were scattered subpleural reticulation and ground glass opacities within the aerated portion of the lungs. There was a 6 mm left lower lobe pulmonary nodule that was minimally enlarged since 2010 likely benign. There was also emphysema and AAA 4.2 cm.   Chest tube was placed by interventional radiology and surgery was consulted. CTS was not consulted as there is not CTS service at [facility] at this time. The following morning this writer was contacted by the surgical PA. He told me that the patient needed to go to [facility] for decortication. However, after he discussed the patient with CTS, he told me that they felt that the chest tube needed time to see if it would work. Dr. Cliffton Asters has commented that he felt that there was good resolution of the fluid collection with the catheter. They recommended continuing IV antibiotics and no transfer to [facility] at this point.  The patient has been admitted to a med-surg bed and is receiving IV Unasyn for antibiotic coverage.  Consultants  . Interventional radiology . CTS . General surgery  Procedures  . IR placement of pigtail catheter in the right pleural space.  Antibiotics   Anti-infectives (From admission, onward)   Start     Dose/Rate Route Frequency Ordered Stop   09/02/20 1000   azithromycin (ZITHROMAX) 500 mg in sodium chloride 0.9 % 250 mL IVPB  Status:  Discontinued        500 mg 250 mL/hr over 60 Minutes Intravenous Every 24 hours 09/01/20 1335 09/01/20 1358   09/01/20 1500  Ampicillin-Sulbactam (UNASYN) 3 g in sodium chloride 0.9 % 100 mL IVPB        3 g 200 mL/hr over 30 Minutes Intravenous Every 6 hours 09/01/20 1358     09/01/20 1000  azithromycin (ZITHROMAX) 500 mg in sodium chloride 0.9 % 250 mL IVPB        500 mg 250 mL/hr over 60 Minutes Intravenous  Once 08/31/20 1358 09/01/20 1257   08/31/20 1500  cefTRIAXone (ROCEPHIN) 2 g in sodium chloride 0.9 % 100 mL IVPB  Status:  Discontinued        2 g 200 mL/hr over 30 Minutes Intravenous Every 24 hours 08/31/20 1358 09/01/20 1358   08/31/20 1400  cefTRIAXone (ROCEPHIN) 1 g in sodium chloride 0.9 % 100 mL IVPB  Status:  Discontinued        1 g 200 mL/hr over 30 Minutes Intravenous Every 24 hours 08/31/20 1355 08/31/20 1358   08/31/20 0730  vancomycin (VANCOCIN) IVPB 1000 mg/200 mL premix        1,000 mg 200 mL/hr over 60 Minutes Intravenous  Once 08/31/20 0719 08/31/20 1246   08/31/20 0730  ceFEPIme (MAXIPIME) 2 g in sodium chloride 0.9 % 100 mL IVPB        2 g 200 mL/hr over 30 Minutes Intravenous  Once 08/31/20 0719 08/31/20 0945  08/31/20 0730  azithromycin (ZITHROMAX) 500 mg in sodium chloride 0.9 % 250 mL IVPB        500 mg 250 mL/hr over 60 Minutes Intravenous  Once 08/31/20 0719 08/31/20 1116    .   Subjective  The patient is resting comfortably. No new complaints.  Objective   Vitals:  Vitals:   09/01/20 2338 09/02/20 0323  BP: 133/89 139/87  Pulse: 66 63  Resp: 18 16  Temp: 97.6 F (36.4 C) 98 F (36.7 C)  SpO2: 98% 98%    Exam:  Constitutional:  . The patient is awake, alert, and oriented x 3. Mild distress from pleuritic chest pain. Head: Atrumatic normocephalic. Positive for temporal wasting Respiratory:  . No increased work of breathing. . Dullness to auscultation of the  right mid lung and base. . Diminished breath sounds throughout. . No wheezes, rales, or rhonchi . No tactile fremitus Cardiovascular:  . Regular rate and rhythm . No murmurs, ectopy, or gallups. . No lateral PMI. No thrills. Abdomen:  . Abdomen is soft, non-tender, non-distended . No hernias, masses, or organomegaly . Normoactive bowel sounds.  [name] Musculoskeletal:  . No cyanosis, clubbing, or edema . Cachectic Skin:  . No rashes, lesions, ulcers . palpation of skin: no induration or nodules Neurologic:  . CN 2-12 intact . Sensation all 4 extremities intact Psychiatric:  . Mental status o Mood, affect appropriate o Orientation to person, place, time  . judgment and insight appear intact  I have personally reviewed the following:   Today's Data  . Vitals, CBC, BMP  Micro Data  . Urine culture positive for E. Coli . Blood culture x 2 have had no growth  Imaging  . CXR . CXR 2 view . CT Chest  Cardiology Data  . EKG  Scheduled Meds: . carvedilol  12.5 mg Oral BID WC  . enoxaparin (LOVENOX) injection  40 mg Subcutaneous Q24H  . feeding supplement  237 mL Oral BID BM  . lisinopril  10 mg Oral Daily  . multivitamin with minerals  1 tablet Oral Daily  . nicotine  14 mg Transdermal Daily  . sodium chloride flush  3 mL Intravenous Q12H   Continuous Infusions: . sodium chloride    . sodium chloride Stopped (09/02/20 0351)  . ampicillin-sulbactam (UNASYN) IV 200 mL/hr at 09/02/20 0402    Principal Problem:   Pleural effusion on right Active Problems:   Hypertension   Collagen vascular disease (HCC)   Hyponatremia   Acute lower UTI   Nicotine dependence   Protein-calorie malnutrition, severe   LOS: 2 days   A & P   Right-sided hydropneumothorax: Likely parapneumonic in etiology. Possible associated empyema. However, there has been fair resolution of the fluid collection with the pigtail catheter. Per CTS at [facility] no need for transfer for  decortication at this point. Fluid analysis is pending.  Community-acquired pneumonia: Due to likely anaerobic bacterial source, antibiotic choice has been changed from rocephin and azithromycin to Unasyn WBC is resolving. No fevers. Blood cultures x 2 have had no growth.  UTI: Due to E coli: The patient is receiving Unasyn. Monitor sensitivities.  Nicotine dependence: Noted. Nicotine patch available at 14 mg dose.  Smoking cessation has been discussed with patient in detail.  Hyponatremia: Resolving with IV fluids. Due to hypovolemia. Monitor.   Severe Protein Calorie Malnutrition: evidenced by muscle wasting at temples, cachexia, low albumin, and scaffoid abdominal muscles. Nutrition consulted and supplements ordered.  I have seen and  examined this patient myself. I have spent 46 minutes in her evaluation and care. More than 50% of this was spent in coordination of care with surgery and CTS.  DVT prophylaxis: Lovenox Code Status: full code Family Communication: None available Disposition Plan:  Status is: Inpatient  Remains inpatient appropriate because:IV treatments appropriate due to intensity of illness or inability to take PO   Dispo: The patient is from: Home              Anticipated d/c is to: Home              Patient currently is not medically stable to d/c.   Difficult to place patient No  Ava Swayze, DO [facility] Direct contact: see [email or web address]  7PM-7AM contact night coverage as above 09/01/2020, 1:46 PM  LOS: 2 days

## 2020-09-02 NOTE — Progress Notes (Addendum)
[facility] SUR[facility]ICAL PROGRESS NOTE (cpt [phone])  Hospital Day(s): 2.   Interval History: Patient seen and examined, no acute events or new complaints overnight. Patient reports she is feeling better aside from discomfort in her right chest from the chest tube. She denied fever, chills cough, SOB. No new labs or imaging this morning.  Chest tube output recorded at 10 ccs; more serous this morning. Cx from this grow gram + cocci in pairs and clusters. She continues on Azithromycin and Rocephin.   Review of Systems:  Constitutional: denies fever, chills  HEENT: denies cough or congestion  Respiratory: denies any shortness of breath  Cardiovascular: + chest pain (from chest tube); denied palpitations  Gastrointestinal: denies abdominal pain, N/V, or diarrhea/and bowel function as per interval history Genitourinary: denies burning with urination or urinary frequency   Vital signs in last 24 hours: [min-max] current  Temp:  [97.6 F (36.4 C)-98.6 F (37 C)] 98 F (36.7 C) (03/04 0323) Pulse Rate:  [63-76] 63 (03/04 0323) Resp:  [16-18] 16 (03/04 0323) BP: (125-139)/(78-89) 139/87 (03/04 0323) SpO2:  [97 %-100 %] 98 % (03/04 0323)     Height: 5\' 3"  (160 cm) Weight: 44.1 kg BMI (Calculated): 17.23   Intake/Output last 2 shifts:  03/03 0701 - 03/04 0700 In: 2873.8 [P.O.:480; I.V.:1907.7; IV Piggyback:486.2] Out: 10 [Drains:10]   Physical Exam:  Constitutional: alert, cooperative and no distress  HENT: normocephalic without obvious abnormality  Eyes: PERRL, EOM's grossly intact and symmetric  Respiratory: breathing non-labored at rest, decrease breath sounds on right Cardiovascular: regular rate and sinus rhythm  Chest: Chest tube to the right lateral chest wall, site CDI, no air leak, output slowing and more serous   Labs:  CBC Latest Ref Rng & Units 09/01/2020 08/31/2020 01/24/2019  WBC 4.0 - 10.5 K/uL 5.9 10.7(H) 6.8  Hemoglobin 12.0 - 15.0 g/dL 10.4(L) 11.1(L) 14.3   Hematocrit 36.0 - 46.0 % 31.3(L) 32.9(L) 42.4  Platelets 150 - 400 K/uL 372 463(H) 170   CMP Latest Ref Rng & Units 09/01/2020 08/31/2020 01/24/2019  Glucose 70 - 99 mg/dL 01/26/2019) 563(S) 92  BUN 6 - 20 mg/dL 13 10 937(D)  Creatinine 0.44 - 1.00 mg/dL 42(A) 7.68(T) 1.57(W  Sodium 135 - 145 mmol/L 133(L) 128(L) 147(H)  Potassium 3.5 - 5.1 mmol/L 3.7 3.7 3.5  Chloride 98 - 111 mmol/L 106 97(L) 115(H)  CO2 22 - 32 mmol/L 21(L) 22 23  Calcium 8.9 - 10.3 mg/dL 6.20) 9.0 3.5(D)  Total Protein 6.5 - 8.1 g/dL - - 7.7  Total Bilirubin 0.3 - 1.2 mg/dL - - 0.6  Alkaline Phos 38 - 126 U/L - - 73  AST 15 - 41 U/L - - 135(H)  ALT 0 - 44 U/L - - 93(H)     Imaging studies: No new pertinent imaging studies   Assessment/Plan: (ICD-10's: J86.9) 60 y.o. female with large right sided hydropneumothorax, likely empyema s/p chest tube placement on 03/02, complicated by pertinent comorbidities includingHTN, Hep C, COPD, and tobacco abuse.   - I did review patient's case with TCTS yesterday. They recommended repeating CT Chest in another 24-48 hours and if her empyema space is obliterated, chest tube can be removed and she can follow up with infectious disease. I have ordered CT Chest for tomorrow morning.   - Place CT to water seal today; monitor and record output  - CXR this afternoon  - IVF Support; monitor renal function - Agree IV ABx - Pulmonary toilet; IS - Mobilization as  tolerated - Pain control prn - Further management per primary service; we will follow   All of the above findings and recommendations were discussed with the patient, and the medical team, and all of patient's questions were answered to her expressed satisfaction.  -- Lynden Oxford, PA-C [facility] 09/02/2020, 7:26 AM [phone] M-F: 7am - 4pm

## 2020-09-02 NOTE — Progress Notes (Signed)
PROGRESS NOTE  Nicole [name] WVP:[id number] DOB: January 11, 1961 DOA: 08/31/2020 PCP: [facility]  Brief History   The patient is a 60 yr old woman who presented to [facility] ED on 08/31/2020 with complaints of increasing shortness of breath, pleuritic chest pain, and cough productive of purulent sputum. CXR in the ED demonstrated a large loculated right hydropneumothorax. With a mild left basilar pulmonary infiltrate due to infection vs aspiration. CT of the chest with contrast also demonstrated this hydropneumothorax on the right with irregular thick walls that was suspicious for empyema. There was also associated compressive right lower lobe atelectasis. There were scattered subpleural reticulation and ground glass opacities within the aerated portion of the lungs. There was a 6 mm left lower lobe pulmonary nodule that was minimally enlarged since 2010 likely benign. There was also emphysema and AAA 4.2 cm.   Chest tube was placed by interventional radiology and surgery was consulted. CTS was not consulted as there is not CTS service at [facility] at this time. The following morning this writer was contacted by the surgical PA. He told me that the patient needed to go to [facility] for decortication. However, after he discussed the patient with CTS, he told me that they felt that the chest tube needed time to see if it would work. Dr. Cliffton Asters has commented that he felt that there was good resolution of the fluid collection with the catheter. They recommended continuing IV antibiotics and no transfer to [facility] at this point.  The patient has been admitted to a med-surg bed and is receiving IV Unasyn for antibiotic coverage.  CXR this am shows interval clearing of effusion in the right lung.   Consultants  . Interventional radiology . CTS . General surgery  Procedures  . IR placement of pigtail catheter in the right pleural space.  Antibiotics   Anti-infectives (From admission,  onward)   Start     Dose/Rate Route Frequency Ordered Stop   09/02/20 1000  azithromycin (ZITHROMAX) 500 mg in sodium chloride 0.9 % 250 mL IVPB  Status:  Discontinued        500 mg 250 mL/hr over 60 Minutes Intravenous Every 24 hours 09/01/20 1335 09/01/20 1358   09/01/20 1500  Ampicillin-Sulbactam (UNASYN) 3 g in sodium chloride 0.9 % 100 mL IVPB        3 g 200 mL/hr over 30 Minutes Intravenous Every 6 hours 09/01/20 1358     09/01/20 1000  azithromycin (ZITHROMAX) 500 mg in sodium chloride 0.9 % 250 mL IVPB        500 mg 250 mL/hr over 60 Minutes Intravenous  Once 08/31/20 1358 09/01/20 1257   08/31/20 1500  cefTRIAXone (ROCEPHIN) 2 g in sodium chloride 0.9 % 100 mL IVPB  Status:  Discontinued        2 g 200 mL/hr over 30 Minutes Intravenous Every 24 hours 08/31/20 1358 09/01/20 1358   08/31/20 1400  cefTRIAXone (ROCEPHIN) 1 g in sodium chloride 0.9 % 100 mL IVPB  Status:  Discontinued        1 g 200 mL/hr over 30 Minutes Intravenous Every 24 hours 08/31/20 1355 08/31/20 1358   08/31/20 0730  vancomycin (VANCOCIN) IVPB 1000 mg/200 mL premix        1,000 mg 200 mL/hr over 60 Minutes Intravenous  Once 08/31/20 0719 08/31/20 1246   08/31/20 0730  ceFEPIme (MAXIPIME) 2 g in sodium chloride 0.9 % 100 mL IVPB        2  g 200 mL/hr over 30 Minutes Intravenous  Once 08/31/20 0719 08/31/20 0945   08/31/20 0730  azithromycin (ZITHROMAX) 500 mg in sodium chloride 0.9 % 250 mL IVPB        500 mg 250 mL/hr over 60 Minutes Intravenous  Once 08/31/20 0719 08/31/20 1116      Subjective  The patient is resting comfortably. No new complaints.  Objective   Vitals:  Vitals:   09/02/20 1138 09/02/20 1509  BP: (!) 164/100 (!) 148/86  Pulse: 67 66  Resp: 17 16  Temp: 97.9 F (36.6 C) 97.9 F (36.6 C)  SpO2: 100% 98%    Exam:  Constitutional:  . The patient is awake, alert, and oriented x 3. Mild distress from pleuritic chest pain. Head: Atrumatic normocephalic. Positive for temporal  wasting Respiratory:  . No increased work of breathing. . Dullness to auscultation of the right mid lung and base. . Diminished breath sounds throughout. . No wheezes, rales, or rhonchi . No tactile fremitus Cardiovascular:  . Regular rate and rhythm . No murmurs, ectopy, or gallups. . No lateral PMI. No thrills. Abdomen:  . Abdomen is soft, non-tender, non-distended . No hernias, masses, or organomegaly . Normoactive bowel sounds.  Nicole [name] Musculoskeletal:  . No cyanosis, clubbing, or edema . Cachectic Skin:  . No rashes, lesions, ulcers . palpation of skin: no induration or nodules Neurologic:  . CN 2-12 intact . Sensation all 4 extremities intact Psychiatric:  . Mental status o Mood, affect appropriate o Orientation to person, place, time  . judgment and insight appear intact  I have personally reviewed the following:   Today's Data  . Vitals, CBC, BMP  Micro Data  . Urine culture positive for E. Coli . Blood culture x 2 have had no growth  Imaging  . CXR . CXR 2 view . CT Chest  Cardiology Data  . EKG  Scheduled Meds: . carvedilol  12.5 mg Oral BID WC  . enoxaparin (LOVENOX) injection  40 mg Subcutaneous Q24H  . feeding supplement  237 mL Oral BID BM  . lisinopril  10 mg Oral Daily  . multivitamin with minerals  1 tablet Oral Daily  . nicotine  14 mg Transdermal Daily  . sodium chloride flush  3 mL Intravenous Q12H   Continuous Infusions: . sodium chloride    . sodium chloride 100 mL/hr at 09/02/20 1539  . ampicillin-sulbactam (UNASYN) IV 3 g (09/02/20 1539)    Principal Problem:   Pleural effusion on right Active Problems:   Hypertension   Collagen vascular disease (HCC)   Hyponatremia   Acute lower UTI   Nicotine dependence   Protein-calorie malnutrition, severe   LOS: 2 days   A & P   Right-sided hydropneumothorax: Likely parapneumonic in etiology. Possible associated empyema. However, there has been fair resolution of the fluid  collection with the pigtail catheter. Per CTS at [facility] no need for transfer for decortication at this point. Fluid analysis is pending.  Community-acquired pneumonia: Due to likely anaerobic bacterial source, antibiotic choice has been changed from rocephin and azithromycin to Unasyn WBC is resolving. No fevers. Blood cultures x 2 have had no growth.  UTI: Due to E coli: The patient is receiving Unasyn. Monitor sensitivities.  Nicotine dependence: Noted. Nicotine patch available at 14 mg dose.  Smoking cessation has been discussed with patient in detail.  Hyponatremia: Resolving with IV fluids. Due to hypovolemia. Monitor.   Severe Protein Calorie Malnutrition: evidenced by muscle wasting at temples,  cachexia, low albumin, and scaffoid abdominal muscles. Nutrition consulted and supplements ordered.  I have seen and examined this patient myself. I have spent 46 minutes in her evaluation and care. More than 50% of this was spent in coordination of care with surgery and CTS.  DVT prophylaxis: Lovenox Code Status: full code Family Communication: None available Disposition Plan:  Status is: Inpatient  Remains inpatient appropriate because:IV treatments appropriate due to intensity of illness or inability to take PO   Dispo: The patient is from: Home              Anticipated d/c is to: Home              Patient currently is not medically stable to d/c.   Difficult to place patient No  Nicole Royals, DO [facility] Direct contact: see [email or web address]  7PM-7AM contact night coverage as above 09/02/2020, 5:38 PM  LOS: 2 days

## 2020-09-03 ENCOUNTER — Inpatient Hospital Stay: Payer: Self-pay

## 2020-09-03 ENCOUNTER — Encounter: Payer: Self-pay | Admitting: Internal Medicine

## 2020-09-03 MED ORDER — VANCOMYCIN HCL 500 MG/100ML IV SOLN
500.0000 mg | INTRAVENOUS | Status: DC
  Filled 2020-09-03: qty 100

## 2020-09-03 MED ORDER — IOHEXOL 300 MG/ML  SOLN
75.0000 mL | Freq: Once | INTRAMUSCULAR | Status: AC | PRN
  Administered 2020-09-03: 75 mL via INTRAVENOUS

## 2020-09-03 MED ORDER — TRAZODONE HCL 50 MG PO TABS
50.0000 mg | ORAL_TABLET | Freq: Every evening | ORAL | Status: DC | PRN
  Administered 2020-09-03 – 2020-09-05 (×2): 50 mg via ORAL
  Filled 2020-09-03 (×2): qty 1

## 2020-09-03 MED ORDER — VANCOMYCIN HCL 750 MG/150ML IV SOLN
750.0000 mg | Freq: Once | INTRAVENOUS | Status: AC
  Administered 2020-09-03: 750 mg via INTRAVENOUS
  Filled 2020-09-03 (×2): qty 150

## 2020-09-03 NOTE — Progress Notes (Signed)
Pharmacy Antibiotic Note  Nicole [name] is a 60 y.o. female admitted on 08/31/2020 with increasing shortness of breath, pleuritic chest pain, and cough productive of purulent sputum. CXR in the ED demonstrated a large loculated right hydropneumothorax. With a mild left basilar pulmonary infiltrate due to infection vs aspiration. Chest tube was placed by interventional radiology. 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  Pharmacy has been consulted for vancomycin dosing.  Plan:  Start vancomycin 750 mg IV x 1 then 500 mg IV every  24 hrs  Goal AUC 400-550  Expected AUC: 423.9  SCr used: 1.09 mg/dL  Ke: 5.361 h-1, W4/3: 15.4 h  Daily renal function assessment while on IV vancomycin  Levels as clinically indicated   Height: 5\' 3"  (160 cm) Weight: 44.1 kg (97 lb 3.6 oz) IBW/kg (Calculated) : 52.4  Temp (24hrs), Avg:97.9 F (36.6 C), Min:97.6 F (36.4 C), Max:98.2 F (36.8 C)  Recent Labs  Lab 08/31/20 0643 08/31/20 0847 09/01/20 0523  WBC 10.7*  --  5.9  CREATININE 1.05*  --  1.09*  LATICACIDVEN  --  0.9  --     Estimated Creatinine Clearance: 39.6 mL/min (A) (by C-G formula based on SCr of 1.09 mg/dL (H)).    No Known Allergies  Antimicrobials this admission: azithromycin 03/02 >> 03/03 Unasyn 03/03 >>  vancomycin 03/05 >>  Microbiology results: 03/02 BCx: NG x 3 days 03/02 UCx: E coli  03/02 tracheal Asp: MRSA 03/02 SARS CoV-2: negative 03/02 influenza A/B: negative  Thank you for allowing pharmacy to be a part of this patient's care.  05/02 09/03/2020 2:24 PM

## 2020-09-03 NOTE — Progress Notes (Signed)
Subjective:  CC: Nicole [name] is a 60 y.o. female  Hospital stay day 3,   empyema  HPI: No acute issues overnight  ROS:  General: Denies weight loss, weight gain, fatigue, fevers, chills, and night sweats. Heart: Denies chest pain, palpitations, racing heart, irregular heartbeat, leg pain or swelling, and decreased activity tolerance. Respiratory: Denies breathing difficulty, shortness of breath, wheezing, cough, and sputum. GI: Denies change in appetite, heartburn, nausea, vomiting, constipation, diarrhea, and blood in stool. GU: Denies difficulty urinating, pain with urinating, urgency, frequency, blood in urine.   Objective:   Temp:  [97.6 F (36.4 C)-98.2 F (36.8 C)] 97.7 F (36.5 C) (03/05 1600) Pulse Rate:  [60-83] 62 (03/05 1655) Resp:  [16-20] 18 (03/05 1653) BP: (147-173)/(84-107) 166/93 (03/05 1657) SpO2:  [99 %-100 %] 100 % (03/05 1653)     Height: 5\' 3"  (160 cm) Weight: 44.1 kg BMI (Calculated): 17.23   Intake/Output this shift:   Intake/Output Summary (Last 24 hours) at 09/03/2020 1855 Last data filed at 09/03/2020 11/03/2020 Gross per 24 hour  Intake 1396.58 ml  Output 30 ml  Net 1366.58 ml    Constitutional :  alert, cooperative, appears stated age and no distress  Respiratory:  clear to auscultation bilaterally. Chest tube with straw colored output  Cardiovascular:  regular rate and rhythm  Gastrointestinal: soft, non-tender; bowel sounds normal; no masses,  no organomegaly.   Skin: Cool and moist.   Psychiatric: Normal affect, non-agitated, not confused       LABS:  CMP Latest Ref Rng & Units 09/01/2020 08/31/2020 01/24/2019  Glucose 70 - 99 mg/dL 01/26/2019) 784(O) 92  BUN 6 - 20 mg/dL 13 10 962(X)  Creatinine 0.44 - 1.00 mg/dL 52(W) 4.13(K) 4.40(N  Sodium 135 - 145 mmol/L 133(L) 128(L) 147(H)  Potassium 3.5 - 5.1 mmol/L 3.7 3.7 3.5  Chloride 98 - 111 mmol/L 106 97(L) 115(H)  CO2 22 - 32 mmol/L 21(L) 22 23  Calcium 8.9 - 10.3 mg/dL 0.27) 9.0 2.5(D)  Total  Protein 6.5 - 8.1 g/dL - - 7.7  Total Bilirubin 0.3 - 1.2 mg/dL - - 0.6  Alkaline Phos 38 - 126 U/L - - 73  AST 15 - 41 U/L - - 135(H)  ALT 0 - 44 U/L - - 93(H)   CBC Latest Ref Rng & Units 09/01/2020 08/31/2020 01/24/2019  WBC 4.0 - 10.5 K/uL 5.9 10.7(H) 6.8  Hemoglobin 12.0 - 15.0 g/dL 10.4(L) 11.1(L) 14.3  Hematocrit 36.0 - 46.0 % 31.3(L) 32.9(L) 42.4  Platelets 150 - 400 K/uL 372 463(H) 170    RADS: CLINICAL DATA:  Respiratory illness. Nondiagnostic x-ray. Follow-up right suspected empyema.  EXAM: CT CHEST WITH CONTRAST  TECHNIQUE: Multidetector CT imaging of the chest was performed during intravenous contrast administration.  CONTRAST:  76mL OMNIPAQUE IOHEXOL 300 MG/ML  SOLN  COMPARISON:  Chest x-ray September 02, 2020.  Chest CT August 31, 2020.  FINDINGS: Cardiovascular: Atherosclerotic change seen in the thoracic aorta without dissection. The ascending thoracic aorta is mildly dilated measuring up to 4.2 cm near its root. Calcified atherosclerosis in the LAD. The heart is otherwise normal in appearance. Central pulmonary arteries are poorly opacified normal in caliber.  Mediastinum/Nodes: No left-sided pleural effusion. The thick walled fluid collection containing air on the comparison CT scan is partially collapsed, now containing a chest tube located within the fluid. There has been significant improvement in the size of the suspected empyema. No pericardial effusion. The chest wall is normal. The thyroid and esophagus are  normal. Shotty nodes in the precarinal region are likely reactive. Shotty nodes in the right inferior hilum are likely reactive.  Lungs/Pleura: Central airways are normal. Other than the air in the right remaining pleural collection, no air seen in the pleural spaces. Emphysematous changes are noted. Subpleural reticulations are stable. The left lower lobe 6 mm pulmonary nodule is without significant change. No new nodules or masses. No new  infiltrates. Mild tree-in-bud opacity in the right lateral lung base remains. Mild opacity in the left lateral lung base remains.  Upper Abdomen: No interval changes or acute abnormalities.  Musculoskeletal: No chest wall abnormality. No acute or significant osseous findings.  IMPRESSION: 1. The suspected right empyema persists but has significantly improved, much smaller in size, in the interval. 2. Probable reactive nodes in the precarinal region and right infrahilar region. 3. Mild opacities in the bases worrisome for a mild stable infectious process. 4. Mild emphysema. 5. 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 6. Stable 6 mm nodule in the left base, unchanged since 2010 based on the previous CT report, consistent with a benign nodule. 7. Mild atherosclerotic changes in the thoracic aorta. Coronary artery disease.  Aortic Atherosclerosis (ICD10-I70.0) and Emphysema (ICD10-J43.9).   Electronically Signed   By: Gerome Sam III M.D   On: 09/03/2020 09:23 Assessment:   Empyema, improving, but not completely resolved. Still draining on water seal. Recommended continuing throughout weekend, monitor for recurrence

## 2020-09-03 NOTE — Progress Notes (Signed)
PROGRESS NOTE  Nicole [name] GXQ:[id number] DOB: 09-22-1960 DOA: 08/31/2020 [facility]: [facility]  Brief History   The patient is a 60 yr old woman who presented to [facility] ED on 08/31/2020 with complaints of increasing shortness of breath, pleuritic chest pain, and cough productive of purulent sputum. CXR in the ED demonstrated a large loculated right hydropneumothorax. With a mild left basilar pulmonary infiltrate due to infection vs aspiration. CT of the chest with contrast also demonstrated this hydropneumothorax on the right with irregular thick walls that was suspicious for empyema. There was also associated compressive right lower lobe atelectasis. There were scattered subpleural reticulation and ground glass opacities within the aerated portion of the lungs. There was a 6 mm left lower lobe pulmonary nodule that was minimally enlarged since 2010 likely benign. There was also emphysema and AAA 4.2 cm.   Chest tube was placed by interventional radiology and surgery was consulted. CTS was not consulted as there is not CTS service at [facility] at this time. The following morning this writer was contacted by the surgical PA. He told me that the patient needed to go to [facility] for decortication. However, after he discussed the patient with CTS, he told me that they felt that the chest tube needed time to see if it would work. Nicole [name] has commented that he felt that there was good resolution of the fluid collection with the catheter. They recommended continuing IV antibiotics and no transfer to [facility] at this point. Repeat CT chest has demonstrated right empyema persists but has significantly improved. There are probable reactive nodes in the precarinal region and right infrahilar region. Mild opacities in the bases are worrisome for a mild stable infectious process. There is emphysema, a 41mm nodule that is stable in the left base that is unchanged from 2010. Awaiting opinion from surgery  or CTS. Doubt that decortication is necessary. The patient is receiving Vancomycin, unasyn.  Pleural fluid has grown out MRSA. Vancomycin has been added to antibiotics.  The patient has been admitted to a med-surg bed and is receiving IV Unasyn for antibiotic coverage. Blood cultures x 2 have had no growth.  CXR this am shows interval clearing of effusion in the right lung.   Consultants  . Interventional radiology . CTS . General surgery  Procedures  . IR placement of pigtail catheter in the right pleural space.  Antibiotics   Anti-infectives (From admission, onward)   Start     Dose/Rate Route Frequency Ordered Stop   09/04/20 1400  vancomycin (VANCOREADY) IVPB 500 mg/100 mL        500 mg 100 mL/hr over 60 Minutes Intravenous Every 24 hours 09/03/20 1441     09/03/20 1600  vancomycin (VANCOREADY) IVPB 750 mg/150 mL        750 mg 150 mL/hr over 60 Minutes Intravenous  Once 09/03/20 1441     09/02/20 1000  azithromycin (ZITHROMAX) 500 mg in sodium chloride 0.9 % 250 mL IVPB  Status:  Discontinued        500 mg 250 mL/hr over 60 Minutes Intravenous Every 24 hours 09/01/20 1335 09/01/20 1358   09/01/20 1500  Ampicillin-Sulbactam (UNASYN) 3 g in sodium chloride 0.9 % 100 mL IVPB        3 g 200 mL/hr over 30 Minutes Intravenous Every 6 hours 09/01/20 1358     09/01/20 1000  azithromycin (ZITHROMAX) 500 mg in sodium chloride 0.9 % 250 mL IVPB  500 mg 250 mL/hr over 60 Minutes Intravenous  Once 08/31/20 1358 09/01/20 1257   08/31/20 1500  cefTRIAXone (ROCEPHIN) 2 g in sodium chloride 0.9 % 100 mL IVPB  Status:  Discontinued        2 g 200 mL/hr over 30 Minutes Intravenous Every 24 hours 08/31/20 1358 09/01/20 1358   08/31/20 1400  cefTRIAXone (ROCEPHIN) 1 g in sodium chloride 0.9 % 100 mL IVPB  Status:  Discontinued        1 g 200 mL/hr over 30 Minutes Intravenous Every 24 hours 08/31/20 1355 08/31/20 1358   08/31/20 0730  vancomycin (VANCOCIN) IVPB 1000 mg/200 mL premix         1,000 mg 200 mL/hr over 60 Minutes Intravenous  Once 08/31/20 0719 08/31/20 1246   08/31/20 0730  ceFEPIme (MAXIPIME) 2 g in sodium chloride 0.9 % 100 mL IVPB        2 g 200 mL/hr over 30 Minutes Intravenous  Once 08/31/20 0719 08/31/20 0945   08/31/20 0730  azithromycin (ZITHROMAX) 500 mg in sodium chloride 0.9 % 250 mL IVPB        500 mg 250 mL/hr over 60 Minutes Intravenous  Once 08/31/20 0719 08/31/20 1116      Subjective  The patient is resting comfortably. No new complaints.  Objective   Vitals:  Vitals:   09/03/20 0800 09/03/20 1149  BP: (!) 150/84 (!) 147/92  Pulse: 72 60  Resp: 20 20  Temp: 97.6 F (36.4 C) 97.8 F (36.6 C)  SpO2: 99% 99%    Exam:  Constitutional:  . The patient is awake, alert, and oriented x 3. Mild distress from pleuritic chest pain. Head: Atrumatic normocephalic. Positive for temporal wasting Respiratory:  . No increased work of breathing. . Dullness to auscultation of the right mid lung and base. . Diminished breath sounds throughout. . No wheezes, rales, or rhonchi . No tactile fremitus Cardiovascular:  . Regular rate and rhythm . No murmurs, ectopy, or gallups. . No lateral PMI. No thrills. Abdomen:  . Abdomen is soft, non-tender, non-distended . No hernias, masses, or organomegaly . Normoactive bowel sounds.  Nicole [name] Musculoskeletal:  . No cyanosis, clubbing, or edema . Cachectic Skin:  . No rashes, lesions, ulcers . palpation of skin: no induration or nodules Neurologic:  . CN 2-12 intact . Sensation all 4 extremities intact Psychiatric:  . Mental status o Mood, affect appropriate o Orientation to person, place, time  . judgment and insight appear intact  I have personally reviewed the following:   Today's Data  . Vitals, CBC, BMP  Micro Data  . Urine culture positive for E. Coli . Blood culture x 2 have had no growth  Imaging  . CXR . CXR 2 view . CT Chest  Cardiology Data  . EKG  Scheduled  Meds: . carvedilol  12.5 mg Oral BID WC  . enoxaparin (LOVENOX) injection  40 mg Subcutaneous Q24H  . feeding supplement  237 mL Oral BID BM  . lisinopril  10 mg Oral Daily  . multivitamin with minerals  1 tablet Oral Daily  . nicotine  14 mg Transdermal Daily  . sodium chloride flush  3 mL Intravenous Q12H   Continuous Infusions: . sodium chloride    . ampicillin-sulbactam (UNASYN) IV 3 g (09/03/20 1502)  . [START ON 09/04/2020] vancomycin    . vancomycin      Principal Problem:   Pleural effusion on right Active Problems:   Hypertension  Collagen vascular disease (HCC)   Hyponatremia   Acute lower UTI   Nicotine dependence   Protein-calorie malnutrition, severe   LOS: 3 days   A & P   Right-sided hydropneumothorax: Likely parapneumonic in etiology. Possible associated empyema. However, there has been fair resolution of the fluid collection with the pigtail catheter. Per CTS at [facility] no need for transfer for decortication at this point. Fluid analysis is pending. Repeat CT chest has demonstrated right empyema persists but has significantly improved. There are probable reactive nodes in the precarinal region and right infrahilar region. Mild opacities in the bases are worrisome for a mild stable infectious process. There is emphysema, a 25mm nodule that is stable in the left base that is unchanged from 2010. Awaiting opinion from surgery or CTS. Doubt that decortication is necessary. The patient is receiving Vancomycin, unasyn.   Community-acquired pneumonia: Due to likely anaerobic bacterial source, antibiotic choice has been changed from rocephin and azithromycin to Unasyn WBC is resolving. No fevers. Blood cultures x 2 have had no growth.  UTI: Due to E coli: The patient is receiving Unasyn. Monitor sensitivities.  Nicotine dependence: Noted. Nicotine patch available at 14 mg dose.  Smoking cessation has been discussed with patient in detail.  Hyponatremia: Resolving with  IV fluids. Due to hypovolemia. Monitor.   Severe Protein Calorie Malnutrition: evidenced by muscle wasting at temples, cachexia, low albumin, and scaffoid abdominal muscles. Nutrition consulted and supplements ordered.  I have seen and examined this patient myself. I have spent 46 minutes in her evaluation and care. More than 50% of this was spent in coordination of care with surgery and CTS.  DVT prophylaxis: Lovenox Code Status: full code Family Communication: None available Disposition Plan:  Status is: Inpatient  Remains inpatient appropriate because:IV treatments appropriate due to intensity of illness or inability to take PO   Dispo: The patient is from: Home              Anticipated d/c is to: Home              Patient currently is not medically stable to d/c.   Difficult to place patient No  Ava Swayze, DO [facility] Direct contact: see [email or web address]  7PM-7AM contact night coverage as above 09/03/2020, 4:33 PM  LOS: 2 days

## 2020-09-04 LAB — CBC WITH DIFFERENTIAL/PLATELET
Abs Immature Granulocytes: 0.04 10*3/uL (ref 0.00–0.07)
Basophils Absolute: 0.1 10*3/uL (ref 0.0–0.1)
Basophils Relative: 1 %
Eosinophils Absolute: 0.1 10*3/uL (ref 0.0–0.5)
Eosinophils Relative: 1 %
HCT: 34.1 % — ABNORMAL LOW (ref 36.0–46.0)
Hemoglobin: 11.6 g/dL — ABNORMAL LOW (ref 12.0–15.0)
Immature Granulocytes: 1 %
Lymphocytes Relative: 38 %
Lymphs Abs: 2.6 10*3/uL (ref 0.7–4.0)
MCH: 32.1 pg (ref 26.0–34.0)
MCHC: 34 g/dL (ref 30.0–36.0)
MCV: 94.5 fL (ref 80.0–100.0)
Monocytes Absolute: 0.6 10*3/uL (ref 0.1–1.0)
Monocytes Relative: 8 %
Neutro Abs: 3.6 10*3/uL (ref 1.7–7.7)
Neutrophils Relative %: 51 %
Platelets: 419 10*3/uL — ABNORMAL HIGH (ref 150–400)
RBC: 3.61 MIL/uL — ABNORMAL LOW (ref 3.87–5.11)
RDW: 15.7 % — ABNORMAL HIGH (ref 11.5–15.5)
Smear Review: NORMAL
WBC: 7 10*3/uL (ref 4.0–10.5)
nRBC: 0 % (ref 0.0–0.2)

## 2020-09-04 LAB — COMPREHENSIVE METABOLIC PANEL
ALT: 21 U/L (ref 0–44)
AST: 42 U/L — ABNORMAL HIGH (ref 15–41)
Albumin: 2.3 g/dL — ABNORMAL LOW (ref 3.5–5.0)
Alkaline Phosphatase: 111 U/L (ref 38–126)
Anion gap: 7 (ref 5–15)
BUN: 10 mg/dL (ref 6–20)
CO2: 23 mmol/L (ref 22–32)
Calcium: 9.3 mg/dL (ref 8.9–10.3)
Chloride: 106 mmol/L (ref 98–111)
Creatinine, Ser: 0.89 mg/dL (ref 0.44–1.00)
GFR, Estimated: >60 mL/min (ref >60)
Glucose, Bld: 107 mg/dL — ABNORMAL HIGH (ref 70–99)
Potassium: 4.3 mmol/L (ref 3.5–5.1)
Sodium: 136 mmol/L (ref 135–145)
Total Bilirubin: 0.7 mg/dL (ref 0.3–1.2)
Total Protein: 7.4 g/dL (ref 6.5–8.1)

## 2020-09-04 MED ORDER — SODIUM CHLORIDE 0.9 % IV SOLN
INTRAVENOUS | Status: DC | PRN
  Administered 2020-09-04 – 2020-09-05 (×3): 1000 mL via INTRAVENOUS

## 2020-09-04 MED ORDER — VANCOMYCIN HCL 750 MG/150ML IV SOLN
750.0000 mg | INTRAVENOUS | Status: DC
  Administered 2020-09-04 – 2020-09-05 (×2): 750 mg via INTRAVENOUS
  Filled 2020-09-04 (×4): qty 150

## 2020-09-04 NOTE — Progress Notes (Signed)
Pharmacy Antibiotic Note  Nicole [name] is a 60 y.o. female admitted on 08/31/2020 with increasing shortness of breath, pleuritic chest pain, and cough productive of purulent sputum. CXR in the ED demonstrated a large loculated right hydropneumothorax. With a mild left basilar pulmonary infiltrate due to infection vs aspiration. Chest tube was placed by interventional radiology. 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  Pharmacy has been consulted for vancomycin dosing.  Plan:  Adjust vancomycin dose to 750 mg IV every 24 hrs  Goal AUC 400-550  Expected AUC: 530.7  SCr used: 0.89 mg/dL  Ke: 2.831 h-1, D1/7: 61.6 h  Daily renal function assessment while on IV vancomycin  Levels as clinically indicated   Height: 5\' 3"  (160 cm) Weight: 44.1 kg (97 lb 3.6 oz) IBW/kg (Calculated) : 52.4  Temp (24hrs), Avg:97.7 F (36.5 C), Min:97.5 F (36.4 C), Max:97.8 F (36.6 C)  Recent Labs  Lab 08/31/20 0643 08/31/20 0847 09/01/20 0523 09/04/20 0513  WBC 10.7*  --  5.9 7.0  CREATININE 1.05*  --  1.09* 0.89  LATICACIDVEN  --  0.9  --   --     Estimated Creatinine Clearance: 48.6 mL/min (by C-G formula based on SCr of 0.89 mg/dL).    No Known Allergies  Antimicrobials this admission: azithromycin 03/02 >> 03/03 Unasyn 03/03 >>  vancomycin 03/05 >>  Microbiology results: 03/02 BCx: NG x 3 days 03/02 UCx: E coli  03/02 tracheal Asp: MRSA 03/02 SARS CoV-2: negative 03/02 influenza A/B: negative  Thank you for allowing pharmacy to be a part of this patient's care.  05/02 09/04/2020 10:03 AM

## 2020-09-04 NOTE — Progress Notes (Signed)
PROGRESS NOTE  Nicole [name] VZC:[id number] DOB: Nov 20, 1960 DOA: 08/31/2020 PCP: [facility]  Brief History   The patient is a 60 yr old woman who presented to [facility] ED on 08/31/2020 with complaints of increasing shortness of breath, pleuritic chest pain, and cough productive of purulent sputum. CXR in the ED demonstrated a large loculated right hydropneumothorax. With a mild left basilar pulmonary infiltrate due to infection vs aspiration. CT of the chest with contrast also demonstrated this hydropneumothorax on the right with irregular thick walls that was suspicious for empyema. There was also associated compressive right lower lobe atelectasis. There were scattered subpleural reticulation and ground glass opacities within the aerated portion of the lungs. There was a 6 mm left lower lobe pulmonary nodule that was minimally enlarged since 2010 likely benign. There was also emphysema and AAA 4.2 cm.   Chest tube was placed by interventional radiology and surgery was consulted. CTS was not consulted as there is not CTS service at [facility] at this time. The following morning this writer was contacted by the surgical PA. He told me that the patient needed to go to [facility] for decortication. However, after he discussed the patient with CTS, he told me that they felt that the chest tube needed time to see if it would work. Dr. Cliffton Asters has commented that he felt that there was good resolution of the fluid collection with the catheter. They recommended continuing IV antibiotics and no transfer to [facility] at this point. Repeat CT chest has demonstrated right empyema persists but has significantly improved. There are probable reactive nodes in the precarinal region and right infrahilar region. Mild opacities in the bases are worrisome for a mild stable infectious process. There is emphysema, a 89mm nodule that is stable in the left base that is unchanged from 2010. Awaiting opinion from surgery  or CTS. Doubt that decortication is necessary. The patient is receiving Vancomycin, unasyn.  Pleural fluid has grown out MRSA. Vancomycin has been added to antibiotics.  The patient has been admitted to a med-surg bed and is receiving IV Unasyn for antibiotic coverage. Blood cultures x 2 have had no growth.  CXR on 09/03/2020 shows interval clearing of effusion in the right lung. Repeat CT on 3.5.2022 demonstrates continued improvement. General surgery has put the chest tube back to suction. Possibly to remove tomorrow.  Consultants  . Interventional radiology . CTS . General surgery  Procedures  . IR placement of pigtail catheter in the right pleural space.  Antibiotics   Anti-infectives (From admission, onward)   Start     Dose/Rate Route Frequency Ordered Stop   09/04/20 1400  vancomycin (VANCOREADY) IVPB 500 mg/100 mL  Status:  Discontinued        500 mg 100 mL/hr over 60 Minutes Intravenous Every 24 hours 09/03/20 1441 09/04/20 1005   09/04/20 1200  vancomycin (VANCOREADY) IVPB 750 mg/150 mL        750 mg 150 mL/hr over 60 Minutes Intravenous Every 24 hours 09/04/20 1005     09/03/20 1600  vancomycin (VANCOREADY) IVPB 750 mg/150 mL        750 mg 150 mL/hr over 60 Minutes Intravenous  Once 09/03/20 1441 09/03/20 1748   09/02/20 1000  azithromycin (ZITHROMAX) 500 mg in sodium chloride 0.9 % 250 mL IVPB  Status:  Discontinued        500 mg 250 mL/hr over 60 Minutes Intravenous Every 24 hours 09/01/20 1335 09/01/20 1358   09/01/20 1500  Ampicillin-Sulbactam (UNASYN) 3 g in sodium chloride 0.9 % 100 mL IVPB        3 g 200 mL/hr over 30 Minutes Intravenous Every 6 hours 09/01/20 1358     09/01/20 1000  azithromycin (ZITHROMAX) 500 mg in sodium chloride 0.9 % 250 mL IVPB        500 mg 250 mL/hr over 60 Minutes Intravenous  Once 08/31/20 1358 09/01/20 1257   08/31/20 1500  cefTRIAXone (ROCEPHIN) 2 g in sodium chloride 0.9 % 100 mL IVPB  Status:  Discontinued        2 g 200 mL/hr  over 30 Minutes Intravenous Every 24 hours 08/31/20 1358 09/01/20 1358   08/31/20 1400  cefTRIAXone (ROCEPHIN) 1 g in sodium chloride 0.9 % 100 mL IVPB  Status:  Discontinued        1 g 200 mL/hr over 30 Minutes Intravenous Every 24 hours 08/31/20 1355 08/31/20 1358   08/31/20 0730  vancomycin (VANCOCIN) IVPB 1000 mg/200 mL premix        1,000 mg 200 mL/hr over 60 Minutes Intravenous  Once 08/31/20 0719 08/31/20 1246   08/31/20 0730  ceFEPIme (MAXIPIME) 2 g in sodium chloride 0.9 % 100 mL IVPB        2 g 200 mL/hr over 30 Minutes Intravenous  Once 08/31/20 0719 08/31/20 0945   08/31/20 0730  azithromycin (ZITHROMAX) 500 mg in sodium chloride 0.9 % 250 mL IVPB        500 mg 250 mL/hr over 60 Minutes Intravenous  Once 08/31/20 0719 08/31/20 1116      Subjective  The patient is resting comfortably. No new complaints.  Objective   Vitals:  Vitals:   09/04/20 0446 09/04/20 0729  BP: (!) 169/94 (!) 148/97  Pulse: 69 79  Resp: 19 20  Temp: 97.6 F (36.4 C) (!) 97.5 F (36.4 C)  SpO2: 99% 98%    Exam:  Constitutional:  The patient is awake, alert, and oriented x 3. No acute distress Head: Atrumatic normocephalic. Positive for temporal wasting Respiratory:  . No increased work of breathing. . Improved aeration especially on the right middle and lower lobes. . Diminished breath sounds throughout. . No wheezes, rales, or rhonchi . No tactile fremitus Cardiovascular:  . Regular rate and rhythm . No murmurs, ectopy, or gallups. . No lateral PMI. No thrills. Abdomen:  . Abdomen is soft, non-tender, non-distended . No hernias, masses, or organomegaly . Normoactive bowel sounds.  Nicole [name] Musculoskeletal:  . No cyanosis, clubbing, or edema . Cachectic Skin:  . No rashes, lesions, ulcers . palpation of skin: no induration or nodules Neurologic:  . CN 2-12 intact . Sensation all 4 extremities intact Psychiatric:  . Mental status o Mood, affect  appropriate o Orientation to person, place, time  . judgment and insight appear intact  I have personally reviewed the following:   Today's Data  . Vitals, CBC, BMP  Micro Data  . Urine culture positive for E. Coli . Blood culture x 2 have had no growth  Imaging  . CXR . CXR 2 view . CT Chest  Cardiology Data  . EKG  Scheduled Meds: . carvedilol  12.5 mg Oral BID WC  . enoxaparin (LOVENOX) injection  40 mg Subcutaneous Q24H  . feeding supplement  237 mL Oral BID BM  . lisinopril  10 mg Oral Daily  . multivitamin with minerals  1 tablet Oral Daily  . nicotine  14 mg Transdermal Daily  . sodium chloride  flush  3 mL Intravenous Q12H   Continuous Infusions: . sodium chloride Stopped (09/03/20 2208)  . sodium chloride Stopped (09/04/20 0434)  . ampicillin-sulbactam (UNASYN) IV 3 g (09/04/20 1502)  . vancomycin 750 mg (09/04/20 1350)    Principal Problem:   Pleural effusion on right Active Problems:   Hypertension   Collagen vascular disease (HCC)   Hyponatremia   Acute lower UTI   Nicotine dependence   Protein-calorie malnutrition, severe   LOS: 4 days   A & P   Right-sided hydropneumothorax: Likely parapneumonic in etiology. Possible associated empyema. However, there has been fair resolution of the fluid collection with the pigtail catheter. Per CTS at [facility] no need for transfer for decortication at this point. Fluid analysis is pending. Repeat CT chest has demonstrated right empyema persists but has significantly improved. There are probable reactive nodes in the precarinal region and right infrahilar region. Mild opacities in the bases are worrisome for a mild stable infectious process. There is emphysema, a 28mm nodule that is stable in the left base that is unchanged from 2010. Surgery feels that the effusino is much improved. They have put the tube back to suction and hope that it can be removed tomorrow.  Community-acquired pneumonia: Due to likely anaerobic  bacterial source, antibiotic choice has been changed from rocephin and azithromycin to Unasyn WBC is resolving. No fevers. Blood cultures x 2 have had no growth.  UTI: Due to E coli: The patient is receiving Unasyn. Monitor sensitivities.  Nicotine dependence: Noted. Nicotine patch available at 14 mg dose.  Smoking cessation has been discussed with patient in detail.  Hyponatremia: Resolved with IV fluids. Due to hypovolemia. Monitor.   Severe Protein Calorie Malnutrition: evidenced by muscle wasting at temples, cachexia, low albumin, and scaffoid abdominal muscles. Nutrition consulted and supplements ordered.  I have seen and examined this patient myself. I have spent 46 minutes in her evaluation and care. More than 50% of this was spent in coordination of care with surgery and CTS.  DVT prophylaxis: Lovenox Code Status: full code Family Communication: None available Disposition Plan:  Status is: Inpatient  Remains inpatient appropriate because:IV treatments appropriate due to intensity of illness or inability to take PO   Dispo: The patient is from: Home              Anticipated d/c is to: Home              Patient currently is not medically stable to d/c.   Difficult to place patient No  Neilan Rizzo, DO [facility] Direct contact: see [email or web address]  7PM-7AM contact night coverage as above 09/04/2020, 3:17 PM  LOS: 2 days

## 2020-09-04 NOTE — Progress Notes (Signed)
Subjective:  CC: Nicole [name] is a 60 y.o. female  Hospital stay day 4,   empyema  HPI: No acute issues overnight  ROS:  General: Denies weight loss, weight gain, fatigue, fevers, chills, and night sweats. Heart: Denies chest pain, palpitations, racing heart, irregular heartbeat, leg pain or swelling, and decreased activity tolerance. Respiratory: Denies breathing difficulty, shortness of breath, wheezing, cough, and sputum. GI: Denies change in appetite, heartburn, nausea, vomiting, constipation, diarrhea, and blood in stool. GU: Denies difficulty urinating, pain with urinating, urgency, frequency, blood in urine.   Objective:   Temp:  [97.5 F (36.4 C)-97.8 F (36.6 C)] 97.5 F (36.4 C) (03/06 0729) Pulse Rate:  [60-79] 79 (03/06 0729) Resp:  [13-20] 20 (03/06 0729) BP: (147-184)/(90-107) 148/97 (03/06 0729) SpO2:  [97 %-100 %] 98 % (03/06 0729)     Height: 5\' 3"  (160 cm) Weight: 44.1 kg BMI (Calculated): 17.23   Intake/Output this shift:   Intake/Output Summary (Last 24 hours) at 09/04/2020 0820 Last data filed at 09/04/2020 0504 Gross per 24 hour  Intake 987.1 ml  Output 50 ml  Net 937.1 ml    Constitutional :  alert, cooperative, appears stated age and no distress  Respiratory:  clear to auscultation bilaterally. Chest tube with straw colored output with scant purulent drainage in tube  Cardiovascular:  regular rate and rhythm  Gastrointestinal: soft, non-tender; bowel sounds normal; no masses,  no organomegaly.   Skin: Cool and moist.   Psychiatric: Normal affect, non-agitated, not confused       LABS:  CMP Latest Ref Rng & Units 09/04/2020 09/01/2020 08/31/2020  Glucose 70 - 99 mg/dL 10/31/2020) 948(N) 462(V)  BUN 6 - 20 mg/dL 10 13 10   Creatinine 0.44 - 1.00 mg/dL 035(K ) 0.93)  Sodium 135 - 145 mmol/L 136 133(L) 128(L)  Potassium 3.5 - 5.1 mmol/L 4.3 3.7 3.7  Chloride 98 - 111 mmol/L 106 106 97(L)  CO2 22 - 32 mmol/L 23 21(L) 22  Calcium 8.9 - 10.3 mg/dL  9.3 8.18(E) 9.0  Total Protein 6.5 - 8.1 g/dL 7.4 - -  Total Bilirubin 0.3 - 1.2 mg/dL 0.7 - -  Alkaline Phos 38 - 126 U/L 111 - -  AST 15 - 41 U/L 42(H) - -  ALT 0 - 44 U/L 21 - -   CBC Latest Ref Rng & Units 09/04/2020 09/01/2020 08/31/2020  WBC 4.0 - 10.5 K/uL 7.0 5.9 10.7(H)  Hemoglobin 12.0 - 15.0 g/dL 11.6(L) 10.4(L) 11.1(L)  Hematocrit 36.0 - 46.0 % 34.1(L) 31.3(L) 32.9(L)  Platelets 150 - 400 K/uL 419(H) 372 463(H)    RADS: CLINICAL DATA:  Respiratory illness. Nondiagnostic x-ray. Follow-up right suspected empyema.  EXAM: CT CHEST WITH CONTRAST  TECHNIQUE: Multidetector CT imaging of the chest was performed during intravenous contrast administration.  CONTRAST:  53mL OMNIPAQUE IOHEXOL 300 MG/ML  SOLN  COMPARISON:  Chest x-ray September 02, 2020.  Chest CT August 31, 2020.  FINDINGS: Cardiovascular: Atherosclerotic change seen in the thoracic aorta without dissection. The ascending thoracic aorta is mildly dilated measuring up to 4.2 cm near its root. Calcified atherosclerosis in the LAD. The heart is otherwise normal in appearance. Central pulmonary arteries are poorly opacified normal in caliber.  Mediastinum/Nodes: No left-sided pleural effusion. The thick walled fluid collection containing air on the comparison CT scan is partially collapsed, now containing a chest tube located within the fluid. There has been significant improvement in the size of the suspected empyema. No pericardial effusion. The chest wall is  normal. The thyroid and esophagus are normal. Shotty nodes in the precarinal region are likely reactive. Shotty nodes in the right inferior hilum are likely reactive.  Lungs/Pleura: Central airways are normal. Other than the air in the right remaining pleural collection, no air seen in the pleural spaces. Emphysematous changes are noted. Subpleural reticulations are stable. The left lower lobe 6 mm pulmonary nodule is without significant change. No new  nodules or masses. No new infiltrates. Mild tree-in-bud opacity in the right lateral lung base remains. Mild opacity in the left lateral lung base remains.  Upper Abdomen: No interval changes or acute abnormalities.  Musculoskeletal: No chest wall abnormality. No acute or significant osseous findings.  IMPRESSION: 1. The suspected right empyema persists but has significantly improved, much smaller in size, in the interval. 2. Probable reactive nodes in the precarinal region and right infrahilar region. 3. Mild opacities in the bases worrisome for a mild stable infectious process. 4. Mild emphysema. 5. 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 6. Stable 6 mm nodule in the left base, unchanged since 2010 based on the previous CT report, consistent with a benign nodule. 7. Mild atherosclerotic changes in the thoracic aorta. Coronary artery disease.  Aortic Atherosclerosis (ICD10-I70.0) and Emphysema (ICD10-J43.9).   Electronically Signed   By: Gerome Sam III M.D   On: 09/03/2020 09:23 Assessment:   No drainage recorded from tube in system, but box shows about 20 additional ml since last check.   placed the chest tube back on suction to see if we can get the remaining empyema noted on CT drained completely.  will reassess in am.

## 2020-09-05 ENCOUNTER — Inpatient Hospital Stay: Payer: Self-pay

## 2020-09-05 DIAGNOSIS — J869 Pyothorax without fistula: Secondary | ICD-10-CM

## 2020-09-05 DIAGNOSIS — B182 Chronic viral hepatitis C: Secondary | ICD-10-CM

## 2020-09-05 DIAGNOSIS — B9562 Methicillin resistant Staphylococcus aureus infection as the cause of diseases classified elsewhere: Secondary | ICD-10-CM

## 2020-09-05 DIAGNOSIS — A4902 Methicillin resistant Staphylococcus aureus infection, unspecified site: Secondary | ICD-10-CM

## 2020-09-05 DIAGNOSIS — I10 Essential (primary) hypertension: Secondary | ICD-10-CM

## 2020-09-05 DIAGNOSIS — E43 Unspecified severe protein-calorie malnutrition: Secondary | ICD-10-CM

## 2020-09-05 HISTORY — DX: Pyothorax without fistula: J86.9

## 2020-09-05 LAB — CREATININE, SERUM
Creatinine, Ser: 0.97 mg/dL (ref 0.44–1.00)
GFR, Estimated: >60 mL/min (ref >60)

## 2020-09-05 LAB — CULTURE, BLOOD (ROUTINE X 2)
Culture: NO GROWTH
Culture: NO GROWTH

## 2020-09-05 LAB — AEROBIC/ANAEROBIC CULTURE W GRAM STAIN (SURGICAL/DEEP WOUND)

## 2020-09-05 MED ORDER — AMLODIPINE BESYLATE 10 MG PO TABS
10.0000 mg | ORAL_TABLET | Freq: Every day | ORAL | Status: DC
  Administered 2020-09-05 – 2020-09-06 (×2): 10 mg via ORAL
  Filled 2020-09-05 (×2): qty 1

## 2020-09-05 MED ORDER — ENOXAPARIN SODIUM 30 MG/0.3ML ~~LOC~~ SOLN
30.0000 mg | SUBCUTANEOUS | Status: DC
  Filled 2020-09-05: qty 0.3

## 2020-09-05 MED ORDER — LORAZEPAM 1 MG PO TABS
1.0000 mg | ORAL_TABLET | Freq: Four times a day (QID) | ORAL | Status: DC | PRN
  Administered 2020-09-05 – 2020-09-06 (×4): 1 mg via ORAL
  Filled 2020-09-05 (×4): qty 1

## 2020-09-05 NOTE — Progress Notes (Signed)
[facility] SURGICAL PROGRESS NOTE (cpt [phone])  Hospital Day(s): 5.   Interval History: Patient seen and examined, no acute events or new complaints overnight. Patient reports she continues to feel better, denies fever, chills, cough, CP, SOB. Chest tube with 30 ccs out in the last 24 hours; this appears more serous but there is still 'sediment' and seropurulent drainage in the tubing. Her CXR this morning remains stable with just small right pleural effusion. Her Cx from 03/02 did grow out MRSA and she was switched to Unasyn and Vancomycin.   Review of Systems:  Constitutional: denies fever, chills  HEENT: denies cough or congestion  Respiratory: denies any shortness of breath  Cardiovascular: denies chest pain or palpitations  Gastrointestinal: denies abdominal pain, N/V, or diarrhea/and bowel function as per interval history Genitourinary: denies burning with urination or urinary frequency  Vital signs in last 24 hours: [min-max] current  Temp:  [97.5 F (36.4 C)-97.9 F (36.6 C)] 97.9 F (36.6 C) (03/07 0439) Pulse Rate:  [72-79] 77 (03/07 0439) Resp:  [17-20] 18 (03/07 0439) BP: (148-168)/(94-104) 153/104 (03/07 0439) SpO2:  [97 %-100 %] 97 % (03/07 0439)     Height: 5\' 3"  (160 cm) Weight: 44.1 kg BMI (Calculated): 17.23   Intake/Output last 2 shifts:  03/06 0701 - 03/07 0700 In: 715 [P.O.:240; I.V.:22; IV Piggyback:453] Out: 30 [Chest Tube:30]   Physical Exam:  Constitutional: alert, cooperative and no distress  HENT: normocephalic without obvious abnormality  Eyes: PERRL, EOM's grossly intact and symmetric  Respiratory: breathing non-labored at rest, decrease breath sounds on right Cardiovascular: regular rate and sinus rhythm Chest: Chest tube to the right lateral chest wall, site CDI, no air leak, output slowing and more serous; still with some sediment in chest tubing, I did flush this with 20 ccs NS   Labs:  CBC Latest Ref Rng & Units 09/04/2020  09/01/2020 08/31/2020  WBC 4.0 - 10.5 K/uL 7.0 5.9 10.7(H)  Hemoglobin 12.0 - 15.0 g/dL 11.6(L) 10.4(L) 11.1(L)  Hematocrit 36.0 - 46.0 % 34.1(L) 31.3(L) 32.9(L)  Platelets 150 - 400 K/uL 419(H) 372 463(H)   CMP Latest Ref Rng & Units 09/05/2020 09/04/2020 09/01/2020  Glucose 70 - 99 mg/dL - 11/01/2020) 035(W)  BUN 6 - 20 mg/dL - 10 13  Creatinine 656(C - 1.00 mg/dL 1.27 5.17 0.01)  Sodium 135 - 145 mmol/L - 136 133(L)  Potassium 3.5 - 5.1 mmol/L - 4.3 3.7  Chloride 98 - 111 mmol/L - 106 106  CO2 22 - 32 mmol/L - 23 21(L)  Calcium 8.9 - 10.3 mg/dL - 9.3 7.49(S)  Total Protein 6.5 - 8.1 g/dL - 7.4 -  Total Bilirubin 0.3 - 1.2 mg/dL - 0.7 -  Alkaline Phos 38 - 126 U/L - 111 -  AST 15 - 41 U/L - 42(H) -  ALT 0 - 44 U/L - 21 -     Imaging studies:   CXR (09/05/2020) personally reviewed showing stable chest tube positioning, small right pleural effusion, overall unchanged from previous, and radiologist report reviewed:  IMPRESSION: 1. Stable positioning of support apparatus. No pneumothorax. 2. Unchanged small right-sided effusion and associated right basilar infiltrates.   Assessment/Plan: (ICD-10's: J86.9) 60 y.o. female with clinically resolving large right sided hydropneumothorax, likely empyemas/p chest tube placement on 03/02, complicated by pertinent comorbidities includingHTN, Hep C, COPD, and tobacco abuse.   - Will leave CT to suction today given seropurulent appearance to drainage. This does appear to be slowing significantly. I will plan on repeat  CXR in the AM. If her output remains low, appears more serous, and her CXR remains improved, I think we can plan to remove chest tube tomorrow.   - Agree IV ABx (unasyn + Vancomycin); may need to involve ID; she will benefit from outpatient follow up at a minimum  - Pulmonary toilet; IS - Mobilization as tolerated - Pain control prn - Further management per primary service; we will follow     All of the above findings and recommendations were discussed with the patient, and the medical team, and all of patient's questions were answered to her expressed satisfaction.  -- Lynden Oxford, PA-C [facility] 09/05/2020, 7:29 AM [phone] M-F: 7am - 4pm

## 2020-09-05 NOTE — Consult Note (Signed)
NAME: Nicole [name]  DOB: 01-Aug-1960  MRN: [id number]  Date/Time: 09/05/2020 4:07 PM  REQUESTING PROVIDER: Dr. Chipper Herb Subjective:  REASON FOR CONSULT: MRSA empyema ? Nicole [name] is a 60 y.o. female with a history of COPD, current daily smoker, hypertension, bipolar disorder on no medication presented to the hospital on 08/31/2020 with nearly 6 weeks of  cough, purulent sputum and shortness of breath.  She also complained of a weight loss of nearly 12 pounds.  She was also complaining of pain on the right side of the chest.  She also had night sweats.  Patient says she has had pneumonia at least 3 times before. In the ED BP 92/61, temp 97.7, heart rate 71, RR 20, sats 97%. WBC 10.7, Hb 11.1, platelet 463, creatinine 1.05.  CT chest with contrast showed a large complex posterior loculated hydropneumothorax on the right side.  The air-fluid level posteriorly in the pleural space measures 17.5 to 8.3 cm into 9.2 cm and has thick irregular wall suspicious for empyema.  Patient was put on vancomycin and  Unasyn.  Surgery was consulted.  On 08/31/2020 IR placed pigtail catheter in the hydropneumothorax.  Culture was MRSA.  Repeat CT chest done on 09/03/2020 showed some improvement of the right-sided effusion. Today patient had another chest x-ray which showed small right-sided effusion and also a right basilar infiltrate. I am asked to see the patient for further antibiotic management.   Past Medical History:  Diagnosis Date  . Anxiety   . Asthma   . Collagen vascular disease (HCC)   . COPD (chronic obstructive pulmonary disease) (HCC)   . Hepatitis C   . Hypertension     Past Surgical History:  Procedure Laterality Date  . ABDOMINAL HYSTERECTOMY    . FOOT SURGERY      Social History   Socioeconomic History  . Marital status: Married    Spouse name: Not on file  . Number of children: Not on file  . Years of education: Not on file  . Highest education level: Not on file   Occupational History  . Not on file  Tobacco Use  . Smoking status: Current Every Day Smoker    Packs/day: 1.00    Types: Cigarettes  . Smokeless tobacco: Never Used  Substance and Sexual Activity  . Alcohol use: Yes  . Drug use: No  . Sexual activity: Not on file  Other Topics Concern  . Not on file  Social History Narrative  . Not on file   Social Determinants of Health   Financial Resource Strain: Not on file  Food Insecurity: Not on file  Transportation Needs: Not on file  Physical Activity: Not on file  Stress: Not on file  Social Connections: Not on file  Intimate Partner Violence: Not on file    Family History  Problem Relation Age of Onset  . Rheum arthritis Other   . Osteoporosis Other   . Clotting disorder Other   . Stroke Other    No Known Allergies I? Current Facility-Administered Medications  Medication Dose Route Frequency Provider Last Rate Last Admin  . 0.9 %  sodium chloride infusion  250 mL Intravenous PRN Lucile Shutters, MD   Stopped at 09/03/20 2208  . 0.9 %  sodium chloride infusion   Intravenous PRN Swayze, Ava, DO   Stopped at 09/05/20 1427  . acetaminophen (TYLENOL) tablet 650 mg  650 mg Oral Q6H PRN Agbata, Tochukwu, MD       Or  .  acetaminophen (TYLENOL) suppository 650 mg  650 mg Rectal Q6H PRN Agbata, Tochukwu, MD      . amLODipine (NORVASC) tablet 10 mg  10 mg Oral Daily Marrion CoyZhang, Dekui, MD   10 mg at 09/05/20 1434  . Ampicillin-Sulbactam (UNASYN) 3 g in sodium chloride 0.9 % 100 mL IVPB  3 g Intravenous Q6H Swayze, Ava, DO 200 mL/hr at 09/05/20 1435 Infusion Verify at 09/05/20 1435  . carvedilol (COREG) tablet 12.5 mg  12.5 mg Oral BID WC Agbata, Tochukwu, MD   12.5 mg at 09/05/20 1008  . enoxaparin (LOVENOX) injection 30 mg  30 mg Subcutaneous Q24H Tressie EllisChappell, Alex B, RPH      . feeding supplement (ENSURE ENLIVE / ENSURE PLUS) liquid 237 mL  237 mL Oral BID BM Swayze, Ava, DO   237 mL at 09/05/20 1435  . HYDROcodone-acetaminophen  (NORCO/VICODIN) 5-325 MG per tablet 1 tablet  1 tablet Oral Q4H PRN Mansy, Vernetta HoneyJan A, MD   1 tablet at 09/05/20 1421  . lisinopril (ZESTRIL) tablet 10 mg  10 mg Oral Daily Agbata, Tochukwu, MD   10 mg at 09/05/20 1008  . LORazepam (ATIVAN) tablet 1 mg  1 mg Oral Q6H PRN Marrion CoyZhang, Dekui, MD   1 mg at 09/05/20 1211  . multivitamin with minerals tablet 1 tablet  1 tablet Oral Daily Agbata, Tochukwu, MD   1 tablet at 09/05/20 1009  . nicotine (NICODERM CQ - dosed in mg/24 hours) patch 14 mg  14 mg Transdermal Daily Agbata, Tochukwu, MD   14 mg at 09/05/20 1009  . ondansetron (ZOFRAN) tablet 4 mg  4 mg Oral Q6H PRN Agbata, Tochukwu, MD       Or  . ondansetron (ZOFRAN) injection 4 mg  4 mg Intravenous Q6H PRN Agbata, Tochukwu, MD      . sodium chloride flush (NS) 0.9 % injection 3 mL  3 mL Intravenous Q12H Agbata, Tochukwu, MD   3 mL at 09/05/20 1012  . sodium chloride flush (NS) 0.9 % injection 3 mL  3 mL Intravenous PRN Agbata, Tochukwu, MD      . traZODone (DESYREL) tablet 50 mg  50 mg Oral QHS PRN Jimmye Normanuma, Elizabeth Achieng, NP   50 mg at 09/05/20 0139  . vancomycin (VANCOREADY) IVPB 750 mg/150 mL  750 mg Intravenous Q24H Lowella BandyGrubb, Rodney D, [facility]   Stopped at 09/05/20 1317     Abtx:  Anti-infectives (From admission, onward)   Start     Dose/Rate Route Frequency Ordered Stop   09/04/20 1400  vancomycin (VANCOREADY) IVPB 500 mg/100 mL  Status:  Discontinued        500 mg 100 mL/hr over 60 Minutes Intravenous Every 24 hours 09/03/20 1441 09/04/20 1005   09/04/20 1200  vancomycin (VANCOREADY) IVPB 750 mg/150 mL        750 mg 150 mL/hr over 60 Minutes Intravenous Every 24 hours 09/04/20 1005     09/03/20 1600  vancomycin (VANCOREADY) IVPB 750 mg/150 mL        750 mg 150 mL/hr over 60 Minutes Intravenous  Once 09/03/20 1441 09/03/20 1748   09/02/20 1000  azithromycin (ZITHROMAX) 500 mg in sodium chloride 0.9 % 250 mL IVPB  Status:  Discontinued        500 mg 250 mL/hr over 60 Minutes Intravenous Every 24 hours  09/01/20 1335 09/01/20 1358   09/01/20 1500  Ampicillin-Sulbactam (UNASYN) 3 g in sodium chloride 0.9 % 100 mL IVPB        3 g  200 mL/hr over 30 Minutes Intravenous Every 6 hours 09/01/20 1358     09/01/20 1000  azithromycin (ZITHROMAX) 500 mg in sodium chloride 0.9 % 250 mL IVPB        500 mg 250 mL/hr over 60 Minutes Intravenous  Once 08/31/20 1358 09/01/20 1257   08/31/20 1500  cefTRIAXone (ROCEPHIN) 2 g in sodium chloride 0.9 % 100 mL IVPB  Status:  Discontinued        2 g 200 mL/hr over 30 Minutes Intravenous Every 24 hours 08/31/20 1358 09/01/20 1358   08/31/20 1400  cefTRIAXone (ROCEPHIN) 1 g in sodium chloride 0.9 % 100 mL IVPB  Status:  Discontinued        1 g 200 mL/hr over 30 Minutes Intravenous Every 24 hours 08/31/20 1355 08/31/20 1358   08/31/20 0730  vancomycin (VANCOCIN) IVPB 1000 mg/200 mL premix        1,000 mg 200 mL/hr over 60 Minutes Intravenous  Once 08/31/20 0719 08/31/20 1246   08/31/20 0730  ceFEPIme (MAXIPIME) 2 g in sodium chloride 0.9 % 100 mL IVPB        2 g 200 mL/hr over 30 Minutes Intravenous  Once 08/31/20 0719 08/31/20 0945   08/31/20 0730  azithromycin (ZITHROMAX) 500 mg in sodium chloride 0.9 % 250 mL IVPB        500 mg 250 mL/hr over 60 Minutes Intravenous  Once 08/31/20 0719 08/31/20 1116      REVIEW OF SYSTEMS:  Const:  fever, night sweats,  weight loss Eyes: negative diplopia or visual changes, negative eye pain ENT: negative coryza, negative sore throat Respcough, purulent sputum, dyspnea Cards: Right-sided chest pain, negative for palpitations, lower extremity edema GU: negative for frequency, dysuria and hematuria GI: Negative for abdominal pain, diarrhea, bleeding, constipation Skin: negative for rash and pruritus Heme: negative for easy bruising and gum/nose bleeding MS: Generalized weakness Neurolo:negative for headaches, dizziness, vertigo, memory problems  Psych: negative for feelings of anxiety, depression  Endocrine: negative for  thyroid, diabetes Allergy/Immunology- negative for any medication or food allergies Current everyday smoker. 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  Objective:  VITALS:  BP (!) 172/105 (BP Location: Left Arm)   Pulse 80   Temp 98.4 F (36.9 C)   Resp 18   Ht 5\' 3"  (1.6 m)   Wt 44.1 kg   SpO2 99%   BMI 17.22 kg/m  PHYSICAL EXAM:  General: Alert, cooperative, no distress, emaciated Head: Normocephalic, without obvious abnormality, atraumatic. Eyes: Conjunctivae clear, anicteric sclerae. Pupils are equal ENT Nares normal. No drainage or sinus tenderness. Lips, mucosa, and tongue normal. No Thrush Edentulous Neck: Supple, symmetrical, no adenopathy, thyroid: non tender no carotid bruit and no JVD. Back: No CVA tenderness. Lungs: Bilateral air entry.  Decreased right base.  Pigtail catheter present on the right side.  Heart: S1-S2.  Abdomen: Soft, non-tender,not distended. Bowel sounds normal. No masses Extremities: atraumatic, no cyanosis. No edema. No clubbing Skin: No rashes or lesions. Or bruising Lymph: Cervical, supraclavicular normal. Neurologic: Grossly non-focal Pertinent Labs Lab Results CBC    Component Value Date/Time   WBC 7.0 09/04/2020 0513   RBC 3.61 (L) 09/04/2020 0513   HGB 11.6 (L) 09/04/2020 0513   HCT 34.1 (L) 09/04/2020 0513   PLT 419 (H) 09/04/2020 0513   MCV 94.5 09/04/2020 0513   MCH 32.1 09/04/2020 0513   MCHC 34.0 09/04/2020 0513   RDW 15.7 (H) 09/04/2020 0513   LYMPHSABS 2.6 09/04/2020  0513   MONOABS 0.6 09/04/2020 0513   EOSABS 0.1 09/04/2020 0513   BASOSABS 0.1 09/04/2020 0513    CMP Latest Ref Rng & Units 09/05/2020 09/04/2020 09/01/2020  Glucose 70 - 99 mg/dL - 297(L) 892(J)  BUN 6 - 20 mg/dL - 10 13  Creatinine 1.94 - 1.00 mg/dL 1.74 0.81 4.48(J)  Sodium 135 - 145 mmol/L - 136 133(L)  Potassium 3.5 - 5.1 mmol/L - 4.3 3.7  Chloride 98 - 111 mmol/L - 106 106  CO2 22 - 32 mmol/L - 23 21(L)  Calcium 8.9 - 10.3 mg/dL - 9.3  8.5(U)  Total Protein 6.5 - 8.1 g/dL - 7.4 -  Total Bilirubin 0.3 - 1.2 mg/dL - 0.7 -  Alkaline Phos 38 - 126 U/L - 111 -  AST 15 - 41 U/L - 42(H) -  ALT 0 - 44 U/L - 21 -      Microbiology: Recent Results (from the past 240 hour(s))  Resp Panel by RT-PCR (Flu A&B, Covid) Nasopharyngeal Swab     Status: None   Collection Time: 08/31/20  8:47 AM   Specimen: Nasopharyngeal Swab; Nasopharyngeal(NP) swabs in vial transport medium  Result Value Ref Range Status   SARS Coronavirus 2 by RT PCR NEGATIVE NEGATIVE Final    Comment: (NOTE) SARS-CoV-2 target nucleic acids are NOT DETECTED.  The SARS-CoV-2 RNA is generally detectable in upper respiratory specimens during the acute phase of infection. The lowest concentration of SARS-CoV-2 viral copies this assay can detect is 138 copies/mL. A negative result does not preclude SARS-Cov-2 infection and should not be used as the sole basis for treatment or other patient management decisions. A negative result may occur with  improper specimen collection/handling, submission of specimen other than nasopharyngeal swab, presence of viral mutation(s) within the areas targeted by this assay, and inadequate number of viral copies(<138 copies/mL). A negative result must be combined with clinical observations, patient history, and epidemiological information. The expected result is Negative.  Fact Sheet for Patients:  BloggerCourse.com  Fact Sheet for Healthcare Providers:  SeriousBroker.it  This test is no t yet approved or cleared by the [place] FDA and  has been authorized for detection and/or diagnosis of SARS-CoV-2 by FDA under an Emergency Use Authorization (EUA). This EUA will remain  in effect (meaning this test can be used) for the duration of the COVID-19 declaration under Section 564(b)(1) of the Act, 21 U.S.C.section 360bbb-3(b)(1), unless the authorization is terminated  or  revoked sooner.       Influenza A by PCR NEGATIVE NEGATIVE Final   Influenza B by PCR NEGATIVE NEGATIVE Final    Comment: (NOTE) The Xpert Xpress SARS-CoV-2/FLU/RSV plus assay is intended as an aid in the diagnosis of influenza from Nasopharyngeal swab specimens and should not be used as a sole basis for treatment. Nasal washings and aspirates are unacceptable for Xpert Xpress SARS-CoV-2/FLU/RSV testing.  Fact Sheet for Patients: BloggerCourse.com  Fact Sheet for Healthcare Providers: SeriousBroker.it  This test is not yet approved or cleared by the [place] FDA and has been authorized for detection and/or diagnosis of SARS-CoV-2 by FDA under an Emergency Use Authorization (EUA). This EUA will remain in effect (meaning this test can be used) for the duration of the COVID-19 declaration under Section 564(b)(1) of the Act, 21 U.S.C. section 360bbb-3(b)(1), unless the authorization is terminated or revoked.  Performed at [facility], [address], [place], [place] [address]   Blood Culture (routine x 2)  Status: None   Collection Time: 08/31/20  8:47 AM   Specimen: BLOOD  Result Value Ref Range Status   Specimen Description BLOOD LEFT ARM  Final   Special Requests   Final    BOTTLES DRAWN AEROBIC AND ANAEROBIC Blood Culture results may not be optimal due to an excessive volume of blood received in culture bottles   Culture   Final    NO GROWTH 5 DAYS Performed at [facility], [address] [place], [place] [address]    Report Status 09/05/2020 FINAL  Final  Blood Culture (routine x 2)     Status: None   Collection Time: 08/31/20  8:47 AM   Specimen: BLOOD  Result Value Ref Range Status   Specimen Description BLOOD RIGHT ARM  Final   Special Requests   Final    BOTTLES DRAWN AEROBIC AND ANAEROBIC Blood Culture results may not be optimal due to an excessive volume of blood received in culture  bottles   Culture   Final    NO GROWTH 5 DAYS Performed at [facility], [address] [place], [place] [address]    Report Status 09/05/2020 FINAL  Final  Urine culture     Status: Abnormal   Collection Time: 08/31/20  8:47 AM   Specimen: In/Out Cath Urine  Result Value Ref Range Status   Specimen Description   Final    IN/OUT CATH URINE Performed at [facility], [address] [place], [place] [address]    Special Requests   Final    NONE Performed at [facility], [address] [place], [place] [address]    Culture 60,000 COLONIES/mL ESCHERICHIA COLI (A)  Final   Report Status 09/02/2020 FINAL  Final   Organism ID, Bacteria ESCHERICHIA COLI (A)  Final      Susceptibility   Escherichia coli - MIC*    AMPICILLIN 8 SENSITIVE Sensitive     CEFAZOLIN <=4 SENSITIVE Sensitive     CEFEPIME <=0.12 SENSITIVE Sensitive     CEFTRIAXONE <=0.25 SENSITIVE Sensitive     CIPROFLOXACIN <=0.25 SENSITIVE Sensitive     GENTAMICIN <=1 SENSITIVE Sensitive     IMIPENEM <=0.25 SENSITIVE Sensitive     NITROFURANTOIN <=16 SENSITIVE Sensitive     TRIMETH/SULFA <=20 SENSITIVE Sensitive     AMPICILLIN/SULBACTAM <=2 SENSITIVE Sensitive     PIP/TAZO <=4 SENSITIVE Sensitive     * 60,000 COLONIES/mL ESCHERICHIA COLI  Aerobic/Anaerobic Culture w Gram Stain (surgical/deep wound)     Status: None   Collection Time: 08/31/20  2:51 PM   Specimen: PATH Cytology Pleural fluid  Result Value Ref Range Status   Specimen Description   Final    PLEURAL Performed at [facility], [address], [place], [place] [address]    Special Requests   Final    NONE Performed at [facility], [address] [place], [place] [address]    Gram Stain   Final    ABUNDANT WBC PRESENT, PREDOMINANTLY PMN FEW GRAM POSITIVE COCCI IN PAIRS IN CLUSTERS    Culture   Final    MODERATE METHICILLIN RESISTANT STAPHYLOCOCCUS AUREUS NO ANAEROBES ISOLATED Performed at [facility], [address], [place], [place] [address]    Report Status 09/05/2020 FINAL  Final   Organism ID, Bacteria METHICILLIN RESISTANT STAPHYLOCOCCUS AUREUS  Final      Susceptibility   Methicillin resistant staphylococcus aureus - MIC*    CIPROFLOXACIN >=8 RESISTANT Resistant  ERYTHROMYCIN >=8 RESISTANT Resistant     GENTAMICIN <=0.5 SENSITIVE Sensitive     OXACILLIN >=4 RESISTANT Resistant     TETRACYCLINE <=1 SENSITIVE Sensitive     VANCOMYCIN 1 SENSITIVE Sensitive     TRIMETH/SULFA <=10 SENSITIVE Sensitive     CLINDAMYCIN >=8 RESISTANT Resistant     RIFAMPIN <=0.5 SENSITIVE Sensitive     Inducible Clindamycin NEGATIVE Sensitive     * MODERATE METHICILLIN RESISTANT STAPHYLOCOCCUS AUREUS    IMAGING RESULTS:  I have personally reviewed the films ? Impression/Recommendation ? MRSA empyema. Patient has a pigtail catheter. She would need decortication as risk of recurrence is highly likely once the catheter is removed. Currently on vancomycin We will change to linezolid. We will also get a 2D echo to make sure there is no endocarditis. ? While on linezolid she should not be given any SSRI.  Hypertension: Was not on taking any medications on admission but currently on lisinopril  History of bipolar disorder.  Hepatitis C.  Unclear what her status is.  We will check some labs tomorrow. ___________________________________________________ Discussed with patient and care team. Note:  This document was prepared using Dragon voice recognition software and may include unintentional dictation errors.

## 2020-09-05 NOTE — Progress Notes (Signed)
PROGRESS NOTE    Nicole [name]  OZH:[id number] DOB: [birth year] DOA: 08/31/2020 PCP: [facility]   CC: Shortness of breath Brief Narrative:  The patient is a 60 yr old woman who presented to [facility] ED on 08/31/2020 with complaints of increasing shortness of breath, pleuritic chest pain, and cough productive of purulent sputum. CXR in the ED demonstrated a large loculated right hydropneumothorax. With a mild left basilar pulmonary infiltrate due to infection vs aspiration. CT of the chest with contrast also demonstrated this hydropneumothorax on the right with irregular thick walls that was suspicious for empyema. There was also associated compressive right lower lobe atelectasis. There were scattered subpleural reticulation and ground glass opacities within the aerated portion of the lungs. There was a 6 mm left lower lobe pulmonary nodule that was minimally enlarged since 2010 likely benign. There was also emphysema and AAA 4.2 cm.  Chest tube was placed by interventional radiology and surgery was consulted.  Repeat chest CT scan on 3/5 showed improvement of emphysema.  The patient has been treated with vancomycin and Unasyn.  Assessment & Plan:   Principal Problem:   Pleural effusion on right Active Problems:   Hypertension   Collagen vascular disease (HCC)   Hyponatremia   Acute lower UTI   Nicotine dependence   Protein-calorie malnutrition, severe  #1.  Right-sided empyema with hydropneumothorax due to MRSA Right lower lobe community-acquired pneumonia due to MRSA. Status post chest tube placement. Culture came back with MRSA. Patient currently covered with Unasyn and vancomycin. We will obtain ID consult, probably transition to single antibiotic to cover MRSA, and to decide the length of antibiotic treatment. Patient is status post chest tube placement, planning to remove chest tube tomorrow.  #2.  E. coli UTI. Currently with Unasyn.  May discontinue  if no longer needed for empyema.  #3.  Hyponatremia Condition improved  4.  Severe protein calorie malnutrition. Continue supplement.  5. Reactive thrombocytosis. Improved.      DVT prophylaxis: Lovenox Code Status: Full Family Communication:  Disposition Plan:  .   Status is: Inpatient  Remains inpatient appropriate because:Inpatient level of care appropriate due to severity of illness   Dispo: The patient is from: Home              Anticipated d/c is to: Home              Patient currently is not medically stable to d/c.   Difficult to place patient No        I/O last 3 completed shifts: In: 1222.1 [P.O.:240; I.V.:30; IV Piggyback:952.1] Out: 30 [Chest Tube:30] Total I/O In: 260 [P.O.:260] Out: 0      Consultants:   ID, General surgery  Procedures: chest tube  Antimicrobials:  Vancomycin and Unasyn.  Subjective: Patient feels better, no chest pain or shortness of breath. She has significant anxiety. No abdominal pain nausea vomiting.  No diarrhea constipation. No fever or chills.   Objective: Vitals:   09/04/20 1552 09/04/20 1958 09/05/20 0439 09/05/20 0741  BP: (!) 149/94 (!) 168/97 (!) 153/104 (!) 171/107  Pulse: 74 72 77 76  Resp: 17 18 18 18   Temp: 97.7 F (36.5 C) 97.6 F (36.4 C) 97.9 F (36.6 C) 98.1 F (36.7 C)  TempSrc: Oral   Oral  SpO2: 97% 100% 97% 98%  Weight:      Height:        Intake/Output Summary (Last 24 hours) at 09/05/2020 1027 Last data filed  at 09/05/2020 1014 Gross per 24 hour  Intake 734.98 ml  Output 30 ml  Net 704.98 ml   Filed Weights   08/31/20 0640 08/31/20 2134  Weight: 45.4 kg 44.1 kg    Examination:  General exam: Appears calm and comfortable.  Appear malnourished. Respiratory system: Decreased breathing sounds.Marland Kitchen Respiratory effort normal. Cardiovascular system: S1 & S2 heard, RRR. No JVD, murmurs, rubs, gallops or clicks. No pedal edema. Gastrointestinal system: Abdomen is nondistended, soft  and nontender. No organomegaly or masses felt. Normal bowel sounds heard. Central nervous system: Alert and oriented. No focal neurological deficits. Extremities: Symmetric 5 x 5 power. Skin: No rashes, lesions or ulcers Psychiatry: Judgement and insight appear normal. Mood & affect appropriate.     Data Reviewed: I have personally reviewed following labs and imaging studies  CBC: Recent Labs  Lab 08/31/20 0643 09/01/20 0523 09/04/20 0513  WBC 10.7* 5.9 7.0  NEUTROABS  --   --  3.6  HGB 11.1* 10.4* 11.6*  HCT 32.9* 31.3* 34.1*  MCV 95.1 96.0 94.5  PLT 463* 372 419*   Basic Metabolic Panel: Recent Labs  Lab 08/31/20 0643 09/01/20 0523 09/04/20 0513 09/05/20 0513  NA 128* 133* 136  --   K 3.7 3.7 4.3  --   CL 97* 106 106  --   CO2 22 21* 23  --   GLUCOSE 119* 125* 107*  --   BUN 10 13 10   --   CREATININE 1.05* 1.09* 0.89 0.97  CALCIUM 9.0 8.7* 9.3  --    GFR: Estimated Creatinine Clearance: 44.5 mL/min (by C-G formula based on SCr of 0.97 mg/dL). Liver Function Tests: Recent Labs  Lab 09/04/20 0513  AST 42*  ALT 21  ALKPHOS 111  BILITOT 0.7  PROT 7.4  ALBUMIN 2.3*   No results for input(s): LIPASE, AMYLASE in the last 168 hours. No results for input(s): AMMONIA in the last 168 hours. Coagulation Profile: Recent Labs  Lab 08/31/20 0847  INR 1.2   Cardiac Enzymes: No results for input(s): CKTOTAL, CKMB, CKMBINDEX, TROPONINI in the last 168 hours. BNP (last 3 results) No results for input(s): PROBNP in the last 8760 hours. HbA1C: No results for input(s): HGBA1C in the last 72 hours. CBG: No results for input(s): GLUCAP in the last 168 hours. Lipid Profile: No results for input(s): CHOL, HDL, LDLCALC, TRIG, CHOLHDL, LDLDIRECT in the last 72 hours. Thyroid Function Tests: No results for input(s): TSH, T4TOTAL, FREET4, T3FREE, THYROIDAB in the last 72 hours. Anemia Panel: No results for input(s): VITAMINB12, FOLATE, FERRITIN, TIBC, IRON, RETICCTPCT in  the last 72 hours. Sepsis Labs: Recent Labs  Lab 08/31/20 0847  LATICACIDVEN 0.9    Recent Results (from the past 240 hour(s))  Resp Panel by RT-PCR (Flu A&B, Covid) Nasopharyngeal Swab     Status: None   Collection Time: 08/31/20  8:47 AM   Specimen: Nasopharyngeal Swab; Nasopharyngeal(NP) swabs in vial transport medium  Result Value Ref Range Status   SARS Coronavirus 2 by RT PCR NEGATIVE NEGATIVE Final    Comment: (NOTE) SARS-CoV-2 target nucleic acids are NOT DETECTED.  The SARS-CoV-2 RNA is generally detectable in upper respiratory specimens during the acute phase of infection. The lowest concentration of SARS-CoV-2 viral copies this assay can detect is 138 copies/mL. A negative result does not preclude SARS-Cov-2 infection and should not be used as the sole basis for treatment or other patient management decisions. A negative result may occur with  improper specimen collection/handling, submission of specimen  other than nasopharyngeal swab, presence of viral mutation(s) within the areas targeted by this assay, and inadequate number of viral copies(<138 copies/mL). A negative result must be combined with clinical observations, patient history, and epidemiological information. The expected result is Negative.  Fact Sheet for Patients:  BloggerCourse.com  Fact Sheet for Healthcare Providers:  SeriousBroker.it  This test is no t yet approved or cleared by the [place] FDA and  has been authorized for detection and/or diagnosis of SARS-CoV-2 by FDA under an Emergency Use Authorization (EUA). This EUA will remain  in effect (meaning this test can be used) for the duration of the COVID-19 declaration under Section 564(b)(1) of the Act, 21 U.S.C.section 360bbb-3(b)(1), unless the authorization is terminated  or revoked sooner.       Influenza A by PCR NEGATIVE NEGATIVE Final   Influenza B by PCR NEGATIVE NEGATIVE Final     Comment: (NOTE) The Xpert Xpress SARS-CoV-2/FLU/RSV plus assay is intended as an aid in the diagnosis of influenza from Nasopharyngeal swab specimens and should not be used as a sole basis for treatment. Nasal washings and aspirates are unacceptable for Xpert Xpress SARS-CoV-2/FLU/RSV testing.  Fact Sheet for Patients: BloggerCourse.com  Fact Sheet for Healthcare Providers: SeriousBroker.it  This test is not yet approved or cleared by the [place] FDA and has been authorized for detection and/or diagnosis of SARS-CoV-2 by FDA under an Emergency Use Authorization (EUA). This EUA will remain in effect (meaning this test can be used) for the duration of the COVID-19 declaration under Section 564(b)(1) of the Act, 21 U.S.C. section 360bbb-3(b)(1), unless the authorization is terminated or revoked.  Performed at [facility], [address] [place], [place] [address]   Blood Culture (routine x 2)     Status: None   Collection Time: 08/31/20  8:47 AM   Specimen: BLOOD  Result Value Ref Range Status   Specimen Description BLOOD LEFT ARM  Final   Special Requests   Final    BOTTLES DRAWN AEROBIC AND ANAEROBIC Blood Culture results may not be optimal due to an excessive volume of blood received in culture bottles   Culture   Final    NO GROWTH 5 DAYS Performed at [facility], [address] [place], [place] [address]    Report Status 09/05/2020 FINAL  Final  Blood Culture (routine x 2)     Status: None   Collection Time: 08/31/20  8:47 AM   Specimen: BLOOD  Result Value Ref Range Status   Specimen Description BLOOD RIGHT ARM  Final   Special Requests   Final    BOTTLES DRAWN AEROBIC AND ANAEROBIC Blood Culture results may not be optimal due to an excessive volume of blood received in culture bottles   Culture   Final    NO GROWTH 5 DAYS Performed at [facility], [address],  [place], [place] [address]    Report Status 09/05/2020 FINAL  Final  Urine culture     Status: Abnormal   Collection Time: 08/31/20  8:47 AM   Specimen: In/Out Cath Urine  Result Value Ref Range Status   Specimen Description   Final    IN/OUT CATH URINE Performed at [facility], [address] [place], [place] [address]    Special Requests   Final    NONE Performed at [facility], [address], [place], [place] [address]    Culture 60,000 COLONIES/mL ESCHERICHIA COLI (A)  Final   Report Status 09/02/2020  FINAL  Final   Organism ID, Bacteria ESCHERICHIA COLI (A)  Final      Susceptibility   Escherichia coli - MIC*    AMPICILLIN 8 SENSITIVE Sensitive     CEFAZOLIN <=4 SENSITIVE Sensitive     CEFEPIME <=0.12 SENSITIVE Sensitive     CEFTRIAXONE <=0.25 SENSITIVE Sensitive     CIPROFLOXACIN <=0.25 SENSITIVE Sensitive     GENTAMICIN <=1 SENSITIVE Sensitive     IMIPENEM <=0.25 SENSITIVE Sensitive     NITROFURANTOIN <=16 SENSITIVE Sensitive     TRIMETH/SULFA <=20 SENSITIVE Sensitive     AMPICILLIN/SULBACTAM <=2 SENSITIVE Sensitive     PIP/TAZO <=4 SENSITIVE Sensitive     * 60,000 COLONIES/mL ESCHERICHIA COLI  Aerobic/Anaerobic Culture w Gram Stain (surgical/deep wound)     Status: None (Preliminary result)   Collection Time: 08/31/20  2:51 PM   Specimen: PATH Cytology Pleural fluid  Result Value Ref Range Status   Specimen Description   Final    PLEURAL Performed at [facility], [address], [place], [place] [address]    Special Requests   Final    NONE Performed at [facility], [address] [place], [place] [address]    Gram Stain   Final    ABUNDANT WBC PRESENT, PREDOMINANTLY PMN FEW GRAM POSITIVE COCCI IN PAIRS IN CLUSTERS Performed at [facility], [address] [place], [place] [address]    Culture   Final    MODERATE METHICILLIN RESISTANT STAPHYLOCOCCUS AUREUS NO ANAEROBES ISOLATED; CULTURE IN PROGRESS FOR 5 DAYS     Report Status PENDING  Incomplete   Organism ID, Bacteria METHICILLIN RESISTANT STAPHYLOCOCCUS AUREUS  Final      Susceptibility   Methicillin resistant staphylococcus aureus - MIC*    CIPROFLOXACIN >=8 RESISTANT Resistant     ERYTHROMYCIN >=8 RESISTANT Resistant     GENTAMICIN <=0.5 SENSITIVE Sensitive     OXACILLIN >=4 RESISTANT Resistant     TETRACYCLINE <=1 SENSITIVE Sensitive     VANCOMYCIN 1 SENSITIVE Sensitive     TRIMETH/SULFA <=10 SENSITIVE Sensitive     CLINDAMYCIN >=8 RESISTANT Resistant     RIFAMPIN <=0.5 SENSITIVE Sensitive     Inducible Clindamycin NEGATIVE Sensitive     * MODERATE METHICILLIN RESISTANT STAPHYLOCOCCUS AUREUS         Radiology Studies: DG Chest 2 View  Result Date: 09/05/2020 CLINICAL DATA:  Right-sided chest tube. EXAM: CHEST - 2 VIEW COMPARISON:  09/02/2020; 09/01/2020; 08/31/2020; chest CT-09/03/2020; 08/31/2020 FINDINGS: Grossly unchanged cardiac silhouette and mediastinal contours with atherosclerotic plaque within the thoracic aorta. There is persistent thickening the right paratracheal stripe secondary to prominent vasculature. Stable positioning of support apparatus. No pneumothorax. Unchanged small right-sided effusion with associated right basilar opacities. There is unchanged mild diffuse thickening of the pulmonary interstitium within the right lung base. The left lung remains well aerated. No definite evidence of edema. No acute osseous abnormalities. IMPRESSION: 1. Stable positioning of support apparatus. No pneumothorax. 2. Unchanged small right-sided effusion and associated right basilar infiltrates. Electronically Signed   By: Simonne Come M.D.   On: 09/05/2020 08:07        Scheduled Meds: . carvedilol  12.5 mg Oral BID WC  . enoxaparin (LOVENOX) injection  30 mg Subcutaneous Q24H  . feeding supplement  237 mL Oral BID BM  . lisinopril  10 mg Oral Daily  . multivitamin with minerals  1 tablet Oral Daily  . nicotine  14 mg Transdermal  Daily  . sodium chloride  flush  3 mL Intravenous Q12H   Continuous Infusions: . sodium chloride Stopped (09/03/20 2208)  . sodium chloride Stopped (09/05/20 0438)  . ampicillin-sulbactam (UNASYN) IV 200 mL/hr at 09/05/20 0439  . vancomycin Stopped (09/04/20 1450)     LOS: 5 days    Time spent: 28 minutes    Marrion Coy, MD [facility]   To contact the attending provider between 7A-7P or the covering provider during after hours 7P-7A, please log into the web site [email or web address] and access using universal [place] password for that web site. If you do not have the password, please call the hospital operator.  09/05/2020, 10:27 AM

## 2020-09-05 NOTE — Progress Notes (Signed)
PHARMACIST - PHYSICIAN COMMUNICATION  CONCERNING:  Enoxaparin (Lovenox) for DVT Prophylaxis    RECOMMENDATION: Patient was prescribed enoxaparin 40mg  q24 hours for VTE prophylaxis.   Filed Weights   08/31/20 0640 08/31/20 2134  Weight: 45.4 kg (100 lb) 44.1 kg (97 lb 3.6 oz)    Body mass index is 17.22 kg/m.  Estimated Creatinine Clearance: 44.5 mL/min (by C-G formula based on SCr of 0.97 mg/dL).   Patient is candidate for enoxaparin 30mg  every 24 hours based on CrCl <50ml/min or Weight <45kg  DESCRIPTION: Pharmacy has adjusted enoxaparin dose per [facility] policy.  Patient is now receiving enoxaparin 30 mg every 24 hours    31m 09/05/2020 7:50 AM

## 2020-09-06 ENCOUNTER — Inpatient Hospital Stay (HOSPITAL_COMMUNITY)
Admission: AD | Admit: 2020-09-06 | Discharge: 2020-09-08 | DRG: 177 | Disposition: A | Discharge status: Home / Self Care | Payer: Self-pay | Source: Other Acute Inpatient Hospital | Attending: Internal Medicine | Admitting: Internal Medicine

## 2020-09-06 ENCOUNTER — Inpatient Hospital Stay: Payer: Self-pay

## 2020-09-06 DIAGNOSIS — Z832 Family history of diseases of the blood and blood-forming organs and certain disorders involving the immune mechanism: Secondary | ICD-10-CM

## 2020-09-06 DIAGNOSIS — J15212 Pneumonia due to Methicillin resistant Staphylococcus aureus: Secondary | ICD-10-CM | POA: Diagnosis present

## 2020-09-06 DIAGNOSIS — Z823 Family history of stroke: Secondary | ICD-10-CM

## 2020-09-06 DIAGNOSIS — F32A Depression, unspecified: Secondary | ICD-10-CM | POA: Diagnosis present

## 2020-09-06 DIAGNOSIS — N390 Urinary tract infection, site not specified: Secondary | ICD-10-CM | POA: Diagnosis present

## 2020-09-06 DIAGNOSIS — F419 Anxiety disorder, unspecified: Secondary | ICD-10-CM | POA: Diagnosis present

## 2020-09-06 DIAGNOSIS — J90 Pleural effusion, not elsewhere classified: Secondary | ICD-10-CM | POA: Diagnosis present

## 2020-09-06 DIAGNOSIS — J869 Pyothorax without fistula: Principal | ICD-10-CM

## 2020-09-06 DIAGNOSIS — Z8262 Family history of osteoporosis: Secondary | ICD-10-CM

## 2020-09-06 DIAGNOSIS — J440 Chronic obstructive pulmonary disease with acute lower respiratory infection: Secondary | ICD-10-CM | POA: Diagnosis present

## 2020-09-06 DIAGNOSIS — F17210 Nicotine dependence, cigarettes, uncomplicated: Secondary | ICD-10-CM | POA: Diagnosis present

## 2020-09-06 DIAGNOSIS — E43 Unspecified severe protein-calorie malnutrition: Secondary | ICD-10-CM | POA: Diagnosis present

## 2020-09-06 DIAGNOSIS — B1920 Unspecified viral hepatitis C without hepatic coma: Secondary | ICD-10-CM | POA: Diagnosis present

## 2020-09-06 DIAGNOSIS — I10 Essential (primary) hypertension: Secondary | ICD-10-CM | POA: Diagnosis present

## 2020-09-06 DIAGNOSIS — Z79899 Other long term (current) drug therapy: Secondary | ICD-10-CM

## 2020-09-06 DIAGNOSIS — B9620 Unspecified Escherichia coli [E. coli] as the cause of diseases classified elsewhere: Secondary | ICD-10-CM | POA: Diagnosis present

## 2020-09-06 DIAGNOSIS — Z9689 Presence of other specified functional implants: Secondary | ICD-10-CM

## 2020-09-06 DIAGNOSIS — Z681 Body mass index (BMI) 19 or less, adult: Secondary | ICD-10-CM

## 2020-09-06 LAB — BASIC METABOLIC PANEL
Anion gap: 8 (ref 5–15)
BUN: 18 mg/dL (ref 6–20)
CO2: 25 mmol/L (ref 22–32)
Calcium: 9.5 mg/dL (ref 8.9–10.3)
Chloride: 103 mmol/L (ref 98–111)
Creatinine, Ser: 1.11 mg/dL — ABNORMAL HIGH (ref 0.44–1.00)
GFR, Estimated: 58 mL/min — ABNORMAL LOW (ref >60)
Glucose, Bld: 111 mg/dL — ABNORMAL HIGH (ref 70–99)
Potassium: 4.4 mmol/L (ref 3.5–5.1)
Sodium: 136 mmol/L (ref 135–145)

## 2020-09-06 LAB — CBC WITH DIFFERENTIAL/PLATELET
Abs Immature Granulocytes: 0.06 10*3/uL (ref 0.00–0.07)
Basophils Absolute: 0.1 10*3/uL (ref 0.0–0.1)
Basophils Relative: 1 %
Eosinophils Absolute: 0.1 10*3/uL (ref 0.0–0.5)
Eosinophils Relative: 1 %
HCT: 37.2 % (ref 36.0–46.0)
Hemoglobin: 12.6 g/dL (ref 12.0–15.0)
Immature Granulocytes: 1 %
Lymphocytes Relative: 38 %
Lymphs Abs: 3.7 10*3/uL (ref 0.7–4.0)
MCH: 32.3 pg (ref 26.0–34.0)
MCHC: 33.9 g/dL (ref 30.0–36.0)
MCV: 95.4 fL (ref 80.0–100.0)
Monocytes Absolute: 0.9 10*3/uL (ref 0.1–1.0)
Monocytes Relative: 9 %
Neutro Abs: 4.9 10*3/uL (ref 1.7–7.7)
Neutrophils Relative %: 50 %
Platelets: 436 10*3/uL — ABNORMAL HIGH (ref 150–400)
RBC: 3.9 MIL/uL (ref 3.87–5.11)
RDW: 16.3 % — ABNORMAL HIGH (ref 11.5–15.5)
WBC: 9.7 10*3/uL (ref 4.0–10.5)
nRBC: 0 % (ref 0.0–0.2)

## 2020-09-06 LAB — HEPATITIS C ANTIBODY: HCV Ab: REACTIVE — AB

## 2020-09-06 LAB — MRSA PCR SCREENING: MRSA by PCR: POSITIVE — AB

## 2020-09-06 LAB — PHOSPHORUS: Phosphorus: 3.7 mg/dL (ref 2.5–4.6)

## 2020-09-06 LAB — MAGNESIUM: Magnesium: 2.1 mg/dL (ref 1.7–2.4)

## 2020-09-06 MED ORDER — LORAZEPAM 1 MG PO TABS: 1.0000 mg | ORAL_TABLET | Freq: Four times a day (QID) | ORAL | Status: DC | PRN

## 2020-09-06 MED ORDER — NICOTINE 14 MG/24HR TD PT24: 14.0000 mg | MEDICATED_PATCH | Freq: Every day | TRANSDERMAL | 0 refills | Status: DC

## 2020-09-06 MED ORDER — ONDANSETRON HCL 4 MG/2ML IJ SOLN: 4.0000 mg | Freq: Four times a day (QID) | INTRAMUSCULAR | Status: DC | PRN

## 2020-09-06 MED ORDER — AMLODIPINE BESYLATE 10 MG PO TABS: 10.0000 mg | ORAL_TABLET | Freq: Every day | ORAL | Status: DC

## 2020-09-06 MED ORDER — OXYCODONE HCL 5 MG PO TABS
5.0000 mg | ORAL_TABLET | Freq: Four times a day (QID) | ORAL | Status: DC | PRN
Start: 2020-09-06 — End: 2020-09-08
  Administered 2020-09-06 – 2020-09-08 (×6): 5 mg via ORAL
  Filled 2020-09-06 (×6): qty 1

## 2020-09-06 MED ORDER — ACETAMINOPHEN 325 MG PO TABS: 650.0000 mg | ORAL_TABLET | Freq: Four times a day (QID) | ORAL | Status: DC | PRN

## 2020-09-06 MED ORDER — LINEZOLID 600 MG PO TABS
600.0000 mg | ORAL_TABLET | Freq: Two times a day (BID) | ORAL | Status: DC
  Administered 2020-09-06: 600 mg via ORAL
  Filled 2020-09-06 (×2): qty 1

## 2020-09-06 MED ORDER — MELATONIN 3 MG PO TABS
3.0000 mg | ORAL_TABLET | Freq: Every day | ORAL | Status: DC
  Administered 2020-09-06 – 2020-09-07 (×2): 3 mg via ORAL
  Filled 2020-09-06 (×2): qty 1

## 2020-09-06 MED ORDER — ENOXAPARIN SODIUM 30 MG/0.3ML ~~LOC~~ SOLN
30.0000 mg | SUBCUTANEOUS | Status: DC
  Filled 2020-09-06: qty 0.3

## 2020-09-06 MED ORDER — NICOTINE 14 MG/24HR TD PT24
14.0000 mg | MEDICATED_PATCH | Freq: Every day | TRANSDERMAL | Status: DC
  Administered 2020-09-06 – 2020-09-08 (×3): 14 mg via TRANSDERMAL
  Filled 2020-09-06 (×3): qty 1

## 2020-09-06 MED ORDER — LINEZOLID 600 MG PO TABS
600.0000 mg | ORAL_TABLET | Freq: Two times a day (BID) | ORAL | Status: DC
  Administered 2020-09-06 – 2020-09-08 (×4): 600 mg via ORAL
  Filled 2020-09-06 (×5): qty 1

## 2020-09-06 MED ORDER — HYDROCODONE-ACETAMINOPHEN 5-325 MG PO TABS: 1.0000 | ORAL_TABLET | ORAL | 0 refills | Status: DC | PRN

## 2020-09-06 MED ORDER — ENSURE ENLIVE PO LIQD
237.0000 mL | Freq: Two times a day (BID) | ORAL | Status: DC
  Administered 2020-09-07 – 2020-09-08 (×3): 237 mL via ORAL

## 2020-09-06 MED ORDER — LORAZEPAM 1 MG PO TABS: 1.0000 mg | ORAL_TABLET | Freq: Four times a day (QID) | ORAL | 0 refills | Status: DC | PRN

## 2020-09-06 MED ORDER — MORPHINE SULFATE (PF) 4 MG/ML IV SOLN
4.0000 mg | INTRAVENOUS | Status: DC | PRN
  Administered 2020-09-06 – 2020-09-07 (×5): 4 mg via INTRAVENOUS
  Filled 2020-09-06 (×5): qty 1

## 2020-09-06 MED ORDER — LINEZOLID 600 MG PO TABS: 600.0000 mg | ORAL_TABLET | Freq: Two times a day (BID) | ORAL | Status: DC

## 2020-09-06 MED ORDER — ENOXAPARIN SODIUM 30 MG/0.3ML ~~LOC~~ SOLN: 30.0000 mg | SUBCUTANEOUS | Status: DC

## 2020-09-06 MED ORDER — AMLODIPINE BESYLATE 10 MG PO TABS
10.0000 mg | ORAL_TABLET | Freq: Every day | ORAL | Status: DC
  Administered 2020-09-06 – 2020-09-08 (×3): 10 mg via ORAL
  Filled 2020-09-06 (×3): qty 1

## 2020-09-06 MED ORDER — TRAZODONE HCL 50 MG PO TABS: 50.0000 mg | ORAL_TABLET | Freq: Every evening | ORAL | Status: DC | PRN

## 2020-09-06 MED ORDER — ENSURE ENLIVE PO LIQD: 237.0000 mL | Freq: Two times a day (BID) | ORAL | 0 refills | Status: DC

## 2020-09-06 NOTE — Discharge Summary (Signed)
Physician Discharge Summary  Patient ID: Jontavia Leatherbury MRN: [id number] DOB/AGE: February 23, 1961 60 y.o.  Admit date: 08/31/2020 Discharge date: 09/06/2020  Admission Diagnoses:  Discharge Diagnoses:  Principal Problem:   Pleural effusion on right Active Problems:   Hypertension   Collagen vascular disease (HCC)   Hyponatremia   Acute lower UTI   Nicotine dependence   Protein-calorie malnutrition, severe   Empyema lung (HCC) Abdominal aortic aneurysm. Lung nodule.  Discharged Condition: good  Hospital Course:  The patient is a 60 yr old woman who presented to [facility] ED on 08/31/2020 with complaints of increasing shortness of breath, pleuritic chest pain, and cough productive of purulent sputum. CXR in the ED demonstrated a large loculated right hydropneumothorax. With a mild left basilar pulmonary infiltrate due to infection vs aspiration. CT of the chest with contrast also demonstrated this hydropneumothorax on the right with irregular thick walls that was suspicious for empyema. There was also associated compressive right lower lobe atelectasis. There were scattered subpleural reticulation and ground glass opacities within the aerated portion of the lungs. There was a 6 mm left lower lobe pulmonary nodule that was minimally enlarged since 2010 likely benign. There was also emphysema and AAA 4.2 cm.  Chest tube was placed by interventional radiology and surgery was consulted.  Repeat chest CT scan on 3/5 showed improvement of emphysema.  The patient has been treated with vancomycin and Unasyn.  #1.  Right-sided empyema with hydropneumothorax due to MRSA Right lower lobe community-acquired pneumonia due to MRSA. Status post chest tube placement. Culture came back with MRSA. Patient was treated with vancomycin, switched to Zyvox. Patient condition has been better, chest tube is scheduled to come out today.  However, after discussion with infectious disease, patient will need decortication  before the condition finally resolves. Currently [facility] does not have the capacity to perform this procedure, will transfer patient to [facility]. Currently patient is medically stable for transfer.  #2.  E. coli UTI. Antibiotics completed.  #3.  Hyponatremia Condition improved  4.  Severe protein calorie malnutrition. Continue supplement.  5. Reactive thrombocytosis. Improved.  Consults: ID and general surgery  IR  Significant Diagnostic Studies:  CT CHEST WITH CONTRAST  TECHNIQUE: Multidetector CT imaging of the chest was performed during intravenous contrast administration.  CONTRAST:  77mL OMNIPAQUE IOHEXOL 300 MG/ML  SOLN  COMPARISON:  Chest x-ray September 02, 2020.  Chest CT August 31, 2020.  FINDINGS: Cardiovascular: Atherosclerotic change seen in the thoracic aorta without dissection. The ascending thoracic aorta is mildly dilated measuring up to 4.2 cm near its root. Calcified atherosclerosis in the LAD. The heart is otherwise normal in appearance. Central pulmonary arteries are poorly opacified normal in caliber.  Mediastinum/Nodes: No left-sided pleural effusion. The thick walled fluid collection containing air on the comparison CT scan is partially collapsed, now containing a chest tube located within the fluid. There has been significant improvement in the size of the suspected empyema. No pericardial effusion. The chest wall is normal. The thyroid and esophagus are normal. Shotty nodes in the precarinal region are likely reactive. Shotty nodes in the right inferior hilum are likely reactive.  Lungs/Pleura: Central airways are normal. Other than the air in the right remaining pleural collection, no air seen in the pleural spaces. Emphysematous changes are noted. Subpleural reticulations are stable. The left lower lobe 6 mm pulmonary nodule is without significant change. No new nodules or masses. No new infiltrates. Mild tree-in-bud  opacity in the right lateral lung  base remains. Mild opacity in the left lateral lung base remains.  Upper Abdomen: No interval changes or acute abnormalities.  Musculoskeletal: No chest wall abnormality. No acute or significant osseous findings.  IMPRESSION: 1. The suspected right empyema persists but has significantly improved, much smaller in size, in the interval. 2. Probable reactive nodes in the precarinal region and right infrahilar region. 3. Mild opacities in the bases worrisome for a mild stable infectious process. 4. Mild emphysema. 5. 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 6. Stable 6 mm nodule in the left base, unchanged since 2010 based on the previous CT report, consistent with a benign nodule. 7. Mild atherosclerotic changes in the thoracic aorta. Coronary artery disease.  Aortic Atherosclerosis (ICD10-I70.0) and Emphysema (ICD10-J43.9).   Electronically Signed   By: Gerome Sam III M.D   On: 09/03/2020 09:23     Treatments: antibiotics with vancomycin, Chest tube  Discharge Exam: Blood pressure (!) 152/99, pulse 92, temperature 98.3 F (36.8 C), temperature source Oral, resp. rate 18, height 5\' 3"  (1.6 m), weight 44.1 kg, SpO2 100 %. General appearance: alert and cooperative Resp: Decreased breathing sounds. Cardio: regular rate and rhythm, S1, S2 normal, no murmur, click, rub or gallop GI: soft, non-tender; bowel sounds normal; no masses,  no organomegaly Extremities: extremities normal, atraumatic, no cyanosis or edema  Disposition: Discharge disposition: 82-DC/txfr to short term hosp for IP care with planned acute care IP readmission       Discharge Instructions    Diet - low sodium heart healthy   Complete by: As directed    Discharge wound care:   Complete by: As directed    Keep clean   Increase activity slowly   Complete by: As directed      Allergies as of 09/06/2020   No Known  Allergies     Medication List    STOP taking these medications   amitriptyline 25 MG tablet Commonly known as: ELAVIL     TAKE these medications   acetaminophen 325 MG tablet Commonly known as: TYLENOL Take 2 tablets (650 mg total) by mouth every 6 (six) hours as needed for mild pain (or Fever >/= 101).   amLODipine 10 MG tablet Commonly known as: NORVASC Take 1 tablet (10 mg total) by mouth daily.   aspirin 81 MG EC tablet Take 1 tablet (81 mg total) by mouth daily.   carvedilol 12.5 MG tablet Commonly known as: COREG Take 1 tablet (12.5 mg total) by mouth 2 (two) times daily with a meal.   enoxaparin 30 MG/0.3ML injection Commonly known as: LOVENOX Inject 0.3 mLs (30 mg total) into the skin daily.   feeding supplement Liqd Take 237 mLs by mouth 2 (two) times daily between meals.   HYDROcodone-acetaminophen 5-325 MG tablet Commonly known as: NORCO/VICODIN Take 1 tablet by mouth every 4 (four) hours as needed for moderate pain or severe pain.   linezolid 600 MG tablet Commonly known as: ZYVOX Take 1 tablet (600 mg total) by mouth every 12 (twelve) hours.   lisinopril 10 MG tablet Commonly known as: ZESTRIL Take 1 tablet (10 mg total) by mouth daily.   LORazepam 1 MG tablet Commonly known as: ATIVAN Take 1 tablet (1 mg total) by mouth every 6 (six) hours as needed for anxiety.   multivitamin with minerals Tabs tablet Take 1 tablet by mouth daily.   nicotine 14 mg/24hr patch Commonly known as: NICODERM CQ - dosed in mg/24 hours Place 1 patch (14  mg total) onto the skin daily.   traZODone 50 MG tablet Commonly known as: DESYREL Take 1 tablet (50 mg total) by mouth at bedtime as needed for sleep.            Discharge Care Instructions  (From admission, onward)         Start     Ordered   09/06/20 0000  Discharge wound care:       Comments: Keep clean   09/06/20 0928          35 minutes Signed: Marrion Coy 09/06/2020, 9:28 AM

## 2020-09-06 NOTE — Progress Notes (Signed)
Plan is for patient to be discharged to [facility] for surgery.  Waiting for a bed.  Chest tube still in place with minimum output.

## 2020-09-06 NOTE — H&P (Signed)
History and Physical    Nicole [name] ZOX:[id number] DOB: 1961/01/14 DOA: 09/06/2020  PCP: [facility] (Confirm with patient/family/NH records and if not entered, this has to be entered at [facility] point of entry) Patient coming from: Home  I have personally briefly reviewed patient's old medical records in [facility]  Chief Complaint: Feeling fine  HPI: Nicole [name] is a 60 y.o. female with medical history significant of HTN, cigarette smoke, anxiety depression, asthma, hepatitis C, presented to emergency with complaints of increasing shortness of breath on 08/31/2020.  Subsequently, work-up found patient has large loculated right-sided hydropneumothorax, and further CT scan suspect empyema.  On symptom admission, chest tube was placed in and repeat CAT scan showed improved empyema.  Patient was initially treated with vancomycin and Unasyn, which switched to Zyvox single therapy yesterday by infectious disease, who also recommend transfer to [facility] for decortication.  Patient has remained stable, compliant chest tube insertion site pain, no cough no fever chills.  Review of Systems: As per HPI otherwise 14 point review of systems negative.    Past Medical History:  Diagnosis Date  . Anxiety   . Asthma   . Collagen vascular disease (HCC)   . COPD (chronic obstructive pulmonary disease) (HCC)   . Hepatitis C   . Hypertension     Past Surgical History:  Procedure Laterality Date  . ABDOMINAL HYSTERECTOMY    . FOOT SURGERY       reports that she has been smoking cigarettes. She has been smoking about 1.00 pack per day. She has never used smokeless tobacco. She reports current alcohol use. She reports that she does not use drugs.  No Known Allergies  Family History  Problem Relation Age of Onset  . Rheum arthritis Other   . Osteoporosis Other   . Clotting disorder Other   . Stroke Other      Prior to Admission medications   Medication  Sig Start Date End Date Taking? Authorizing Provider  acetaminophen (TYLENOL) 325 MG tablet Take 2 tablets (650 mg total) by mouth every 6 (six) hours as needed for mild pain (or Fever >/= 101). 09/06/20   Marrion Coy, MD  amLODipine (NORVASC) 10 MG tablet Take 1 tablet (10 mg total) by mouth daily. 09/06/20   Marrion Coy, MD  aspirin 81 MG EC tablet Take 1 tablet (81 mg total) by mouth daily. Patient not taking: Reported on 08/31/2020 12/13/18   Enedina Finner, MD  carvedilol (COREG) 12.5 MG tablet Take 1 tablet (12.5 mg total) by mouth 2 (two) times daily with a meal. Patient not taking: Reported on 08/31/2020 12/12/18   Enedina Finner, MD  feeding supplement (ENSURE ENLIVE / ENSURE PLUS) LIQD Take 237 mLs by mouth 2 (two) times daily between meals. 09/06/20   Marrion Coy, MD  HYDROcodone-acetaminophen (NORCO/VICODIN) 5-325 MG tablet Take 1 tablet by mouth every 4 (four) hours as needed for moderate pain or severe pain. 09/06/20   Marrion Coy, MD  linezolid (ZYVOX) 600 MG tablet Take 1 tablet (600 mg total) by mouth every 12 (twelve) hours. 09/06/20   Marrion Coy, MD  lisinopril (ZESTRIL) 10 MG tablet Take 1 tablet (10 mg total) by mouth daily. Patient not taking: Reported on 08/31/2020 12/13/18   Enedina Finner, MD  LORazepam (ATIVAN) 1 MG tablet Take 1 tablet (1 mg total) by mouth every 6 (six) hours as needed for anxiety. 09/06/20   Marrion Coy, MD  Multiple Vitamin (MULTIVITAMIN WITH MINERALS) TABS tablet  Take 1 tablet by mouth daily. Patient not taking: Reported on 08/31/2020 12/13/18   Enedina FinnerPatel, Sona, MD  nicotine (NICODERM CQ - DOSED IN MG/24 HOURS) 14 mg/24hr patch Place 1 patch (14 mg total) onto the skin daily. 09/06/20   Marrion CoyZhang, Dekui, MD  traZODone (DESYREL) 50 MG tablet Take 1 tablet (50 mg total) by mouth at bedtime as needed for sleep. 09/06/20   Marrion CoyZhang, Dekui, MD    Physical Exam: Vitals:   09/06/20 1528  BP: (!) 138/93  Pulse: 72  Resp: 16  Temp: 97.8 F (36.6 C)  TempSrc: Oral  SpO2: 98%     Constitutional: NAD, calm, comfortable Vitals:   09/06/20 1528  BP: (!) 138/93  Pulse: 72  Resp: 16  Temp: 97.8 F (36.6 C)  TempSrc: Oral  SpO2: 98%   Eyes: PERRL, lids and conjunctivae normal ENMT: Mucous membranes are moist. Posterior pharynx clear of any exudate or lesions.Normal dentition.  Neck: normal, supple, no masses, no thyromegaly Respiratory: clear to auscultation bilaterally, no wheezing, no crackles. Normal respiratory effort. No accessory muscle use. Chest tube in place Cardiovascular: Regular rate and rhythm, no murmurs / rubs / gallops. No extremity edema. 2+ pedal pulses. No carotid bruits.  Abdomen: no tenderness, no masses palpated. No hepatosplenomegaly. Bowel sounds positive.  Musculoskeletal: no clubbing / cyanosis. No joint deformity upper and lower extremities. Good ROM, no contractures. Normal muscle tone.  Skin: no rashes, lesions, ulcers. No induration Neurologic: CN 2-12 grossly intact. Sensation intact, DTR normal. Strength 5/5 in all 4.  Psychiatric: Normal judgment and insight. Alert and oriented x 3. Normal mood.    Labs on Admission: I have personally reviewed following labs and imaging studies  CBC: Recent Labs  Lab 08/31/20 0643 09/01/20 0523 09/04/20 0513 09/06/20 0742  WBC 10.7* 5.9 7.0 9.7  NEUTROABS  --   --  3.6 4.9  HGB 11.1* 10.4* 11.6* 12.6  HCT 32.9* 31.3* 34.1* 37.2  MCV 95.1 96.0 94.5 95.4  PLT 463* 372 419* 436*   Basic Metabolic Panel: Recent Labs  Lab 08/31/20 0643 09/01/20 0523 09/04/20 0513 09/05/20 0513 09/06/20 0742  NA 128* 133* 136  --  136  K 3.7 3.7 4.3  --  4.4  CL 97* 106 106  --  103  CO2 22 21* 23  --  25  GLUCOSE 119* 125* 107*  --  111*  BUN 10 13 10   --  18  CREATININE 1.05* 1.09* 0.89 0.97 1.11*  CALCIUM 9.0 8.7* 9.3  --  9.5  MG  --   --   --   --  2.1  PHOS  --   --   --   --  3.7   GFR: Estimated Creatinine Clearance: 38.9 mL/min (A) (by C-G formula based on SCr of 1.11 mg/dL  (H)). Liver Function Tests: Recent Labs  Lab 09/04/20 0513  AST 42*  ALT 21  ALKPHOS 111  BILITOT 0.7  PROT 7.4  ALBUMIN 2.3*   No results for input(s): LIPASE, AMYLASE in the last 168 hours. No results for input(s): AMMONIA in the last 168 hours. Coagulation Profile: Recent Labs  Lab 08/31/20 0847  INR 1.2   Cardiac Enzymes: No results for input(s): CKTOTAL, CKMB, CKMBINDEX, TROPONINI in the last 168 hours. BNP (last 3 results) No results for input(s): PROBNP in the last 8760 hours. HbA1C: No results for input(s): HGBA1C in the last 72 hours. CBG: No results for input(s): GLUCAP in the last 168 hours. Lipid Profile:  No results for input(s): CHOL, HDL, LDLCALC, TRIG, CHOLHDL, LDLDIRECT in the last 72 hours. Thyroid Function Tests: No results for input(s): TSH, T4TOTAL, FREET4, T3FREE, THYROIDAB in the last 72 hours. Anemia Panel: No results for input(s): VITAMINB12, FOLATE, FERRITIN, TIBC, IRON, RETICCTPCT in the last 72 hours. Urine analysis:    Component Value Date/Time   COLORURINE YELLOW (A) 08/31/2020 0847   APPEARANCEUR HAZY (A) 08/31/2020 0847   LABSPEC 1.021 08/31/2020 0847   PHURINE 6.0 08/31/2020 0847   GLUCOSEU NEGATIVE 08/31/2020 0847   HGBUR NEGATIVE 08/31/2020 0847   BILIRUBINUR NEGATIVE 08/31/2020 0847   KETONESUR NEGATIVE 08/31/2020 0847   PROTEINUR NEGATIVE 08/31/2020 0847   NITRITE POSITIVE (A) 08/31/2020 0847   LEUKOCYTESUR MODERATE (A) 08/31/2020 0847    Radiological Exams on Admission: DG Chest 2 View  Result Date: 09/06/2020 CLINICAL DATA:  61 year old female status post right pleural pigtail drainage catheter placed 6 days ago for empyema. EXAM: CHEST - 2 VIEW COMPARISON:  Chest radiographs 09/05/2020 and earlier. FINDINGS: Stable right pleural catheter. No pneumothorax. Posterior and basilar loculated residual pleural fluid or thickening is stable to mildly regressed since yesterday, with resolved fluid in the right minor fissure. Patchy  peripheral right lung opacity persists. Normal cardiac size and mediastinal contours. Visualized tracheal air column is within normal limits. Left lung appears stable and negative. Abdominal Calcified aortic atherosclerosis. Stable visualized osseous structures. Paucity of bowel gas IMPRESSION: 1. Stable right pleural catheter with stable to mildly regressed residual right pleural thickening/empyema since yesterday. Patchy peripheral right lung opacity persists. 2. No new cardiopulmonary abnormality. Electronically Signed   By: Odessa Fleming M.D.   On: 09/06/2020 06:59   DG Chest 2 View  Result Date: 09/05/2020 CLINICAL DATA:  Right-sided chest tube. EXAM: CHEST - 2 VIEW COMPARISON:  09/02/2020; 09/01/2020; 08/31/2020; chest CT-09/03/2020; 08/31/2020 FINDINGS: Grossly unchanged cardiac silhouette and mediastinal contours with atherosclerotic plaque within the thoracic aorta. There is persistent thickening the right paratracheal stripe secondary to prominent vasculature. Stable positioning of support apparatus. No pneumothorax. Unchanged small right-sided effusion with associated right basilar opacities. There is unchanged mild diffuse thickening of the pulmonary interstitium within the right lung base. The left lung remains well aerated. No definite evidence of edema. No acute osseous abnormalities. IMPRESSION: 1. Stable positioning of support apparatus. No pneumothorax. 2. Unchanged small right-sided effusion and associated right basilar infiltrates. Electronically Signed   By: Simonne Come M.D.   On: 09/05/2020 08:07    EKG: Independently reviewed. Sinus  Assessment/Plan Active Problems:   Empyema (HCC)  (please populate well all problems here in Problem List. (For example, if patient is on BP meds at home and you resume or decide to hold them, it is a problem that needs to be her. 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 Laneta Simmers, who plans to see the patient tomorrow, n.p.o. after midnight, in case patient go for decortication tomorrow -Will continue Zyvox as per ID, treatment length expected to be 4-6 wks -Ordered TTE as per ID's recommendation from [facility] -Avoid SSRI during Zyvox usage.  HTN -Stable, continue home regimen  Cigarette smoke -Nicotine patch  Anxiety/depression -Continue benzos, discontinue SSRI.  Uncomplicated E. coli UTI -Antibiotics treatment completed at [facility].  Severe protein calorie malnutrition -On supplement  DVT prophylaxis: Lovenox Code Status: Full Code Family Communication: None at bedside Disposition Plan: Likely will need 2-3 days hospital stay for  decortication and chest tube management. Consults called: CT surgery Admission status: Tele admit   Emeline General MD [facility] Pager [phone]  09/06/2020, 5:59 PM

## 2020-09-06 NOTE — Progress Notes (Signed)
Bed available for patient to transfer to [facility].  I called report to Florentina Addison, RN at [facility] and she voiced understanding and acceptance of the patient.  Report was also given to [facility].  Patient received a Norco and Zofran before she was transferred.  Patient left via stretcher escorted by [facility]

## 2020-09-06 NOTE — Plan of Care (Signed)

## 2020-09-07 ENCOUNTER — Other Ambulatory Visit (HOSPITAL_COMMUNITY): Payer: Medicaid Other

## 2020-09-07 ENCOUNTER — Inpatient Hospital Stay (HOSPITAL_COMMUNITY): Payer: Self-pay

## 2020-09-07 DIAGNOSIS — B9562 Methicillin resistant Staphylococcus aureus infection as the cause of diseases classified elsewhere: Secondary | ICD-10-CM

## 2020-09-07 DIAGNOSIS — J869 Pyothorax without fistula: Secondary | ICD-10-CM

## 2020-09-07 DIAGNOSIS — J189 Pneumonia, unspecified organism: Secondary | ICD-10-CM

## 2020-09-07 DIAGNOSIS — R7881 Bacteremia: Secondary | ICD-10-CM

## 2020-09-07 DIAGNOSIS — R8271 Bacteriuria: Secondary | ICD-10-CM

## 2020-09-07 DIAGNOSIS — B9620 Unspecified Escherichia coli [E. coli] as the cause of diseases classified elsewhere: Secondary | ICD-10-CM

## 2020-09-07 DIAGNOSIS — J449 Chronic obstructive pulmonary disease, unspecified: Secondary | ICD-10-CM

## 2020-09-07 DIAGNOSIS — Z720 Tobacco use: Secondary | ICD-10-CM

## 2020-09-07 DIAGNOSIS — J15212 Pneumonia due to Methicillin resistant Staphylococcus aureus: Secondary | ICD-10-CM

## 2020-09-07 LAB — ECHOCARDIOGRAM COMPLETE
AR max vel: 2.72 cm2
AV Area VTI: 2.23 cm2
AV Area mean vel: 2.85 cm2
AV Mean grad: 2 mmHg
AV Peak grad: 3.6 mmHg
Ao pk vel: 0.95 m/s
Area-P 1/2: 2.16 cm2
P 1/2 time: 802 msec
S' Lateral: 2.6 cm

## 2020-09-07 LAB — CBC
HCT: 34.6 % — ABNORMAL LOW (ref 36.0–46.0)
Hemoglobin: 11.4 g/dL — ABNORMAL LOW (ref 12.0–15.0)
MCH: 32.3 pg (ref 26.0–34.0)
MCHC: 32.9 g/dL (ref 30.0–36.0)
MCV: 98 fL (ref 80.0–100.0)
Platelets: 395 10*3/uL (ref 150–400)
RBC: 3.53 MIL/uL — ABNORMAL LOW (ref 3.87–5.11)
RDW: 16.5 % — ABNORMAL HIGH (ref 11.5–15.5)
WBC: 10 10*3/uL (ref 4.0–10.5)
nRBC: 0 % (ref 0.0–0.2)

## 2020-09-07 LAB — BASIC METABOLIC PANEL
Anion gap: 11 (ref 5–15)
BUN: 20 mg/dL (ref 6–20)
CO2: 22 mmol/L (ref 22–32)
Calcium: 9.6 mg/dL (ref 8.9–10.3)
Chloride: 98 mmol/L (ref 98–111)
Creatinine, Ser: 1.24 mg/dL — ABNORMAL HIGH (ref 0.44–1.00)
GFR, Estimated: 51 mL/min — ABNORMAL LOW (ref >60)
Glucose, Bld: 98 mg/dL (ref 70–99)
Potassium: 4.1 mmol/L (ref 3.5–5.1)
Sodium: 131 mmol/L — ABNORMAL LOW (ref 135–145)

## 2020-09-07 LAB — HCV RNA (INTERNATIONAL UNITS)
HCV RNA (International Units): 15900000 IU/mL
HCV log10: 7.201 log10 IU/mL

## 2020-09-07 LAB — HCV RNA QUANT

## 2020-09-07 MED ORDER — MUPIROCIN 2 % EX OINT
1.0000 "application " | TOPICAL_OINTMENT | Freq: Two times a day (BID) | CUTANEOUS | Status: DC
  Administered 2020-09-07 – 2020-09-08 (×3): 1 via NASAL
  Filled 2020-09-07 (×2): qty 22

## 2020-09-07 MED ORDER — LORATADINE 10 MG PO TABS
10.0000 mg | ORAL_TABLET | Freq: Every day | ORAL | Status: DC | PRN
  Administered 2020-09-07: 10 mg via ORAL
  Filled 2020-09-07: qty 1

## 2020-09-07 MED ORDER — CHLORHEXIDINE GLUCONATE CLOTH 2 % EX PADS
6.0000 | MEDICATED_PAD | Freq: Every day | CUTANEOUS | Status: DC
  Administered 2020-09-07 – 2020-09-08 (×2): 6 via TOPICAL

## 2020-09-07 NOTE — Progress Notes (Signed)
[facility]  Date of Admission:  09/06/2020      Lines: 3/02-c Chest tube  Abx: 3/08-c linezolid  3/3-3/7 amp-sulb 3/2-3/3 azith  ASSESSMENT: Recurrent pna mrsa empyema, right side, s/p pigtail catheter assymptomatic bacteriuria ecoli, non-esbl  60 yr old woman smoker, copd, bipolar, admitted 3/02 to [facility] ED (transferred to [facility] on 3/8 for cts evaluation) with complaints of 4-6 weeks increasing shortness of breath, pleuritic chest pain, and cough productive of purulent sputum, found to have pna/right sided empyema with mrsa  3/02 and 3/07 Blood cx negative cts eval'ed: no decortication needed at this time. Serial imaging showed much improved empyema with chest tube placed 3/02. Doesn't appear fibrinolytic/dnase were ever used 3/02 pleural fluid cx mrsa Interestingly, clinically already improved before MRSA coverage added, albeit with chest tube placed  PLAN: 1. tte 2. Deferred to PCCM regardign chest tube management 3. Continue linezolid; plan 4 weeks from 3/08 until 4/05   Active Problems:   Empyema (HCC)   Scheduled Meds: . amLODipine  10 mg Oral Daily  . enoxaparin (LOVENOX) injection  30 mg Subcutaneous Q24H  . feeding supplement  237 mL Oral BID BM  . linezolid  600 mg Oral Q12H  . melatonin  3 mg Oral QHS  . nicotine  14 mg Transdermal Daily   Continuous Infusions: PRN Meds:.acetaminophen, LORazepam, morphine injection, ondansetron (ZOFRAN) IV, oxyCODONE   SUBJECTIVE: Continues to feel better since chest tube placement No n/v/diarrhea/rash No focal pain  cts evaluated, no need for surgery   Review of Systems: ROS Other ros negative  No Known Allergies  OBJECTIVE: Vitals:   09/06/20 1528 09/06/20 1952 09/07/20 0512 09/07/20 0746  BP: (!) 138/93 (!) 147/96 139/88 (!) 141/106  Pulse: 72 86 77   Resp: 16 20 20 20   Temp: 97.8 F (36.6 C) 97.8 F (36.6 C) 97.7 F (36.5 C) 97.8 F (36.6 C)  TempSrc: Oral  Axillary Oral Oral  SpO2: 98% 98% 99% 99%   There is no height or weight on file to calculate BMI.  Physical Exam No distress, conversant, thin Heent: normocephalic; per; conj clear Neck supple cv rrr no mrg Lungs clear; normal respiratory effort; right sided chest tube intact abd s/nt Ext no edema Skin no rash Neuro nonfocal  Lab Results Lab Results  Component Value Date   WBC 10.0 09/07/2020   HGB 11.4 (L) 09/07/2020   HCT 34.6 (L) 09/07/2020   MCV 98.0 09/07/2020   PLT 395 09/07/2020    Lab Results  Component Value Date   CREATININE 1.24 (H) 09/07/2020   BUN 20 09/07/2020   NA 131 (L) 09/07/2020   K 4.1 09/07/2020   CL 98 09/07/2020   CO2 22 09/07/2020    Lab Results  Component Value Date   ALT 21 09/04/2020   AST 42 (H) 09/04/2020   ALKPHOS 111 09/04/2020   BILITOT 0.7 09/04/2020     Microbiology: Recent Results (from the past 240 hour(s))  Resp Panel by RT-PCR (Flu A&B, Covid) Nasopharyngeal Swab     Status: None   Collection Time: 08/31/20  8:47 AM   Specimen: Nasopharyngeal Swab; Nasopharyngeal(NP) swabs in vial transport medium  Result Value Ref Range Status   SARS Coronavirus 2 by RT PCR NEGATIVE NEGATIVE Final    Comment: (NOTE) SARS-CoV-2 target nucleic acids are NOT DETECTED.  The SARS-CoV-2 RNA is generally detectable in upper respiratory specimens during the acute phase of infection. The lowest  concentration of SARS-CoV-2 viral copies this assay can detect is 138 copies/mL. A negative result does not preclude SARS-Cov-2 infection and should not be used as the sole basis for treatment or other patient management decisions. A negative result may occur with  improper specimen collection/handling, submission of specimen other than nasopharyngeal swab, presence of viral mutation(s) within the areas targeted by this assay, and inadequate number of viral copies(<138 copies/mL). A negative result must be combined with clinical observations, patient  history, and epidemiological information. The expected result is Negative.  Fact Sheet for Patients:  BloggerCourse.com  Fact Sheet for Healthcare Providers:  SeriousBroker.it  This test is no t yet approved or cleared by the [place] FDA and  has been authorized for detection and/or diagnosis of SARS-CoV-2 by FDA under an Emergency Use Authorization (EUA). This EUA will remain  in effect (meaning this test can be used) for the duration of the COVID-19 declaration under Section 564(b)(1) of the Act, 21 U.S.C.section 360bbb-3(b)(1), unless the authorization is terminated  or revoked sooner.       Influenza A by PCR NEGATIVE NEGATIVE Final   Influenza B by PCR NEGATIVE NEGATIVE Final    Comment: (NOTE) The Xpert Xpress SARS-CoV-2/FLU/RSV plus assay is intended as an aid in the diagnosis of influenza from Nasopharyngeal swab specimens and should not be used as a sole basis for treatment. Nasal washings and aspirates are unacceptable for Xpert Xpress SARS-CoV-2/FLU/RSV testing.  Fact Sheet for Patients: BloggerCourse.com  Fact Sheet for Healthcare Providers: SeriousBroker.it  This test is not yet approved or cleared by the [place] FDA and has been authorized for detection and/or diagnosis of SARS-CoV-2 by FDA under an Emergency Use Authorization (EUA). This EUA will remain in effect (meaning this test can be used) for the duration of the COVID-19 declaration under Section 564(b)(1) of the Act, 21 U.S.C. section 360bbb-3(b)(1), unless the authorization is terminated or revoked.  Performed at [facility], [address] [place], [place] [address]   Blood Culture (routine x 2)     Status: None   Collection Time: 08/31/20  8:47 AM   Specimen: BLOOD  Result Value Ref Range Status   Specimen Description BLOOD LEFT ARM  Final   Special Requests   Final     BOTTLES DRAWN AEROBIC AND ANAEROBIC Blood Culture results may not be optimal due to an excessive volume of blood received in culture bottles   Culture   Final    NO GROWTH 5 DAYS Performed at [facility], [address] [place], [place] [address]    Report Status 09/05/2020 FINAL  Final  Blood Culture (routine x 2)     Status: None   Collection Time: 08/31/20  8:47 AM   Specimen: BLOOD  Result Value Ref Range Status   Specimen Description BLOOD RIGHT ARM  Final   Special Requests   Final    BOTTLES DRAWN AEROBIC AND ANAEROBIC Blood Culture results may not be optimal due to an excessive volume of blood received in culture bottles   Culture   Final    NO GROWTH 5 DAYS Performed at [facility], [address] [place], [place] [address]    Report Status 09/05/2020 FINAL  Final  Urine culture     Status: Abnormal   Collection Time: 08/31/20  8:47 AM   Specimen: In/Out Cath Urine  Result Value Ref Range Status   Specimen Description   Final    IN/OUT CATH URINE Performed at [facility], [address] [place], [place] [address]    Special Requests   Final    NONE Performed at [facility], [address] [place], [place] [address]    Culture 60,000 COLONIES/mL ESCHERICHIA COLI (A)  Final   Report Status 09/02/2020 FINAL  Final   Organism ID, Bacteria ESCHERICHIA COLI (A)  Final      Susceptibility   Escherichia coli - MIC*    AMPICILLIN 8 SENSITIVE Sensitive     CEFAZOLIN <=4 SENSITIVE Sensitive     CEFEPIME <=0.12 SENSITIVE Sensitive     CEFTRIAXONE <=0.25 SENSITIVE Sensitive     CIPROFLOXACIN <=0.25 SENSITIVE Sensitive     GENTAMICIN <=1 SENSITIVE Sensitive     IMIPENEM <=0.25 SENSITIVE Sensitive     NITROFURANTOIN <=16 SENSITIVE Sensitive     TRIMETH/SULFA <=20 SENSITIVE Sensitive     AMPICILLIN/SULBACTAM <=2 SENSITIVE Sensitive     PIP/TAZO <=4 SENSITIVE Sensitive     * 60,000 COLONIES/mL ESCHERICHIA COLI  Aerobic/Anaerobic  Culture w Gram Stain (surgical/deep wound)     Status: None   Collection Time: 08/31/20  2:51 PM   Specimen: PATH Cytology Pleural fluid  Result Value Ref Range Status   Specimen Description   Final    PLEURAL Performed at [facility], [address] [place], [place] [address]    Special Requests   Final    NONE Performed at [facility], [address] [place], [place] [address]    Gram Stain   Final    ABUNDANT WBC PRESENT, PREDOMINANTLY PMN FEW GRAM POSITIVE COCCI IN PAIRS IN CLUSTERS    Culture   Final    MODERATE METHICILLIN RESISTANT STAPHYLOCOCCUS AUREUS NO ANAEROBES ISOLATED Performed at [facility], [address] [place], [place] [address]    Report Status 09/05/2020 FINAL  Final   Organism ID, Bacteria METHICILLIN RESISTANT STAPHYLOCOCCUS AUREUS  Final      Susceptibility   Methicillin resistant staphylococcus aureus - MIC*    CIPROFLOXACIN >=8 RESISTANT Resistant     ERYTHROMYCIN >=8 RESISTANT Resistant     GENTAMICIN <=0.5 SENSITIVE Sensitive     OXACILLIN >=4 RESISTANT Resistant     TETRACYCLINE <=1 SENSITIVE Sensitive     VANCOMYCIN 1 SENSITIVE Sensitive     TRIMETH/SULFA <=10 SENSITIVE Sensitive     CLINDAMYCIN >=8 RESISTANT Resistant     RIFAMPIN <=0.5 SENSITIVE Sensitive     Inducible Clindamycin NEGATIVE Sensitive     * MODERATE METHICILLIN RESISTANT STAPHYLOCOCCUS AUREUS  Culture, blood (Routine X 2) w Reflex to ID Panel     Status: None (Preliminary result)   Collection Time: 09/05/20  4:29 PM   Specimen: BLOOD  Result Value Ref Range Status   Specimen Description BLOOD BLOOD RIGHT HAND  Final   Special Requests   Final    BOTTLES DRAWN AEROBIC AND ANAEROBIC Blood Culture adequate volume   Culture   Final    NO GROWTH 2 DAYS Performed at [facility], [address] [place], [place] [address]    Report Status PENDING  Incomplete  Culture, blood (Routine X 2) w Reflex to ID Panel     Status: None  (Preliminary result)   Collection Time: 09/05/20  4:30 PM   Specimen: BLOOD  Result Value Ref Range Status   Specimen Description BLOOD BLOOD LEFT HAND  Final   Special Requests   Final    BOTTLES DRAWN AEROBIC AND ANAEROBIC Blood Culture adequate volume   Culture  Final    NO GROWTH 2 DAYS Performed at [facility], [address] [place], [place] [address]    Report Status PENDING  Incomplete  MRSA PCR Screening     Status: Abnormal   Collection Time: 09/06/20 12:47 PM   Specimen: Nasal Mucosa; Nasopharyngeal  Result Value Ref Range Status   MRSA by PCR POSITIVE (A) NEGATIVE Final    Comment:        The GeneXpert MRSA Assay (FDA approved for NASAL specimens only), is one component of a comprehensive MRSA colonization surveillance program. It is not intended to diagnose MRSA infection nor to guide or monitor treatment for MRSA infections. RESULT CALLED TO, READ BACK BY AND VERIFIED WITH: ERICA HOPKINS AT 1505 ON 09/06/20 BY SS Performed at [facility], [address], [place], [place] [address]     Serology:  Imaging: If present, new imagings (plain films, ct scans, and mri) have been personally visualized and interpreted; radiology reports have been reviewed. Decision making incorporated into the Impression / Recommendations.  3/09 cxr Right chest tube in stable position. Tiny residual right pleural effusion, improved from prior exam. No pneumothorax. Mild right base atelectasis. Improved aeration from prior exam.  3/05 chest ct 1. The suspected right empyema persists but has significantly improved, much smaller in size, in the interval. 2. Probable reactive nodes in the precarinal region and right infrahilar region. 3. Mild opacities in the bases worrisome for a mild stable infectious process. 4. Mild emphysema. 5. 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 6. Stable 6 mm nodule in the left base, unchanged  since 2010 based on the previous CT report, consistent with a benign nodule. 7. Mild atherosclerotic changes in the thoracic aorta. Coronary artery disease.  Raymondo Band, MD [facility] [phone] pager    09/07/2020, 8:51 AM

## 2020-09-07 NOTE — Progress Notes (Signed)
D/C'd Right Chest Tube.  Patient tolerated well.

## 2020-09-07 NOTE — Progress Notes (Signed)
Patient ID: Nicole OlszewskiKimberly Dawn [name], female   DOB: [birth year], 60 y.o.   MRN: [id number]  PROGRESS NOTE    Nicole [name]  WUJ:[id number] DOB: [birth year] DOA: 09/06/2020 PCP: [facility]   Brief Narrative:  60 year old female with history of hypertension, tobacco abuse, anxiety, depression, asthma, hepatitis C was admitted to [facility] on 08/31/2020 with worsening cough and shortness of breath and was found to have large loculated right hydropneumothorax and CT of the chest was also suspicious for empyema.  Chest tube was placed by interventional radiology and general surgery was consulted.  Repeat CT chest on 09/03/2020 showed improvement of erythema.  She was treated with IV antibiotics.  Pleural fluid culture grew MRSA.  ID was consulted who recommended transfer to [facility] for [facility] surgery evaluation.  She was [facility] on 09/06/2020.  Assessment & Plan:   Right-sided empyema with hydropneumothorax due to MRSA Right lower lobe community-acquired pneumonia due to MRSA -Status post chest tube placement at [facility]: Pleural fluid grew MRSA.  Patient was initially treated with IV antibiotics but switched to oral Zyvox by infectious diseases at [facility]. -She was transferred to [facility] on 09/06/2020 as per ID recommendations. -CT surgery evaluation appreciated: No need for surgical intervention and chest tube will probably be removed.  Continue antibiotic course as per ID.  Hypertension -Continue amlodipine.  Monitor blood pressure  Ongoing tobacco use  -Counseled regarding cessation.  Continue nicotine patch  Anxiety/depression -Avoid SSRI.  Continue as needed Ativan.  Outpatient follow-up  Uncomplicated E. coli UTI -Antibiotics treatment completed at [facility]  Severe protein calorie malnutrition -Follow nutrition recommendations    DVT prophylaxis: Lovenox Code Status: Full Family Communication: None at  bedside Disposition Plan: Status is: Inpatient  Remains inpatient appropriate because:Inpatient level of care appropriate due to severity of illness   Dispo: The patient is from: Home              Anticipated d/c is to: Home in 1 to 2 days once clinically improved              Patient currently is not medically stable to d/c.   Difficult to place patient No   Consultants: ID/CT surgery  Procedures: Chest [facility]  Antimicrobials:  Anti-infectives (From admission, onward)   Start     Dose/Rate Route Frequency Ordered Stop   09/06/20 2200  linezolid (ZYVOX) tablet 600 mg        600 mg Oral Every 12 hours 09/06/20 1757         Subjective: Patient seen and examined at bedside.  Denies worsening shortness of breath, fever or vomiting.  Objective: Vitals:   09/06/20 1528 09/06/20 1952 09/07/20 0512 09/07/20 0746  BP: (!) 138/93 (!) 147/96 139/88 (!) 141/106  Pulse: 72 86 77   Resp: 16 20 20 20   Temp: 97.8 F (36.6 C) 97.8 F (36.6 C) 97.7 F (36.5 C) 97.8 F (36.6 C)  TempSrc: Oral Axillary Oral Oral  SpO2: 98% 98% 99% 99%    Intake/Output Summary (Last 24 hours) at 09/07/2020 1014 Last data filed at 09/07/2020 0700 Gross per 24 hour  Intake 120 ml  Output 0 ml  Net 120 ml   There were no vitals filed for this visit.  Examination:  General exam: Appears calm and comfortable.  Looks chronically ill.  Currently on room air Respiratory system: Bilateral decreased breath sounds at bases with scattered crackles with  right-sided chest tube present Cardiovascular system: S1 & S2 heard, Rate controlled Gastrointestinal system: Abdomen is nondistended, soft and nontender. Normal bowel sounds heard. Extremities: No cyanosis, clubbing, edema  Central nervous system: Alert and oriented. No focal neurological deficits. Moving extremities Skin: No rashes, lesions or ulcers Psychiatry: Flat affect    Data Reviewed: I have personally reviewed following labs and  imaging studies  CBC: Recent Labs  Lab 09/01/20 0523 09/04/20 0513 09/06/20 0742 09/07/20 0238  WBC 5.9 7.0 9.7 10.0  NEUTROABS  --  3.6 4.9  --   HGB 10.4* 11.6* 12.6 11.4*  HCT 31.3* 34.1* 37.2 34.6*  MCV 96.0 94.5 95.4 98.0  PLT 372 419* 436* 395   Basic Metabolic Panel: Recent Labs  Lab 09/01/20 0523 09/04/20 0513 09/05/20 0513 09/06/20 0742 09/07/20 0238  NA 133* 136  --  136 131*  K 3.7 4.3  --  4.4 4.1  CL 106 106  --  103 98  CO2 21* 23  --  25 22  GLUCOSE 125* 107*  --  111* 98  BUN 13 10  --  18 20  CREATININE 1.09* 0.89 0.97 1.11* 1.24*  CALCIUM 8.7* 9.3  --  9.5 9.6  MG  --   --   --  2.1  --   PHOS  --   --   --  3.7  --    GFR: Estimated Creatinine Clearance: 34.8 mL/min (A) (by C-G formula based on SCr of 1.24 mg/dL (H)). Liver Function Tests: Recent Labs  Lab 09/04/20 0513  AST 42*  ALT 21  ALKPHOS 111  BILITOT 0.7  PROT 7.4  ALBUMIN 2.3*   No results for input(s): LIPASE, AMYLASE in the last 168 hours. No results for input(s): AMMONIA in the last 168 hours. Coagulation Profile: No results for input(s): INR, PROTIME in the last 168 hours. Cardiac Enzymes: No results for input(s): CKTOTAL, CKMB, CKMBINDEX, TROPONINI in the last 168 hours. BNP (last 3 results) No results for input(s): PROBNP in the last 8760 hours. HbA1C: No results for input(s): HGBA1C in the last 72 hours. CBG: No results for input(s): GLUCAP in the last 168 hours. Lipid Profile: No results for input(s): CHOL, HDL, LDLCALC, TRIG, CHOLHDL, LDLDIRECT in the last 72 hours. Thyroid Function Tests: No results for input(s): TSH, T4TOTAL, FREET4, T3FREE, THYROIDAB in the last 72 hours. Anemia Panel: No results for input(s): VITAMINB12, FOLATE, FERRITIN, TIBC, IRON, RETICCTPCT in the last 72 hours. Sepsis Labs: No results for input(s): PROCALCITON, LATICACIDVEN in the last 168 hours.  Recent Results (from the past 240 hour(s))  Resp Panel by RT-PCR (Flu A&B, Covid)  Nasopharyngeal Swab     Status: None   Collection Time: 08/31/20  8:47 AM   Specimen: Nasopharyngeal Swab; Nasopharyngeal(NP) swabs in vial transport medium  Result Value Ref Range Status   SARS Coronavirus 2 by RT PCR NEGATIVE NEGATIVE Final    Comment: (NOTE) SARS-CoV-2 target nucleic acids are NOT DETECTED.  The SARS-CoV-2 RNA is generally detectable in upper respiratory specimens during the acute phase of infection. The lowest concentration of SARS-CoV-2 viral copies this assay can detect is 138 copies/mL. A negative result does not preclude SARS-Cov-2 infection and should not be used as the sole basis for treatment or other patient management decisions. A negative result may occur with  improper specimen collection/handling, submission of specimen other than nasopharyngeal swab, presence of viral mutation(s) within the areas targeted by this assay, and inadequate number of viral copies(<138 copies/mL). A  negative result must be combined with clinical observations, patient history, and epidemiological information. The expected result is Negative.  Fact Sheet for Patients:  BloggerCourse.com  Fact Sheet for Healthcare Providers:  SeriousBroker.it  This test is no t yet approved or cleared by the [place] FDA and  has been authorized for detection and/or diagnosis of SARS-CoV-2 by FDA under an Emergency Use Authorization (EUA). This EUA will remain  in effect (meaning this test can be used) for the duration of the COVID-19 declaration under Section 564(b)(1) of the Act, 21 U.S.C.section 360bbb-3(b)(1), unless the authorization is terminated  or revoked sooner.       Influenza A by PCR NEGATIVE NEGATIVE Final   Influenza B by PCR NEGATIVE NEGATIVE Final    Comment: (NOTE) The Xpert Xpress SARS-CoV-2/FLU/RSV plus assay is intended as an aid in the diagnosis of influenza from Nasopharyngeal swab specimens and should not be  used as a sole basis for treatment. Nasal washings and aspirates are unacceptable for Xpert Xpress SARS-CoV-2/FLU/RSV testing.  Fact Sheet for Patients: BloggerCourse.com  Fact Sheet for Healthcare Providers: SeriousBroker.it  This test is not yet approved or cleared by the [place] FDA and has been authorized for detection and/or diagnosis of SARS-CoV-2 by FDA under an Emergency Use Authorization (EUA). This EUA will remain in effect (meaning this test can be used) for the duration of the COVID-19 declaration under Section 564(b)(1) of the Act, 21 U.S.C. section 360bbb-3(b)(1), unless the authorization is terminated or revoked.  Performed at [facility], [address] [place], [place] [address]   Blood Culture (routine x 2)     Status: None   Collection Time: 08/31/20  8:47 AM   Specimen: BLOOD  Result Value Ref Range Status   Specimen Description BLOOD LEFT ARM  Final   Special Requests   Final    BOTTLES DRAWN AEROBIC AND ANAEROBIC Blood Culture results may not be optimal due to an excessive volume of blood received in culture bottles   Culture   Final    NO GROWTH 5 DAYS Performed at [facility], [address] [place], [place] [address]    Report Status 09/05/2020 FINAL  Final  Blood Culture (routine x 2)     Status: None   Collection Time: 08/31/20  8:47 AM   Specimen: BLOOD  Result Value Ref Range Status   Specimen Description BLOOD RIGHT ARM  Final   Special Requests   Final    BOTTLES DRAWN AEROBIC AND ANAEROBIC Blood Culture results may not be optimal due to an excessive volume of blood received in culture bottles   Culture   Final    NO GROWTH 5 DAYS Performed at [facility], [address], [place], [place] [address]    Report Status 09/05/2020 FINAL  Final  Urine culture     Status: Abnormal   Collection Time: 08/31/20  8:47 AM   Specimen: In/Out Cath Urine  Result  Value Ref Range Status   Specimen Description   Final    IN/OUT CATH URINE Performed at [facility], [address] [place], [place] [address]    Special Requests   Final    NONE Performed at [facility], [address] [place], [place] [address]    Culture 60,000 COLONIES/mL ESCHERICHIA COLI (A)  Final   Report Status 09/02/2020 FINAL  Final   Organism ID, Bacteria ESCHERICHIA COLI (A)  Final      Susceptibility   Escherichia coli -  MIC*    AMPICILLIN 8 SENSITIVE Sensitive     CEFAZOLIN <=4 SENSITIVE Sensitive     CEFEPIME <=0.12 SENSITIVE Sensitive     CEFTRIAXONE <=0.25 SENSITIVE Sensitive     CIPROFLOXACIN <=0.25 SENSITIVE Sensitive     GENTAMICIN <=1 SENSITIVE Sensitive     IMIPENEM <=0.25 SENSITIVE Sensitive     NITROFURANTOIN <=16 SENSITIVE Sensitive     TRIMETH/SULFA <=20 SENSITIVE Sensitive     AMPICILLIN/SULBACTAM <=2 SENSITIVE Sensitive     PIP/TAZO <=4 SENSITIVE Sensitive     * 60,000 COLONIES/mL ESCHERICHIA COLI  Aerobic/Anaerobic Culture w Gram Stain (surgical/deep wound)     Status: None   Collection Time: 08/31/20  2:51 PM   Specimen: PATH Cytology Pleural fluid  Result Value Ref Range Status   Specimen Description   Final    PLEURAL Performed at [facility], [address], [place], [place] [address]    Special Requests   Final    NONE Performed at [facility], [address] [place], [place] [address]    Gram Stain   Final    ABUNDANT WBC PRESENT, PREDOMINANTLY PMN FEW GRAM POSITIVE COCCI IN PAIRS IN CLUSTERS    Culture   Final    MODERATE METHICILLIN RESISTANT STAPHYLOCOCCUS AUREUS NO ANAEROBES ISOLATED Performed at [facility], [address], [place], [place] [address]    Report Status 09/05/2020 FINAL  Final   Organism ID, Bacteria METHICILLIN RESISTANT STAPHYLOCOCCUS AUREUS  Final      Susceptibility   Methicillin resistant staphylococcus aureus - MIC*    CIPROFLOXACIN >=8 RESISTANT  Resistant     ERYTHROMYCIN >=8 RESISTANT Resistant     GENTAMICIN <=0.5 SENSITIVE Sensitive     OXACILLIN >=4 RESISTANT Resistant     TETRACYCLINE <=1 SENSITIVE Sensitive     VANCOMYCIN 1 SENSITIVE Sensitive     TRIMETH/SULFA <=10 SENSITIVE Sensitive     CLINDAMYCIN >=8 RESISTANT Resistant     RIFAMPIN <=0.5 SENSITIVE Sensitive     Inducible Clindamycin NEGATIVE Sensitive     * MODERATE METHICILLIN RESISTANT STAPHYLOCOCCUS AUREUS  Culture, blood (Routine X 2) w Reflex to ID Panel     Status: None (Preliminary result)   Collection Time: 09/05/20  4:29 PM   Specimen: BLOOD  Result Value Ref Range Status   Specimen Description BLOOD BLOOD RIGHT HAND  Final   Special Requests   Final    BOTTLES DRAWN AEROBIC AND ANAEROBIC Blood Culture adequate volume   Culture   Final    NO GROWTH 2 DAYS Performed at [facility], [address], [place], [place] [address]    Report Status PENDING  Incomplete  Culture, blood (Routine X 2) w Reflex to ID Panel     Status: None (Preliminary result)   Collection Time: 09/05/20  4:30 PM   Specimen: BLOOD  Result Value Ref Range Status   Specimen Description BLOOD BLOOD LEFT HAND  Final   Special Requests   Final    BOTTLES DRAWN AEROBIC AND ANAEROBIC Blood Culture adequate volume   Culture   Final    NO GROWTH 2 DAYS Performed at [facility], [address], [place], [place] [address]    Report Status PENDING  Incomplete  MRSA PCR Screening     Status: Abnormal   Collection Time: 09/06/20 12:47 PM   Specimen: Nasal Mucosa; Nasopharyngeal  Result Value Ref Range Status   MRSA by PCR POSITIVE (A) NEGATIVE Final    Comment:  The GeneXpert MRSA Assay (FDA approved for NASAL specimens only), is one component of a comprehensive MRSA colonization surveillance program. It is not intended to diagnose MRSA infection nor to guide or monitor treatment for MRSA infections. RESULT CALLED TO, READ BACK BY AND VERIFIED  WITH: ERICA HOPKINS AT 1505 ON 09/06/20 BY SS Performed at [facility], [address], [place], [place] [address]          Radiology Studies: DG Chest 1 View  Result Date: 09/07/2020 CLINICAL DATA:  Chest tube. EXAM: CHEST  1 VIEW COMPARISON:  09/06/2020. FINDINGS: Right chest tube in stable position. Tiny residual right pleural effusion, improved from prior exam. No pneumothorax. Mild right base atelectasis. Improved aeration from prior exam. Heart size stable. IMPRESSION: Right chest tube in stable position. Tiny residual right pleural effusion, improved from prior exam. No pneumothorax. Mild right base atelectasis. Improved aeration from prior exam. Electronically Signed   By: Maisie Fus  Register   On: 09/07/2020 07:38   DG Chest 2 View  Result Date: 09/06/2020 CLINICAL DATA:  60 year old female status post right pleural pigtail drainage catheter placed 6 days ago for empyema. EXAM: CHEST - 2 VIEW COMPARISON:  Chest radiographs 09/05/2020 and earlier. FINDINGS: Stable right pleural catheter. No pneumothorax. Posterior and basilar loculated residual pleural fluid or thickening is stable to mildly regressed since yesterday, with resolved fluid in the right minor fissure. Patchy peripheral right lung opacity persists. Normal cardiac size and mediastinal contours. Visualized tracheal air column is within normal limits. Left lung appears stable and negative. Abdominal Calcified aortic atherosclerosis. Stable visualized osseous structures. Paucity of bowel gas IMPRESSION: 1. Stable right pleural catheter with stable to mildly regressed residual right pleural thickening/empyema since yesterday. Patchy peripheral right lung opacity persists. 2. No new cardiopulmonary abnormality. Electronically Signed   By: Odessa Fleming M.D.   On: 09/06/2020 06:59        Scheduled Meds: . amLODipine  10 mg Oral Daily  . Chlorhexidine Gluconate Cloth  6 each Topical Q0600  . enoxaparin (LOVENOX) injection  30 mg  Subcutaneous Q24H  . feeding supplement  237 mL Oral BID BM  . linezolid  600 mg Oral Q12H  . melatonin  3 mg Oral QHS  . mupirocin ointment  1 application Nasal BID  . nicotine  14 mg Transdermal Daily   Continuous Infusions:        Glade Lloyd, MD [facility] 09/07/2020, 10:14 AM

## 2020-09-07 NOTE — Progress Notes (Signed)
  Echocardiogram 2D Echocardiogram has been performed.  Gerda Diss 09/07/2020, 4:40 PM

## 2020-09-07 NOTE — Consult Note (Signed)
[address]       [place] [address]             [phone]      Cardiothoracic Surgery Consultation  Reason for Consult: Right empyema Referring Physician: Dr. Corene Cornea Letia Guidry is an 60 y.o. female.  HPI:   The patient is a 60 year old woman with a history of hypertension, heavy smoking with COPD, collagen vascular disease, anxiety/depression, who was apparently transferred from [facility] for surgical evaluation of a right empyema.  The patient reports that she was sick for 1-1/2 months prior to presenting to [facility] with cough, anorexia, weight loss, fever and chills and right-sided chest pain..  CT of the chest on presentation showed a large loculated right pleural fluid collection with right lung pneumonia.  She had a pigtail catheter placed in the right pleural fluid collection by interventional radiology on 08/31/2020.  Cultures of the fluid grew MRSA.  A follow-up CT scan of the chest on 09/03/2020 showed good drainage of the right empyema with a small residual fluid collection.  She has improved since drainage of the fluid collection and is afebrile with a normal white blood cell count.  She says that she feels much better.  She was seen by infectious disease who recommended the patient be transferred to [facility] for thoracotomy and decortication.  She was kept n.p.o. by hospital service and I was called last night to see her.  Past Medical History:  Diagnosis Date  . Anxiety   . Asthma   . Collagen vascular disease (HCC)   . COPD (chronic obstructive pulmonary disease) (HCC)   . Hepatitis C   . Hypertension     Past Surgical History:  Procedure Laterality Date  . ABDOMINAL HYSTERECTOMY    . FOOT SURGERY      Family History  Problem Relation Age of Onset  . Rheum arthritis Other   . Osteoporosis Other   . Clotting disorder Other   . Stroke Other     Social History:  reports that she has been smoking cigarettes. She  has been smoking about 1.00 pack per day. She has never used smokeless tobacco. She reports current alcohol use. She reports that she does not use drugs.  Allergies: No Known Allergies  Medications:  I have reviewed the patient's current medications. Prior to Admission:  Medications Prior to Admission  Medication Sig Dispense Refill Last Dose  . acetaminophen (TYLENOL) 325 MG tablet Take 2 tablets (650 mg total) by mouth every 6 (six) hours as needed for mild pain (or Fever >/= 101).     Marland Kitchen amLODipine (NORVASC) 10 MG tablet Take 1 tablet (10 mg total) by mouth daily.     Marland Kitchen aspirin 81 MG EC tablet Take 1 tablet (81 mg total) by mouth daily. (Patient not taking: Reported on 08/31/2020) 30 tablet 0   . carvedilol (COREG) 12.5 MG tablet Take 1 tablet (12.5 mg total) by mouth 2 (two) times daily with a meal. (Patient not taking: Reported on 08/31/2020) 60 tablet 1   . feeding supplement (ENSURE ENLIVE / ENSURE PLUS) LIQD Take 237 mLs by mouth 2 (two) times daily between meals. 237 mL 0   . HYDROcodone-acetaminophen (NORCO/VICODIN) 5-325 MG tablet Take 1 tablet by mouth every 4 (four) hours as needed for moderate pain or severe pain. 30 tablet 0   . linezolid (ZYVOX) 600 MG tablet Take 1 tablet (600 mg total) by mouth every 12 (  twelve) hours.     Marland Kitchen. lisinopril (ZESTRIL) 10 MG tablet Take 1 tablet (10 mg total) by mouth daily. (Patient not taking: Reported on 08/31/2020) 30 tablet 1   . LORazepam (ATIVAN) 1 MG tablet Take 1 tablet (1 mg total) by mouth every 6 (six) hours as needed for anxiety. 30 tablet 0   . Multiple Vitamin (MULTIVITAMIN WITH MINERALS) TABS tablet Take 1 tablet by mouth daily. (Patient not taking: Reported on 08/31/2020) 30 tablet 0   . nicotine (NICODERM CQ - DOSED IN MG/24 HOURS) 14 mg/24hr patch Place 1 patch (14 mg total) onto the skin daily. 28 patch 0   . traZODone (DESYREL) 50 MG tablet Take 1 tablet (50 mg total) by mouth at bedtime as needed for sleep.      Scheduled: . amLODipine   10 mg Oral Daily  . enoxaparin (LOVENOX) injection  30 mg Subcutaneous Q24H  . feeding supplement  237 mL Oral BID BM  . linezolid  600 mg Oral Q12H  . melatonin  3 mg Oral QHS  . nicotine  14 mg Transdermal Daily   Continuous:  ZOX:[id number]:acetaminophen, LORazepam, morphine injection, ondansetron (ZOFRAN) IV, oxyCODONE Anti-infectives (From admission, onward)   Start     Dose/Rate Route Frequency Ordered Stop   09/06/20 2200  linezolid (ZYVOX) tablet 600 mg        600 mg Oral Every 12 hours 09/06/20 1757        Results for orders placed or performed during the hospital encounter of 09/06/20 (from the past 48 hour(s))  CBC     Status: Abnormal   Collection Time: 09/07/20  2:38 AM  Result Value Ref Range   WBC 10.0 4.0 - 10.5 K/uL   RBC 3.53 (L) 3.87 - 5.11 MIL/uL   Hemoglobin 11.4 (L) 12.0 - 15.0 g/dL   HCT 14.734.6 (L) 82.936.0 - 56.246.0 %   MCV 98.0 80.0 - 100.0 fL   MCH 32.3 26.0 - 34.0 pg   MCHC 32.9 30.0 - 36.0 g/dL   RDW 13.016.5 (H) 86.511.5 - 78.415.5 %   Platelets 395 150 - 400 K/uL   nRBC 0.0 0.0 - 0.2 %    Comment: Performed at [facility], [address] [place], [place] [address]  Basic metabolic panel     Status: Abnormal   Collection Time: 09/07/20  2:38 AM  Result Value Ref Range   Sodium 131 (L) 135 - 145 mmol/L   Potassium 4.1 3.5 - 5.1 mmol/L   Chloride 98 98 - 111 mmol/L   CO2 22 22 - 32 mmol/L   Glucose, Bld 98 70 - 99 mg/dL    Comment: Glucose reference range applies only to samples taken after fasting for at least 8 hours.   BUN 20 6 - 20 mg/dL   Creatinine, Ser 5.281.24 (H) 0.44 - 1.00 mg/dL   Calcium 9.6 8.9 - 41.310.3 mg/dL   GFR, Estimated 51 (L) >60 mL/min    Comment: (NOTE) Calculated using the CKD-EPI Creatinine Equation (2021)    Anion gap 11 5 - 15    Comment: Performed at [facility], [address] [place], [place] [address]    DG Chest 2 View  Result Date: 09/06/2020 CLINICAL DATA:  60 year old female status post right pleural pigtail drainage catheter  placed 6 days ago for empyema. EXAM: CHEST - 2 VIEW COMPARISON:  Chest radiographs 09/05/2020 and earlier. FINDINGS: Stable right pleural catheter. No pneumothorax. Posterior and basilar loculated residual pleural fluid  or thickening is stable to mildly regressed since yesterday, with resolved fluid in the right minor fissure. Patchy peripheral right lung opacity persists. Normal cardiac size and mediastinal contours. Visualized tracheal air column is within normal limits. Left lung appears stable and negative. Abdominal Calcified aortic atherosclerosis. Stable visualized osseous structures. Paucity of bowel gas IMPRESSION: 1. Stable right pleural catheter with stable to mildly regressed residual right pleural thickening/empyema since yesterday. Patchy peripheral right lung opacity persists. 2. No new cardiopulmonary abnormality. Electronically Signed   By: Odessa Fleming M.D.   On: 09/06/2020 06:59   DG Chest 2 View  Result Date: 09/05/2020 CLINICAL DATA:  Right-sided chest tube. EXAM: CHEST - 2 VIEW COMPARISON:  09/02/2020; 09/01/2020; 08/31/2020; chest CT-09/03/2020; 08/31/2020 FINDINGS: Grossly unchanged cardiac silhouette and mediastinal contours with atherosclerotic plaque within the thoracic aorta. There is persistent thickening the right paratracheal stripe secondary to prominent vasculature. Stable positioning of support apparatus. No pneumothorax. Unchanged small right-sided effusion with associated right basilar opacities. There is unchanged mild diffuse thickening of the pulmonary interstitium within the right lung base. The left lung remains well aerated. No definite evidence of edema. No acute osseous abnormalities. IMPRESSION: 1. Stable positioning of support apparatus. No pneumothorax. 2. Unchanged small right-sided effusion and associated right basilar infiltrates. Electronically Signed   By: Simonne Come M.D.   On: 09/05/2020 08:07    Review of Systems  Constitutional: Positive for appetite change,  chills, fatigue, fever and unexpected weight change.  Respiratory: Positive for cough and shortness of breath.   Cardiovascular: Positive for chest pain.  Psychiatric/Behavioral: The patient is nervous/anxious.    All of these have improved since admission on antibiotics and drainage of the empyema  Blood pressure 139/88, pulse 77, temperature 97.7 F (36.5 C), temperature source Oral, resp. rate 20, SpO2 99 %. Physical Exam Constitutional:      Comments: Thin frail appearing woman, obviously malnourished  Cardiovascular:     Rate and Rhythm: Normal rate and regular rhythm.     Heart sounds: Normal heart sounds. No murmur heard.   Pulmonary:     Effort: Pulmonary effort is normal.     Breath sounds: No wheezing or rhonchi.     Comments: Good breath sounds bilaterally which are slightly decreased at the right base. Abdominal:     General: Abdomen is flat. Bowel sounds are normal.     Palpations: Abdomen is soft.  Neurological:     Mental Status: She is alert.     Assessment/Plan:  This 60 year old woman presented with MRSA pneumonia and right empyema which has been well drained with a pigtail catheter.  Her follow-up CT from 09/03/2020 shows a small residual pleural fluid collection which certainly does not warrant thoracotomy and decortication.  Follow-up chest x-ray yesterday and today likewise shows a small amount of residual right pleural effusion.  The chest tube is reportedly not draining any fluid.  I think the best option at this point is to remove the tube and complete her antibiotic course per infectious disease.  I think there is a good chance that she will have no further problems with the residual pleural fluid as long as she completes her antibiotic course.  There is always a small chance that she could develop a recurrent enlarging right pleural fluid collection,  in which case she might require surgical drainage,  but I think the risk of that is low and certainly she does  not require surgical drainage at this time.  This is a good  thing because she is malnourished, only weighs 97 pounds and obviously has severe COPD given the appearance of her lungs on CT scan.  I discussed the importance of smoking cessation and getting as much nutrition as she can to resolve this problem and build her body back up.  I discontinued her n.p.o. order and start her back on regular diet.  Infectious disease will decide on her antibiotic course and the hospital service can decide when she can go home.  I called her husband and discussed all this with him.  Alleen Borne 09/07/2020, 7:11 AM

## 2020-09-08 ENCOUNTER — Other Ambulatory Visit: Payer: Self-pay | Admitting: Internal Medicine

## 2020-09-08 DIAGNOSIS — J948 Other specified pleural conditions: Secondary | ICD-10-CM

## 2020-09-08 MED ORDER — LISINOPRIL 10 MG PO TABS: 10.0000 mg | ORAL_TABLET | Freq: Every day | ORAL | 0 refills | Status: DC

## 2020-09-08 MED ORDER — ALBUTEROL SULFATE HFA 108 (90 BASE) MCG/ACT IN AERS: 2.0000 | INHALATION_SPRAY | Freq: Four times a day (QID) | RESPIRATORY_TRACT | 0 refills | Status: DC | PRN

## 2020-09-08 MED ORDER — LINEZOLID 600 MG PO TABS: 600.0000 mg | ORAL_TABLET | Freq: Two times a day (BID) | ORAL | 0 refills | Status: AC

## 2020-09-08 MED ORDER — OXYCODONE HCL 5 MG PO TABS
5.0000 mg | ORAL_TABLET | Freq: Four times a day (QID) | ORAL | 0 refills | Status: DC | PRN
Start: 2020-09-08 — End: 2021-10-03

## 2020-09-08 MED FILL — ALBUTEROL SULFATE HFA 108 (: 108 (90 BAS | 14 days supply | Qty: 18 | Fill #0

## 2020-09-08 MED FILL — oxyCODONE HCL 5 MG TABS: 5 | 3 days supply | Qty: 14 | Fill #0

## 2020-09-08 MED FILL — LISINOPRIL 10 MG TABS: 10 | 30 days supply | Qty: 30 | Fill #0

## 2020-09-08 MED FILL — LINEZOLID 600 MG TABS: 600 | 28 days supply | Qty: 56 | Fill #0

## 2020-09-08 NOTE — Plan of Care (Signed)

## 2020-09-08 NOTE — Progress Notes (Signed)
CSW met with pt prior to discharge.  TOC pharmacy filling prescriptions through [facility].  Pt is from [place] and already has information for [facility] free pharmacy moving forward.  She is current with [facility] for primary care and is also planning to call [facility] for mental health outpt treatment.  Her husband will pick her up today.  No other needs identified.

## 2020-09-08 NOTE — Plan of Care (Signed)
  Problem: Education: Goal: Knowledge of General Education information will improve Description: Including pain rating scale, medication(s)/side effects and non-pharmacologic comfort measures Outcome: Adequate for Discharge   Problem: Health Behavior/Discharge Planning: Goal: Ability to manage health-related needs will improve Outcome: Adequate for Discharge   Problem: Clinical Measurements: Goal: Ability to maintain clinical measurements within normal limits will improve Outcome: Adequate for Discharge Goal: Will remain free from infection Outcome: Adequate for Discharge Goal: Diagnostic test results will improve Outcome: Adequate for Discharge Goal: Respiratory complications will improve Outcome: Adequate for Discharge Goal: Cardiovascular complication will be avoided Outcome: Adequate for Discharge   Problem: Activity: Goal: Risk for activity intolerance will decrease Outcome: Adequate for Discharge   Problem: Nutrition: Goal: Adequate nutrition will be maintained Outcome: Adequate for Discharge   Problem: Coping: Goal: Level of anxiety will decrease Outcome: Adequate for Discharge   Problem: Pain Managment: Goal: General experience of comfort will improve Outcome: Adequate for Discharge   Problem: Elimination: Goal: Will not experience complications related to bowel motility Outcome: Adequate for Discharge Goal: Will not experience complications related to urinary retention Outcome: Adequate for Discharge   Problem: Safety: Goal: Ability to remain free from injury will improve Outcome: Adequate for Discharge   Problem: Skin Integrity: Goal: Risk for impaired skin integrity will decrease Outcome: Adequate for Discharge

## 2020-09-08 NOTE — Progress Notes (Signed)
[facility]  Date of Admission:  09/06/2020      Lines: 3/02-c Chest tube  Abx: 3/08-c linezolid  3/3-3/7 amp-sulb 3/2-3/3 azith  ASSESSMENT: Recurrent pna mrsa empyema, right side, s/p pigtail catheter assymptomatic bacteriuria ecoli, non-esbl  60 yr old woman smoker, copd, bipolar, admitted 3/02 to [facility] ED (transferred to [facility] on 3/8 for cts evaluation) with complaints of 4-6 weeks increasing shortness of breath, pleuritic chest pain, and cough productive of purulent sputum, found to have pna/right sided empyema with mrsa  3/02 and 3/07 Blood cx negative cts eval'ed: no decortication needed at this time. Serial imaging showed much improved empyema with chest tube placed 3/02. Doesn't appear fibrinolytic/dnase were ever used 3/02 pleural fluid cx mrsa Interestingly, clinically already improved before MRSA coverage added, albeit with chest tube placed    ----- 3/10 assessment tte done; mitral valve grossly normal with thickening. Would defer tee and repeat surveillance blood culture after treatment for pna (blood cx before abx was negative positive)  Can f/u with Dr Rivka Safer in [facility] id clinic  PLAN: 1. F/u cts 2. Id clinic f/u as below 3. Continue linezolid; plan 4 weeks from 3/08 until 4/05 (critical she keeps this appointment so labs can be checked to monitor for toxicity/improvement)  Clinic Follow Up Appt: Dr Rivka Safer on 3/17 @ 12  @  [facility] [address] [place], [place] [address] [phone]    Active Problems:   Empyema (HCC)   Scheduled Meds: . amLODipine  10 mg Oral Daily  . Chlorhexidine Gluconate Cloth  6 each Topical Q0600  . enoxaparin (LOVENOX) injection  30 mg Subcutaneous Q24H  . feeding supplement  237 mL Oral BID BM  . linezolid  600 mg Oral Q12H  . melatonin  3 mg Oral QHS  . mupirocin ointment  1 application Nasal BID  . nicotine  14 mg  Transdermal Daily   Continuous Infusions: PRN Meds:.acetaminophen, loratadine, LORazepam, morphine injection, ondansetron (ZOFRAN) IV, oxyCODONE   SUBJECTIVE: Chest tube removed No sob after Feeling well   Review of Systems: ROS Other ros negative  No Known Allergies  OBJECTIVE: Vitals:   09/07/20 0746 09/07/20 1931 09/08/20 0508 09/08/20 0925  BP: (!) 141/106 126/78 116/72 (!) 135/96  Pulse:  84 90 93  Resp: 20 20 20 16   Temp: 97.8 F (36.6 C) 97.7 F (36.5 C) 98.2 F (36.8 C) 98.5 F (36.9 C)  TempSrc: Oral Oral Oral Oral  SpO2: 99% 98% 98% 97%  Weight:  50.5 kg     Body mass index is 19.72 kg/m.  Physical Exam No distress, conversant, thin Heent: normocephalic; per; conj clear Neck supple cv rrr no mrg Lungs clear; normal respiratory effort abd s/nt Ext no edema Skin no rash Neuro nonfocal  Lab Results Lab Results  Component Value Date   WBC 10.0 09/07/2020   HGB 11.4 (L) 09/07/2020   HCT 34.6 (L) 09/07/2020   MCV 98.0 09/07/2020   PLT 395 09/07/2020    Lab Results  Component Value Date   CREATININE 1.24 (H) 09/07/2020   BUN 20 09/07/2020   NA 131 (L) 09/07/2020   K 4.1 09/07/2020   CL 98 09/07/2020   CO2 22 09/07/2020    Lab Results  Component Value Date   ALT 21 09/04/2020   AST 42 (H) 09/04/2020   ALKPHOS 111 09/04/2020   BILITOT 0.7 09/04/2020  Microbiology: Recent Results (from the past 240 hour(s))  Resp Panel by RT-PCR (Flu A&B, Covid) Nasopharyngeal Swab     Status: None   Collection Time: 08/31/20  8:47 AM   Specimen: Nasopharyngeal Swab; Nasopharyngeal(NP) swabs in vial transport medium  Result Value Ref Range Status   SARS Coronavirus 2 by RT PCR NEGATIVE NEGATIVE Final    Comment: (NOTE) SARS-CoV-2 target nucleic acids are NOT DETECTED.  The SARS-CoV-2 RNA is generally detectable in upper respiratory specimens during the acute phase of infection. The lowest concentration of SARS-CoV-2 viral copies this assay can  detect is 138 copies/mL. A negative result does not preclude SARS-Cov-2 infection and should not be used as the sole basis for treatment or other patient management decisions. A negative result may occur with  improper specimen collection/handling, submission of specimen other than nasopharyngeal swab, presence of viral mutation(s) within the areas targeted by this assay, and inadequate number of viral copies(<138 copies/mL). A negative result must be combined with clinical observations, patient history, and epidemiological information. The expected result is Negative.  Fact Sheet for Patients:  BloggerCourse.com  Fact Sheet for Healthcare Providers:  SeriousBroker.it  This test is no t yet approved or cleared by the [place] FDA and  has been authorized for detection and/or diagnosis of SARS-CoV-2 by FDA under an Emergency Use Authorization (EUA). This EUA will remain  in effect (meaning this test can be used) for the duration of the COVID-19 declaration under Section 564(b)(1) of the Act, 21 U.S.C.section 360bbb-3(b)(1), unless the authorization is terminated  or revoked sooner.       Influenza A by PCR NEGATIVE NEGATIVE Final   Influenza B by PCR NEGATIVE NEGATIVE Final    Comment: (NOTE) The Xpert Xpress SARS-CoV-2/FLU/RSV plus assay is intended as an aid in the diagnosis of influenza from Nasopharyngeal swab specimens and should not be used as a sole basis for treatment. Nasal washings and aspirates are unacceptable for Xpert Xpress SARS-CoV-2/FLU/RSV testing.  Fact Sheet for Patients: BloggerCourse.com  Fact Sheet for Healthcare Providers: SeriousBroker.it  This test is not yet approved or cleared by the [place] FDA and has been authorized for detection and/or diagnosis of SARS-CoV-2 by FDA under an Emergency Use Authorization (EUA). This EUA will remain in  effect (meaning this test can be used) for the duration of the COVID-19 declaration under Section 564(b)(1) of the Act, 21 U.S.C. section 360bbb-3(b)(1), unless the authorization is terminated or revoked.  Performed at [facility], [address] [place], [place] [address]   Blood Culture (routine x 2)     Status: None   Collection Time: 08/31/20  8:47 AM   Specimen: BLOOD  Result Value Ref Range Status   Specimen Description BLOOD LEFT ARM  Final   Special Requests   Final    BOTTLES DRAWN AEROBIC AND ANAEROBIC Blood Culture results may not be optimal due to an excessive volume of blood received in culture bottles   Culture   Final    NO GROWTH 5 DAYS Performed at [facility], [address], [place], [place] [address]    Report Status 09/05/2020 FINAL  Final  Blood Culture (routine x 2)     Status: None   Collection Time: 08/31/20  8:47 AM   Specimen: BLOOD  Result Value Ref Range Status   Specimen Description BLOOD RIGHT ARM  Final   Special Requests   Final    BOTTLES DRAWN AEROBIC AND ANAEROBIC Blood Culture results may not be  optimal due to an excessive volume of blood received in culture bottles   Culture   Final    NO GROWTH 5 DAYS Performed at [facility], [address] [place], [place] [address]    Report Status 09/05/2020 FINAL  Final  Urine culture     Status: Abnormal   Collection Time: 08/31/20  8:47 AM   Specimen: In/Out Cath Urine  Result Value Ref Range Status   Specimen Description   Final    IN/OUT CATH URINE Performed at [facility], [address], [place], [place] [address]    Special Requests   Final    NONE Performed at [facility], [address] [place], [place] [address]    Culture 60,000 COLONIES/mL ESCHERICHIA COLI (A)  Final   Report Status 09/02/2020 FINAL  Final   Organism ID, Bacteria ESCHERICHIA COLI (A)  Final      Susceptibility   Escherichia coli - MIC*    AMPICILLIN 8  SENSITIVE Sensitive     CEFAZOLIN <=4 SENSITIVE Sensitive     CEFEPIME <=0.12 SENSITIVE Sensitive     CEFTRIAXONE <=0.25 SENSITIVE Sensitive     CIPROFLOXACIN <=0.25 SENSITIVE Sensitive     GENTAMICIN <=1 SENSITIVE Sensitive     IMIPENEM <=0.25 SENSITIVE Sensitive     NITROFURANTOIN <=16 SENSITIVE Sensitive     TRIMETH/SULFA <=20 SENSITIVE Sensitive     AMPICILLIN/SULBACTAM <=2 SENSITIVE Sensitive     PIP/TAZO <=4 SENSITIVE Sensitive     * 60,000 COLONIES/mL ESCHERICHIA COLI  Aerobic/Anaerobic Culture w Gram Stain (surgical/deep wound)     Status: None   Collection Time: 08/31/20  2:51 PM   Specimen: PATH Cytology Pleural fluid  Result Value Ref Range Status   Specimen Description   Final    PLEURAL Performed at [facility], [address] [place], [place] [address]    Special Requests   Final    NONE Performed at [facility], [address] [place], [place] [address]    Gram Stain   Final    ABUNDANT WBC PRESENT, PREDOMINANTLY PMN FEW GRAM POSITIVE COCCI IN PAIRS IN CLUSTERS    Culture   Final    MODERATE METHICILLIN RESISTANT STAPHYLOCOCCUS AUREUS NO ANAEROBES ISOLATED Performed at [facility], [address] [place], [place] [address]    Report Status 09/05/2020 FINAL  Final   Organism ID, Bacteria METHICILLIN RESISTANT STAPHYLOCOCCUS AUREUS  Final      Susceptibility   Methicillin resistant staphylococcus aureus - MIC*    CIPROFLOXACIN >=8 RESISTANT Resistant     ERYTHROMYCIN >=8 RESISTANT Resistant     GENTAMICIN <=0.5 SENSITIVE Sensitive     OXACILLIN >=4 RESISTANT Resistant     TETRACYCLINE <=1 SENSITIVE Sensitive     VANCOMYCIN 1 SENSITIVE Sensitive     TRIMETH/SULFA <=10 SENSITIVE Sensitive     CLINDAMYCIN >=8 RESISTANT Resistant     RIFAMPIN <=0.5 SENSITIVE Sensitive     Inducible Clindamycin NEGATIVE Sensitive     * MODERATE METHICILLIN RESISTANT STAPHYLOCOCCUS AUREUS  Culture, blood (Routine X 2) w Reflex to ID Panel      Status: None (Preliminary result)   Collection Time: 09/05/20  4:29 PM   Specimen: BLOOD  Result Value Ref Range Status   Specimen Description BLOOD BLOOD RIGHT HAND  Final   Special Requests   Final    BOTTLES DRAWN AEROBIC AND ANAEROBIC Blood Culture adequate volume   Culture   Final    NO GROWTH  3 DAYS Performed at [facility], [address] [place], [place] [id number]    Report Status PENDING  Incomplete  Culture, blood (Routine X 2) w Reflex to ID Panel     Status: None (Preliminary result)   Collection Time: 09/05/20  4:30 PM   Specimen: BLOOD  Result Value Ref Range Status   Specimen Description BLOOD BLOOD LEFT HAND  Final   Special Requests   Final    BOTTLES DRAWN AEROBIC AND ANAEROBIC Blood Culture adequate volume   Culture   Final    NO GROWTH 3 DAYS Performed at [facility], [address] [place], [place] [address]    Report Status PENDING  Incomplete  MRSA PCR Screening     Status: Abnormal   Collection Time: 09/06/20 12:47 PM   Specimen: Nasal Mucosa; Nasopharyngeal  Result Value Ref Range Status   MRSA by PCR POSITIVE (A) NEGATIVE Final    Comment:        The GeneXpert MRSA Assay (FDA approved for NASAL specimens only), is one component of a comprehensive MRSA colonization surveillance program. It is not intended to diagnose MRSA infection nor to guide or monitor treatment for MRSA infections. RESULT CALLED TO, READ BACK BY AND VERIFIED WITH: ERICA HOPKINS AT 1505 ON 09/06/20 BY SS Performed at [facility], [address] [place], [place] [address]     Serology:  Imaging: If present, new imagings (plain films, ct scans, and mri) have been personally visualized and interpreted; radiology reports have been reviewed. Decision making incorporated into the Impression / Recommendations.  3/09 cxr Right chest tube in stable position. Tiny residual right pleural effusion, improved from prior exam. No pneumothorax. Mild  right base atelectasis. Improved aeration from prior exam.  3/05 chest ct 1. The suspected right empyema persists but has significantly improved, much smaller in size, in the interval. 2. Probable reactive nodes in the precarinal region and right infrahilar region. 3. Mild opacities in the bases worrisome for a mild stable infectious process. 4. Mild emphysema. 5. 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 6. Stable 6 mm nodule in the left base, unchanged since 2010 based on the previous CT report, consistent with a benign nodule. 7. Mild atherosclerotic changes in the thoracic aorta. Coronary artery disease.  Raymondo Bandrung T Vu, MD [facility] [phone] pager    09/08/2020, 9:51 AM

## 2020-09-08 NOTE — Discharge Summary (Addendum)
Physician Discharge Summary  Nicole [name] YKD:[id number] DOB: Aug 06, 1960 DOA: 09/06/2020  PCP: [facility]  Admit date: 09/06/2020 Discharge date: 09/08/2020  Admitted From: Home Disposition: Home  Recommendations for Outpatient Follow-up:  1. Follow up with PCP in 1 week with repeat CBC/BMP 2. Outpatient follow-up with ID and CT surgery 3. Follow up in ED if symptoms worsen or new appear   Home Health: No Equipment/Devices: None  Discharge Condition: Stable CODE STATUS: Full Diet recommendation: Heart healthy  Brief/Interim Summary: 60 year old female with history of hypertension, tobacco abuse, anxiety, depression, asthma, hepatitis C was admitted to [facility] on 08/31/2020 with worsening cough and shortness of breath and was found to have large loculated right hydropneumothorax and CT of the chest was also suspicious for empyema.  Chest tube was placed by interventional radiology and general surgery was consulted.  Repeat CT chest on 09/03/2020 showed improvement of erythema.  She was treated with IV antibiotics.  Pleural fluid culture grew MRSA.  ID was consulted who recommended transfer to [facility] for CT surgery evaluation.  She was transferred to [facility] on 09/06/2020.  After CT surgery evaluation, chest tube was removed.  Her respiratory status has subsequently remained stable.  She is tolerating Zyvox and is currently on room air.  She will be discharged home on oral Zyvox with outpatient follow-up with ID and CT surgery.   Discharge Diagnoses:   Right-sided empyema with hydropneumothorax due to MRSA Right lower lobe community-acquired pneumonia due to MRSA -Status post chest tube placement at [facility]: Pleural fluid grew MRSA.  Patient was initially treated with IV antibiotics but switched to oral Zyvox by infectious diseases at [facility]. -She was transferred to [facility] on 09/06/2020 as per ID  recommendations. -CT surgery evaluation appreciated: No need for surgical intervention and chest tube will was subsequently removed.  -Her respiratory status has subsequently remained stable.  She is tolerating Zyvox and is currently on room air.  She will be discharged home today on oral Zyvox till 10/04/2020 with outpatient follow-up with CT surgery and ID.  Hypertension -Continue amlodipine.    Blood pressure on the higher side.  Resume lisinopril.  Apparently patient is currently not taking Coreg.  Outpatient follow-up with PCP.  Ongoing tobacco use  -Counseled regarding cessation.    Outpatient follow-up  Anxiety/depression -Avoid SSRI.  Outpatient follow-up  Uncomplicated E. coli UTI -Antibiotics treatment completed at [facility]  Severe protein calorie malnutrition -Follow nutrition recommendations   Discharge Instructions  Discharge Instructions    Ambulatory referral to Cardiothoracic Surgery   Complete by: As directed    Hospital follow-up   Ambulatory referral to Infectious Disease   Complete by: As directed    Hospital follow-up   Diet - low sodium heart healthy   Complete by: As directed    Increase activity slowly   Complete by: As directed      Allergies as of 09/08/2020   No Known Allergies     Medication List    STOP taking these medications   aspirin 81 MG EC tablet   carvedilol 12.5 MG tablet Commonly known as: COREG   feeding supplement Liqd   HYDROcodone-acetaminophen 5-325 MG tablet Commonly known as: NORCO/VICODIN   LORazepam 1 MG tablet Commonly known as: ATIVAN   multivitamin with minerals Tabs tablet   nicotine 14 mg/24hr patch Commonly known as: NICODERM CQ - dosed in mg/24 hours   traZODone 50 MG tablet Commonly known as:  DESYREL     TAKE these medications   acetaminophen 325 MG tablet Commonly known as: TYLENOL Take 2 tablets (650 mg total) by mouth every 6 (six) hours as needed for mild pain (or Fever >/= 101).   albuterol  108 (90 Base) MCG/ACT inhaler Commonly known as: VENTOLIN HFA Inhale 2 puffs into the lungs every 6 (six) hours as needed for wheezing or shortness of breath.   amLODipine 10 MG tablet Commonly known as: NORVASC Take 1 tablet (10 mg total) by mouth daily.   linezolid 600 MG tablet Commonly known as: ZYVOX Take 1 tablet (600 mg total) by mouth 2 (two) times daily for 28 days. What changed: when to take this   lisinopril 10 MG tablet Commonly known as: ZESTRIL Take 1 tablet (10 mg total) by mouth daily.   oxyCODONE 5 MG immediate release tablet Commonly known as: Oxy IR/ROXICODONE Take 1 tablet (5 mg total) by mouth every 6 (six) hours as needed for severe pain.        No Known Allergies  Consultations:  ID/CT surgery   Procedures/Studies: DG Chest 1 View  Result Date: 09/07/2020 CLINICAL DATA:  Chest tube. EXAM: CHEST  1 VIEW COMPARISON:  09/06/2020. FINDINGS: Right chest tube in stable position. Tiny residual right pleural effusion, improved from prior exam. No pneumothorax. Mild right base atelectasis. Improved aeration from prior exam. Heart size stable. IMPRESSION: Right chest tube in stable position. Tiny residual right pleural effusion, improved from prior exam. No pneumothorax. Mild right base atelectasis. Improved aeration from prior exam. Electronically Signed   By: Maisie Fus  Register   On: 09/07/2020 07:38   DG Chest 2 View  Result Date: 09/06/2020 CLINICAL DATA:  60 year old female status post right pleural pigtail drainage catheter placed 6 days ago for empyema. EXAM: CHEST - 2 VIEW COMPARISON:  Chest radiographs 09/05/2020 and earlier. FINDINGS: Stable right pleural catheter. No pneumothorax. Posterior and basilar loculated residual pleural fluid or thickening is stable to mildly regressed since yesterday, with resolved fluid in the right minor fissure. Patchy peripheral right lung opacity persists. Normal cardiac size and mediastinal contours. Visualized tracheal air  column is within normal limits. Left lung appears stable and negative. Abdominal Calcified aortic atherosclerosis. Stable visualized osseous structures. Paucity of bowel gas IMPRESSION: 1. Stable right pleural catheter with stable to mildly regressed residual right pleural thickening/empyema since yesterday. Patchy peripheral right lung opacity persists. 2. No new cardiopulmonary abnormality. Electronically Signed   By: Odessa Fleming M.D.   On: 09/06/2020 06:59   DG Chest 2 View  Result Date: 09/05/2020 CLINICAL DATA:  Right-sided chest tube. EXAM: CHEST - 2 VIEW COMPARISON:  09/02/2020; 09/01/2020; 08/31/2020; chest CT-09/03/2020; 08/31/2020 FINDINGS: Grossly unchanged cardiac silhouette and mediastinal contours with atherosclerotic plaque within the thoracic aorta. There is persistent thickening the right paratracheal stripe secondary to prominent vasculature. Stable positioning of support apparatus. No pneumothorax. Unchanged small right-sided effusion with associated right basilar opacities. There is unchanged mild diffuse thickening of the pulmonary interstitium within the right lung base. The left lung remains well aerated. No definite evidence of edema. No acute osseous abnormalities. IMPRESSION: 1. Stable positioning of support apparatus. No pneumothorax. 2. Unchanged small right-sided effusion and associated right basilar infiltrates. Electronically Signed   By: Simonne Come M.D.   On: 09/05/2020 08:07   DG Chest 2 View  Result Date: 09/02/2020 CLINICAL DATA:  Follow-up empyema. EXAM: CHEST - 2 VIEW COMPARISON:  Radiograph yesterday.  CT 08/31/2020 FINDINGS: Pigtail catheter in the right hemithorax,  unchanged in position. No visualized pneumothorax. Small residual pleural effusion blunting of the costophrenic angle, slightly improved from yesterday. No new airspace disease. Stable heart size and mediastinal contours. IMPRESSION: Slightly diminished right pleural effusion with pigtail catheter in place. No  visualized pneumothorax. Electronically Signed   By: Narda Rutherford M.D.   On: 09/02/2020 19:57   DG Chest 2 View  Result Date: 09/01/2020 CLINICAL DATA:  Right pleural effusion. Right chest tube. Cough and shortness of breath. EXAM: CHEST - 2 VIEW COMPARISON:  08/31/2020.  CT 08/31/2020. FINDINGS: Right chest tube in stable position. Small residual right pleural effusion. No pneumothorax. Continued improvement in aeration in the right lung base. Heart size normal. No acute bony abnormality. IMPRESSION: Right chest tube in stable position. Small residual right pleural effusion. No pneumothorax. Continued improvement in aeration in the right lung base. Electronically Signed   By: Maisie Fus  Register   On: 09/01/2020 09:40   DG Chest 2 View  Result Date: 08/31/2020 CLINICAL DATA:  Dyspnea, right rib pain EXAM: CHEST - 2 VIEW COMPARISON:  12/10/2018 FINDINGS: Large right posteriorly loculated hydropneumothorax has developed. Small free-flowing right pleural fluid component also noted. Mild left basilar pulmonary infiltrate is present, infection versus aspiration. No pneumothorax or pleural effusion on the left. Cardiac size within normal limits. No acute bone abnormality. IMPRESSION: Large, loculated right hydropneumothorax. Mild left basilar pulmonary infiltrate, infection versus aspiration. Electronically Signed   By: Helyn Numbers MD   On: 08/31/2020 07:12   CT CHEST W CONTRAST  Result Date: 09/03/2020 CLINICAL DATA:  Respiratory illness. Nondiagnostic x-ray. Follow-up right suspected empyema. EXAM: CT CHEST WITH CONTRAST TECHNIQUE: Multidetector CT imaging of the chest was performed during intravenous contrast administration. CONTRAST:  75mL OMNIPAQUE IOHEXOL 300 MG/ML  SOLN COMPARISON:  Chest x-ray September 02, 2020.  Chest CT August 31, 2020. FINDINGS: Cardiovascular: Atherosclerotic change seen in the thoracic aorta without dissection. The ascending thoracic aorta is mildly dilated measuring up to 4.2 cm near  its root. Calcified atherosclerosis in the LAD. The heart is otherwise normal in appearance. Central pulmonary arteries are poorly opacified normal in caliber. Mediastinum/Nodes: No left-sided pleural effusion. The thick walled fluid collection containing air on the comparison CT scan is partially collapsed, now containing a chest tube located within the fluid. There has been significant improvement in the size of the suspected empyema. No pericardial effusion. The chest wall is normal. The thyroid and esophagus are normal. Shotty nodes in the precarinal region are likely reactive. Shotty nodes in the right inferior hilum are likely reactive. Lungs/Pleura: Central airways are normal. Other than the air in the right remaining pleural collection, no air seen in the pleural spaces. Emphysematous changes are noted. Subpleural reticulations are stable. The left lower lobe 6 mm pulmonary nodule is without significant change. No new nodules or masses. No new infiltrates. Mild tree-in-bud opacity in the right lateral lung base remains. Mild opacity in the left lateral lung base remains. Upper Abdomen: No interval changes or acute abnormalities. Musculoskeletal: No chest wall abnormality. No acute or significant osseous findings. IMPRESSION: 1. The suspected right empyema persists but has significantly improved, much smaller in size, in the interval. 2. Probable reactive nodes in the precarinal region and right infrahilar region. 3. Mild opacities in the bases worrisome for a mild stable infectious process. 4. Mild emphysema. 5. 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 6. Stable 6 mm nodule in the left base, unchanged since 2010 based on the previous CT  report, consistent with a benign nodule. 7. Mild atherosclerotic changes in the thoracic aorta. Coronary artery disease. Aortic Atherosclerosis (ICD10-I70.0) and Emphysema (ICD10-J43.9). Electronically Signed   By: Gerome Sam III M.D    On: 09/03/2020 09:23   CT Chest W Contrast  Addendum Date: 08/31/2020   ADDENDUM REPORT: 08/31/2020 10:26 ADDENDUM: Excluded from the impression is the presence of a 4.2 cm ascending aortic aneurysm. Recommend annual imaging followup by CTA or MRA. This recommendation follows 2010 ACCF/AHA/AATS/ACR/ASA/SCA/SCAI/SIR/STS/SVM Guidelines for the Diagnosis and Management of Patients with Thoracic Aortic Disease. Circulation. 2010; 121: Z366-Y403 Electronically Signed   By: Carey Bullocks M.D.   On: 08/31/2020 10:26   Result Date: 08/31/2020 CLINICAL DATA:  Shortness of breath and right shoulder pain. Complex right-sided hydropneumothorax on radiographs, pneumonia or abscess suspected. EXAM: CT CHEST WITH CONTRAST TECHNIQUE: Multidetector CT imaging of the chest was performed during intravenous contrast administration. CONTRAST:  75mL OMNIPAQUE IOHEXOL 300 MG/ML  SOLN COMPARISON:  CT 08/21/2008.  Radiographs today and 12/10/2018. FINDINGS: Cardiovascular: Atherosclerosis of the aorta, great vessels and coronary arteries. There is dilatation of the ascending aorta to 4.2 cm. No evidence of acute pulmonary embolism. The heart size is normal. There is no pericardial effusion. Mediastinum/Nodes: There are no enlarged mediastinal, hilar or axillary lymph nodes.Small mediastinal and right hilar lymph nodes are likely reactive. The thyroid gland, trachea and esophagus demonstrate no significant findings. Lungs/Pleura: As demonstrated radiographically, there is a large complex posteriorly loculated hydropneumothorax on the right. Air-fluid level posteriorly in the pleural space measures 17.5 x 8.3 x 9.2 cm and has irregular thick walls, suspicious for empyema. There is compressive right lower lobe atelectasis, but no evidence of endobronchial lesion. There is scattered subpleural reticulation and ground-glass opacity within the aerated portions of the lungs. There is mild tree-in-bud nodularity in the right middle lobe. In  the left lower lobe, there is a solid pulmonary nodule measuring 6 mm on image 100/3 (previously 5 mm). Mild underlying emphysema. Upper abdomen:  The visualized upper abdomen appears unremarkable. Musculoskeletal/Chest wall: There is no chest wall mass or suspicious osseous finding. Degenerative changes in the lumbar spine with rightward listhesis of L1 relative to L2. IMPRESSION: 1. Large complex posteriorly loculated hydropneumothorax on the right with irregular thick walls, suspicious for empyema. Associated compressive right lower lobe atelectasis. 2. Scattered subpleural reticulation and ground-glass opacity within the aerated portions of the lungs, likely infectious/inflammatory. No consolidation. 3. 6 mm left lower lobe pulmonary nodule, minimally enlarged from 2010, consistent with a benign finding. 4. Aortic Atherosclerosis (ICD10-I70.0) and Emphysema (ICD10-J43.9). Electronically Signed: By: Carey Bullocks M.D. On: 08/31/2020 10:03   DG Chest Port 1 View  Result Date: 08/31/2020 CLINICAL DATA:  Chest tube placement. EXAM: PORTABLE CHEST 1 VIEW COMPARISON:  CT 08/31/2020.  Chest x-ray 08/31/2020. FINDINGS: Interim placement chest tube with significant drainage of previously identified right hydropneumothorax/empyema. Residual right base atelectasis/infiltrate remains. Heart size stable. Degenerative changes and scoliosis thoracic spine. IMPRESSION: Interim placement of chest tube with significant drainage of previously identified right hydropneumothorax/empyema. Residual right base atelectasis/infiltrate remains. Electronically Signed   By: Maisie Fus  Register   On: 08/31/2020 15:24   ECHOCARDIOGRAM COMPLETE  Result Date: 09/07/2020    ECHOCARDIOGRAM REPORT   Patient Name:   Nicole [name] Date of Exam: 09/07/2020 Medical Rec #:  [id number]            Height:       63.0 in Accession #:    [id number]  Weight:       97.2 lb Date of Birth:  [birth year]            BSA:          1.423 m Patient  Age:    59 years             BP:           141/106 mmHg Patient Gender: F                    HR:           77 bpm. Exam Location:  Inpatient Procedure: 2D Echo, Cardiac Doppler and Color Doppler                               MODIFIED REPORT: This report was modified by Riley Lam MD on 09/07/2020 due to Typo.  Indications:     Bacteremia  History:         Patient has prior history of Echocardiogram examinations, most                  recent 12/11/2018. COPD, Signs/Symptoms:Shortness of Breath;                  Risk Factors:Current Smoker.  Sonographer:     Ross Ludwig RDCS (AE) Referring Phys:  [id number] Tonita Phoenix VU Diagnosing Phys: Riley Lam MD IMPRESSIONS  1. Left ventricular ejection fraction, by estimation, is 55 to 60%. The left ventricle has normal function. The left ventricle has no regional wall motion abnormalities. There is severe concentric left ventricular hypertrophy. Left ventricular diastolic  parameters are consistent with Grade I diastolic dysfunction (impaired relaxation).  2. Right ventricular systolic function is normal. The right ventricular size is normal.  3. The mitral valve is grossly normal. No evidence of mitral valve regurgitation.  4. The aortic valve is tricuspid. Aortic valve regurgitation is mild. No aortic stenosis is present.  5. Aortic dilatation noted. There is borderline dilatation of the aortic root, measuring 36 mm. There is borderline dilatation of the ascending aorta, measuring 37 mm.  6. The inferior vena cava is normal in size with greater than 50% respiratory variability, suggesting right atrial pressure of 3 mmHg. Comparison(s): A prior study was performed on 12/11/2018. Difficulty comparison in the setting of image quality. In this study, mitral valve sweep suggestive increased thickness from prior study but without evidence independently moving mass. Conclusion(s)/Recommendation(s): Low suspicion of mitral valve endocarditis from imaging but cannot rule  out. TEE could be reasonable if high clinical suspicion. FINDINGS  Left Ventricle: Left ventricular ejection fraction, by estimation, is 55 to 60%. The left ventricle has normal function. The left ventricle has no regional wall motion abnormalities. The left ventricular internal cavity size was small. There is severe concentric left ventricular hypertrophy. Left ventricular diastolic parameters are consistent with Grade I diastolic dysfunction (impaired relaxation). Right Ventricle: The right ventricular size is normal. No increase in right ventricular wall thickness. Right ventricular systolic function is normal. Left Atrium: Left atrial size was normal in size. Right Atrium: Right atrial size was normal in size. Pericardium: There is no evidence of pericardial effusion. Mitral Valve: The mitral valve is grossly normal. There is moderate thickening of the mitral valve leaflet(s). No evidence of mitral valve regurgitation. Tricuspid Valve: The tricuspid valve is grossly normal. Tricuspid valve regurgitation is trivial. Aortic Valve: The aortic valve is tricuspid. Aortic  valve regurgitation is mild. Aortic regurgitation PHT measures 802 msec. No aortic stenosis is present. Aortic valve mean gradient measures 2.0 mmHg. Aortic valve peak gradient measures 3.6 mmHg. Aortic  valve area, by VTI measures 2.23 cm. Pulmonic Valve: The pulmonic valve was grossly normal. Pulmonic valve regurgitation is not visualized. No evidence of pulmonic stenosis. Aorta: Aortic dilatation noted. There is borderline dilatation of the aortic root, measuring 36 mm. There is borderline dilatation of the ascending aorta, measuring 37 mm. Venous: The inferior vena cava is normal in size with greater than 50% respiratory variability, suggesting right atrial pressure of 3 mmHg. IAS/Shunts: The interatrial septum was not well visualized.  LEFT VENTRICLE PLAX 2D LVIDd:         3.60 cm  Diastology LVIDs:         2.60 cm  LV e' medial:    6.96 cm/s  LV PW:         1.70 cm  LV E/e' medial:  6.1 LV IVS:        1.90 cm  LV e' lateral:   5.11 cm/s LVOT diam:     2.10 cm  LV E/e' lateral: 8.3 LV SV:         42 LV SV Index:   29 LVOT Area:     3.46 cm  RIGHT VENTRICLE             IVC RV Basal diam:  2.20 cm     IVC diam: 1.30 cm RV S prime:     14.30 cm/s TAPSE (M-mode): 1.5 cm LEFT ATRIUM             Index       RIGHT ATRIUM          Index LA diam:        4.10 cm 2.88 cm/m  RA Area:     9.09 cm LA Vol (A2C):   42.5 ml 29.86 ml/m RA Volume:   14.70 ml 10.33 ml/m LA Vol (A4C):   34.4 ml 24.17 ml/m LA Biplane Vol: 41.5 ml 29.16 ml/m  AORTIC VALVE AV Area (Vmax):    2.72 cm AV Area (Vmean):   2.85 cm AV Area (VTI):     2.23 cm AV Vmax:           94.80 cm/s AV Vmean:          59.600 cm/s AV VTI:            0.186 m AV Peak Grad:      3.6 mmHg AV Mean Grad:      2.0 mmHg LVOT Vmax:         74.40 cm/s LVOT Vmean:        49.000 cm/s LVOT VTI:          0.120 m LVOT/AV VTI ratio: 0.65 AI PHT:            802 msec  AORTA Ao Root diam: 4.10 cm Ao Asc diam:  3.70 cm MITRAL VALVE MV Area (PHT): 2.16 cm    SHUNTS MV Decel Time: 352 msec    Systemic VTI:  0.12 m MV E velocity: 42.20 cm/s  Systemic Diam: 2.10 cm MV A velocity: 63.40 cm/s MV E/A ratio:  0.67 Riley Lam MD Electronically signed by Riley Lam MD Signature Date/Time: 09/07/2020/5:10:57 PM    Final (Updated)    [place] IMAGE GUIDED DRAINAGE BY PERCUTANEOUS CATHETER  Result Date: 08/31/2020 INDICATION: Shortness of breath, fever, right chest pain. Hydropneumothorax  on CT suspicious for empyema. EXAM: Ultrasound-guided right chest tube placement MEDICATIONS: The patient is currently admitted to the hospital and receiving intravenous antibiotics. The antibiotics were administered within an appropriate time frame prior to the initiation of the procedure. ANESTHESIA/SEDATION: Fentanyl 100 mcg IV; Versed 2 mg IV Moderate Sedation Time:  30 minutes The patient was continuously monitored during the  procedure by the interventional radiology nurse under my direct supervision. COMPLICATIONS: None immediate. PROCEDURE: Informed written consent was obtained from the patient after a thorough discussion of the procedural risks, benefits and alternatives. All questions were addressed. Maximal Sterile Barrier Technique was utilized including caps, mask, sterile gowns, sterile gloves, sterile drape, hand hygiene and skin antiseptic. A timeout was performed prior to the initiation of the procedure. Patient placed in seated position on the procedure table. Ultrasound evaluation of the right chest demonstrated large complex effusion. Ultrasound guidance was utilized to determine the safest approach into the right empyema. The overlying skin was prepped and draped in usual fashion. Following local lidocaine administration, the right hydropneumothorax was accessed with a 19 gauge sheath needle. Purulent material was aspirated. The 19 gauge sheath was removed over 0.035 inch guidewire and a 10 [place] multipurpose pigtail drain was inserted. 20 mL sample was aspirated and sent for Gram stain and culture, as well as, cytology. Drain secured to skin with suture and connected to pleura vac set at -20 cm H2O. IMPRESSION: 10 French drain placed in right empyema utilizing ultrasound guidance. Electronically Signed   By: Acquanetta Belling M.D.   On: 08/31/2020 17:27       Subjective: Patient seen and examined at bedside.  She feels better and wants to hungry.  Denies any worsening shortness of breath, fever, nausea or vomiting.  Complains of some pain in the back.  Discharge Exam: Vitals:   09/08/20 0508 09/08/20 0925  BP: 116/72 (!) 135/96  Pulse: 90 93  Resp: 20 16  Temp: 98.2 F (36.8 C) 98.5 F (36.9 C)  SpO2: 98% 97%    General: Pt is alert, awake, not in acute distress.  Looks chronically ill.  Looks older than stated age.  Very thinly built Cardiovascular: rate controlled, S1/S2 + Respiratory: bilateral  decreased breath sounds at bases with some scattered crackles Abdominal: Soft, NT, ND, bowel sounds + Extremities: no edema, no cyanosis    The results of significant diagnostics from this hospitalization (including imaging, microbiology, ancillary and laboratory) are listed below for reference.     Microbiology: Recent Results (from the past 240 hour(s))  Resp Panel by RT-PCR (Flu A&B, Covid) Nasopharyngeal Swab     Status: None   Collection Time: 08/31/20  8:47 AM   Specimen: Nasopharyngeal Swab; Nasopharyngeal(NP) swabs in vial transport medium  Result Value Ref Range Status   SARS Coronavirus 2 by RT PCR NEGATIVE NEGATIVE Final    Comment: (NOTE) SARS-CoV-2 target nucleic acids are NOT DETECTED.  The SARS-CoV-2 RNA is generally detectable in upper respiratory specimens during the acute phase of infection. The lowest concentration of SARS-CoV-2 viral copies this assay can detect is 138 copies/mL. A negative result does not preclude SARS-Cov-2 infection and should not be used as the sole basis for treatment or other patient management decisions. A negative result may occur with  improper specimen collection/handling, submission of specimen other than nasopharyngeal swab, presence of viral mutation(s) within the areas targeted by this assay, and inadequate number of viral copies(<138 copies/mL). A negative result must be combined with clinical observations, patient history, and  epidemiological information. The expected result is Negative.  Fact Sheet for Patients:  BloggerCourse.com  Fact Sheet for Healthcare Providers:  SeriousBroker.it  This test is no t yet approved or cleared by the [place] FDA and  has been authorized for detection and/or diagnosis of SARS-CoV-2 by FDA under an Emergency Use Authorization (EUA). This EUA will remain  in effect (meaning this test can be used) for the duration of the COVID-19  declaration under Section 564(b)(1) of the Act, 21 U.S.C.section 360bbb-3(b)(1), unless the authorization is terminated  or revoked sooner.       Influenza A by PCR NEGATIVE NEGATIVE Final   Influenza B by PCR NEGATIVE NEGATIVE Final    Comment: (NOTE) The Xpert Xpress SARS-CoV-2/FLU/RSV plus assay is intended as an aid in the diagnosis of influenza from Nasopharyngeal swab specimens and should not be used as a sole basis for treatment. Nasal washings and aspirates are unacceptable for Xpert Xpress SARS-CoV-2/FLU/RSV testing.  Fact Sheet for Patients: BloggerCourse.com  Fact Sheet for Healthcare Providers: SeriousBroker.it  This test is not yet approved or cleared by the [place] FDA and has been authorized for detection and/or diagnosis of SARS-CoV-2 by FDA under an Emergency Use Authorization (EUA). This EUA will remain in effect (meaning this test can be used) for the duration of the COVID-19 declaration under Section 564(b)(1) of the Act, 21 U.S.C. section 360bbb-3(b)(1), unless the authorization is terminated or revoked.  Performed at [facility], [address] [place], [place] [address]   Blood Culture (routine x 2)     Status: None   Collection Time: 08/31/20  8:47 AM   Specimen: BLOOD  Result Value Ref Range Status   Specimen Description BLOOD LEFT ARM  Final   Special Requests   Final    BOTTLES DRAWN AEROBIC AND ANAEROBIC Blood Culture results may not be optimal due to an excessive volume of blood received in culture bottles   Culture   Final    NO GROWTH 5 DAYS Performed at [facility], [address] [place], [place] [address]    Report Status 09/05/2020 FINAL  Final  Blood Culture (routine x 2)     Status: None   Collection Time: 08/31/20  8:47 AM   Specimen: BLOOD  Result Value Ref Range Status   Specimen Description BLOOD RIGHT ARM  Final   Special Requests   Final     BOTTLES DRAWN AEROBIC AND ANAEROBIC Blood Culture results may not be optimal due to an excessive volume of blood received in culture bottles   Culture   Final    NO GROWTH 5 DAYS Performed at [facility], [address] [place], [place] [address]    Report Status 09/05/2020 FINAL  Final  Urine culture     Status: Abnormal   Collection Time: 08/31/20  8:47 AM   Specimen: In/Out Cath Urine  Result Value Ref Range Status   Specimen Description   Final    IN/OUT CATH URINE Performed at [facility], [address], [place], [place] [address]    Special Requests   Final    NONE Performed at [facility], [address] [place], [place] [address]    Culture 60,000 COLONIES/mL ESCHERICHIA COLI (A)  Final   Report Status 09/02/2020 FINAL  Final   Organism ID, Bacteria ESCHERICHIA COLI (A)  Final      Susceptibility   Escherichia coli - MIC*    AMPICILLIN 8 SENSITIVE Sensitive  CEFAZOLIN <=4 SENSITIVE Sensitive     CEFEPIME <=0.12 SENSITIVE Sensitive     CEFTRIAXONE <=0.25 SENSITIVE Sensitive     CIPROFLOXACIN <=0.25 SENSITIVE Sensitive     GENTAMICIN <=1 SENSITIVE Sensitive     IMIPENEM <=0.25 SENSITIVE Sensitive     NITROFURANTOIN <=16 SENSITIVE Sensitive     TRIMETH/SULFA <=20 SENSITIVE Sensitive     AMPICILLIN/SULBACTAM <=2 SENSITIVE Sensitive     PIP/TAZO <=4 SENSITIVE Sensitive     * 60,000 COLONIES/mL ESCHERICHIA COLI  Aerobic/Anaerobic Culture w Gram Stain (surgical/deep wound)     Status: None   Collection Time: 08/31/20  2:51 PM   Specimen: PATH Cytology Pleural fluid  Result Value Ref Range Status   Specimen Description   Final    PLEURAL Performed at [facility], [address] [place], [place] [address]    Special Requests   Final    NONE Performed at [facility], [address] [place], [place] [address]    Gram Stain   Final    ABUNDANT WBC PRESENT, PREDOMINANTLY PMN FEW GRAM POSITIVE COCCI IN PAIRS IN  CLUSTERS    Culture   Final    MODERATE METHICILLIN RESISTANT STAPHYLOCOCCUS AUREUS NO ANAEROBES ISOLATED Performed at [facility], [address], [place], [place] [address]    Report Status 09/05/2020 FINAL  Final   Organism ID, Bacteria METHICILLIN RESISTANT STAPHYLOCOCCUS AUREUS  Final      Susceptibility   Methicillin resistant staphylococcus aureus - MIC*    CIPROFLOXACIN >=8 RESISTANT Resistant     ERYTHROMYCIN >=8 RESISTANT Resistant     GENTAMICIN <=0.5 SENSITIVE Sensitive     OXACILLIN >=4 RESISTANT Resistant     TETRACYCLINE <=1 SENSITIVE Sensitive     VANCOMYCIN 1 SENSITIVE Sensitive     TRIMETH/SULFA <=10 SENSITIVE Sensitive     CLINDAMYCIN >=8 RESISTANT Resistant     RIFAMPIN <=0.5 SENSITIVE Sensitive     Inducible Clindamycin NEGATIVE Sensitive     * MODERATE METHICILLIN RESISTANT STAPHYLOCOCCUS AUREUS  Culture, blood (Routine X 2) w Reflex to ID Panel     Status: None (Preliminary result)   Collection Time: 09/05/20  4:29 PM   Specimen: BLOOD  Result Value Ref Range Status   Specimen Description BLOOD BLOOD RIGHT HAND  Final   Special Requests   Final    BOTTLES DRAWN AEROBIC AND ANAEROBIC Blood Culture adequate volume   Culture   Final    NO GROWTH 3 DAYS Performed at [facility], [address], [place], [place] [address]    Report Status PENDING  Incomplete  Culture, blood (Routine X 2) w Reflex to ID Panel     Status: None (Preliminary result)   Collection Time: 09/05/20  4:30 PM   Specimen: BLOOD  Result Value Ref Range Status   Specimen Description BLOOD BLOOD LEFT HAND  Final   Special Requests   Final    BOTTLES DRAWN AEROBIC AND ANAEROBIC Blood Culture adequate volume   Culture   Final    NO GROWTH 3 DAYS Performed at [facility], [address] [place], [place] [address]    Report Status PENDING  Incomplete  MRSA PCR Screening     Status: Abnormal   Collection Time: 09/06/20 12:47 PM   Specimen: Nasal Mucosa;  Nasopharyngeal  Result Value Ref Range Status   MRSA by PCR POSITIVE (A) NEGATIVE Final    Comment:        The GeneXpert MRSA Assay (FDA approved  for NASAL specimens only), is one component of a comprehensive MRSA colonization surveillance program. It is not intended to diagnose MRSA infection nor to guide or monitor treatment for MRSA infections. RESULT CALLED TO, READ BACK BY AND VERIFIED WITH: ERICA HOPKINS AT 1505 ON 09/06/20 BY SS Performed at [facility], [address] [place], [place] [address]      Labs: BNP (last 3 results) No results for input(s): BNP in the last 8760 hours. Basic Metabolic Panel: Recent Labs  Lab 09/04/20 0513 09/05/20 0513 09/06/20 0742 09/07/20 0238  NA 136  --  136 131*  K 4.3  --  4.4 4.1  CL 106  --  103 98  CO2 23  --  25 22  GLUCOSE 107*  --  111* 98  BUN 10  --  18 20  CREATININE 0.89 0.97 1.11* 1.24*  CALCIUM 9.3  --  9.5 9.6  MG  --   --  2.1  --   PHOS  --   --  3.7  --    Liver Function Tests: Recent Labs  Lab 09/04/20 0513  AST 42*  ALT 21  ALKPHOS 111  BILITOT 0.7  PROT 7.4  ALBUMIN 2.3*   No results for input(s): LIPASE, AMYLASE in the last 168 hours. No results for input(s): AMMONIA in the last 168 hours. CBC: Recent Labs  Lab 09/04/20 0513 09/06/20 0742 09/07/20 0238  WBC 7.0 9.7 10.0  NEUTROABS 3.6 4.9  --   HGB 11.6* 12.6 11.4*  HCT 34.1* 37.2 34.6*  MCV 94.5 95.4 98.0  PLT 419* 436* 395   Cardiac Enzymes: No results for input(s): CKTOTAL, CKMB, CKMBINDEX, TROPONINI in the last 168 hours. BNP: Invalid input(s): POCBNP CBG: No results for input(s): GLUCAP in the last 168 hours. D-Dimer No results for input(s): DDIMER in the last 72 hours. Hgb A1c No results for input(s): HGBA1C in the last 72 hours. Lipid Profile No results for input(s): CHOL, HDL, LDLCALC, TRIG, CHOLHDL, LDLDIRECT in the last 72 hours. Thyroid function studies No results for input(s): TSH, T4TOTAL, T3FREE,  THYROIDAB in the last 72 hours.  Invalid input(s): FREET3 Anemia work up No results for input(s): VITAMINB12, FOLATE, FERRITIN, TIBC, IRON, RETICCTPCT in the last 72 hours. Urinalysis    Component Value Date/Time   COLORURINE YELLOW (A) 08/31/2020 0847   APPEARANCEUR HAZY (A) 08/31/2020 0847   LABSPEC 1.021 08/31/2020 0847   PHURINE 6.0 08/31/2020 0847   GLUCOSEU NEGATIVE 08/31/2020 0847   HGBUR NEGATIVE 08/31/2020 0847   BILIRUBINUR NEGATIVE 08/31/2020 0847   KETONESUR NEGATIVE 08/31/2020 0847   PROTEINUR NEGATIVE 08/31/2020 0847   NITRITE POSITIVE (A) 08/31/2020 0847   LEUKOCYTESUR MODERATE (A) 08/31/2020 0847   Sepsis Labs Invalid input(s): PROCALCITONIN,  WBC,  LACTICIDVEN Microbiology Recent Results (from the past 240 hour(s))  Resp Panel by RT-PCR (Flu A&B, Covid) Nasopharyngeal Swab     Status: None   Collection Time: 08/31/20  8:47 AM   Specimen: Nasopharyngeal Swab; Nasopharyngeal(NP) swabs in vial transport medium  Result Value Ref Range Status   SARS Coronavirus 2 by RT PCR NEGATIVE NEGATIVE Final    Comment: (NOTE) SARS-CoV-2 target nucleic acids are NOT DETECTED.  The SARS-CoV-2 RNA is generally detectable in upper respiratory specimens during the acute phase of infection. The lowest concentration of SARS-CoV-2 viral copies this assay can detect is 138 copies/mL. A negative result does not preclude SARS-Cov-2 infection and should not be used as the sole basis for treatment or other patient  management decisions. A negative result may occur with  improper specimen collection/handling, submission of specimen other than nasopharyngeal swab, presence of viral mutation(s) within the areas targeted by this assay, and inadequate number of viral copies(<138 copies/mL). A negative result must be combined with clinical observations, patient history, and epidemiological information. The expected result is Negative.  Fact Sheet for Patients:   BloggerCourse.com  Fact Sheet for Healthcare Providers:  SeriousBroker.it  This test is no t yet approved or cleared by the [place] FDA and  has been authorized for detection and/or diagnosis of SARS-CoV-2 by FDA under an Emergency Use Authorization (EUA). This EUA will remain  in effect (meaning this test can be used) for the duration of the COVID-19 declaration under Section 564(b)(1) of the Act, 21 U.S.C.section 360bbb-3(b)(1), unless the authorization is terminated  or revoked sooner.       Influenza A by PCR NEGATIVE NEGATIVE Final   Influenza B by PCR NEGATIVE NEGATIVE Final    Comment: (NOTE) The Xpert Xpress SARS-CoV-2/FLU/RSV plus assay is intended as an aid in the diagnosis of influenza from Nasopharyngeal swab specimens and should not be used as a sole basis for treatment. Nasal washings and aspirates are unacceptable for Xpert Xpress SARS-CoV-2/FLU/RSV testing.  Fact Sheet for Patients: BloggerCourse.com  Fact Sheet for Healthcare Providers: SeriousBroker.it  This test is not yet approved or cleared by the [place] FDA and has been authorized for detection and/or diagnosis of SARS-CoV-2 by FDA under an Emergency Use Authorization (EUA). This EUA will remain in effect (meaning this test can be used) for the duration of the COVID-19 declaration under Section 564(b)(1) of the Act, 21 U.S.C. section 360bbb-3(b)(1), unless the authorization is terminated or revoked.  Performed at [facility], [address] [place], [place] [address]   Blood Culture (routine x 2)     Status: None   Collection Time: 08/31/20  8:47 AM   Specimen: BLOOD  Result Value Ref Range Status   Specimen Description BLOOD LEFT ARM  Final   Special Requests   Final    BOTTLES DRAWN AEROBIC AND ANAEROBIC Blood Culture results may not be optimal due to an excessive volume of  blood received in culture bottles   Culture   Final    NO GROWTH 5 DAYS Performed at [facility], [address] [place], [place] [address]    Report Status 09/05/2020 FINAL  Final  Blood Culture (routine x 2)     Status: None   Collection Time: 08/31/20  8:47 AM   Specimen: BLOOD  Result Value Ref Range Status   Specimen Description BLOOD RIGHT ARM  Final   Special Requests   Final    BOTTLES DRAWN AEROBIC AND ANAEROBIC Blood Culture results may not be optimal due to an excessive volume of blood received in culture bottles   Culture   Final    NO GROWTH 5 DAYS Performed at [facility], [address] [place], [place] [address]    Report Status 09/05/2020 FINAL  Final  Urine culture     Status: Abnormal   Collection Time: 08/31/20  8:47 AM   Specimen: In/Out Cath Urine  Result Value Ref Range Status   Specimen Description   Final    IN/OUT CATH URINE Performed at [facility], [address], [place], [place] [address]    Special Requests   Final    NONE Performed at [facility], [address], [place], [place] [address]  Culture 60,000 COLONIES/mL ESCHERICHIA COLI (A)  Final   Report Status 09/02/2020 FINAL  Final   Organism ID, Bacteria ESCHERICHIA COLI (A)  Final      Susceptibility   Escherichia coli - MIC*    AMPICILLIN 8 SENSITIVE Sensitive     CEFAZOLIN <=4 SENSITIVE Sensitive     CEFEPIME <=0.12 SENSITIVE Sensitive     CEFTRIAXONE <=0.25 SENSITIVE Sensitive     CIPROFLOXACIN <=0.25 SENSITIVE Sensitive     GENTAMICIN <=1 SENSITIVE Sensitive     IMIPENEM <=0.25 SENSITIVE Sensitive     NITROFURANTOIN <=16 SENSITIVE Sensitive     TRIMETH/SULFA <=20 SENSITIVE Sensitive     AMPICILLIN/SULBACTAM <=2 SENSITIVE Sensitive     PIP/TAZO <=4 SENSITIVE Sensitive     * 60,000 COLONIES/mL ESCHERICHIA COLI  Aerobic/Anaerobic Culture w Gram Stain (surgical/deep wound)     Status: None   Collection Time: 08/31/20  2:51 PM    Specimen: PATH Cytology Pleural fluid  Result Value Ref Range Status   Specimen Description   Final    PLEURAL Performed at [facility], [address] [place], [place] [address]    Special Requests   Final    NONE Performed at [facility], [address] [place], [place] [address]    Gram Stain   Final    ABUNDANT WBC PRESENT, PREDOMINANTLY PMN FEW GRAM POSITIVE COCCI IN PAIRS IN CLUSTERS    Culture   Final    MODERATE METHICILLIN RESISTANT STAPHYLOCOCCUS AUREUS NO ANAEROBES ISOLATED Performed at [facility], [address] [place], [place] [address]    Report Status 09/05/2020 FINAL  Final   Organism ID, Bacteria METHICILLIN RESISTANT STAPHYLOCOCCUS AUREUS  Final      Susceptibility   Methicillin resistant staphylococcus aureus - MIC*    CIPROFLOXACIN >=8 RESISTANT Resistant     ERYTHROMYCIN >=8 RESISTANT Resistant     GENTAMICIN <=0.5 SENSITIVE Sensitive     OXACILLIN >=4 RESISTANT Resistant     TETRACYCLINE <=1 SENSITIVE Sensitive     VANCOMYCIN 1 SENSITIVE Sensitive     TRIMETH/SULFA <=10 SENSITIVE Sensitive     CLINDAMYCIN >=8 RESISTANT Resistant     RIFAMPIN <=0.5 SENSITIVE Sensitive     Inducible Clindamycin NEGATIVE Sensitive     * MODERATE METHICILLIN RESISTANT STAPHYLOCOCCUS AUREUS  Culture, blood (Routine X 2) w Reflex to ID Panel     Status: None (Preliminary result)   Collection Time: 09/05/20  4:29 PM   Specimen: BLOOD  Result Value Ref Range Status   Specimen Description BLOOD BLOOD RIGHT HAND  Final   Special Requests   Final    BOTTLES DRAWN AEROBIC AND ANAEROBIC Blood Culture adequate volume   Culture   Final    NO GROWTH 3 DAYS Performed at [facility], [address], [place], [place] [address]    Report Status PENDING  Incomplete  Culture, blood (Routine X 2) w Reflex to ID Panel     Status: None (Preliminary result)   Collection Time: 09/05/20  4:30 PM   Specimen: BLOOD  Result Value Ref Range Status    Specimen Description BLOOD BLOOD LEFT HAND  Final   Special Requests   Final    BOTTLES DRAWN AEROBIC AND ANAEROBIC Blood Culture adequate volume   Culture   Final    NO GROWTH 3 DAYS Performed at [facility], [address] [place], [place] [address]    Report Status PENDING  Incomplete  MRSA PCR Screening  Status: Abnormal   Collection Time: 09/06/20 12:47 PM   Specimen: Nasal Mucosa; Nasopharyngeal  Result Value Ref Range Status   MRSA by PCR POSITIVE (A) NEGATIVE Final    Comment:        The GeneXpert MRSA Assay (FDA approved for NASAL specimens only), is one component of a comprehensive MRSA colonization surveillance program. It is not intended to diagnose MRSA infection nor to guide or monitor treatment for MRSA infections. RESULT CALLED TO, READ BACK BY AND VERIFIED WITH: ERICA HOPKINS AT 1505 ON 09/06/20 BY SS Performed at [facility], [address] [place], [place] [id number]      Time coordinating discharge: 35 minutes  SIGNED:   Glade LloydKshitiz Elio Haden, MD  [facility] 09/08/2020, 9:57 AM

## 2020-09-08 NOTE — Progress Notes (Signed)
Patient discharged in stable condition via car with husband and family. AVS documentation given and explained. All questions and concerns were addressed.

## 2020-09-09 ENCOUNTER — Ambulatory Visit: Payer: Self-pay | Admitting: *Deleted

## 2020-09-09 NOTE — Telephone Encounter (Signed)
I returned pt's call.   She had a chest tube placed for an infection in her lung.   It was removed yesterday but they didn't tell her when she could remove the drsing or take a shower. She's wanting to know if she can remove the drsg and take a shower.   "I know there is a hole there".   "There are no stitches".   "My discharge instructions tell me everything except when to take the drsg off and when I can take a shower.  Her next appt with a dr isn't until 09/15/2020 with Infectious Disease.   She does not have a PCP.     She mentioned she was going to call [facility] and see if she can see a dr there for her care.    I advised her to go a head and call the [facility] and let them know what is going on and get an appt with one of their drs who can examine the wound and further advise you. She was agreeable to this and will call.   She verbalized understanding not to remove the drsg for at least the next 3 days in case it is an occlusive drsg which she did not know if it was or not.    Instructed her to not get it wet.  She was agreeable to this plan and to calling for an appt.    Reason for Disposition . [1] Caller has NON-URGENT question AND [2] triager unable to answer question  Answer Assessment - Initial Assessment Questions 1. SYMPTOM: "What's the main symptom you're concerned about?" (e.g., redness, pain, drainage)     In hospital 8 days.  I had a chest tube in right lung.   Yesterday I had the chest tube removed.   I don't know what to do with the dressing.   Can I remove it and take a shower? 2. ONSET: "When did chest tube remove     Removed yesterday 3. SURGERY: "What surgery was performed?"     Put in 08/31/2020 4. DATE of SURGERY: "When was surgery performed?"      Chest tube removed yesterday 5. INCISION SITE: "Where is the incision located?"      There is a hole there.   No stitches    I don't know what they put over it when it was removed.    6. REDNESS: "Is there any redness at the incision site?" If yes, ask: "How wide across is the redness?" (Inches, centimeters)      There is a hole there now from the tube.  No stitches. 7. PAIN: "Is there any pain?" If Yes, ask: "How bad is it?"  (Scale 1-10; or mild, moderate, severe)     Sore 8. BLEEDING: "Is there any bleeding?" If Yes, ask: "How much?" and "Where?"     The bandage is dry  9. DRAINAGE: "Is there any drainage from the incision site?" If yes, ask: "What color and how much?" (e.g., red, cloudy, pus; drops, teaspoon)     No drainage.  Dressing intact and dry.    I have a appt on 09/15/2020 with infection disease dr.     Nikki Dom. FEVER: "Do you have a fever?" If Yes, ask: "What is your temperature, how was it measured, and when did it start?"       No 11. OTHER SYMPTOMS: "Do you have any other symptoms?" (e.g., shaking chills, weakness, rash elsewhere on body)  No symptoms  Protocols used: WOUND - CHRONIC AND NEGATIVE PRESSURE WOUND THERAPY-A-AH, POST-OP INCISION SYMPTOMS AND QUESTIONS-A-AH

## 2020-09-10 LAB — CULTURE, BLOOD (ROUTINE X 2)
Culture: NO GROWTH
Culture: NO GROWTH
Special Requests: ADEQUATE
Special Requests: ADEQUATE

## 2020-09-15 ENCOUNTER — Inpatient Hospital Stay: Payer: Medicaid Other | Admitting: Infectious Diseases

## 2020-09-15 ENCOUNTER — Telehealth: Payer: Self-pay | Admitting: Infectious Diseases

## 2020-09-15 NOTE — Telephone Encounter (Signed)
Pt' called to cancel  appt for today and would like to resch.

## 2021-08-12 ENCOUNTER — Emergency Department: Payer: Medicaid Other

## 2021-08-12 ENCOUNTER — Encounter: Payer: Self-pay | Admitting: Radiology

## 2021-08-12 ENCOUNTER — Emergency Department
Admission: EM | Admit: 2021-08-12 | Discharge: 2021-08-13 | Disposition: A | Discharge status: Home / Self Care | Payer: Medicaid Other | Attending: Emergency Medicine | Admitting: Emergency Medicine

## 2021-08-12 DIAGNOSIS — Z5321 Procedure and treatment not carried out due to patient leaving prior to being seen by health care provider: Secondary | ICD-10-CM | POA: Insufficient documentation

## 2021-08-12 DIAGNOSIS — I10 Essential (primary) hypertension: Secondary | ICD-10-CM | POA: Insufficient documentation

## 2021-08-12 DIAGNOSIS — J449 Chronic obstructive pulmonary disease, unspecified: Secondary | ICD-10-CM | POA: Insufficient documentation

## 2021-08-12 DIAGNOSIS — R2689 Other abnormalities of gait and mobility: Secondary | ICD-10-CM | POA: Insufficient documentation

## 2021-08-12 DIAGNOSIS — R0789 Other chest pain: Secondary | ICD-10-CM | POA: Insufficient documentation

## 2021-08-12 DIAGNOSIS — R42 Dizziness and giddiness: Secondary | ICD-10-CM | POA: Insufficient documentation

## 2021-08-12 DIAGNOSIS — R110 Nausea: Secondary | ICD-10-CM | POA: Insufficient documentation

## 2021-08-12 DIAGNOSIS — M79602 Pain in left arm: Secondary | ICD-10-CM | POA: Insufficient documentation

## 2021-08-12 LAB — CBC
HCT: 38.6 % (ref 36.0–46.0)
Hemoglobin: 12.5 g/dL (ref 12.0–15.0)
MCH: 30.7 pg (ref 26.0–34.0)
MCHC: 32.4 g/dL (ref 30.0–36.0)
MCV: 94.8 fL (ref 80.0–100.0)
Platelets: 239 10*3/uL (ref 150–400)
RBC: 4.07 MIL/uL (ref 3.87–5.11)
RDW: 15.5 % (ref 11.5–15.5)
WBC: 6.9 10*3/uL (ref 4.0–10.5)
nRBC: 0 % (ref 0.0–0.2)

## 2021-08-12 LAB — BASIC METABOLIC PANEL
Anion gap: 8 (ref 5–15)
BUN: 19 mg/dL (ref 6–20)
CO2: 26 mmol/L (ref 22–32)
Calcium: 8.8 mg/dL — ABNORMAL LOW (ref 8.9–10.3)
Chloride: 104 mmol/L (ref 98–111)
Creatinine, Ser: 1.32 mg/dL — ABNORMAL HIGH (ref 0.44–1.00)
GFR, Estimated: 47 mL/min — ABNORMAL LOW (ref >60)
Glucose, Bld: 99 mg/dL (ref 70–99)
Potassium: 3.3 mmol/L — ABNORMAL LOW (ref 3.5–5.1)
Sodium: 138 mmol/L (ref 135–145)

## 2021-08-12 LAB — TROPONIN I (HIGH SENSITIVITY): Troponin I (High Sensitivity): 19 ng/L — ABNORMAL HIGH (ref <18)

## 2021-08-12 NOTE — ED Notes (Signed)
Pt not found to be in lobby when rounding.  Will continue to check.

## 2021-08-12 NOTE — ED Triage Notes (Signed)
First nurse note:  pt to ED via EMS from home c/o CP left chest radiating left arm, being treated for nerve pain to left arm x3 months.  Hx of bipolar, COPD, HTN and non compliant with meds.  EMS vitals 176/98 BP, 100 HR, 100 temp, 97% RA , 120 CBG.

## 2021-08-12 NOTE — ED Triage Notes (Signed)
Pt reports chest pain that started today. Pt states she cant hear out of her left ear for the past three months and reports tingling in her left hand for the last 3 months.

## 2021-08-14 ENCOUNTER — Telehealth: Payer: Self-pay | Admitting: Emergency Medicine

## 2021-08-14 NOTE — Telephone Encounter (Addendum)
Called patient due to left emergency department before provider exam to inquire about condition and follow up plans. Person answered, but was cut off after I identified myself.  She did call me back.  She says she does not have insurance except for family planning.  She is no longer a patient at [facility], and they told her they wree not taking new patients now.  I explained the other [facility].  Patient is still concerned about cost.  I explained that I could not give her the cost, but she could call them and ask.  I also told [facility].  I told her that she needs to see a doctor somewhere so she can find out if this is her heart.  I told her she can always return here.  I told her that there is often wait to be seen here,  but that she should plan on waiting until she is seen by a doctor if she chooses to return.

## 2021-10-03 ENCOUNTER — Other Ambulatory Visit: Payer: Self-pay

## 2021-10-03 ENCOUNTER — Ambulatory Visit: Payer: Self-pay | Admitting: Gerontology

## 2021-10-03 ENCOUNTER — Encounter: Payer: Self-pay | Admitting: Gerontology

## 2021-10-03 ENCOUNTER — Other Ambulatory Visit: Payer: Self-pay | Admitting: Gerontology

## 2021-10-03 VITALS — BP 218/119 | HR 93 | Temp 98.0°F | Resp 17 | Ht 63.0 in | Wt 112.1 lb

## 2021-10-03 DIAGNOSIS — H938X2 Other specified disorders of left ear: Secondary | ICD-10-CM | POA: Insufficient documentation

## 2021-10-03 DIAGNOSIS — Z7689 Persons encountering health services in other specified circumstances: Secondary | ICD-10-CM | POA: Insufficient documentation

## 2021-10-03 DIAGNOSIS — F17200 Nicotine dependence, unspecified, uncomplicated: Secondary | ICD-10-CM | POA: Insufficient documentation

## 2021-10-03 DIAGNOSIS — Z8709 Personal history of other diseases of the respiratory system: Secondary | ICD-10-CM

## 2021-10-03 DIAGNOSIS — IMO0001 Reserved for inherently not codable concepts without codable children: Secondary | ICD-10-CM | POA: Insufficient documentation

## 2021-10-03 DIAGNOSIS — R600 Localized edema: Secondary | ICD-10-CM | POA: Insufficient documentation

## 2021-10-03 DIAGNOSIS — M25471 Effusion, right ankle: Secondary | ICD-10-CM | POA: Insufficient documentation

## 2021-10-03 DIAGNOSIS — Z8679 Personal history of other diseases of the circulatory system: Secondary | ICD-10-CM | POA: Insufficient documentation

## 2021-10-03 DIAGNOSIS — Z8659 Personal history of other mental and behavioral disorders: Secondary | ICD-10-CM | POA: Insufficient documentation

## 2021-10-03 MED ORDER — BLOOD PRESSURE KIT
1.0000 | PACK | Freq: Two times a day (BID) | 0 refills | Status: DC
  Filled 2021-10-03: qty 1, fill #0

## 2021-10-03 MED ORDER — LISINOPRIL 20 MG PO TABS
20.0000 mg | ORAL_TABLET | Freq: Every day | ORAL | 0 refills | Status: DC
  Filled 2021-10-03: qty 30, 30d supply, fill #0

## 2021-10-03 MED ORDER — SALINE SPRAY 0.65 % NA SOLN
1.0000 | NASAL | 0 refills | Status: DC | PRN
  Filled 2021-10-03: qty 30, fill #0

## 2021-10-03 MED ORDER — FLUTICASONE-SALMETEROL 100-50 MCG/ACT IN AEPB
1.0000 | INHALATION_SPRAY | Freq: Two times a day (BID) | RESPIRATORY_TRACT | 3 refills | Status: DC
  Filled 2021-10-03: qty 60, 30d supply, fill #0

## 2021-10-03 MED ORDER — FLUTICASONE-SALMETEROL 250-50 MCG/ACT IN AEPB
1.0000 | INHALATION_SPRAY | Freq: Two times a day (BID) | RESPIRATORY_TRACT | 5 refills | Status: DC
  Filled 2021-10-03: qty 60, 30d supply, fill #0

## 2021-10-03 MED ORDER — HYDRALAZINE HCL 25 MG PO TABS
25.0000 mg | ORAL_TABLET | Freq: Three times a day (TID) | ORAL | 0 refills | Status: DC
  Filled 2021-10-03: qty 90, 30d supply, fill #0

## 2021-10-03 MED ORDER — ALBUTEROL SULFATE HFA 108 (90 BASE) MCG/ACT IN AERS
1.0000 | INHALATION_SPRAY | Freq: Four times a day (QID) | RESPIRATORY_TRACT | 5 refills | Status: DC | PRN
  Filled 2021-10-03 – 2022-01-30 (×2): qty 6.7, 25d supply, fill #0
  Filled 2022-03-30: qty 6.7, 25d supply, fill #1

## 2021-10-03 NOTE — Progress Notes (Signed)
? ?New Patient Office Visit ? ?Subjective:  ?Patient ID: Nicole [name], female    DOB: [birth year]  Age: 61 y.o. MRN: [id number] ? ?CC:  ?Chief Complaint  ?Patient presents with  ? Establish Care  ? COPD  ? Hypertension  ? ? ?HPI ?Nicole [name]  is a 60y/o female who has history of bipolar, COPD, hypertension, asthma, anxiety, collagen vascular disease, hepatitis C presents to establish care and evaluation of her chronic conditions. She reports having a history of hypertension and has not taking any medication  for 1 year. She states that she takes her husbands antihypertensive medication ones in a while. She denies headache, chest pain, palpitation, vision changes and light headedness. She also reports edema to bilateral ankle that has being going on for 1 month.  ?She reports that her left ear is stuffed up for 4 months, reports experiencing tinnitus but denies otalgia and discharge. She also reports history of Asthma and COPD, uses her husbands inhaler. She smokes 1 pack of cigarette  daily and admits the desire to quit. She states that her breathing is stable, denies chest tightness with occasional shortness of breath with exertion. She also reports history of Bipolar, depression and anxiety. She states that her mood is good, denies suicidal nor homicidal ideation and requests mental health referral. She also states that she has a history of positive Hepatitis C and has not been treated. Overall, she states that she's doing well, but concerned about her uncontrolled hypertension. ? ?Past Medical History:  ?Diagnosis Date  ? Anxiety   ? Asthma   ? Collagen vascular disease ([place])   ? COPD (chronic obstructive pulmonary disease) ([place])   ? Hepatitis C   ? Hypertension   ? ? ?Past Surgical History:  ?Procedure Laterality Date  ? ABDOMINAL HYSTERECTOMY    ? ? ?Family History  ?Problem Relation Age of Onset  ? Depression Mother   ? Hypertension Mother   ? Heart attack Mother   ? Insomnia Mother   ? Thyroid  disease Mother   ? Bipolar disorder Mother   ? Parkinson's disease Father   ? Arthritis Maternal Grandmother   ? Bipolar disorder Maternal Grandmother   ? Depression Maternal Grandmother   ? Other Maternal Grandfather   ?     unknown medical history  ? Heart attack Paternal Grandmother   ? Ovarian cancer Paternal Grandmother   ? Hyperlipidemia Paternal Grandfather   ? Hypertension Paternal Grandfather   ? Congestive Heart Failure Paternal Grandfather   ? Rheum arthritis Other   ? Osteoporosis Other   ? Clotting disorder Other   ? Stroke Other   ? ? ?Social History  ? ?Socioeconomic History  ? Marital status: Married  ?  Spouse name: Not on file  ? Number of children: Not on file  ? Years of education: Not on file  ? Highest education level: Not on file  ?Occupational History  ? Not on file  ?Tobacco Use  ? Smoking status: Every Day  ?  Packs/day: 1.00  ?  Years: 33.00  ?  Pack years: 33.00  ?  Types: Cigarettes  ? Smokeless tobacco: Never  ?Vaping Use  ? Vaping Use: Never used  ?Substance and Sexual Activity  ? Alcohol use: Yes  ?  Alcohol/week: 4.0 standard drinks  ?  Types: 4 Standard drinks or equivalent per week  ?  Comment: mixed drinks 2 drinks twice a week  ? Drug use: Not Currently  ?  Types:  Cocaine, Marijuana  ?  Comment: last use ~04/2021  ? Sexual activity: Not on file  ?Other Topics Concern  ? Not on file  ?Social History Narrative  ? Not on file  ? ?Social Determinants of Health  ? ?Financial Resource Strain: Not on file  ?Food Insecurity: No Food Insecurity  ? Worried About Charity fundraiser in the Last Year: Never true  ? Ran Out of Food in the Last Year: Never true  ?Transportation Needs: No Transportation Needs  ? Lack of Transportation (Medical): No  ? Lack of Transportation (Non-Medical): No  ?Physical Activity: Not on file  ?Stress: Not on file  ?Social Connections: Not on file  ?Intimate Partner Violence: Not on file  ? ? ?ROS ?Review of Systems  ?Constitutional: Negative.   ?HENT:  Positive  for ear pain. Negative for ear discharge, postnasal drip, rhinorrhea, sinus pressure and sinus pain.   ?Eyes: Negative.   ?Respiratory:  Positive for shortness of breath.   ?Cardiovascular:  Positive for leg swelling (edema to bilateral ankle).  ?Gastrointestinal: Negative.   ?Endocrine: Negative.   ?Genitourinary: Negative.   ?Musculoskeletal: Negative.   ?Skin: Negative.   ?Neurological: Negative.   ?Psychiatric/Behavioral:  The patient is nervous/anxious.   ? ?Objective:  ? ?Today's Vitals: BP (!) 218/119 (BP Location: Right Arm, Patient Position: Sitting, Cuff Size: Normal)   Pulse 93   Temp 98 ?F (36.7 ?C) (Oral)   Resp 17   Ht 5' 3" (1.6 m)   Wt 112 lb 1.6 oz (50.8 kg)   SpO2 95%   BMI 19.86 kg/m?  ? ?Physical Exam ?HENT:  ?   Head: Normocephalic and atraumatic.  ?   Nose: Nose normal.  ?   Mouth/Throat:  ?   Mouth: Mucous membranes are moist.  ?Eyes:  ?   Extraocular Movements: Extraocular movements intact.  ?   Conjunctiva/sclera: Conjunctivae normal.  ?   Pupils: Pupils are equal, round, and reactive to light.  ?Cardiovascular:  ?   Rate and Rhythm: Normal rate and regular rhythm.  ?   Pulses: Normal pulses.  ?   Heart sounds: Normal heart sounds.  ?Pulmonary:  ?   Effort: Pulmonary effort is normal.  ?   Breath sounds: Normal breath sounds.  ?Abdominal:  ?   General: Abdomen is flat. Bowel sounds are normal.  ?   Palpations: Abdomen is soft.  ?Genitourinary: ?   Comments: Deferred per patient ?Musculoskeletal:     ?   General: Swelling: + 1 edema to bilateral ankle.  ?   Cervical back: Normal range of motion.  ?Skin: ?   General: Skin is warm.  ?Neurological:  ?   General: No focal deficit present.  ?   Mental Status: She is alert and oriented to person, place, and time.  ?Psychiatric:     ?   Mood and Affect: Mood normal.     ?   Behavior: Behavior normal.     ?   Thought Content: Thought content normal.     ?   Judgment: Judgment normal.  ? ? ?Assessment & Plan:  ? ? ? ? ?1. Encounter to establish  care ?-Routine labs will be checked. ?- Comp Met (CMET); Future ?- Lipid panel; Future ?- Rheumatoid Factor; Future ?- Hepatitis C Antibody; Future ?- Hepatitis C Antibody ?- Rheumatoid Factor ?- Lipid panel ?- Comp Met (CMET) ? ?2. History of hypertension ?-Her blood pressure was uncontrolled, She declined going to the ED. She was started  on lisinopril 20 mg and hydralazine 25 mg 3 times daily.  She was educated on medication side effects and advised to notify clinic or go to the emergency room. She was educated on signs and symptoms of stroke and advised to go to the ED. ?- EKG 12-Lead-her EKG was done during visit, and it was borderline and possible LVH though sinus rhythm. She will follow up with Cardiologist. ?- hydrALAZINE (APRESOLINE) 25 MG tablet; Take 1 tablet (25 mg total) by mouth 3 (three) times daily.  Dispense: 90 tablet; Refill: 0 ?- lisinopril (ZESTRIL) 20 MG tablet; Take 1 tablet (20 mg total) by mouth once daily.  Dispense: 30 tablet; Refill: 0 ?- Blood Pressure KIT; USE AS DIRECTED TWICE DAILY.  Dispense: 1 kit; Refill: 0 ?- Ambulatory referral to Cardiology ? ?3. History of bipolar disorder ?- She will follow up with [facility] behavioral health and advised to call Crisis help line with worsening symptoms. ? ?4. History of COPD ?- Her breathing is stable, was started on Albuterol and Wixela, was educated on medication side effects and advised to notify clinic. ?- albuterol (PROVENTIL HFA) 108 (90 Base) MCG/ACT inhaler; Inhale 1-2 puffs into the lungs once every 6 (six) hours as needed for wheezing or shortness of breath.  Dispense: 6.7 g; Refill: 5 ? ?5. Smoking ?- She was encouraged on smoking cessation, provided with New[facility] Quitline and advised to call. ? ?6. Ear stuffiness, left ?- Her ear canal was normal, landmark visualized, was started on Saline nasal spray. ?- sodium chloride (OCEAN) 0.65 % SOLN nasal spray; Place 1 spray into both nostrils daily as needed for congestion.  Dispense: 30 mL; Refill:  0 ? ?7. Ankle edema, bilateral ?- She was advised to elevate leg while sitting down and wear compression stockings, she was advised to go to the ED for worsening symptoms. ? ? ? ?Follow-up: Return in about 1

## 2021-10-03 NOTE — Patient Instructions (Signed)

## 2021-10-04 LAB — COMPREHENSIVE METABOLIC PANEL
ALT: 73 IU/L — ABNORMAL HIGH (ref 0–32)
AST: 81 IU/L — ABNORMAL HIGH (ref 0–40)
Albumin/Globulin Ratio: 1 — ABNORMAL LOW (ref 1.2–2.2)
Albumin: 3.6 g/dL — ABNORMAL LOW (ref 3.8–4.9)
Alkaline Phosphatase: 108 IU/L (ref 44–121)
BUN/Creatinine Ratio: 16 (ref 9–23)
BUN: 17 mg/dL (ref 6–24)
Bilirubin Total: <0.2 mg/dL (ref 0.0–1.2)
CO2: 19 mmol/L — ABNORMAL LOW (ref 20–29)
Calcium: 9.6 mg/dL (ref 8.7–10.2)
Chloride: 107 mmol/L — ABNORMAL HIGH (ref 96–106)
Creatinine, Ser: 1.04 mg/dL — ABNORMAL HIGH (ref 0.57–1.00)
Globulin, Total: 3.7 g/dL (ref 1.5–4.5)
Glucose: 90 mg/dL (ref 70–99)
Potassium: 4.2 mmol/L (ref 3.5–5.2)
Sodium: 141 mmol/L (ref 134–144)
Total Protein: 7.3 g/dL (ref 6.0–8.5)
eGFR: 62 mL/min/{1.73_m2} (ref >59)

## 2021-10-04 LAB — LIPID PANEL
Chol/HDL Ratio: 2 ratio (ref 0.0–4.4)
Cholesterol, Total: 151 mg/dL (ref 100–199)
HDL: 77 mg/dL (ref >39)
LDL Chol Calc (NIH): 57 mg/dL (ref 0–99)
Triglycerides: 90 mg/dL (ref 0–149)
VLDL Cholesterol Cal: 17 mg/dL (ref 5–40)

## 2021-10-04 LAB — HEPATITIS C ANTIBODY: Hep C Virus Ab: REACTIVE — AB

## 2021-10-04 LAB — RHEUMATOID FACTOR: Rheumatoid fact SerPl-aCnc: 38.6 IU/mL — ABNORMAL HIGH (ref <14.0)

## 2021-10-05 ENCOUNTER — Other Ambulatory Visit: Payer: Self-pay | Admitting: Gerontology

## 2021-10-05 ENCOUNTER — Ambulatory Visit: Payer: Medicaid Other | Admitting: Gerontology

## 2021-10-05 DIAGNOSIS — Z8619 Personal history of other infectious and parasitic diseases: Secondary | ICD-10-CM

## 2021-10-10 ENCOUNTER — Ambulatory Visit: Payer: Self-pay | Admitting: Licensed Clinical Social Worker

## 2021-10-10 ENCOUNTER — Institutional Professional Consult (permissible substitution): Payer: Medicaid Other | Admitting: Licensed Clinical Social Worker

## 2021-10-10 ENCOUNTER — Ambulatory Visit: Payer: Medicaid Other | Admitting: Gerontology

## 2021-10-10 ENCOUNTER — Other Ambulatory Visit: Payer: Self-pay

## 2021-10-10 DIAGNOSIS — Z8679 Personal history of other diseases of the circulatory system: Secondary | ICD-10-CM

## 2021-10-10 DIAGNOSIS — G8929 Other chronic pain: Secondary | ICD-10-CM | POA: Insufficient documentation

## 2021-10-10 DIAGNOSIS — F3160 Bipolar disorder, current episode mixed, unspecified: Secondary | ICD-10-CM

## 2021-10-10 MED ORDER — LISINOPRIL 20 MG PO TABS
20.0000 mg | ORAL_TABLET | Freq: Every day | ORAL | 0 refills | Status: DC
  Filled 2021-10-10: qty 30, 30d supply, fill #0

## 2021-10-10 MED ORDER — NAPROXEN 250 MG PO TABS
250.0000 mg | ORAL_TABLET | Freq: Two times a day (BID) | ORAL | 0 refills | Status: DC
  Filled 2021-10-10: qty 60, 30d supply, fill #0

## 2021-10-10 MED ORDER — NAPROXEN 500 MG PO TABS
500.0000 mg | ORAL_TABLET | Freq: Every day | ORAL | 1 refills | Status: DC
  Filled 2021-10-10: qty 30, 30d supply, fill #0

## 2021-10-10 MED ORDER — HYDRALAZINE HCL 25 MG PO TABS
25.0000 mg | ORAL_TABLET | Freq: Three times a day (TID) | ORAL | 0 refills | Status: DC
  Filled 2021-10-10: qty 90, 30d supply, fill #0
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
## 2021-10-10 NOTE — Progress Notes (Signed)
? ?Established Patient Office Visit ? ?Subjective:  ?Patient ID: Nicole [name], female    DOB: 11-Jan-1961  Age: 61 y.o. MRN: [id number] ? ?CC: No chief complaint on file. ? ? ?HPI ?Nicole [name] is a 61y/o female who has history of bipolar, COPD, hypertension, asthma, anxiety, collagen vascular disease, hepatitis C presents for routine follow up. She has being checking her blood pressure twice daily, lowest 139/88 this morning, but it has being in the low 190's/low 100's. She also c/o edema to bilateral ankles that radiates to her lower ankle, but has resolved. She states that she is modifying her diet, states that she craves salt, and has cut back on eating pickles, drinks more water and has ordered nicotine patches. She denies chest pain, palpitation, dizziness and vision changes, will follow up with Cardiologist on 11/06/21.  She also reports experiencing pain to right leg and hip which is chronic and her Rheumatoid factor was 38.6 IU/ml. She states that pain started in 2010, and pain is constant, denies any aggravating factor, states that taking Naproxen relieves symtoms. She denies muscle and motor weakness. Referral to Infectious Disease clinic was sent due to her reactive Hep C, and her liver enzymes were elevated. Overall, she states that she's doing well and offers no further complaint. ? ?Past Medical History:  ?Diagnosis Date  ? Anxiety   ? Asthma   ? Collagen vascular disease ([place])   ? COPD (chronic obstructive pulmonary disease) ([place])   ? Hepatitis C   ? Hypertension   ? ? ?Past Surgical History:  ?Procedure Laterality Date  ? ABDOMINAL HYSTERECTOMY    ? ? ?Family History  ?Problem Relation Age of Onset  ? Depression Mother   ? Hypertension Mother   ? Heart attack Mother   ? Insomnia Mother   ? Thyroid disease Mother   ? Bipolar disorder Mother   ? Parkinson's disease Father   ? Arthritis Maternal Grandmother   ? Bipolar disorder Maternal Grandmother   ? Depression Maternal Grandmother   ?  Other Maternal Grandfather   ?     unknown medical history  ? Heart attack Paternal Grandmother   ? Ovarian cancer Paternal Grandmother   ? Hyperlipidemia Paternal Grandfather   ? Hypertension Paternal Grandfather   ? Congestive Heart Failure Paternal Grandfather   ? Rheum arthritis Other   ? Osteoporosis Other   ? Clotting disorder Other   ? Stroke Other   ? ? ?Social History  ? ?Socioeconomic History  ? Marital status: Married  ?  Spouse name: Not on file  ? Number of children: Not on file  ? Years of education: Not on file  ? Highest education level: Not on file  ?Occupational History  ? Not on file  ?Tobacco Use  ? Smoking status: Every Day  ?  Packs/day: 1.00  ?  Years: 33.00  ?  Pack years: 33.00  ?  Types: Cigarettes  ? Smokeless tobacco: Never  ?Vaping Use  ? Vaping Use: Never used  ?Substance and Sexual Activity  ? Alcohol use: Yes  ?  Alcohol/week: 4.0 standard drinks  ?  Types: 4 Standard drinks or equivalent per week  ?  Comment: mixed drinks 2 drinks twice a week  ? Drug use: Not Currently  ?  Types: Cocaine, Marijuana  ?  Comment: last use ~04/2021  ? Sexual activity: Not on file  ?Other Topics Concern  ? Not on file  ?Social History Narrative  ? Not on file  ? ?  Social Determinants of Health  ? ?Financial Resource Strain: Not on file  ?Food Insecurity: No Food Insecurity  ? Worried About Charity fundraiser in the Last Year: Never true  ? Ran Out of Food in the Last Year: Never true  ?Transportation Needs: No Transportation Needs  ? Lack of Transportation (Medical): No  ? Lack of Transportation (Non-Medical): No  ?Physical Activity: Not on file  ?Stress: Not on file  ?Social Connections: Not on file  ?Intimate Partner Violence: Not on file  ? ? ?Outpatient Medications Prior to Visit  ?Medication Sig Dispense Refill  ? albuterol (PROVENTIL HFA) 108 (90 Base) MCG/ACT inhaler Inhale 1-2 puffs into the lungs once every 6 (six) hours as needed for wheezing or shortness of breath. 6.7 g 5  ? Blood Pressure  KIT USE AS DIRECTED TWICE DAILY. 1 kit 0  ? fluticasone-salmeterol (WIXELA INHUB) 100-50 MCG/ACT AEPB Inhale 1 puff into the lungs 2 (two) times daily. 60 each 3  ? sodium chloride (OCEAN) 0.65 % SOLN nasal spray Place 1 spray into both nostrils daily as needed for congestion. 30 mL 0  ? hydrALAZINE (APRESOLINE) 25 MG tablet Take 1 tablet (25 mg total) by mouth 3 (three) times daily. 90 tablet 0  ? lisinopril (ZESTRIL) 20 MG tablet Take 1 tablet (20 mg total) by mouth once daily. 30 tablet 0  ? ?No facility-administered medications prior to visit.  ? ? ?No Known Allergies ? ?ROS ?Review of Systems  ?Constitutional: Negative.   ?Eyes: Negative.   ?Respiratory: Negative.    ?Cardiovascular: Negative.   ?Neurological: Negative.   ? ?  ?Objective:  ?  ?Physical Exam ?Telephone visit ?There were no vitals taken for this visit. ?Wt Readings from Last 3 Encounters:  ?10/03/21 112 lb 1.6 oz (50.8 kg)  ?08/12/21 97 lb (44 kg)  ?09/07/20 111 lb 5.3 oz (50.5 kg)  ? ? ? ?Health Maintenance Due  ?Topic Date Due  ? COVID-19 Vaccine (1) Never done  ? TETANUS/TDAP  Never done  ? PAP SMEAR-Modifier  Never done  ? COLONOSCOPY (Pts 45-73yr Insurance coverage will need to be confirmed)  Never done  ? MAMMOGRAM  Never done  ? Zoster Vaccines- Shingrix (1 of 2) Never done  ? ? ?There are no preventive care reminders to display for this patient. ? ?No results found for: TSH ?Lab Results  ?Component Value Date  ? WBC 6.9 08/12/2021  ? HGB 12.5 08/12/2021  ? HCT 38.6 08/12/2021  ? MCV 94.8 08/12/2021  ? PLT 239 08/12/2021  ? ?Lab Results  ?Component Value Date  ? NA 141 10/03/2021  ? K 4.2 10/03/2021  ? CO2 19 (L) 10/03/2021  ? GLUCOSE 90 10/03/2021  ? BUN 17 10/03/2021  ? CREATININE 1.04 (H) 10/03/2021  ? BILITOT <0.2 10/03/2021  ? ALKPHOS 108 10/03/2021  ? AST 81 (H) 10/03/2021  ? ALT 73 (H) 10/03/2021  ? PROT 7.3 10/03/2021  ? ALBUMIN 3.6 (L) 10/03/2021  ? CALCIUM 9.6 10/03/2021  ? ANIONGAP 8 08/12/2021  ? EGFR 62 10/03/2021  ? ?Lab  Results  ?Component Value Date  ? CHOL 151 10/03/2021  ? ?Lab Results  ?Component Value Date  ? HDL 77 10/03/2021  ? ?Lab Results  ?Component Value Date  ? [place]57 10/03/2021  ? ?Lab Results  ?Component Value Date  ? TRIG 90 10/03/2021  ? ?Lab Results  ?Component Value Date  ? CHOLHDL 2.0 10/03/2021  ? ?No results found for: HGBA1C ? ?  ?Assessment &  Plan:  ? ?1. History of hypertension ?- Her blood pressure per patient is not under control, she will continue on current medication, DASH diet, encouraged smoking cessation and will follow up with Cardiologist. She was educated on signs and symptoms of stroke and advised to go to the ED. ?- lisinopril (ZESTRIL) 20 MG tablet; Take 1 tablet (20 mg total) by mouth once daily.  Dispense: 30 tablet; Refill: 0 ?- hydrALAZINE (APRESOLINE) 25 MG tablet; Take 1 tablet (25 mg total) by mouth 3 (three) times daily.  Dispense: 90 tablet; Refill: 0 ? ?2. Hip pain, chronic, right ?- She was started on Naproxen 500 mg daily, educated on medication side effects, advised to notify clinic. She will follow up with Dr Jefm [facility] at [facility]. ?- naproxen (NAPROSYN) 500 MG tablet; Take 1 tablet (500 mg total) by mouth once daily.  Dispense: 30 tablet; Refill: 1 ? ? ? ? ?Follow-up: Return in about 29 days (around 11/08/2021), or if symptoms worsen or fail to improve.  ? ? ?Cassanda Walmer Jerold Coombe, NP ?

## 2021-10-10 NOTE — Patient Instructions (Signed)

## 2021-10-11 ENCOUNTER — Telehealth: Payer: Self-pay | Admitting: Gerontology

## 2021-10-11 NOTE — Telephone Encounter (Signed)
Made appt and confirmed for 5/10 at 1:30 ?

## 2021-10-11 NOTE — Telephone Encounter (Signed)
-----   Message from Rolm Gala, NP sent at 10/10/2021  9:30 AM EDT ----- ?Pls schedule an in person F/U on 11/08/21 . Pls call and notify patient thank you ? ?

## 2021-10-17 ENCOUNTER — Other Ambulatory Visit: Payer: Self-pay | Admitting: Gerontology

## 2021-10-17 ENCOUNTER — Other Ambulatory Visit: Payer: Self-pay

## 2021-10-17 ENCOUNTER — Ambulatory Visit: Payer: Medicaid Other | Admitting: Licensed Clinical Social Worker

## 2021-10-17 DIAGNOSIS — Z8659 Personal history of other mental and behavioral disorders: Secondary | ICD-10-CM

## 2021-10-17 MED ORDER — QUETIAPINE FUMARATE 100 MG PO TABS
100.0000 mg | ORAL_TABLET | Freq: Every day | ORAL | 0 refills | Status: DC
  Filled 2021-10-17: qty 30, 30d supply, fill #0

## 2021-10-17 NOTE — BH Specialist Note (Signed)
Integrated Behavioral Health Follow Up In-Person Visit ? ?MRN: [id number] ?Name: Charonda Hefter Win ? ?Types of Service: Individual psychotherapy ? ?Interpretor:No. Interpretor Name and Language: N/A ? ?Subjective: ?Jaliana Medellin is a 61 y.o. female accompanied by  herself ?Patient was referred by Carlyon Shadow, NP for mental health. ?Patient reports the following symptoms/concerns: The patient reports that she has been doing the same since her last follow-up appointment. She noted that she continues to cycle between consecutive sleepless nights where she has abundant energy to very depressed days where she isolates from others and is tearful throughout her day. The patient discussed the impact her symptoms have on her daily life. She discussed situational stressors that are impacting her life currently. The patient stated that she agreed with the psychiatric consultant's recommendations and had no further questions. She noted that she understood how to access crisis care in the event she experienced a mental health crisis. The patient denied any suicidal or homicidal thoughts.  ?Duration of problem: Years; Severity of problem: moderate ? ?Objective: ?Mood: Euthymic and Affect: Appropriate ?Risk of harm to self or others: No plan to harm self or others ? ?Life Context: ?Family and Social: see above ?School/Work: see above ?Self-Care: see above ?Life Changes: see above ? ?Patient and/or Family's Strengths/Protective Factors: ?Concrete supports in place (healthy food, safe environments, etc.) and Sense of purpose ? ?Goals Addressed: ?Patient will: ? Reduce symptoms of: agitation, anxiety, depression, insomnia, mood instability, and stress  ? Increase knowledge and/or ability of: coping skills, healthy habits, self-management skills, and stress reduction  ? Demonstrate ability to: Increase healthy adjustment to current life circumstances ? ?Progress towards Goals: ?Ongoing ? ?Interventions: ?Interventions  utilized:  CBT Cognitive Behavioral Therapy was utilized by the clinician during today's follow up session. Clinician met with patient to identify needs related to stressors and functioning, and assess and monitor for signs and symptoms of anxiety and depression, and assess safety. The clinician processed with the patient how they have been doing since the last follow-up session. Clinician measured the patient's anxiety and depression on a numerical scale. Clinician explained that the volunteer Physiatrist Dr. Octavia Heir, MD on the case consultation team recommended that the patient start QUEtiapine (SEROQUEL) 100 MG at bedtime to help with her symptoms of mood instability and insomnia. Additionally it was recommended that the patient begin psychotherapy with Jerrilyn Cairo, LCSW-A at the [facility].Clinician utilized guidance and supervision, provided by psychiatric consultant, to inform patient of the benefits and risks of psychotropic medication use including a verbal summary of Black Box warnings. Patient was given the opportunity to ask questions and address concerns regarding the psychiatric consultants recommendations for treatment. Clinician provided the patient with information on where and how to access crisis services should the patient need them including [facility], calling 988 or 911, going to the the closest Emergency Department or calling [facility]'s 24/7 crisis line [phone].  The session ended with scheduling.    ?Standardized Assessments completed: Not Needed; completed PHQ-9 and Gad-7 last week and the patient reported no changes since that time. ? ?Assessment: ?Patient currently experiencing see above.  ? ?Patient may benefit from see above. ? ?Plan: ?Follow up with behavioral health clinician on : 10/25/2021 at 11:00 PM  ?Behavioral recommendations:  ?Referral(s): [place] (In Clinic) ?"From scale of 1-10, how likely are you to follow plan?":  ? ?[name], LCSWA ? ? ?

## 2021-10-19 LAB — URINE DRUG PANEL 7
Amphetamines, Urine: NEGATIVE ng/mL
Barbiturate Quant, Ur: NEGATIVE ng/mL
Benzodiazepine Quant, Ur: NEGATIVE ng/mL
Cannabinoid Quant, Ur: NEGATIVE ng/mL
Cocaine (Metab.): NEGATIVE ng/mL
Opiate Quant, Ur: NEGATIVE ng/mL
PCP Quant, Ur: NEGATIVE ng/mL

## 2021-10-25 ENCOUNTER — Ambulatory Visit: Payer: Self-pay | Admitting: Licensed Clinical Social Worker

## 2021-10-25 ENCOUNTER — Ambulatory Visit: Payer: Medicaid Other | Admitting: Licensed Clinical Social Worker

## 2021-10-25 DIAGNOSIS — F3160 Bipolar disorder, current episode mixed, unspecified: Secondary | ICD-10-CM

## 2021-10-25 NOTE — BH Specialist Note (Signed)
Integrated Behavioral Health via Telemedicine Visit ? ?10/25/2021 ?Nicole [name] ?[id number] ? ? ?Referring Provider: Carlyon Shadow, NP  ?Patient/Family location: The Patient's home address  ?[facility] Provider location: [facility] ?All persons participating in visit: Kristianne Albin. [employer] and [employer], LCSWA ?Types of Service: Telephone visit ? ?I connected with Carlyon Shadow [place] via  Telephone or Video Enabled Telemedicine Application  (Video is Caregility application) and verified that I am speaking with the correct person using two identifiers. Discussed confidentiality: Yes  ? ?I discussed the limitations of telemedicine and the availability of in person appointments.  Discussed there is a possibility of technology failure and discussed alternative modes of communication if that failure occurs. ? ?Patient and/or legal guardian expressed understanding and consented to Telemedicine visit: Yes  ? ?Presenting Concerns: ?Patient and/or family reports the following symptoms/concerns: The patient reports that she has been doing okay since her last follow-up appointment. She noted that she gave her bottle of Seroquel to her husband to help her not to abuse it. The patient reports that her husband gives her one Seroquel 100 MG at bedtime nightly. She noted that she has fallen asleep faster this week; usually within 30 minutes. The patient shared that she continues to wake up frequently throughout her night. She stated that she experiences nightmares half the nights in a week, but is not concerned by them. She shared that her overall mood has remained the same since her last follow-up. Amaura noted that she is experiencing severe swelling in her face and limbs. She shared that she is so swollen she does not look like herself and it is hard for her to walk. She shared that she felt it was a reaction to a blood pressure medication. The patient stated that she would go to the Emergency  Department today. Shiryl denied any homicidal or suicidal thoughts.  ?Duration of problem: Yes; Severity of problem: moderate ? ?Patient and/or Family's Strengths/Protective Factors: ?Concrete supports in place (healthy food, safe environments, etc.) ? ?Goals Addressed: ?Patient will: ? Reduce symptoms of: agitation, anxiety, depression, insomnia, and stress  ? Increase knowledge and/or ability of: coping skills, healthy habits, self-management skills, and stress reduction  ? Demonstrate ability to: Increase healthy adjustment to current life circumstances ? ?Progress towards Goals: ?Ongoing ? ?Interventions: ?Interventions utilized:  CBT Cognitive Behavioral Therapywas utilized by the clinician during today's follow up session. Clinician met with the patient to identify needs related to stressors and functioning, assess and monitor for signs and symptoms of anxiety and depression, and assess safety. The Clinician processed with the patient how they have been doing since the last follow-up session. Clinician provided a safe, judgment-free space for the patient to vent her frustration regarding her current life circumstances. Clinician offered to send a message to the patient's primary care provider regarding the patient's reports of severe swelling in her face and limbs. The patient agreed, and the Clinician messaged Carlyon Shadow, NP, who recommended the patient go to the ED. Clinician relayed to the patient that her PCP recommended that she go to the ED right away for treatment. The session ended with scheduling.   ?Standardized Assessments completed: GAD-7 and PHQ 9 ?GAD-7= 20 ?PHQ-9= 18 ? ?Assessment: ?Patient currently experiencing see above.  ? ?Patient may benefit from see above. ? ?Plan: ?Follow up with behavioral health clinician on : 11/01/2021 at 11:00 AM  ?Behavioral recommendations:  ?Referral(s): [place] (In Clinic) ? ?I discussed the assessment and treatment plan  with the patient and/or parent/guardian. They were provided an opportunity to ask questions and all were answered. They agreed with the plan and demonstrated an understanding of the instructions. ?  ?They were advised to call back or seek an in-person evaluation if the symptoms worsen or if the condition fails to improve as anticipated. ? ?[name], LCSWA ?

## 2021-10-31 ENCOUNTER — Other Ambulatory Visit (HOSPITAL_COMMUNITY): Payer: Self-pay

## 2021-10-31 ENCOUNTER — Encounter: Payer: Self-pay | Admitting: Infectious Diseases

## 2021-10-31 ENCOUNTER — Ambulatory Visit: Payer: Medicaid Other | Attending: Infectious Diseases | Admitting: Infectious Diseases

## 2021-10-31 VITALS — BP 172/111 | HR 106 | Temp 98.4°F | Ht 63.0 in | Wt 114.0 lb

## 2021-10-31 DIAGNOSIS — B182 Chronic viral hepatitis C: Secondary | ICD-10-CM | POA: Insufficient documentation

## 2021-10-31 DIAGNOSIS — F1011 Alcohol abuse, in remission: Secondary | ICD-10-CM

## 2021-10-31 DIAGNOSIS — I10 Essential (primary) hypertension: Secondary | ICD-10-CM | POA: Insufficient documentation

## 2021-10-31 DIAGNOSIS — K219 Gastro-esophageal reflux disease without esophagitis: Secondary | ICD-10-CM | POA: Insufficient documentation

## 2021-10-31 DIAGNOSIS — J449 Chronic obstructive pulmonary disease, unspecified: Secondary | ICD-10-CM | POA: Insufficient documentation

## 2021-10-31 DIAGNOSIS — F319 Bipolar disorder, unspecified: Secondary | ICD-10-CM | POA: Insufficient documentation

## 2021-10-31 DIAGNOSIS — I2510 Atherosclerotic heart disease of native coronary artery without angina pectoris: Secondary | ICD-10-CM | POA: Insufficient documentation

## 2021-10-31 DIAGNOSIS — F17210 Nicotine dependence, cigarettes, uncomplicated: Secondary | ICD-10-CM | POA: Insufficient documentation

## 2021-10-31 NOTE — Patient Instructions (Addendum)
You are here to engage in [facility] care- we need to do labs and apply for assistance for medication- please stop drinking alcohol so that your liver function will improve . You need to go to [facility] for lab work ?

## 2021-10-31 NOTE — Progress Notes (Signed)
NAME: Nicole [name]  ?DOB: 22-Dec-1960  ?MRN: [id number]  ?Date/Time: 10/31/2021 9:33 AM ? ?REQUESTING PROVIDER ?Subjective:  ?REASON FOR CONSULT:  ?? ?Nicole [name] is a 61 y.o. with a history of  COPD, current daily smoker, hypertension, bipolar disorder , MRSA empyema, AUD, past history of substance use is referred to me for hepatitis C treatment. ?Patient is actively drinking alcohol. ?She denies current use of illegal substance ?She is a smoker ?She has itching around her eyes because of allergies. ?She does not complain of any vomiting, diarrhea, blood in the stool.  No pain abdomen ?No distention of the abdomen ?No fever or shortness of breath. ? ? ?Past Medical History:  ?Diagnosis Date  ? Anxiety   ? Asthma   ? Collagen vascular disease ([place])   ? COPD (chronic obstructive pulmonary disease) ([place])   ? Hepatitis C   ? Hypertension   ?  ?Past Surgical History:  ?Procedure Laterality Date  ? ABDOMINAL HYSTERECTOMY    ?  ?Social History  ? ?Socioeconomic History  ? Marital status: Married  ?  Spouse name: Not on file  ? Number of children: Not on file  ? Years of education: Not on file  ? Highest education level: Not on file  ?Occupational History  ? Not on file  ?Tobacco Use  ? Smoking status: Every Day  ?  Packs/day: 1.00  ?  Years: 33.00  ?  Pack years: 33.00  ?  Types: Cigarettes  ? Smokeless tobacco: Never  ?Vaping Use  ? Vaping Use: Never used  ?Substance and Sexual Activity  ? Alcohol use: Yes  ?  Alcohol/week: 4.0 standard drinks  ?  Types: 4 Standard drinks or equivalent per week  ?  Comment: mixed drinks 2 drinks twice a week  ? Drug use: Not Currently  ?  Types: Cocaine, Marijuana  ?  Comment: last use ~04/2021  ? Sexual activity: Not on file  ?Other Topics Concern  ? Not on file  ?Social History Narrative  ? Not on file  ? ?Social Determinants of Health  ? ?Financial Resource Strain: Not on file  ?Food Insecurity: No Food Insecurity  ? Worried About Charity fundraiser in the Last Year:  Never true  ? Ran Out of Food in the Last Year: Never true  ?Transportation Needs: No Transportation Needs  ? Lack of Transportation (Medical): No  ? Lack of Transportation (Non-Medical): No  ?Physical Activity: Not on file  ?Stress: Not on file  ?Social Connections: Not on file  ?Intimate Partner Violence: Not on file  ?  ?Family History  ?Problem Relation Age of Onset  ? Depression Mother   ? Hypertension Mother   ? Heart attack Mother   ? Insomnia Mother   ? Thyroid disease Mother   ? Bipolar disorder Mother   ? Parkinson's disease Father   ? Arthritis Maternal Grandmother   ? Bipolar disorder Maternal Grandmother   ? Depression Maternal Grandmother   ? Other Maternal Grandfather   ?     unknown medical history  ? Heart attack Paternal Grandmother   ? Ovarian cancer Paternal Grandmother   ? Hyperlipidemia Paternal Grandfather   ? Hypertension Paternal Grandfather   ? Congestive Heart Failure Paternal Grandfather   ? Rheum arthritis Other   ? Osteoporosis Other   ? Clotting disorder Other   ? Stroke Other   ? ?No Known Allergies ?I? ?Current Outpatient Medications  ?Medication Sig Dispense Refill  ? albuterol (PROVENTIL  HFA) 108 (90 Base) MCG/ACT inhaler Inhale 1-2 puffs into the lungs once every 6 (six) hours as needed for wheezing or shortness of breath. 6.7 g 5  ? Blood Pressure KIT USE AS DIRECTED TWICE DAILY. 1 kit 0  ? fluticasone-salmeterol (WIXELA INHUB) 100-50 MCG/ACT AEPB Inhale 1 puff into the lungs 2 (two) times daily. 60 each 3  ? hydrALAZINE (APRESOLINE) 25 MG tablet Take 1 tablet (25 mg total) by mouth 3 (three) times daily. 90 tablet 0  ? lisinopril (ZESTRIL) 20 MG tablet Take 1 tablet (20 mg total) by mouth once daily. 30 tablet 0  ? naproxen (NAPROSYN) 500 MG tablet Take 1 tablet (500 mg total) by mouth once daily. 30 tablet 1  ? QUEtiapine (SEROQUEL) 100 MG tablet Take 1 tablet (100 mg total) by mouth once nightly at bedtime. 30 tablet 0  ? sodium chloride (OCEAN) 0.65 % SOLN nasal spray Place 1  spray into both nostrils daily as needed for congestion. 30 mL 0  ? ?No current facility-administered medications for this visit.  ?  ? ?Abtx:  ?Anti-infectives (From admission, onward)  ? ? None  ? ?  ? ? ?REVIEW OF SYSTEMS:  ?Const: negative fever, negative chills, has weight loss ?Eyes: Itching around the eyes.  Left more than right  ?ENT: negative coryza, negative sore throat ?Resp: negative cough, hemoptysis, dyspnea ?Cards: negative for chest pain, palpitations, lower extremity edema ?GU: negative for frequency, dysuria and hematuria ?GI: Negative for abdominal pain, diarrhea, bleeding, constipation ?Skin: negative for rash and pruritus ?Heme: negative for easy bruising and gum/nose bleeding ?MS: Generalized weakness ?Neurolo:negative for headaches, dizziness, vertigo, memory problems  ?Psych: negative for feelings of anxiety, depression  ?Endocrine: negative for thyroid, diabetes ?Allergy/Immunology- negative for any medication or food allergies ? ?Objective:  ?VITALS:  ?BP (!) 172/111   Pulse (!) 106   Temp 98.4 ?F (36.9 ?C) (Temporal)   Ht '5\' 3"'  (1.6 m)   Wt 114 lb (51.7 kg)   BMI 20.19 kg/m?  ?PHYSICAL EXAM:  ?General: Alert, cooperative, no distress, appears stated age.  Frail ?Head: Normocephalic, without obvious abnormality, atraumatic. ?Eyes: Skin around the eyes slightly pinkish.  Conjunctivae clear, anicteric sclerae. Pupils are equal ?ENT Nares normal. No drainage or sinus tenderness. ?Lips, mucosa, and tongue normal. No Thrush ?Neck: Supple, symmetrical, no adenopathy, thyroid: non tender ?no carotid bruit and no JVD. ?Back: No CVA tenderness. ?Lungs: Bilateral air entry ?Heart: Regular rate and rhythm, no murmur, rub or gallop. ?Abdomen: Soft, non-tender,not distended. Bowel sounds normal. No masses ?Extremities: atraumatic, no cyanosis. No edema. No clubbing ?Skin: No rashes or lesions. Or bruising ?Lymph: Cervical, supraclavicular normal. ?Neurologic: Grossly non-focal ?Pertinent Labs ?Lab  Results ?CBC ?   ?Component Value Date/Time  ? WBC 6.9 08/12/2021 2111  ? RBC 4.07 08/12/2021 2111  ? HGB 12.5 08/12/2021 2111  ? HCT 38.6 08/12/2021 2111  ? PLT 239 08/12/2021 2111  ? MCV 94.8 08/12/2021 2111  ? MCH 30.7 08/12/2021 2111  ? MCHC 32.4 08/12/2021 2111  ? RDW 15.5 08/12/2021 2111  ? LYMPHSABS 3.7 09/06/2020 0742  ? MONOABS 0.9 09/06/2020 0742  ? EOSABS 0.1 09/06/2020 0742  ? BASOSABS 0.1 09/06/2020 2703  ? ? ? ?  Latest Ref Rng & Units 10/03/2021  ? 10:43 AM 08/12/2021  ?  9:11 PM 09/07/2020  ?  2:38 AM  ?CMP  ?Glucose 70 - 99 mg/dL 90   99   98    ?BUN 6 - 24 mg/dL 17  19   20    ?Creatinine 0.57 - 1.00 mg/dL 1.04   1.32   1.24    ?Sodium 134 - 144 mmol/L 141   138   131    ?Potassium 3.5 - 5.2 mmol/L 4.2   3.3   4.1    ?Chloride 96 - 106 mmol/L 107   104   98    ?CO2 20 - 29 mmol/L '19   26   22    ' ?Calcium 8.7 - 10.2 mg/dL 9.6   8.8   9.6    ?Total Protein 6.0 - 8.5 g/dL 7.3      ?Total Bilirubin 0.0 - 1.2 mg/dL <0.2      ?Alkaline Phos 44 - 121 IU/L 108      ?AST 0 - 40 IU/L 81      ?ALT 0 - 32 IU/L 73      ? ?Tests result DATE comment  ?HEPC RNA     ?HEPC  Genotype     ?Hepatitis B profile     ?Hepatitis A status     ?PT/PTT     ?AST, ALT, Bilirubin     ?Albumin     ?creatinine     ?HB/Platelet     ?HIV     ?AFP     ?TSH     ?comorbidities      ?tobacco yes    ?alcohol     ?drug     ?APRI Score     ?FIB 4 score     ?Current meds     ?Ultrasound Elastography     ?     ?     ?  ? ?? ?Impression/Recommendation ?Chronic hepatitis C without coma.  Viral load was 15 million in March 2022. ?Today we will check viral load genotype, PT PTT, TSH, alpha-fetoprotein, HIV, hepatitis B and a panel.  Hep B and hep A. ?Patient does not have insurance.  So she will have to get her labs done at [place] at our [facility].  We will also apply for assistance from the pharmaceutical.  We will likely do sofosbuvir/Velpat for 12 weeks. ? ?GERD she takes over-the-counter antacids ? ?Hypertension on lisinopril ?COPD on fluticasone and  bronchodilators ?? ?Alcohol use disorder.  Also to quit alcohol as abnormal transaminases. ?___________________________________________________ ?Follow in 1 month. ?Note:  This document was prepared using Drag

## 2021-11-01 ENCOUNTER — Ambulatory Visit: Payer: Medicaid Other | Admitting: Licensed Clinical Social Worker

## 2021-11-06 ENCOUNTER — Ambulatory Visit: Payer: Self-pay | Admitting: Cardiology

## 2021-11-08 ENCOUNTER — Ambulatory Visit: Payer: Medicaid Other | Admitting: Gerontology

## 2021-11-08 ENCOUNTER — Other Ambulatory Visit: Payer: Self-pay

## 2021-11-08 ENCOUNTER — Encounter: Payer: Self-pay | Admitting: Gerontology

## 2021-11-08 VITALS — BP 102/59 | HR 116 | Temp 98.1°F | Resp 16 | Ht 63.0 in | Wt 102.3 lb

## 2021-11-08 DIAGNOSIS — R000 Tachycardia, unspecified: Secondary | ICD-10-CM | POA: Insufficient documentation

## 2021-11-08 DIAGNOSIS — H9192 Unspecified hearing loss, left ear: Secondary | ICD-10-CM

## 2021-11-08 DIAGNOSIS — F17200 Nicotine dependence, unspecified, uncomplicated: Secondary | ICD-10-CM

## 2021-11-08 DIAGNOSIS — R059 Cough, unspecified: Secondary | ICD-10-CM | POA: Insufficient documentation

## 2021-11-08 DIAGNOSIS — Z8679 Personal history of other diseases of the circulatory system: Secondary | ICD-10-CM

## 2021-11-08 DIAGNOSIS — R051 Acute cough: Secondary | ICD-10-CM

## 2021-11-08 DIAGNOSIS — H9190 Unspecified hearing loss, unspecified ear: Secondary | ICD-10-CM | POA: Insufficient documentation

## 2021-11-08 MED ORDER — AMOXICILLIN-POT CLAVULANATE 875-125 MG PO TABS
1.0000 | ORAL_TABLET | Freq: Two times a day (BID) | ORAL | 0 refills | Status: DC
  Filled 2021-11-08: qty 10, 5d supply, fill #0

## 2021-11-08 MED ORDER — BENZONATATE 100 MG PO CAPS
100.0000 mg | ORAL_CAPSULE | Freq: Three times a day (TID) | ORAL | 0 refills | Status: DC | PRN
  Filled 2021-11-08: qty 20, 7d supply, fill #0

## 2021-11-08 MED ORDER — PREDNISONE 5 MG PO TABS
5.0000 mg | ORAL_TABLET | Freq: Every day | ORAL | 0 refills | Status: DC
  Filled 2021-11-08: qty 5, 5d supply, fill #0

## 2021-11-08 MED ORDER — LISINOPRIL 20 MG PO TABS
20.0000 mg | ORAL_TABLET | Freq: Every day | ORAL | 0 refills | Status: DC
  Filled 2021-11-08: qty 30, 30d supply, fill #0

## 2021-11-08 MED ORDER — HYDRALAZINE HCL 25 MG PO TABS
25.0000 mg | ORAL_TABLET | Freq: Three times a day (TID) | ORAL | 0 refills | Status: DC
  Filled 2021-11-08: qty 60, 20d supply, fill #0

## 2021-11-08 NOTE — Patient Instructions (Signed)
Smoking Tobacco Information, Adult Smoking tobacco can be harmful to your health. Tobacco contains a toxic colorless chemical called nicotine. Nicotine causes changes in your brain that make you want more and more. This is called addiction. This can make it hard to stop smoking once you start. Tobacco also has other toxic chemicals that can hurt your body and raise your risk of many cancers. Menthol or "lite" tobacco or cigarette brands are not safer than regular brands. How can smoking tobacco affect me? Smoking tobacco puts you at risk for: Cancer. Smoking is most commonly associated with lung cancer, but can also lead to cancer in other parts of the body. Chronic obstructive pulmonary disease (COPD). This is a long-term lung condition that makes it hard to breathe. It also gets worse over time. High blood pressure (hypertension), heart disease, stroke, heart attack, and lung infections, such as pneumonia. Cataracts. This is when the lenses in the eyes become clouded. Digestive problems. This may include peptic ulcers, heartburn, and gastroesophageal reflux disease (GERD). Oral health problems, such as gum disease, mouth sores, and tooth loss. Loss of taste and smell. Smoking also affects how you look and smell. Smoking may cause: Wrinkles. Yellow or stained teeth, fingers, and fingernails. Bad breath. Bad-smelling clothes and hair. Smoking tobacco can also affect your social life, because: It may be challenging to find places to smoke when away from home. Many workplaces, restaurants, hotels, and public places are tobacco-free. Smoking is expensive. This is due to the cost of tobacco and the long-term costs of treating health problems from smoking. Secondhand smoke may affect those around you. Secondhand smoke can cause lung cancer, breathing problems, and heart disease. Children of smokers have a higher risk for: Sudden infant death syndrome (SIDS). Ear infections. Lung infections. What  actions can I take to prevent health problems? Quit smoking  Do not start smoking. Quit if you already smoke. Do not replace cigarette smoking with vaping devices, such as e-cigarettes. Make a plan to quit smoking and commit to it. Look for programs to help you, and ask your health care provider for recommendations and ideas. Set a date and write down all the reasons you want to quit. Let your friends and family know you are quitting so they can help and support you. Consider finding friends who also want to quit. It can be easier to quit with someone else, so that you can support each other. Talk with your health care provider about using nicotine replacement medicines to help you quit. These include gum, lozenges, patches, sprays, or pills. If you try to quit but return to smoking, stay positive. It is common to slip up when you first quit, so take it one day at a time. Be prepared for cravings. When you feel the urge to smoke, chew gum or suck on hard candy. Lifestyle Stay busy. Take care of your body. Get plenty of exercise, eat a healthy diet, and drink plenty of water. Find ways to manage your stress, such as meditation, yoga, exercise, or time spent with friends and family. Ask your health care provider about having regular tests (screenings) to check for cancer. This may include blood tests, imaging tests, and other tests. Where to find support To get support to quit smoking, consider: Asking your health care provider for more information and resources. Joining a support group for people who want to quit smoking in your local community. There are many effective programs that may help you to quit. Calling the smokefree.gov counselor   helpline at 1-800-QUIT-NOW (1-800-784-8669). Where to find more information You may find more information about quitting smoking from: Centers for Disease Control and Prevention: cdc.gov/tobacco Smokefree.gov: smokefree.gov American Lung Association:  freedomfromsmoking.org Contact a health care provider if: You have problems breathing. Your lips, nose, or fingers turn blue. You have chest pain. You are coughing up blood. You feel like you will faint. You have other health changes that cause you to worry. Summary Smoking tobacco can negatively affect your health, the health of those around you, your finances, and your social life. Do not start smoking. Quit if you already smoke. If you need help quitting, ask your health care provider. Consider joining a support group for people in your local community who want to quit smoking. There are many effective programs that may help you to quit. This information is not intended to replace advice given to you by your health care provider. Make sure you discuss any questions you have with your health care provider. Document Revised: 06/13/2021 Document Reviewed: 06/13/2021 Elsevier Patient Education  2023 [employer]  

## 2021-11-08 NOTE — Progress Notes (Signed)
? ?Established Patient Office Visit ? ?Subjective   ?Patient ID: Nicole [name], female    DOB: 1960/12/27  Age: 61 y.o. MRN: [id number] ? ?Chief Complaint  ?Patient presents with  ? Follow-up  ? Hypertension  ?  Patient states she is checking her BP at home. She brought BP log with her today.  ? Cough  ?  Patient c/o productive cough x 1 week. Patient has not taken a covid test. Patient requesting an antibiotic.  ? ? ?HPI ? ?Nicole [name] is a 60y/o female who has history of bipolar, COPD, hypertension, asthma, anxiety, collagen vascular disease, hepatitis C , substance use disorder, presents for routine follow up. She c/o productive cough with greenish phlegm, and has not taking any medication.  She states that cough wakes him up at night, complaining of chest tightness, wheezing and intermittent shortness of breath.  She continues to smoke 1/2 pack of cigarette daily and admits the desire to quit.  She has not tested for COVID, she denies fever, chills, rhinorrhea and sore throat.  She  has a history of rheumatoid arthritis and her Rheumatoid factor checked on 10/03/21 was 38.6 IU/ml. She also c/o loss of hearing to her left ear that has been going on for more than 6 months, she denies any injury to the head. Her heart rate was 126 bpm, denies chest pain. Overall, she states that she is doing well but concerned about her cough. ? ?Review of Systems  ?Constitutional: Negative.   ?Respiratory:  Positive for cough, sputum production, shortness of breath and wheezing.   ?Cardiovascular: Negative.   ?Skin: Negative.   ?Neurological: Negative.   ?Psychiatric/Behavioral: Negative.    ? ?  ?Objective:  ?  ? ?BP (!) 102/59 (BP Location: Right Arm, Patient Position: Sitting, Cuff Size: Normal)   Pulse (!) 116   Temp 98.1 ?F (36.7 ?C) (Oral)   Resp 16   Ht 5' 3" (1.6 m)   Wt 102 lb 4.8 oz (46.4 kg)   SpO2 94%   BMI 18.12 kg/m?  ?BP Readings from Last 3 Encounters:  ?11/08/21 (!) 102/59  ?10/31/21 (!)  172/111  ?10/03/21 (!) 218/119  ? ?Wt Readings from Last 3 Encounters:  ?11/08/21 102 lb 4.8 oz (46.4 kg)  ?10/31/21 114 lb (51.7 kg)  ?10/03/21 112 lb 1.6 oz (50.8 kg)  ? ?  ? ?Physical Exam ?HENT:  ?   Head: Normocephalic and atraumatic.  ?   Mouth/Throat:  ?   Mouth: Mucous membranes are moist.  ?Cardiovascular:  ?   Rate and Rhythm: Tachycardia present.  ?Pulmonary:  ?   Breath sounds: Examination of the right-upper field reveals wheezing. Examination of the left-upper field reveals wheezing. Examination of the right-middle field reveals wheezing. Examination of the left-middle field reveals wheezing. Examination of the right-lower field reveals wheezing. Examination of the left-lower field reveals wheezing. Wheezing present.  ?Skin: ?   General: Skin is warm.  ?Neurological:  ?   General: No focal deficit present.  ?   Mental Status: She is alert and oriented to person, place, and time. Mental status is at baseline.  ?Psychiatric:     ?   Mood and Affect: Mood normal.     ?   Behavior: Behavior normal.     ?   Thought Content: Thought content normal.     ?   Judgment: Judgment normal.  ? ? ? ?No results found for any visits on 11/08/21. ? ?Last CBC ?Lab Results  ?  Component Value Date  ? WBC 6.9 08/12/2021  ? HGB 12.5 08/12/2021  ? HCT 38.6 08/12/2021  ? MCV 94.8 08/12/2021  ? MCH 30.7 08/12/2021  ? RDW 15.5 08/12/2021  ? PLT 239 08/12/2021  ? ?Last metabolic panel ?Lab Results  ?Component Value Date  ? GLUCOSE 90 10/03/2021  ? NA 141 10/03/2021  ? K 4.2 10/03/2021  ? CL 107 (H) 10/03/2021  ? CO2 19 (L) 10/03/2021  ? BUN 17 10/03/2021  ? CREATININE 1.04 (H) 10/03/2021  ? EGFR 62 10/03/2021  ? CALCIUM 9.6 10/03/2021  ? PHOS 3.7 09/06/2020  ? PROT 7.3 10/03/2021  ? ALBUMIN 3.6 (L) 10/03/2021  ? LABGLOB 3.7 10/03/2021  ? AGRATIO 1.0 (L) 10/03/2021  ? BILITOT <0.2 10/03/2021  ? ALKPHOS 108 10/03/2021  ? AST 81 (H) 10/03/2021  ? ALT 73 (H) 10/03/2021  ? ANIONGAP 8 08/12/2021  ? ?Last lipids ?Lab Results  ?Component  Value Date  ? CHOL 151 10/03/2021  ? HDL 77 10/03/2021  ? [place] 57 10/03/2021  ? TRIG 90 10/03/2021  ? CHOLHDL 2.0 10/03/2021  ? ?Last thyroid functions ?No results found for: TSH, T3TOTAL, T4TOTAL, THYROIDAB ?Last vitamin D ?No results found for: 25OHVITD2, 25OHVITD3, VD25OH ?  ? ?The 10-year ASCVD risk score (Arnett DK, et al., 2019) is: 3.1% ? ?  ?Assessment & Plan:  ? ?1. History of hypertension ?-Her blood pressure is under control, will continue on current medication, DASH diet and encouraged on smoking cessation. ?- hydrALAZINE (APRESOLINE) 25 MG tablet; Take 1 tablet (25 mg total) by mouth 3 (three) times daily.  Dispense: 90 tablet; Refill: 0 ?- lisinopril (ZESTRIL) 20 MG tablet; Take 1 tablet (20 mg total) by mouth once daily.  Dispense: 30 tablet; Refill: 0 ? ?2. Hearing loss of left ear, unspecified hearing loss type ?- Unknown etiology for hearing loss, was encouraged to complete [facility] financial application for  - Ambulatory referral to ENT ? ?3. Acute cough ?- Generalized wheezing, will treat prophylactically with Augmentin because of her co-morbidities. ?- predniSONE (DELTASONE) 5 MG tablet; Take 1 tablet (5 mg total) by mouth once daily with breakfast for 5 days.  Dispense: 5 tablet; Refill: 0 ?- benzonatate (TESSALON PERLES) 100 MG capsule; Take 1 capsule (100 mg total) by mouth 3 (three) times daily as needed for cough.  Dispense: 20 capsule; Refill: 0 ?- amoxicillin-clavulanate (AUGMENTIN) 875-125 MG tablet; Take 1 tablet by mouth 2 (two) times daily for 5 days.  Dispense: 10 tablet; Refill: 0 ? ?4. Smoking ?- She was encouraged on smoking cessation, provided with [place] Quitline information and will follow up for - CT CHEST LUNG CA SCREEN LOW DOSE W/O CM; Future ? ?5. Tachycardia ?- Her heart rate decreased after drinking 8 oz of water to 116 bpm. She was encouraged to increase water intake, go to the ED for worsening symptoms. ? ? ?Return in about 1 month (around 12/12/2021), or if symptoms worsen or  fail to improve.  ? ? ? Jerold Coombe, NP ? ?

## 2021-11-09 ENCOUNTER — Telehealth: Payer: Self-pay | Admitting: Pharmacy Technician

## 2021-11-09 ENCOUNTER — Other Ambulatory Visit: Payer: Self-pay

## 2021-11-09 ENCOUNTER — Other Ambulatory Visit: Payer: Medicaid Other

## 2021-11-09 ENCOUNTER — Other Ambulatory Visit: Payer: Self-pay | Admitting: Infectious Diseases

## 2021-11-09 DIAGNOSIS — B182 Chronic viral hepatitis C: Secondary | ICD-10-CM

## 2021-11-09 NOTE — Telephone Encounter (Signed)
Patient only signed DOH Attestation.  Would need to provide current year's poi if PAP medications were needed. ? ?Raelan Burgoon J. Icarus Partch ?Patient Advocate Specialist ?[facility]  ?

## 2021-11-15 ENCOUNTER — Telehealth: Payer: Self-pay | Admitting: Licensed Clinical Social Worker

## 2021-11-15 NOTE — Telephone Encounter (Signed)
Called the patient to reschedule the her  IBH follow-up appointment. Left a voicemail with contact information for [facility] so she may reschedule.  ?

## 2021-12-05 ENCOUNTER — Other Ambulatory Visit: Payer: Self-pay

## 2021-12-05 ENCOUNTER — Ambulatory Visit: Payer: Medicaid Other | Admitting: Licensed Clinical Social Worker

## 2021-12-05 ENCOUNTER — Ambulatory Visit: Payer: Self-pay | Admitting: Cardiology

## 2021-12-05 ENCOUNTER — Ambulatory Visit: Payer: Self-pay | Admitting: Licensed Clinical Social Worker

## 2021-12-05 ENCOUNTER — Ambulatory Visit: Payer: Medicaid Other | Admitting: Infectious Diseases

## 2021-12-05 ENCOUNTER — Other Ambulatory Visit: Payer: Self-pay | Admitting: Gerontology

## 2021-12-05 DIAGNOSIS — Z8679 Personal history of other diseases of the circulatory system: Secondary | ICD-10-CM

## 2021-12-05 DIAGNOSIS — F3160 Bipolar disorder, current episode mixed, unspecified: Secondary | ICD-10-CM

## 2021-12-05 DIAGNOSIS — Z8659 Personal history of other mental and behavioral disorders: Secondary | ICD-10-CM

## 2021-12-05 MED ORDER — HYDRALAZINE HCL 25 MG PO TABS
25.0000 mg | ORAL_TABLET | Freq: Three times a day (TID) | ORAL | 0 refills | Status: DC
  Filled 2021-12-05: qty 90, 30d supply, fill #0

## 2021-12-05 MED ORDER — QUETIAPINE FUMARATE 100 MG PO TABS
100.0000 mg | ORAL_TABLET | Freq: Every day | ORAL | 0 refills | Status: DC
  Filled 2021-12-05: qty 30, 30d supply, fill #0

## 2021-12-05 MED ORDER — LISINOPRIL 20 MG PO TABS
20.0000 mg | ORAL_TABLET | Freq: Every day | ORAL | 0 refills | Status: DC
  Filled 2021-12-05: qty 30, 30d supply, fill #0

## 2021-12-05 NOTE — BH Specialist Note (Signed)
Integrated Behavioral Health via Telemedicine Visit  12/05/2021 Shelsie Tijerino Holzheimer [id number]   Referring Provider: Carlyon Shadow, NP  Patient/Family location: The patient's address  [facility] Provider location: [facility] [place]  All persons participating in visit: Josefine Class of Service: Telephone visit  I connected with Carlyon Shadow Infantino  via  Telephone or Video Enabled Telemedicine Application  (Video is Caregility application) and verified that I am speaking with the correct person using two identifiers. Discussed confidentiality: Yes   I discussed the limitations of telemedicine and the availability of in person appointments.  Discussed there is a possibility of technology failure and discussed alternative modes of communication if that failure occurs.  Patient and/or legal guardian expressed understanding and consented to Telemedicine visit: Yes   Presenting Concerns: Patient and/or family reports the following symptoms/concerns: The patient reports that she has been doing worse since her last follow up appointment. She stated that she has not been able to fall asleep and has slept less than 30 minutes over the last 24 hours. She shared that she keeps her room cool and has to have the air conditioner running to help her breathe. The patient explained that she sleeps with 5-6 pillows to prop herself up on. She noted her room is her sanctuary and she retreats there often. The patient discussed family stressors impacting her life currently. She noted she is filing an eviction with the [employer] department to have her son's girlfriend removed from her home. The patient stated she feels the stress in her life is impacting her health. She shared she has no appetite and  does not ea everyday. She shared that she continues to lose weight and her hair is now falling out in clumps. The patient stated that her husband bought her multi-vitamins and she hopes this will help. The  patient stated she understood she had an appointment with her primary care provider and would request an earlier appointment if she needed. The patient discussed felling depressed daily and having no energy. She shared she felt anxious and on edge all the time. Dail noted that she had a panic attack yesterday following an argument with her son. She stated it felt like her heart was racing in her throat. The patient stated that she would keep her next follow-up appointment. She denied any suicidal or homicidal thoughts.  Duration of problem: Years; Severity of problem: moderate  Patient and/or Family's Strengths/Protective Factors: Social connections, Concrete supports in place (healthy food, safe environments, etc.), and Sense of purpose  Goals Addressed: Patient will:  Reduce symptoms of: agitation, anxiety, depression, insomnia, and stress   Increase knowledge and/or ability of: coping skills, healthy habits, self-management skills, and stress reduction   Demonstrate ability to: Increase healthy adjustment to current life circumstances  Progress towards Goals: Ongoing  Interventions: Interventions utilized:  CBT Cognitive Behavioral Therapywas utilized by the clinician during today's follow up session. Clinician met with patient to identify needs related to stressors and functioning, and assess and monitor for signs and symptoms of anxiety and depression, and assess safety. The clinician processed with the patient how they have been doing since the last follow-up session. Clinician measured the patients anxiety and depression on a numerical scale. Clinician provided a safe judgement free space for the patient to vent her frustrations regarding her current life circumstances. Clinician counseled patient on the importance of attending their follow-up appointments and discussed how consistence attendance is an essential part of the therapeutic process and how it  will help the patient make  progress. Clinician offered to message the client's primary care provider regarding her reports of not eating, sleeping, and hair loss and her request for a refill of Seroquel; the patient agreed. Clinician messaged the provider during the patient's visit. The clinician encouraged the patient to utilize their coping skills to deal with their current life circumstances and to incorporate self care into her daily routine. The session ended with scheduling.  Standardized Assessments completed: GAD-7 and PHQ 9 GAD-7= 20 PHQ-9= 18  Patient and/or Family Response: The patient stated she would be on time for her appointment on Wednesday June 14 th at 2:00 PM.   Assessment: Patient currently experiencing see above.   Patient may benefit from see above.  Plan: Follow up with behavioral health clinician on : 12/13/2021 at 2:00 Pm  Behavioral recommendations:  Referral(s): [place] (In Clinic)  I discussed the assessment and treatment plan with the patient and/or parent/guardian. They were provided an opportunity to ask questions and all were answered. They agreed with the plan and demonstrated an understanding of the instructions.   They were advised to call back or seek an in-person evaluation if the symptoms worsen or if the condition fails to improve as anticipated.  [name], LCSWA

## 2021-12-08 NOTE — Telephone Encounter (Signed)
Made in error

## 2021-12-12 ENCOUNTER — Other Ambulatory Visit: Payer: Self-pay | Admitting: Gerontology

## 2021-12-12 ENCOUNTER — Other Ambulatory Visit: Payer: Self-pay

## 2021-12-12 ENCOUNTER — Ambulatory Visit: Payer: Medicaid Other | Admitting: Gerontology

## 2021-12-12 ENCOUNTER — Encounter: Payer: Self-pay | Admitting: Gerontology

## 2021-12-12 VITALS — BP 175/113 | HR 104 | Temp 98.0°F | Resp 18 | Ht 63.0 in | Wt 109.0 lb

## 2021-12-12 DIAGNOSIS — Z8679 Personal history of other diseases of the circulatory system: Secondary | ICD-10-CM

## 2021-12-12 DIAGNOSIS — R051 Acute cough: Secondary | ICD-10-CM

## 2021-12-12 DIAGNOSIS — F17200 Nicotine dependence, unspecified, uncomplicated: Secondary | ICD-10-CM

## 2021-12-12 MED ORDER — HYDRALAZINE HCL 25 MG PO TABS
25.0000 mg | ORAL_TABLET | Freq: Three times a day (TID) | ORAL | 0 refills | Status: DC
  Filled 2021-12-12 – 2022-01-03 (×2): qty 90, 30d supply, fill #0

## 2021-12-12 MED ORDER — BENZONATATE 100 MG PO CAPS
100.0000 mg | ORAL_CAPSULE | Freq: Three times a day (TID) | ORAL | 0 refills | Status: DC | PRN
  Filled 2021-12-12: qty 20, 7d supply, fill #0

## 2021-12-12 MED ORDER — GUAIFENESIN 100 MG/5ML PO LIQD
100.0000 mg | ORAL | 0 refills | Status: DC | PRN
  Filled 2021-12-12: qty 240, 8d supply, fill #0

## 2021-12-12 MED ORDER — LISINOPRIL 20 MG PO TABS
20.0000 mg | ORAL_TABLET | Freq: Every day | ORAL | 0 refills | Status: DC
  Filled 2021-12-12 – 2022-01-03 (×2): qty 30, 30d supply, fill #0

## 2021-12-12 NOTE — Progress Notes (Signed)
Established Patient Office Visit  Subjective   Patient ID: Nicole [name], female    DOB: January 16, 1961  Age: 61 y.o. MRN: [id number]  Chief Complaint  Patient presents with   Follow-up   Hypertension    HPI  Nicole [name] is a 61y/o female who has history of bipolar, COPD, hypertension, asthma, anxiety, collagen vascular disease, hepatitis C , substance use disorder, presents for routine follow up visit. Her Lisinopril was increased to 20 mg daily and 25 mg Hydralazine tid during her last visit on 11/08/21. She reports being compliant with her medications, checks her blood pressure at home but per patient it was usually in the 200's/100.  During visits, her blood pressure was 175/113. She missed her Cardiology appointment, denies chest pain, light headedness and vision changes.  She was treated with Augmentin,Prednisone and Tessalon Perles for generalized wheezing and cough. She continues to experience productive cough, per patient with productive greenish phlegm. She denies fever, chills and shortness of breath. She continues to smoke 1 pack of cigarette daily and denies the desire to quit. Overall, she states that she's not feeling well.  Review of Systems  Constitutional: Negative.   Respiratory:  Positive for cough. Negative for shortness of breath.   Cardiovascular: Negative.   Neurological: Negative.   Psychiatric/Behavioral: Negative.  The patient does not have insomnia.       Objective:     BP (!) 175/113 (BP Location: Right Arm, Patient Position: Sitting, Cuff Size: Normal)   Pulse (!) 104   Temp 98 F (36.7 C) (Oral)   Resp 18   Ht '5\' 3"'  (1.6 m)   Wt 109 lb (49.4 kg)   SpO2 93%   BMI 19.31 kg/m  BP Readings from Last 3 Encounters:  12/12/21 (!) 175/113  11/08/21 (!) 102/59  10/31/21 (!) 172/111   Wt Readings from Last 3 Encounters:  12/12/21 109 lb (49.4 kg)  11/08/21 102 lb 4.8 oz (46.4 kg)  10/31/21 114 lb (51.7 kg)      Physical Exam HENT:      Head: Normocephalic and atraumatic.  Eyes:     Extraocular Movements: Extraocular movements intact.     Conjunctiva/sclera: Conjunctivae normal.     Pupils: Pupils are equal, round, and reactive to light.  Pulmonary:     Effort: No tachypnea, bradypnea, accessory muscle usage or respiratory distress.     Breath sounds: No decreased air movement. Examination of the right-upper field reveals rhonchi. Examination of the left-upper field reveals rhonchi. Examination of the right-middle field reveals rhonchi. Examination of the left-middle field reveals rhonchi. Examination of the right-lower field reveals rhonchi. Examination of the left-lower field reveals rhonchi. Rhonchi present. No decreased breath sounds.  Skin:    General: Skin is warm.  Neurological:     General: No focal deficit present.     Mental Status: She is alert and oriented to person, place, and time. Mental status is at baseline.  Psychiatric:        Mood and Affect: Mood normal.      No results found for any visits on 12/12/21.  Last CBC Lab Results  Component Value Date   WBC 6.9 08/12/2021   HGB 12.5 08/12/2021   HCT 38.6 08/12/2021   MCV 94.8 08/12/2021   MCH 30.7 08/12/2021   RDW 15.5 08/12/2021   PLT 239 54/27/0623   Last metabolic panel Lab Results  Component Value Date   GLUCOSE 90 10/03/2021   NA 141 10/03/2021  K 4.2 10/03/2021   CL 107 (H) 10/03/2021   CO2 19 (L) 10/03/2021   BUN 17 10/03/2021   CREATININE 1.04 (H) 10/03/2021   EGFR 62 10/03/2021   CALCIUM 9.6 10/03/2021   PHOS 3.7 09/06/2020   PROT 7.3 10/03/2021   ALBUMIN 3.6 (L) 10/03/2021   LABGLOB 3.7 10/03/2021   AGRATIO 1.0 (L) 10/03/2021   BILITOT <0.2 10/03/2021   ALKPHOS 108 10/03/2021   AST 81 (H) 10/03/2021   ALT 73 (H) 10/03/2021   ANIONGAP 8 08/12/2021   Last lipids Lab Results  Component Value Date   CHOL 151 10/03/2021   HDL 77 10/03/2021   LDLCALC 57 10/03/2021   TRIG 90 10/03/2021   CHOLHDL 2.0 10/03/2021    Last hemoglobin A1c No results found for: "HGBA1C" Last thyroid functions No results found for: "TSH", "T3TOTAL", "T4TOTAL", "THYROIDAB"    The 10-year ASCVD risk score (Arnett DK, et al., 2019) is: 8.9%    Assessment & Plan:   1. History of hypertension - Her blood pressure was not under control, she will continue on current medication and schedule an appointment with the Cardiologist. She was educated on the signs and symptoms of Stroke and advised to go to the ED. She's to continue on DASH diet. - lisinopril (ZESTRIL) 20 MG tablet; Take 1 tablet (20 mg total) by mouth once daily.  Dispense: 30 tablet; Refill: 0 - hydrALAZINE (APRESOLINE) 25 MG tablet; Take 1 tablet (25 mg total) by mouth 3 (three) times daily.  Dispense: 90 tablet; Refill: 0  2. Smoking - She was strongly encouraged on smoking cessation, referred for Low Dose lung CT and provided [place] Quitline information.  3. Acute cough - She has expiratory rhonchi to lung fields, was encouraged to have Chest X ray done, was started on Guaifenesin during the day and Benzonatate as needed. She was advised to go to the ED for worsening symptoms. - guaiFENesin (ROBITUSSIN) 100 MG/5ML liquid; Take 5 mLs (100 mg total) by mouth every 4 (four) hours as needed for cough or to loosen phlegm. Take during the day  Dispense: 240 mL; Refill: 0 - benzonatate (TESSALON PERLES) 100 MG capsule; Take 1 capsule (100 mg total) by mouth 3 (three) times daily as needed for cough.  Dispense: 20 capsule; Refill: 0 - DG Chest 2 View; Future   Return in about 22 days (around 01/03/2022), or if symptoms worsen or fail to improve.    Devynne Sturdivant Jerold Coombe, NP

## 2021-12-12 NOTE — Patient Instructions (Signed)
Smoking Tobacco Information, Adult Smoking tobacco can be harmful to your health. Tobacco contains a toxic colorless chemical called nicotine. Nicotine causes changes in your brain that make you want more and more. This is called addiction. This can make it hard to stop smoking once you start. Tobacco also has other toxic chemicals that can hurt your body and raise your risk of many cancers. Menthol or "lite" tobacco or cigarette brands are not safer than regular brands. How can smoking tobacco affect me? Smoking tobacco puts you at risk for: Cancer. Smoking is most commonly associated with lung cancer, but can also lead to cancer in other parts of the body. Chronic obstructive pulmonary disease (COPD). This is a long-term lung condition that makes it hard to breathe. It also gets worse over time. High blood pressure (hypertension), heart disease, stroke, heart attack, and lung infections, such as pneumonia. Cataracts. This is when the lenses in the eyes become clouded. Digestive problems. This may include peptic ulcers, heartburn, and gastroesophageal reflux disease (GERD). Oral health problems, such as gum disease, mouth sores, and tooth loss. Loss of taste and smell. Smoking also affects how you look and smell. Smoking may cause: Wrinkles. Yellow or stained teeth, fingers, and fingernails. Bad breath. Bad-smelling clothes and hair. Smoking tobacco can also affect your social life, because: It may be challenging to find places to smoke when away from home. Many workplaces, restaurants, hotels, and public places are tobacco-free. Smoking is expensive. This is due to the cost of tobacco and the long-term costs of treating health problems from smoking. Secondhand smoke may affect those around you. Secondhand smoke can cause lung cancer, breathing problems, and heart disease. Children of smokers have a higher risk for: Sudden infant death syndrome (SIDS). Ear infections. Lung infections. What  actions can I take to prevent health problems? Quit smoking  Do not start smoking. Quit if you already smoke. Do not replace cigarette smoking with vaping devices, such as e-cigarettes. Make a plan to quit smoking and commit to it. Look for programs to help you, and ask your health care provider for recommendations and ideas. Set a date and write down all the reasons you want to quit. Let your friends and family know you are quitting so they can help and support you. Consider finding friends who also want to quit. It can be easier to quit with someone else, so that you can support each other. Talk with your health care provider about using nicotine replacement medicines to help you quit. These include gum, lozenges, patches, sprays, or pills. If you try to quit but return to smoking, stay positive. It is common to slip up when you first quit, so take it one day at a time. Be prepared for cravings. When you feel the urge to smoke, chew gum or suck on hard candy. Lifestyle Stay busy. Take care of your body. Get plenty of exercise, eat a healthy diet, and drink plenty of water. Find ways to manage your stress, such as meditation, yoga, exercise, or time spent with friends and family. Ask your health care provider about having regular tests (screenings) to check for cancer. This may include blood tests, imaging tests, and other tests. Where to find support To get support to quit smoking, consider: Asking your health care provider for more information and resources. Joining a support group for people who want to quit smoking in your local community. There are many effective programs that may help you to quit. Calling the smokefree.gov counselor   helpline at 1-800-QUIT-NOW (1-800-784-8669). Where to find more information You may find more information about quitting smoking from: Centers for Disease Control and Prevention: cdc.gov/tobacco Smokefree.gov: smokefree.gov American Lung Association:  freedomfromsmoking.org Contact a health care provider if: You have problems breathing. Your lips, nose, or fingers turn blue. You have chest pain. You are coughing up blood. You feel like you will faint. You have other health changes that cause you to worry. Summary Smoking tobacco can negatively affect your health, the health of those around you, your finances, and your social life. Do not start smoking. Quit if you already smoke. If you need help quitting, ask your health care provider. Consider joining a support group for people in your local community who want to quit smoking. There are many effective programs that may help you to quit. This information is not intended to replace advice given to you by your health care provider. Make sure you discuss any questions you have with your health care provider. Document Revised: 06/13/2021 Document Reviewed: 06/13/2021 Elsevier Patient Education  2023 [employer]  

## 2021-12-13 ENCOUNTER — Ambulatory Visit: Payer: Medicaid Other | Admitting: Licensed Clinical Social Worker

## 2021-12-13 ENCOUNTER — Ambulatory Visit: Payer: Self-pay | Admitting: Licensed Clinical Social Worker

## 2021-12-13 DIAGNOSIS — F3160 Bipolar disorder, current episode mixed, unspecified: Secondary | ICD-10-CM

## 2021-12-13 NOTE — BH Specialist Note (Signed)
Integrated Behavioral Health via Telemedicine Visit  12/13/2021 Nicole [name] [id number]   Referring Provider: Carlyon Shadow, NP  Patient/Family location: The patient's home  [facility] Provider location: [facility]  All persons participating in visit: Nicole [name]. [name] and [employer], LCSW-A Types of Service: Telephone visit  I connected with Carlyon Shadow Sween via  Telephone or Video Enabled Telemedicine Application  (Video is Caregility application) and verified that I am speaking with the correct person using two identifiers. Discussed confidentiality: Yes   I discussed the limitations of telemedicine and the availability of in person appointments.  Discussed there is a possibility of technology failure and discussed alternative modes of communication if that failure occurs.  Patient and/or legal guardian expressed understanding and consented to Telemedicine visit: Yes   Presenting Concerns: Patient and/or family reports the following symptoms/concerns: The patient reports that she has been doing worse since her last follow-up appointment. She explained that she has not slept but for maybe 30 minutes this week. She shared that she took 3 -100 MG Seroquel two nights ago and still did not fall asleep. The patient requested the Clinician message her provider to see if she could have a refill of Seroquel. Adlean discussed family stressors impacting her life currently. She shared that she is in the process of evicting someone from her home. She noted that she spends a lot of time propped in bed to avoid other people because she can not cope with conflict. The patient discussed other situational stressors impacting her life currently. Racquel denied any suicidal or homicidal thoughts.  Duration of problem: Years; Severity of problem: moderate  Patient and/or Family's Strengths/Protective Factors: Concrete supports in place (healthy food, safe environments,  etc.)  Goals Addressed: Patient will:  Reduce symptoms of: anxiety, depression, insomnia, mood instability, and stress   Increase knowledge and/or ability of: coping skills, healthy habits, self-management skills, and stress reduction   Demonstrate ability to: Increase healthy adjustment to current life circumstances  Progress towards Goals: Ongoing  Interventions: Interventions utilized:  CBT Cognitive Behavioral Therapy was utilized by the clinician during today's follow up session. Clinician met with patient to identify needs related to stressors and functioning, and assess and monitor for signs and symptoms of anxiety and depression, and assess safety. The clinician processed with the patient how they have been doing since the last follow-up session. Clinician measured the patient's anxiety and depression on a numerical scale. Clinician introduced distress tolerance skills to the patient and explained that negative emotions will usually lessen in intensity and pass over time. Clinician offered to speak to the patient's primary care provider regarding her difficulties falling asleep and her request for a refill of Seroquel. Clinician encouraged the patient to go to [place] or the local emergency department if her symptoms worsened. Clinician informed the patient that she would present her case again for psychiatric consultation on Tuesday 12/19/2021 at 11:00 AM. The session ended with scheduling.  Standardized Assessments completed: GAD-7 and PHQ 9 GAD-7= 20 PHQ-9= 20  Assessment: Patient currently experiencing see above.   Patient may benefit from see above.  Plan: Follow up with behavioral health clinician on : 12/20/2021 at 2:00 PM  Behavioral recommendations:  Referral(s): [place] (In Clinic)  I discussed the assessment and treatment plan with the patient and/or parent/guardian. They were provided an opportunity to ask questions and all were answered. They  agreed with the plan and demonstrated an understanding of the instructions.   They  were advised to call back or seek an in-person evaluation if the symptoms worsen or if the condition fails to improve as anticipated.  [name], LCSWA

## 2021-12-14 ENCOUNTER — Ambulatory Visit
Admission: RE | Admit: 2021-12-14 | Discharge: 2021-12-14 | Disposition: A | Discharge status: Home / Self Care | Payer: Medicaid Other | Source: Ambulatory Visit | Attending: Gerontology | Admitting: Gerontology

## 2021-12-14 ENCOUNTER — Ambulatory Visit
Admission: RE | Admit: 2021-12-14 | Discharge: 2021-12-14 | Disposition: A | Discharge status: Home / Self Care | Payer: Medicaid Other | Attending: Gerontology | Admitting: Gerontology

## 2021-12-14 DIAGNOSIS — R051 Acute cough: Secondary | ICD-10-CM

## 2021-12-19 ENCOUNTER — Ambulatory Visit: Payer: Self-pay | Admitting: Infectious Diseases

## 2021-12-20 ENCOUNTER — Ambulatory Visit: Payer: Medicaid Other | Admitting: Licensed Clinical Social Worker

## 2022-01-03 ENCOUNTER — Ambulatory Visit: Payer: Medicaid Other | Admitting: Gerontology

## 2022-01-03 ENCOUNTER — Other Ambulatory Visit: Payer: Self-pay | Admitting: Gerontology

## 2022-01-03 ENCOUNTER — Other Ambulatory Visit: Payer: Self-pay

## 2022-01-03 DIAGNOSIS — R051 Acute cough: Secondary | ICD-10-CM

## 2022-01-03 DIAGNOSIS — Z8659 Personal history of other mental and behavioral disorders: Secondary | ICD-10-CM

## 2022-01-03 MED ORDER — BENZONATATE 100 MG PO CAPS
100.0000 mg | ORAL_CAPSULE | Freq: Three times a day (TID) | ORAL | 0 refills | Status: DC | PRN
  Filled 2022-01-03: qty 20, 7d supply, fill #0

## 2022-01-03 MED ORDER — QUETIAPINE FUMARATE 100 MG PO TABS
100.0000 mg | ORAL_TABLET | Freq: Every day | ORAL | 0 refills | Status: DC
  Filled 2022-01-03: qty 30, 30d supply, fill #0

## 2022-01-28 ENCOUNTER — Other Ambulatory Visit: Payer: Self-pay

## 2022-01-28 ENCOUNTER — Inpatient Hospital Stay
Admission: EM | Admit: 2022-01-28 | Discharge: 2022-01-30 | DRG: 896 | Disposition: A | Discharge status: Home / Self Care | Payer: Self-pay | Attending: Internal Medicine | Admitting: Internal Medicine

## 2022-01-28 ENCOUNTER — Emergency Department: Payer: Self-pay

## 2022-01-28 DIAGNOSIS — F17210 Nicotine dependence, cigarettes, uncomplicated: Secondary | ICD-10-CM | POA: Diagnosis present

## 2022-01-28 DIAGNOSIS — Z8709 Personal history of other diseases of the respiratory system: Secondary | ICD-10-CM

## 2022-01-28 DIAGNOSIS — F10259 Alcohol dependence with alcohol-induced psychotic disorder, unspecified: Secondary | ICD-10-CM | POA: Diagnosis present

## 2022-01-28 DIAGNOSIS — J449 Chronic obstructive pulmonary disease, unspecified: Secondary | ICD-10-CM | POA: Diagnosis present

## 2022-01-28 DIAGNOSIS — Z8659 Personal history of other mental and behavioral disorders: Secondary | ICD-10-CM

## 2022-01-28 DIAGNOSIS — B1920 Unspecified viral hepatitis C without hepatic coma: Secondary | ICD-10-CM | POA: Diagnosis present

## 2022-01-28 DIAGNOSIS — I160 Hypertensive urgency: Secondary | ICD-10-CM | POA: Diagnosis present

## 2022-01-28 DIAGNOSIS — Z820 Family history of epilepsy and other diseases of the nervous system: Secondary | ICD-10-CM

## 2022-01-28 DIAGNOSIS — F17200 Nicotine dependence, unspecified, uncomplicated: Secondary | ICD-10-CM | POA: Diagnosis present

## 2022-01-28 DIAGNOSIS — E43 Unspecified severe protein-calorie malnutrition: Secondary | ICD-10-CM | POA: Diagnosis present

## 2022-01-28 DIAGNOSIS — Z83438 Family history of other disorder of lipoprotein metabolism and other lipidemia: Secondary | ICD-10-CM

## 2022-01-28 DIAGNOSIS — Z818 Family history of other mental and behavioral disorders: Secondary | ICD-10-CM

## 2022-01-28 DIAGNOSIS — F319 Bipolar disorder, unspecified: Secondary | ICD-10-CM | POA: Diagnosis present

## 2022-01-28 DIAGNOSIS — I10 Essential (primary) hypertension: Secondary | ICD-10-CM | POA: Diagnosis present

## 2022-01-28 DIAGNOSIS — F1096 Alcohol use, unspecified with alcohol-induced persisting amnestic disorder: Secondary | ICD-10-CM | POA: Diagnosis present

## 2022-01-28 DIAGNOSIS — Z8349 Family history of other endocrine, nutritional and metabolic diseases: Secondary | ICD-10-CM

## 2022-01-28 DIAGNOSIS — Z7951 Long term (current) use of inhaled steroids: Secondary | ICD-10-CM

## 2022-01-28 DIAGNOSIS — R000 Tachycardia, unspecified: Secondary | ICD-10-CM | POA: Diagnosis present

## 2022-01-28 DIAGNOSIS — Z8679 Personal history of other diseases of the circulatory system: Secondary | ICD-10-CM

## 2022-01-28 DIAGNOSIS — Z8041 Family history of malignant neoplasm of ovary: Secondary | ICD-10-CM

## 2022-01-28 DIAGNOSIS — Z20822 Contact with and (suspected) exposure to covid-19: Secondary | ICD-10-CM | POA: Diagnosis present

## 2022-01-28 DIAGNOSIS — F10939 Alcohol use, unspecified with withdrawal, unspecified: Secondary | ICD-10-CM | POA: Diagnosis present

## 2022-01-28 DIAGNOSIS — E876 Hypokalemia: Secondary | ICD-10-CM | POA: Diagnosis present

## 2022-01-28 DIAGNOSIS — Z681 Body mass index (BMI) 19 or less, adult: Secondary | ICD-10-CM

## 2022-01-28 DIAGNOSIS — Z79899 Other long term (current) drug therapy: Secondary | ICD-10-CM

## 2022-01-28 DIAGNOSIS — Z8249 Family history of ischemic heart disease and other diseases of the circulatory system: Secondary | ICD-10-CM

## 2022-01-28 DIAGNOSIS — F10920 Alcohol use, unspecified with intoxication, uncomplicated: Principal | ICD-10-CM

## 2022-01-28 DIAGNOSIS — R197 Diarrhea, unspecified: Secondary | ICD-10-CM | POA: Diagnosis present

## 2022-01-28 DIAGNOSIS — K219 Gastro-esophageal reflux disease without esophagitis: Secondary | ICD-10-CM | POA: Diagnosis present

## 2022-01-28 DIAGNOSIS — Y908 Blood alcohol level of 240 mg/100 ml or more: Secondary | ICD-10-CM | POA: Diagnosis present

## 2022-01-28 DIAGNOSIS — Z8261 Family history of arthritis: Secondary | ICD-10-CM

## 2022-01-28 DIAGNOSIS — F10239 Alcohol dependence with withdrawal, unspecified: Principal | ICD-10-CM | POA: Diagnosis present

## 2022-01-28 DIAGNOSIS — F10229 Alcohol dependence with intoxication, unspecified: Secondary | ICD-10-CM | POA: Diagnosis present

## 2022-01-28 DIAGNOSIS — F411 Generalized anxiety disorder: Secondary | ICD-10-CM | POA: Diagnosis present

## 2022-01-28 HISTORY — DX: Bipolar disorder, unspecified: F31.9

## 2022-01-28 HISTORY — DX: Unspecified osteoarthritis, unspecified site: M19.90

## 2022-01-28 HISTORY — DX: Alcohol use, unspecified with withdrawal, unspecified: F10.939

## 2022-01-28 HISTORY — DX: Other psychoactive substance abuse, uncomplicated: F19.10

## 2022-01-28 LAB — COMPREHENSIVE METABOLIC PANEL
ALT: 88 U/L — ABNORMAL HIGH (ref 0–44)
AST: 187 U/L — ABNORMAL HIGH (ref 15–41)
Albumin: 3.6 g/dL (ref 3.5–5.0)
Alkaline Phosphatase: 86 U/L (ref 38–126)
Anion gap: 13 (ref 5–15)
BUN: 10 mg/dL (ref 6–20)
CO2: 20 mmol/L — ABNORMAL LOW (ref 22–32)
Calcium: 8.6 mg/dL — ABNORMAL LOW (ref 8.9–10.3)
Chloride: 109 mmol/L (ref 98–111)
Creatinine, Ser: 0.95 mg/dL (ref 0.44–1.00)
GFR, Estimated: >60 mL/min (ref >60)
Glucose, Bld: 169 mg/dL — ABNORMAL HIGH (ref 70–99)
Potassium: 2.6 mmol/L — CL (ref 3.5–5.1)
Sodium: 142 mmol/L (ref 135–145)
Total Bilirubin: 0.7 mg/dL (ref 0.3–1.2)
Total Protein: 7.9 g/dL (ref 6.5–8.1)

## 2022-01-28 LAB — SALICYLATE LEVEL: Salicylate Lvl: <7 mg/dL — ABNORMAL LOW (ref 7.0–30.0)

## 2022-01-28 LAB — CBC
HCT: 42.9 % (ref 36.0–46.0)
Hemoglobin: 14.9 g/dL (ref 12.0–15.0)
MCH: 36.1 pg — ABNORMAL HIGH (ref 26.0–34.0)
MCHC: 34.7 g/dL (ref 30.0–36.0)
MCV: 103.9 fL — ABNORMAL HIGH (ref 80.0–100.0)
Platelets: 125 10*3/uL — ABNORMAL LOW (ref 150–400)
RBC: 4.13 MIL/uL (ref 3.87–5.11)
RDW: 11.8 % (ref 11.5–15.5)
WBC: 4.9 10*3/uL (ref 4.0–10.5)
nRBC: 0 % (ref 0.0–0.2)

## 2022-01-28 LAB — MAGNESIUM: Magnesium: 1.7 mg/dL (ref 1.7–2.4)

## 2022-01-28 LAB — PHOSPHORUS: Phosphorus: 2.5 mg/dL (ref 2.5–4.6)

## 2022-01-28 LAB — POTASSIUM: Potassium: 4.6 mmol/L (ref 3.5–5.1)

## 2022-01-28 LAB — ETHANOL: Alcohol, Ethyl (B): 482 mg/dL (ref <10)

## 2022-01-28 LAB — ACETAMINOPHEN LEVEL: Acetaminophen (Tylenol), Serum: <10 ug/mL — ABNORMAL LOW (ref 10–30)

## 2022-01-28 MED ORDER — ONDANSETRON HCL 4 MG/2ML IJ SOLN
4.0000 mg | Freq: Once | INTRAMUSCULAR | Status: AC
  Administered 2022-01-28: 4 mg via INTRAVENOUS
  Filled 2022-01-28: qty 2

## 2022-01-28 MED ORDER — CHLORDIAZEPOXIDE HCL 25 MG PO CAPS
25.0000 mg | ORAL_CAPSULE | Freq: Once | ORAL | Status: AC
  Administered 2022-01-28: 25 mg via ORAL
  Filled 2022-01-28: qty 1

## 2022-01-28 MED ORDER — ACETAMINOPHEN 500 MG PO TABS
1000.0000 mg | ORAL_TABLET | Freq: Four times a day (QID) | ORAL | Status: DC | PRN
Start: 2022-01-28 — End: 2022-01-30
  Administered 2022-01-29: 1000 mg via ORAL
  Filled 2022-01-28: qty 2

## 2022-01-28 MED ORDER — LORAZEPAM 2 MG/ML IJ SOLN
0.0000 mg | Freq: Four times a day (QID) | INTRAMUSCULAR | Status: AC
  Administered 2022-01-28: 2 mg via INTRAVENOUS

## 2022-01-28 MED ORDER — CLONIDINE HCL 0.1 MG/24HR TD PTWK
0.1000 mg | MEDICATED_PATCH | TRANSDERMAL | Status: DC
  Administered 2022-01-28: 0.1 mg via TRANSDERMAL
  Filled 2022-01-28: qty 1

## 2022-01-28 MED ORDER — QUETIAPINE FUMARATE 25 MG PO TABS
100.0000 mg | ORAL_TABLET | Freq: Every day | ORAL | Status: DC
  Administered 2022-01-28 – 2022-01-29 (×2): 100 mg via ORAL
  Filled 2022-01-28 (×2): qty 4

## 2022-01-28 MED ORDER — LORAZEPAM 2 MG PO TABS
0.0000 mg | ORAL_TABLET | Freq: Four times a day (QID) | ORAL | Status: AC
  Administered 2022-01-28: 1 mg via ORAL
  Administered 2022-01-28 – 2022-01-29 (×3): 2 mg via ORAL
  Administered 2022-01-29: 1 mg via ORAL
  Administered 2022-01-30: 2 mg via ORAL
  Administered 2022-01-30: 1 mg via ORAL
  Filled 2022-01-28: qty 1
  Filled 2022-01-28: qty 2
  Filled 2022-01-28 (×5): qty 1

## 2022-01-28 MED ORDER — LORAZEPAM 2 MG/ML IJ SOLN: 0.0000 mg | Freq: Two times a day (BID) | INTRAMUSCULAR | Status: DC

## 2022-01-28 MED ORDER — POTASSIUM CHLORIDE CRYS ER 20 MEQ PO TBCR
60.0000 meq | EXTENDED_RELEASE_TABLET | Freq: Once | ORAL | Status: AC
  Administered 2022-01-28: 60 meq via ORAL
  Filled 2022-01-28: qty 3

## 2022-01-28 MED ORDER — POTASSIUM CHLORIDE 10 MEQ/100ML IV SOLN
10.0000 meq | INTRAVENOUS | Status: DC
  Administered 2022-01-28: 10 meq via INTRAVENOUS
  Filled 2022-01-28 (×2): qty 100

## 2022-01-28 MED ORDER — THIAMINE HCL 100 MG PO TABS
100.0000 mg | ORAL_TABLET | Freq: Every day | ORAL | Status: DC
  Administered 2022-01-28: 100 mg via ORAL
  Filled 2022-01-28: qty 1

## 2022-01-28 MED ORDER — SODIUM CHLORIDE 0.9 % IV BOLUS
1000.0000 mL | Freq: Once | INTRAVENOUS | Status: AC
  Administered 2022-01-28: 1000 mL via INTRAVENOUS

## 2022-01-28 MED ORDER — LORAZEPAM 2 MG PO TABS: 0.0000 mg | ORAL_TABLET | Freq: Two times a day (BID) | ORAL | Status: DC

## 2022-01-28 MED ORDER — ACETAMINOPHEN 650 MG RE SUPP: 650.0000 mg | Freq: Four times a day (QID) | RECTAL | Status: DC | PRN

## 2022-01-28 MED ORDER — LISINOPRIL 10 MG PO TABS
20.0000 mg | ORAL_TABLET | Freq: Once | ORAL | Status: AC
  Administered 2022-01-28: 20 mg via ORAL
  Filled 2022-01-28: qty 2

## 2022-01-28 MED ORDER — MOMETASONE FURO-FORMOTEROL FUM 100-5 MCG/ACT IN AERO
2.0000 | INHALATION_SPRAY | Freq: Two times a day (BID) | RESPIRATORY_TRACT | Status: DC
  Administered 2022-01-29 – 2022-01-30 (×3): 2 via RESPIRATORY_TRACT
  Filled 2022-01-28: qty 8.8

## 2022-01-28 MED ORDER — THIAMINE HCL 100 MG PO TABS
100.0000 mg | ORAL_TABLET | Freq: Every day | ORAL | Status: DC
  Administered 2022-01-29 – 2022-01-30 (×2): 100 mg via ORAL
  Filled 2022-01-28 (×2): qty 1

## 2022-01-28 MED ORDER — CLONIDINE HCL ER 0.1 MG PO TB12
0.1000 mg | ORAL_TABLET | Freq: Once | ORAL | Status: DC
  Filled 2022-01-28: qty 1

## 2022-01-28 MED ORDER — POTASSIUM CHLORIDE 10 MEQ/100ML IV SOLN
10.0000 meq | INTRAVENOUS | Status: DC
  Administered 2022-01-28: 10 meq via INTRAVENOUS

## 2022-01-28 MED ORDER — ALBUTEROL SULFATE (2.5 MG/3ML) 0.083% IN NEBU: 3.0000 mL | INHALATION_SOLUTION | Freq: Four times a day (QID) | RESPIRATORY_TRACT | Status: DC | PRN

## 2022-01-28 MED ORDER — LABETALOL HCL 5 MG/ML IV SOLN
5.0000 mg | INTRAVENOUS | Status: DC | PRN
  Administered 2022-01-28 – 2022-01-29 (×3): 5 mg via INTRAVENOUS
  Filled 2022-01-28: qty 4

## 2022-01-28 MED ORDER — LORAZEPAM 1 MG PO TABS
1.0000 mg | ORAL_TABLET | Freq: Once | ORAL | Status: AC
Start: 2022-01-28 — End: 2022-01-28
  Administered 2022-01-28: 1 mg via ORAL
  Filled 2022-01-28: qty 1

## 2022-01-28 MED ORDER — LISINOPRIL 20 MG PO TABS
20.0000 mg | ORAL_TABLET | Freq: Every day | ORAL | Status: DC
  Administered 2022-01-29: 20 mg via ORAL
  Filled 2022-01-28: qty 1

## 2022-01-28 MED ORDER — ONDANSETRON HCL 4 MG PO TABS
4.0000 mg | ORAL_TABLET | Freq: Four times a day (QID) | ORAL | Status: AC | PRN
  Administered 2022-01-29 – 2022-01-30 (×5): 4 mg via ORAL
  Filled 2022-01-28 (×5): qty 1

## 2022-01-28 MED ORDER — LACTATED RINGERS IV SOLN: INTRAVENOUS | Status: AC

## 2022-01-28 MED ORDER — POTASSIUM CHLORIDE 10 MEQ/100ML IV SOLN
10.0000 meq | INTRAVENOUS | Status: AC
  Administered 2022-01-28 (×2): 10 meq via INTRAVENOUS
  Filled 2022-01-28 (×2): qty 100

## 2022-01-28 MED ORDER — FOLIC ACID 1 MG PO TABS
1.0000 mg | ORAL_TABLET | Freq: Every day | ORAL | Status: DC
  Administered 2022-01-28 – 2022-01-30 (×3): 1 mg via ORAL
  Filled 2022-01-28 (×3): qty 1

## 2022-01-28 MED ORDER — THIAMINE HCL 100 MG/ML IJ SOLN
100.0000 mg | Freq: Every day | INTRAMUSCULAR | Status: DC
Start: 2022-01-28 — End: 2022-01-30
  Administered 2022-01-28: 100 mg via INTRAVENOUS
  Filled 2022-01-28: qty 2

## 2022-01-28 MED ORDER — LORAZEPAM 2 MG/ML IJ SOLN: 1.0000 mg | Freq: Once | INTRAMUSCULAR | Status: DC

## 2022-01-28 MED ORDER — HYDRALAZINE HCL 25 MG PO TABS
25.0000 mg | ORAL_TABLET | Freq: Three times a day (TID) | ORAL | Status: DC
  Administered 2022-01-28 – 2022-01-29 (×5): 25 mg via ORAL
  Filled 2022-01-28 (×5): qty 1

## 2022-01-28 MED ORDER — ADULT MULTIVITAMIN W/MINERALS CH
1.0000 | ORAL_TABLET | Freq: Every day | ORAL | Status: DC
  Administered 2022-01-28 – 2022-01-30 (×3): 1 via ORAL
  Filled 2022-01-28 (×3): qty 1

## 2022-01-28 MED ORDER — ONDANSETRON HCL 4 MG/2ML IJ SOLN: 4.0000 mg | Freq: Four times a day (QID) | INTRAMUSCULAR | Status: AC | PRN

## 2022-01-28 MED ORDER — ENOXAPARIN SODIUM 40 MG/0.4ML IJ SOSY
40.0000 mg | PREFILLED_SYRINGE | INTRAMUSCULAR | Status: DC
  Filled 2022-01-28 (×2): qty 0.4

## 2022-01-28 MED ORDER — NICOTINE 14 MG/24HR TD PT24
14.0000 mg | MEDICATED_PATCH | Freq: Every day | TRANSDERMAL | Status: DC | PRN
  Administered 2022-01-29: 14 mg via TRANSDERMAL
  Filled 2022-01-28: qty 1

## 2022-01-28 MED ORDER — BENZONATATE 100 MG PO CAPS
100.0000 mg | ORAL_CAPSULE | Freq: Three times a day (TID) | ORAL | Status: DC | PRN
  Administered 2022-01-29: 100 mg via ORAL
  Filled 2022-01-28: qty 1

## 2022-01-28 MED ORDER — HYDRALAZINE HCL 50 MG PO TABS
25.0000 mg | ORAL_TABLET | Freq: Three times a day (TID) | ORAL | Status: DC
  Administered 2022-01-28: 25 mg via ORAL
  Filled 2022-01-28: qty 1

## 2022-01-28 MED ORDER — LORAZEPAM 2 MG/ML IJ SOLN
0.0000 mg | Freq: Four times a day (QID) | INTRAMUSCULAR | Status: DC
  Administered 2022-01-28: 2 mg via INTRAVENOUS
  Filled 2022-01-28 (×2): qty 1

## 2022-01-28 NOTE — ED Provider Notes (Signed)
Patient becoming increasingly more anxious tachycardic now having nausea and vomiting.  Given her hypokalemia I do suspect that she she is going into acute alcohol withdrawal despite being given anxiolysis as well as Librium.  She is on CIWA protocol.  Receiving IV fluids and antiemetics.  I will consult hospitalist for admission.   Willy Eddy, MD 01/28/22 [phone]

## 2022-01-28 NOTE — ED Notes (Signed)
Patient has had 2 episodes of diarrhea

## 2022-01-28 NOTE — Assessment & Plan Note (Signed)
-  Nicotine patch 

## 2022-01-28 NOTE — Assessment & Plan Note (Addendum)
With alcohol abuse - multivitamin, thiamine, folate supplementations ordered - Check phosphorus  - Dietary has been consulted
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
## 2022-01-28 NOTE — Assessment & Plan Note (Signed)
-   Check GI panel and C. difficile PCR, if negative and anti-diarrheal agent may be started
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
## 2022-01-28 NOTE — ED Notes (Signed)
Pt assisted to bathroom and back to bed 

## 2022-01-28 NOTE — Assessment & Plan Note (Addendum)
-   Resumed home lisinopril 20 mg daily, hydralazine 25 mg p.o. 3 times daily - Ordered clonidine 0.1 mg patch, one-time dose (weekly patch) - Labetalol 5 mg IV every 3 hours as needed for SBP greater than 175, 4 doses ordered

## 2022-01-28 NOTE — Assessment & Plan Note (Signed)
-   CIWA precaution 

## 2022-01-28 NOTE — Assessment & Plan Note (Signed)
-   Resumed home Seroquel 100 mg p.o. nightly

## 2022-01-28 NOTE — Assessment & Plan Note (Signed)
Alcohol intoxication - Continue CIWA precaution - Admit to progressive cardiac, observation
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
## 2022-01-28 NOTE — Assessment & Plan Note (Signed)
-   Presumed secondary to alcohol abuse and poor nutrition - Status post potassium chloride 60 mill equivalent p.o. one-time dose and potassium chloride 10 mill equivalent IV, 2 doses - Ordered additional potassium chloride 10 mill equivalents IV, 2 doses ordered

## 2022-01-28 NOTE — ED Notes (Signed)
Pt projectile vomited across room and is having large amounts of diarrhea.

## 2022-01-28 NOTE — ED Notes (Signed)
Pt called out stating he had a stool sample.  Stool in hat noted to be brown and solid.  Pt sts "it was black and liquid.  I guess it ran it's course."

## 2022-01-29 ENCOUNTER — Encounter: Payer: Self-pay | Admitting: Internal Medicine

## 2022-01-29 DIAGNOSIS — R7401 Elevation of levels of liver transaminase levels: Secondary | ICD-10-CM

## 2022-01-29 DIAGNOSIS — F10939 Alcohol use, unspecified with withdrawal, unspecified: Secondary | ICD-10-CM

## 2022-01-29 DIAGNOSIS — Z8659 Personal history of other mental and behavioral disorders: Secondary | ICD-10-CM

## 2022-01-29 LAB — CBC
HCT: 39 % (ref 36.0–46.0)
Hemoglobin: 13.8 g/dL (ref 12.0–15.0)
MCH: 36 pg — ABNORMAL HIGH (ref 26.0–34.0)
MCHC: 35.4 g/dL (ref 30.0–36.0)
MCV: 101.8 fL — ABNORMAL HIGH (ref 80.0–100.0)
Platelets: 95 10*3/uL — ABNORMAL LOW (ref 150–400)
RBC: 3.83 MIL/uL — ABNORMAL LOW (ref 3.87–5.11)
RDW: 11.5 % (ref 11.5–15.5)
WBC: 4.9 10*3/uL (ref 4.0–10.5)
nRBC: 0 % (ref 0.0–0.2)

## 2022-01-29 LAB — BASIC METABOLIC PANEL
Anion gap: 11 (ref 5–15)
BUN: 13 mg/dL (ref 6–20)
CO2: 26 mmol/L (ref 22–32)
Calcium: 9.5 mg/dL (ref 8.9–10.3)
Chloride: 101 mmol/L (ref 98–111)
Creatinine, Ser: 0.84 mg/dL (ref 0.44–1.00)
GFR, Estimated: >60 mL/min (ref >60)
Glucose, Bld: 106 mg/dL — ABNORMAL HIGH (ref 70–99)
Potassium: 3.6 mmol/L (ref 3.5–5.1)
Sodium: 138 mmol/L (ref 135–145)

## 2022-01-29 LAB — SARS CORONAVIRUS 2 BY RT PCR: SARS Coronavirus 2 by RT PCR: NEGATIVE

## 2022-01-29 MED ORDER — ENSURE ENLIVE PO LIQD
237.0000 mL | Freq: Three times a day (TID) | ORAL | Status: DC
  Administered 2022-01-29 (×2): 237 mL via ORAL

## 2022-01-29 MED ORDER — MELATONIN 5 MG PO TABS
5.0000 mg | ORAL_TABLET | Freq: Once | ORAL | Status: AC
  Administered 2022-01-29: 5 mg via ORAL
  Filled 2022-01-29: qty 1

## 2022-01-29 MED ORDER — ALUM & MAG HYDROXIDE-SIMETH 200-200-20 MG/5ML PO SUSP
30.0000 mL | ORAL | Status: DC | PRN
  Administered 2022-01-29 – 2022-01-30 (×2): 30 mL via ORAL
  Filled 2022-01-29 (×2): qty 30

## 2022-01-29 MED ORDER — LABETALOL HCL 5 MG/ML IV SOLN
5.0000 mg | INTRAVENOUS | Status: DC | PRN
  Administered 2022-01-29 – 2022-01-30 (×3): 5 mg via INTRAVENOUS
  Filled 2022-01-29 (×3): qty 4

## 2022-01-29 MED ORDER — ORAL CARE MOUTH RINSE: 15.0000 mL | OROMUCOSAL | Status: DC | PRN

## 2022-01-29 MED ORDER — SODIUM CHLORIDE 0.9 % IV SOLN
12.5000 mg | Freq: Four times a day (QID) | INTRAVENOUS | Status: DC | PRN
  Administered 2022-01-29 – 2022-01-30 (×3): 12.5 mg via INTRAVENOUS
  Filled 2022-01-29: qty 12.5
  Filled 2022-01-29: qty 0.5
  Filled 2022-01-29: qty 12.5

## 2022-01-29 MED ORDER — HYDRALAZINE HCL 20 MG/ML IJ SOLN
5.0000 mg | Freq: Once | INTRAMUSCULAR | Status: AC
  Administered 2022-01-29: 5 mg via INTRAVENOUS
  Filled 2022-01-29: qty 1

## 2022-01-29 NOTE — Progress Notes (Signed)
       CROSS COVER NOTE  NAME: Nicole [name] MRN: [id number] DOB : 1961-02-13    Date of Service   01/29/22  HPI/Events of Note   Secure chat received from nursing reporting ongoing diarrhea, ongoing elevated BP currently 170/118, ongoing nausea, and 3/10 abdominal pain/indigestion. No ordered PRNs have been given at this time.  On bedside evaluation patient reports she has experienced headaches intermittently for months. Currently she describes 5/10 aching pain of (L) forehead above her eye. She reports she normally takes OTC pepto bismol for indigestion with relief.    Interventions   Plan:  Hypertension PRN labatelol instructions modified to include DBP >110. Continue lisinpril and hydralazine, titrate as needed Clonidine patch placed 01/28/22  Headache Continue PRN acetaminophen  Nausea and Diarrhea Continue PRN zofran and phenergan  GERD PRN Pepto Bismol ordered Scheduled Protonix ordered       This document was prepared using Dragon voice recognition software and may include unintentional dictation errors.  Bishop Limbo DNP, MHA, FNP-BC Nurse Practitioner [facility] Pager [phone]

## 2022-01-29 NOTE — Progress Notes (Signed)
       CROSS COVER NOTE  NAME: Nicole [name] MRN: [id number] DOB : [birth year]    Date of Service   01/29/2022  HPI/Events of Note   Notified by nursing of BP 201/127 and not responsive to IV labetalol. Received 2 doses tonight and administering third dose now. CIWA 6.  Interventions   Plan: Reasses BP in 1 hour IV hydralazine x1    This document was prepared using Dragon voice recognition software and may include unintentional dictation errors.  Bishop Limbo DNP, MHA, FNP-BC Nurse Practitioner [facility] Pager [phone]

## 2022-01-29 NOTE — Progress Notes (Signed)
PROGRESS NOTE    Nicole [name]  HUT:[id number] DOB: 10/03/1960 DOA: 01/28/2022 PCP: Rolm Gala, NP   Assessment & Plan:   Principal Problem:   Alcohol withdrawal (HCC) Active Problems:   Anxiety state   Korsakoff's psychosis, alcohol related (HCC)   Hypertension   Nicotine dependence   Protein-calorie malnutrition, severe   History of bipolar disorder   History of COPD   Tachycardia   Hypokalemia   Diarrhea  Assessment and Plan: Alcohol withdrawal: ethanol level 482. Alcohol cessation counseling. Continue on CIWA protocol  Transaminitis: likely secondary to alcohol abuse. Will continue to monitor    Anxiety: severity unknown. Continue on CIWA protocol    Diarrhea: GI PCR panel & c. diff ordered   Hypokalemia: WNL today    Hx of bipolar disorder: continue on home dose of seroquel   Severe protein-calorie malnutrition: continue on nutritional supplements   Nicotine dependence: smoking cessation counseling x 5 mins. Nicotine patch to prevent w/drawl   HTN: continue on lisinopril, hydralazine. IV labetalol prn        DVT prophylaxis: lovenox  Code Status: full  Family Communication:  Disposition Plan: d/c home vs jail (reported to have broken into a gas station convenience store to steal alcohol and per law enforcement drink for Loco bottle before they could stop her.)  Level of care: Progressive  Status is: Inpatient Remains inpatient appropriate because: severity of illness     Consultants:    Procedures:  Antimicrobials:    Subjective: Pt c/o nausea   Objective: Vitals:   01/29/22 0521 01/29/22 0522 01/29/22 0611 01/29/22 0653  BP: (!) 176/124 (!) 172/107 (!) 162/113   Pulse: (!) 103 (!) 103 (!) 108   Resp: (!) 21 20 18    Temp: 99 F (37.2 C)  99 F (37.2 C)   TempSrc: Oral     SpO2: 98%  99%   Weight:    46.6 kg  Height:    5\' 3"  (1.6 m)   No intake or output data in the 24 hours ending 01/29/22 0747 Filed Weights    01/28/22 1255 01/29/22 0653  Weight: 54.4 kg 46.6 kg    Examination:  General exam: Appears calm and comfortable  Respiratory system: Clear to auscultation. Respiratory effort normal. Cardiovascular system: S1 & S2+. No rubs, gallops or clicks. Gastrointestinal system: Abdomen is nondistended, soft and nontender.Normal bowel sounds heard. Central nervous system: Alert and oriented. Moves all extremities  Psychiatry: Judgement and insight appears at baseline. Flat mood and affect      Data Reviewed: I have personally reviewed following labs and imaging studies  CBC: Recent Labs  Lab 01/28/22 0551 01/29/22 0615  WBC 4.9 4.9  HGB 14.9 13.8  HCT 42.9 39.0  MCV 103.9* 101.8*  PLT 125* 95*   Basic Metabolic Panel: Recent Labs  Lab 01/28/22 0551 01/28/22 1326 01/29/22 0615  NA 142  --  138  K 2.6* 4.6 3.6  CL 109  --  101  CO2 20*  --  26  GLUCOSE 169*  --  106*  BUN 10  --  13  CREATININE 0.95  --  0.84  CALCIUM 8.6*  --  9.5  MG 1.7  --   --   PHOS  --  2.5  --    GFR: Estimated Creatinine Clearance: 53 mL/min (by C-G formula based on SCr of 0.84 mg/dL). Liver Function Tests: Recent Labs  Lab 01/28/22 0551  AST 187*  ALT 88*  ALKPHOS 86  BILITOT 0.7  PROT 7.9  ALBUMIN 3.6   No results for input(s): "LIPASE", "AMYLASE" in the last 168 hours. No results for input(s): "AMMONIA" in the last 168 hours. Coagulation Profile: No results for input(s): "INR", "PROTIME" in the last 168 hours. Cardiac Enzymes: No results for input(s): "CKTOTAL", "CKMB", "CKMBINDEX", "TROPONINI" in the last 168 hours. BNP (last 3 results) No results for input(s): "PROBNP" in the last 8760 hours. HbA1C: No results for input(s): "HGBA1C" in the last 72 hours. CBG: No results for input(s): "GLUCAP" in the last 168 hours. Lipid Profile: No results for input(s): "CHOL", "HDL", "LDLCALC", "TRIG", "CHOLHDL", "LDLDIRECT" in the last 72 hours. Thyroid Function Tests: No results for  input(s): "TSH", "T4TOTAL", "FREET4", "T3FREE", "THYROIDAB" in the last 72 hours. Anemia Panel: No results for input(s): "VITAMINB12", "FOLATE", "FERRITIN", "TIBC", "IRON", "RETICCTPCT" in the last 72 hours. Sepsis Labs: No results for input(s): "PROCALCITON", "LATICACIDVEN" in the last 168 hours.  Recent Results (from the past 240 hour(s))  SARS Coronavirus 2 by RT PCR (hospital order, performed in [facility] hospital lab) *cepheid single result test* Anterior Nasal Swab     Status: None   Collection Time: 01/29/22  1:28 AM   Specimen: Anterior Nasal Swab  Result Value Ref Range Status   SARS Coronavirus 2 by RT PCR NEGATIVE NEGATIVE Final    Comment: (NOTE) SARS-CoV-2 target nucleic acids are NOT DETECTED.  The SARS-CoV-2 RNA is generally detectable in upper and lower respiratory specimens during the acute phase of infection. The lowest concentration of SARS-CoV-2 viral copies this assay can detect is 250 copies / mL. A negative result does not preclude SARS-CoV-2 infection and should not be used as the sole basis for treatment or other patient management decisions.  A negative result may occur with improper specimen collection / handling, submission of specimen other than nasopharyngeal swab, presence of viral mutation(s) within the areas targeted by this assay, and inadequate number of viral copies (<250 copies / mL). A negative result must be combined with clinical observations, patient history, and epidemiological information.  Fact Sheet for Patients:   RoadLapTop.co.za  Fact Sheet for Healthcare Providers: http://kim-miller.com/  This test is not yet approved or  cleared by the [place] FDA and has been authorized for detection and/or diagnosis of SARS-CoV-2 by FDA under an Emergency Use Authorization (EUA).  This EUA will remain in effect (meaning this test can be used) for the duration of the COVID-19 declaration under  Section 564(b)(1) of the Act, 21 U.S.C. section 360bbb-3(b)(1), unless the authorization is terminated or revoked sooner.  Performed at [facility], [address] [place], [place] [address]          Radiology Studies: DG Chest Portable 1 View  Result Date: 01/28/2022 CLINICAL DATA:  61 year old female with history of cough. Evaluate for aspiration. EXAM: PORTABLE CHEST 1 VIEW COMPARISON:  Chest x-ray 12/14/2021. FINDINGS: Lung volumes are low. Diffuse interstitial prominence and peribronchial cuffing, with some bibasilar architectural distortion (right greater than left), stable compared to the prior study. No consolidative airspace disease. No pleural effusions. No pneumothorax. No pulmonary nodule or mass noted. Pulmonary vasculature and the cardiomediastinal silhouette are within normal limits. Atherosclerotic calcifications are noted in the thoracic aorta. IMPRESSION: 1. No radiographic evidence of acute cardiopulmonary disease. 2. Chronic findings in the lungs concerning for potential interstitial lung disease. Outpatient referral to Pulmonology for further clinical evaluation is recommended. Follow-up nonemergent high-resolution chest CT should be considered if clinically appropriate. 3. Aortic atherosclerosis. Electronically Signed  By: Trudie Reed M.D.   On: 01/28/2022 08:47        Scheduled Meds:  cloNIDine  0.1 mg Transdermal Weekly   enoxaparin (LOVENOX) injection  40 mg Subcutaneous Q24H   folic acid  1 mg Oral Daily   hydrALAZINE  25 mg Oral TID   lisinopril  20 mg Oral Daily   LORazepam  0-4 mg Intravenous Q6H   Or   LORazepam  0-4 mg Oral Q6H   LORazepam  1 mg Intravenous Once   mometasone-formoterol  2 puff Inhalation BID   multivitamin with minerals  1 tablet Oral Daily   QUEtiapine  100 mg Oral QHS   thiamine  100 mg Oral Daily   Or   thiamine  100 mg Intravenous Daily   Continuous Infusions:  lactated ringers 100 mL/hr at 01/29/22 0353      LOS: 0 days    Time spent: 35 mins     Charise Killian, MD [facility] Pager [phone]  If 7PM-7AM, please contact night-coverage [email or web address] 01/29/2022, 7:47 AM

## 2022-01-29 NOTE — Progress Notes (Signed)
Initial Nutrition Assessment  DOCUMENTATION CODES:   Severe malnutrition in context of chronic illness, Underweight  INTERVENTION:   -Ensure Enlive po TID, each supplement provides 350 kcal and 20 grams of protein -MVI with minerals daily -Liberalize diet to regular for widest variety of meal selections  NUTRITION DIAGNOSIS:   Severe Malnutrition related to chronic illness (ETOH abuse) as evidenced by moderate muscle depletion, severe muscle depletion, moderate fat depletion, severe fat depletion, percent weight loss.  GOAL:   Patient will meet greater than or equal to 90% of their needs  MONITOR:   PO intake, Supplement acceptance  REASON FOR ASSESSMENT:   Consult, Malnutrition Screening Tool Assessment of nutrition requirement/status  ASSESSMENT:   Pt with history of alcohol abuse and dependence, hypertension, bipolar disorder, COPD, asthma, anxiety, history of hepatitis C, substance use disorder, who presents for chief concerns of alcohol intoxication.  Pt admitted with alcohol withdrawal.   Reviewed I/O's: +1.4 L x 24 hours  Spoke with pt at bedside, who reports feeling unwell today. She shares her appetite is poor secondary to nausea- she consumed a few bites of eggs and sausage this morning.    Per pt, she shares that she eats very little PTA, usually one meal per day if she is lucky. Typical alcohol intake is a pint of whiskey and one wine cooler daily.   Pt shares that she has always been small framed and UBVW is around 109#. She estimates she has lost 7 pounds over the past month. Reviewed wt hx; pt has experienced a 9.9% wt loss over the past 3 months, which is significant for time frame.   Discussed importance of good meal and supplement intake to promote healing. Pt amenable to supplements. RD also obtained lunch order and entered into system.   Medications reviewed and include folic acid, ativan, and thiamine.   Labs reviewed.   NUTRITION - FOCUSED  PHYSICAL EXAM:  Flowsheet Row Most Recent Value  Orbital Region Moderate depletion  Upper Arm Region Severe depletion  Thoracic and Lumbar Region Severe depletion  Buccal Region Moderate depletion  Temple Region Severe depletion  Clavicle Bone Region Severe depletion  Clavicle and Acromion Bone Region Severe depletion  Scapular Bone Region Severe depletion  Dorsal Hand Moderate depletion  Patellar Region Severe depletion  Anterior Thigh Region Severe depletion  Posterior Calf Region Severe depletion  Edema (RD Assessment) None  Hair Reviewed  Eyes Reviewed  Mouth Reviewed  Skin Reviewed  Nails Reviewed       Diet Order:   Diet Order             Diet regular Room service appropriate? Yes; Fluid consistency: Thin  Diet effective now                   EDUCATION NEEDS:   Education needs have been addressed  Skin:  Skin Assessment: Reviewed RN Assessment  Last BM:  01/28/22 (type 6)  Height:   Ht Readings from Last 1 Encounters:  01/29/22 5\' 3"  (1.6 m)    Weight:   Wt Readings from Last 1 Encounters:  01/29/22 46.6 kg    Ideal Body Weight:  52.3 kg  BMI:  Body mass index is 18.2 kg/m.  Estimated Nutritional Needs:   Kcal:  1850-2050  Protein:  90-105 grams  Fluid:  > 1.8 L    01/31/22, RD, LDN, CDCES Registered Dietitian II Certified Diabetes Care and Education Specialist Please refer to [facility] for RD and/or RD on-call/weekend/after hours  pager

## 2022-01-30 ENCOUNTER — Other Ambulatory Visit: Payer: Self-pay

## 2022-01-30 DIAGNOSIS — I10 Essential (primary) hypertension: Secondary | ICD-10-CM

## 2022-01-30 LAB — CBC
HCT: 40.6 % (ref 36.0–46.0)
Hemoglobin: 14.5 g/dL (ref 12.0–15.0)
MCH: 36.6 pg — ABNORMAL HIGH (ref 26.0–34.0)
MCHC: 35.7 g/dL (ref 30.0–36.0)
MCV: 102.5 fL — ABNORMAL HIGH (ref 80.0–100.0)
Platelets: 95 10*3/uL — ABNORMAL LOW (ref 150–400)
RBC: 3.96 MIL/uL (ref 3.87–5.11)
RDW: 11.3 % — ABNORMAL LOW (ref 11.5–15.5)
WBC: 4.9 10*3/uL (ref 4.0–10.5)
nRBC: 0 % (ref 0.0–0.2)

## 2022-01-30 LAB — COMPREHENSIVE METABOLIC PANEL
ALT: 87 U/L — ABNORMAL HIGH (ref 0–44)
AST: 167 U/L — ABNORMAL HIGH (ref 15–41)
Albumin: 3.4 g/dL — ABNORMAL LOW (ref 3.5–5.0)
Alkaline Phosphatase: 103 U/L (ref 38–126)
Anion gap: 8 (ref 5–15)
BUN: 20 mg/dL (ref 6–20)
CO2: 27 mmol/L (ref 22–32)
Calcium: 10 mg/dL (ref 8.9–10.3)
Chloride: 101 mmol/L (ref 98–111)
Creatinine, Ser: 1.03 mg/dL — ABNORMAL HIGH (ref 0.44–1.00)
GFR, Estimated: >60 mL/min (ref >60)
Glucose, Bld: 137 mg/dL — ABNORMAL HIGH (ref 70–99)
Potassium: 3.7 mmol/L (ref 3.5–5.1)
Sodium: 136 mmol/L (ref 135–145)
Total Bilirubin: 1.8 mg/dL — ABNORMAL HIGH (ref 0.3–1.2)
Total Protein: 7.5 g/dL (ref 6.5–8.1)

## 2022-01-30 MED ORDER — BISMUTH SUBSALICYLATE 262 MG/15ML PO SUSP: 30.0000 mL | ORAL | Status: DC | PRN

## 2022-01-30 MED ORDER — LORAZEPAM 2 MG PO TABS
0.0000 mg | ORAL_TABLET | Freq: Four times a day (QID) | ORAL | Status: DC
  Administered 2022-01-30: 1 mg via ORAL
  Filled 2022-01-30: qty 1

## 2022-01-30 MED ORDER — PANTOPRAZOLE SODIUM 40 MG PO TBEC
40.0000 mg | DELAYED_RELEASE_TABLET | Freq: Every day | ORAL | Status: DC
  Administered 2022-01-30: 40 mg via ORAL
  Filled 2022-01-30: qty 1

## 2022-01-30 MED ORDER — LISINOPRIL 20 MG PO TABS
40.0000 mg | ORAL_TABLET | Freq: Every day | ORAL | Status: DC
  Administered 2022-01-30: 40 mg via ORAL
  Filled 2022-01-30: qty 2

## 2022-01-30 MED ORDER — LORAZEPAM 2 MG/ML IJ SOLN: 0.0000 mg | Freq: Four times a day (QID) | INTRAMUSCULAR | Status: DC

## 2022-01-30 MED ORDER — QUETIAPINE FUMARATE 100 MG PO TABS
100.0000 mg | ORAL_TABLET | Freq: Every day | ORAL | 0 refills | Status: DC
  Filled 2022-01-30: qty 30, 30d supply, fill #0

## 2022-01-30 MED ORDER — LISINOPRIL 40 MG PO TABS
40.0000 mg | ORAL_TABLET | Freq: Every day | ORAL | 0 refills | Status: DC
  Filled 2022-01-30: qty 30, 30d supply, fill #0

## 2022-01-30 MED ORDER — HYDRALAZINE HCL 50 MG PO TABS
50.0000 mg | ORAL_TABLET | Freq: Three times a day (TID) | ORAL | 0 refills | Status: DC
  Filled 2022-01-30: qty 90, 30d supply, fill #0

## 2022-01-30 MED ORDER — HYDRALAZINE HCL 50 MG PO TABS
50.0000 mg | ORAL_TABLET | Freq: Three times a day (TID) | ORAL | Status: DC
  Administered 2022-01-30: 50 mg via ORAL
  Filled 2022-01-30: qty 1

## 2022-01-30 NOTE — Discharge Summary (Addendum)
Physician Discharge Summary  Syniyah Bourne Batley MQK:[id number] DOB: [birth year] DOA: 01/28/2022  PCP: Langston Reusing, NP  Admit date: 01/28/2022 Discharge date: 01/30/2022  Admitted From: home  Disposition:  home   Recommendations for Outpatient Follow-up:  Follow up with PCP in 1-2 weeks Please obtain CMP to get liver function levels   Home Health: no  Equipment/Devices:   Discharge Condition: stable  CODE STATUS: full  Diet recommendation: Heart Healthy  Brief/Interim Summary: HPI was taken from Dr. Tobie Poet: Ms. Nicole [name] is a 61 year old female with history of alcohol abuse and dependence, hypertension, bipolar disorder, COPD, asthma, anxiety, history of hepatitis C, substance use disorder, who presents emergency department for chief concerns of alcohol intoxication.   Patient was taken to the emergency department by EMS at the request of law enforcement.  Patient was reported to have broken into a gas station convenience store to steal alcohol and per law enforcement drink for [place] bottle before they could stop her.   Initial vitals in the emergency department showed temperature of 98.2, respiration rate initially was 20, heart rate of 101 now has increased to 121, initial blood pressure 140/89, now 160/109.  SPO2 was 98% on room air.  Serum sodium is 142, potassium 2.6, chloride 109, bicarb 20, BUN of 10, serum creatinine of 0.95, GFR greater than 60, nonfasting blood glucose 169, WBC 6.6, hemoglobin 14.9, platelets of 125.   ED treatment: EDP ordered lisinopril 20 mg p.o. one-time dose, CIWA protocol, Librium 25 mg p.o. one-time dose, Ativan 1 mg p.o. one-time dose, potassium chloride 10 mill equivalent IV over 60 minutes, 2 doses ordered, potassium chloride 40 mg 1 p.o. one-time dose, and patient received 3 L of sodium chloride bolus.   At bedside patient was able to tell me her name, her age.  She knows she is in the hospital.   She denies chest pain, shortness of  breath.  She states that she has to urinate.  She states that she did not break into the convenience store, she states that she would never do that.  She reports the convenience or was open 24 hours/day.  As per Dr. Jimmye Norman 7/31-01/30/22: Pt was treated for alcohol w/drawl w/ CIWA protocol. Pt did receive alcohol cessation counseling while inpatient. Pt was found to have transaminitis likely secondary to alcohol abuse. Pt will need to get repeat CMP as an outpatient f/u on liver function levels. Pt verbalized her understanding. Also, pt was found to have poorly controlled HTN so pt's home dose of lisinopril was increased to 85m daily and hydralazine increased to 522mTID.  Discharge Diagnoses:  Principal Problem:   Alcohol withdrawal ([facility] Problems:   Anxiety state   Korsakoff's psychosis, alcohol related ([facility] [place]  Hypertension   Nicotine dependence   Protein-calorie malnutrition, severe   History of bipolar disorder   History of COPD   Tachycardia   Hypokalemia   Diarrhea  Alcohol withdrawal: ethanol level 482. Alcohol cessation counseling. Continue on CIWA protocol  Transaminitis: likely secondary to alcohol abuse. Will continue to monitor    Anxiety: severity unknown. Continue on CIWA protocol    Diarrhea: GI PCR panel & c. diff ordered but not collected    Hypokalemia: WNL today    Hx of bipolar disorder: continue on home dose of seroquel   Severe protein-calorie malnutrition: continue on nutritional supplements   Nicotine dependence: smoking cessation counseling x 5 mins. Nicotine patch to prevent w/drawl   HTN urgency: continue on lisinopril, hydralazine. IV  labetalol prn   Discharge Instructions  Discharge Instructions     Diet - low sodium heart healthy   Complete by: As directed    Discharge instructions   Complete by: As directed    F/u w/ PCP in 1-2 weeks. Need get a repeat CMP to check your liver function   Increase activity slowly   Complete by: As  directed       Allergies as of 01/30/2022   No Known Allergies      Medication List     STOP taking these medications    benzonatate 100 MG capsule Commonly known as: Tessalon Perles       TAKE these medications    albuterol 108 (90 Base) MCG/ACT inhaler Commonly known as: Proventil HFA Inhale 1-2 puffs into the lungs once every 6 (six) hours as needed for wheezing or shortness of breath.   Blood Pressure Kit USE AS DIRECTED TWICE DAILY.   buprenorphine-naloxone 8-2 mg Subl SL tablet Commonly known as: SUBOXONE Place 1 tablet under the tongue daily.   guaiFENesin 100 MG/5ML liquid Commonly known as: ROBITUSSIN Take 5 mLs (100 mg total) by mouth every 4 (four) hours as needed for cough or to loosen phlegm. Take during the day   hydrALAZINE 50 MG tablet Commonly known as: APRESOLINE Take 1 tablet (50 mg total) by mouth 3 (three) times daily. What changed:  medication strength how much to take   lisinopril 40 MG tablet Commonly known as: ZESTRIL Take 1 tablet (40 mg total) by mouth daily. Start taking on: January 31, 2022 What changed:  medication strength how much to take   QUEtiapine 100 MG tablet Commonly known as: SEROQUEL Take 1 tablet (100 mg total) by mouth once nightly at bedtime.   Wixela Inhub 100-50 MCG/ACT Aepb Generic drug: fluticasone-salmeterol Inhale 1 puff into the lungs 2 (two) times daily.        Follow-up Information     Iloabachie, Chioma E, NP. Go in 3 day(s).   Specialty: Gerontology Why: Appointment on Thursday, 02/01/2022 at 3:30pm. Contact information: [address] [place] [address] [phone]                No Known Allergies  Consultations:    Procedures/Studies: DG Chest Portable 1 View  Result Date: 01/28/2022 CLINICAL DATA:  61-year-old female with history of cough. Evaluate for aspiration. EXAM: PORTABLE CHEST 1 VIEW COMPARISON:  Chest x-ray 12/14/2021. FINDINGS: Lung volumes are low.  Diffuse interstitial prominence and peribronchial cuffing, with some bibasilar architectural distortion (right greater than left), stable compared to the prior study. No consolidative airspace disease. No pleural effusions. No pneumothorax. No pulmonary nodule or mass noted. Pulmonary vasculature and the cardiomediastinal silhouette are within normal limits. Atherosclerotic calcifications are noted in the thoracic aorta. IMPRESSION: 1. No radiographic evidence of acute cardiopulmonary disease. 2. Chronic findings in the lungs concerning for potential interstitial lung disease. Outpatient referral to Pulmonology for further clinical evaluation is recommended. Follow-up nonemergent high-resolution chest CT should be considered if clinically appropriate. 3. Aortic atherosclerosis. Electronically Signed   By: Daniel  Entrikin M.D.   On: 01/28/2022 08:47   (Echo, Carotid, EGD, Colonoscopy, ERCP)    Subjective: Pt denies any complaints    Discharge Exam: Vitals:   01/30/22 0803 01/30/22 1103  BP: (!) 163/111 (!) 137/101  Pulse: (!) 111 (!) 115  Resp: 17 16  Temp: 98.2 F (36.8 C) 98 F (36.7 C)  SpO2: 100% 100%   Vitals:     01/30/22 0443 01/30/22 0556 01/30/22 0803 01/30/22 1103  BP: (!) 171/117 (!) 159/120 (!) 163/111 (!) 137/101  Pulse: (!) 113 98 (!) 111 (!) 115  Resp: _0 Temp: 97.6 F (36.4 C)  98.2 F (36.8 C) 98 F (36.7 C)  TempSrc: Oral     SpO2: 100%  100% 100%  Weight:      Height:        General: Pt is alert, awake, not in acute distress. Frail appearing  Cardiovascular: S1/S2 +, no rubs, no gallops Respiratory: CTA bilaterally, no wheezing, no rhonchi Abdominal: Soft, NT, ND, bowel sounds + Extremities: no edema, no cyanosis    The results of significant diagnostics from this hospitalization (including imaging, microbiology, ancillary and laboratory) are listed below for reference.     Microbiology: Recent Results (from the past 240 hour(s))  SARS  Coronavirus 2 by RT PCR (hospital order, performed in [facility] hospital lab) *cepheid single result test* Anterior Nasal Swab     Status: None   Collection Time: 01/29/22  1:28 AM   Specimen: Anterior Nasal Swab  Result Value Ref Range Status   SARS Coronavirus 2 by RT PCR NEGATIVE NEGATIVE Final    Comment: (NOTE) SARS-CoV-2 target nucleic acids are NOT DETECTED.  The SARS-CoV-2 RNA is generally detectable in upper and lower respiratory specimens during the acute phase of infection. The lowest concentration of SARS-CoV-2 viral copies this assay can detect is 250 copies / mL. A negative result does not preclude SARS-CoV-2 infection and should not be used as the sole basis for treatment or other patient management decisions.  A negative result may occur with improper specimen collection / handling, submission of specimen other than nasopharyngeal swab, presence of viral mutation(s) within the areas targeted by this assay, and inadequate number of viral copies (<250 copies / mL). A negative result must be combined with clinical observations, patient history, and epidemiological information.  Fact Sheet for Patients:   https://www.patel.info/  Fact Sheet for Healthcare Providers: https://hall.com/  This test is not yet approved or  cleared by the [place] FDA and has been authorized for detection and/or diagnosis of SARS-CoV-2 by FDA under an Emergency Use Authorization (EUA).  This EUA will remain in effect (meaning this test can be used) for the duration of the COVID-19 declaration under Section 564(b)(1) of the Act, 21 U.S.C. section 360bbb-3(b)(1), unless the authorization is terminated or revoked sooner.  Performed at [facility], [address] [place], [place] [address]      Labs: BNP (last 3 results) No results for input(s): "BNP" in the last 8760 hours. Basic Metabolic Panel: Recent Labs  Lab  01/28/22 0551 01/28/22 1326 01/29/22 0615 01/30/22 0353  NA 142  --  138 136  K 2.6* 4.6 3.6 3.7  CL 109  --  101 101  CO2 20*  --  26 27  GLUCOSE 169*  --  106* 137*  BUN 10  --  13 20  CREATININE 0.95  --  0.84 1.03*  CALCIUM 8.6*  --  9.5 10.0  MG 1.7  --   --   --   PHOS  --  2.5  --   --    Liver Function Tests: Recent Labs  Lab 01/28/22 0551 01/30/22 0353  AST 187* 167*  ALT 88* 87*  ALKPHOS 86 103  BILITOT 0.7 1.8*  PROT 7.9 7.5  ALBUMIN 3.6 3.4*   No results for input(s): "LIPASE", "AMYLASE" in the  last 168 hours. No results for input(s): "AMMONIA" in the last 168 hours. CBC: Recent Labs  Lab 01/28/22 0551 01/29/22 0615 01/30/22 0353  WBC 4.9 4.9 4.9  HGB 14.9 13.8 14.5  HCT 42.9 39.0 40.6  MCV 103.9* 101.8* 102.5*  PLT 125* 95* 95*   Cardiac Enzymes: No results for input(s): "CKTOTAL", "CKMB", "CKMBINDEX", "TROPONINI" in the last 168 hours. BNP: Invalid input(s): "POCBNP" CBG: No results for input(s): "GLUCAP" in the last 168 hours. D-Dimer No results for input(s): "DDIMER" in the last 72 hours. Hgb A1c No results for input(s): "HGBA1C" in the last 72 hours. Lipid Profile No results for input(s): "CHOL", "HDL", "LDLCALC", "TRIG", "CHOLHDL", "LDLDIRECT" in the last 72 hours. Thyroid function studies No results for input(s): "TSH", "T4TOTAL", "T3FREE", "THYROIDAB" in the last 72 hours.  Invalid input(s): "FREET3" Anemia work up No results for input(s): "VITAMINB12", "FOLATE", "FERRITIN", "TIBC", "IRON", "RETICCTPCT" in the last 72 hours. Urinalysis    Component Value Date/Time   COLORURINE YELLOW (A) 08/31/2020 0847   APPEARANCEUR HAZY (A) 08/31/2020 0847   LABSPEC 1.021 08/31/2020 0847   PHURINE 6.0 08/31/2020 0847   GLUCOSEU NEGATIVE 08/31/2020 0847   HGBUR NEGATIVE 08/31/2020 0847   BILIRUBINUR NEGATIVE 08/31/2020 0847   KETONESUR NEGATIVE 08/31/2020 0847   PROTEINUR NEGATIVE 08/31/2020 0847   NITRITE POSITIVE (A) 08/31/2020 0847    LEUKOCYTESUR MODERATE (A) 08/31/2020 0847   Sepsis Labs Recent Labs  Lab 01/28/22 0551 01/29/22 0615 01/30/22 0353  WBC 4.9 4.9 4.9   Microbiology Recent Results (from the past 240 hour(s))  SARS Coronavirus 2 by RT PCR (hospital order, performed in [facility] hospital lab) *cepheid single result test* Anterior Nasal Swab     Status: None   Collection Time: 01/29/22  1:28 AM   Specimen: Anterior Nasal Swab  Result Value Ref Range Status   SARS Coronavirus 2 by RT PCR NEGATIVE NEGATIVE Final    Comment: (NOTE) SARS-CoV-2 target nucleic acids are NOT DETECTED.  The SARS-CoV-2 RNA is generally detectable in upper and lower respiratory specimens during the acute phase of infection. The lowest concentration of SARS-CoV-2 viral copies this assay can detect is 250 copies / mL. A negative result does not preclude SARS-CoV-2 infection and should not be used as the sole basis for treatment or other patient management decisions.  A negative result may occur with improper specimen collection / handling, submission of specimen other than nasopharyngeal swab, presence of viral mutation(s) within the areas targeted by this assay, and inadequate number of viral copies (<250 copies / mL). A negative result must be combined with clinical observations, patient history, and epidemiological information.  Fact Sheet for Patients:   https://www.fda.gov/media/158405/download  Fact Sheet for Healthcare Providers: https://www.fda.gov/media/158404/download  This test is not yet approved or  cleared by the [place] FDA and has been authorized for detection and/or diagnosis of SARS-CoV-2 by FDA under an Emergency Use Authorization (EUA).  This EUA will remain in effect (meaning this test can be used) for the duration of the COVID-19 declaration under Section 564(b)(1) of the Act, 21 U.S.C. section 360bbb-3(b)(1), unless the authorization is terminated or revoked sooner.  Performed at [facility], [address] [place], [place] [address]      Time coordinating discharge: Over 30 minutes  SIGNED:    M , MD  [facility] 01/30/2022, 12:41 PM Pager   If 7PM-7AM, please contact night-coverage [email or web address]   

## 2022-01-30 NOTE — Progress Notes (Signed)
Mobility Specialist - Progress Note    01/30/22 0919  Mobility  Activity Ambulated independently in hallway  Level of Assistance Independent  Assistive Device None  Distance Ambulated (ft) 360 ft  Activity Response Tolerated well  $Mobility charge 1 Mobility   Pt supine upon entry. Pt utilizing RA. Pt independently ambulated 2 laps around NS. Pt left supine with needs in reach. No complaints.  Zetta Bills Mobility Specialist 01/30/22 9:21 AM

## 2022-01-30 NOTE — Plan of Care (Signed)

## 2022-02-01 ENCOUNTER — Other Ambulatory Visit: Payer: Self-pay

## 2022-02-01 ENCOUNTER — Encounter: Payer: Self-pay | Admitting: Gerontology

## 2022-02-01 ENCOUNTER — Ambulatory Visit: Payer: Medicaid Other | Admitting: Gerontology

## 2022-02-01 VITALS — BP 126/83 | HR 103 | Temp 98.0°F | Resp 17 | Ht 63.0 in | Wt 105.6 lb

## 2022-02-01 DIAGNOSIS — R9389 Abnormal findings on diagnostic imaging of other specified body structures: Secondary | ICD-10-CM | POA: Insufficient documentation

## 2022-02-01 DIAGNOSIS — Z09 Encounter for follow-up examination after completed treatment for conditions other than malignant neoplasm: Secondary | ICD-10-CM

## 2022-02-01 DIAGNOSIS — Z8709 Personal history of other diseases of the respiratory system: Secondary | ICD-10-CM

## 2022-02-01 DIAGNOSIS — R000 Tachycardia, unspecified: Secondary | ICD-10-CM

## 2022-02-01 DIAGNOSIS — I10 Essential (primary) hypertension: Secondary | ICD-10-CM

## 2022-02-01 MED ORDER — HYDRALAZINE HCL 50 MG PO TABS
50.0000 mg | ORAL_TABLET | Freq: Three times a day (TID) | ORAL | 0 refills | Status: DC
  Filled 2022-02-01 – 2022-02-28 (×2): qty 90, 30d supply, fill #0

## 2022-02-01 MED ORDER — LISINOPRIL 40 MG PO TABS
40.0000 mg | ORAL_TABLET | Freq: Every day | ORAL | 0 refills | Status: DC
  Filled 2022-02-01 – 2022-02-28 (×2): qty 30, 30d supply, fill #0

## 2022-02-01 MED ORDER — MOMETASONE FURO-FORMOTEROL FUM 100-5 MCG/ACT IN AERO
2.0000 | INHALATION_SPRAY | Freq: Two times a day (BID) | RESPIRATORY_TRACT | 5 refills | Status: DC
  Filled 2022-02-01: qty 1, fill #0

## 2022-02-01 NOTE — Progress Notes (Signed)
Established Patient Office Visit  Subjective   Patient ID: Nicole [name], female    DOB: 18-Apr-1961  Age: 61 y.o. MRN: [id number]  Chief Complaint  Patient presents with   Hospitalization Follow-up    HPI Nicole [name] is a 61y/o female who has history of bipolar, COPD, hypertension, asthma, anxiety, collagen vascular disease, hepatitis C , substance use disorder, presents for follow-up after hospital discharge.  She was discharged from [facility] on 01/30/2022 and during her hospital course she was treated for alcohol intoxication.  Chest X ray done on 01/28/22 showed  Chronic findings in the lungs concerning for potential interstitial lung disease. Outpatient referral to Pulmonology for further clinical evaluation is recommended. Follow-up non emergent high-resolution chest CT should be considered if clinically appropriate. 3. Aortic atherosclerosis.currently, she states that her breathing is stable, denies wheezing, cough but continues to smoke 1 pack of cigarette daily and admits the desire to quit.  She states that her mood is good, denies suicidal no homicidal ideation.  Overall, she states that she is doing well and offers no further complaint.      Review of Systems  Constitutional: Negative.   Eyes: Negative.   Respiratory: Negative.    Cardiovascular: Negative.   Neurological: Negative.   Psychiatric/Behavioral: Negative.        Objective:     BP 126/83 (BP Location: Left Arm, Patient Position: Sitting, Cuff Size: Normal)   Pulse (!) 103   Temp 98 F (36.7 C) (Oral)   Resp 17   Ht 5' 3" (1.6 m)   Wt 105 lb 9.6 oz (47.9 kg)   SpO2 95%   BMI 18.71 kg/m  BP Readings from Last 3 Encounters:  02/01/22 126/83  01/30/22 (!) 137/101  12/12/21 (!) 175/113   Wt Readings from Last 3 Encounters:  02/01/22 105 lb 9.6 oz (47.9 kg)  01/29/22 102 lb 11.8 oz (46.6 kg)  12/12/21 109 lb (49.4 kg)      Physical Exam HENT:     Head: Normocephalic and atraumatic.      Mouth/Throat:     Mouth: Mucous membranes are moist.  Eyes:     Extraocular Movements: Extraocular movements intact.     Conjunctiva/sclera: Conjunctivae normal.     Pupils: Pupils are equal, round, and reactive to light.  Cardiovascular:     Rate and Rhythm: Tachycardia present.     Pulses: Normal pulses.     Heart sounds: Normal heart sounds.  Pulmonary:     Effort: Pulmonary effort is normal.     Breath sounds: Normal breath sounds.  Skin:    General: Skin is warm.  Neurological:     General: No focal deficit present.     Mental Status: She is alert and oriented to person, place, and time. Mental status is at baseline.  Psychiatric:        Mood and Affect: Mood normal.        Behavior: Behavior normal.        Thought Content: Thought content normal.        Judgment: Judgment normal.      No results found for any visits on 02/01/22.  Last CBC Lab Results  Component Value Date   WBC 4.9 01/30/2022   HGB 14.5 01/30/2022   HCT 40.6 01/30/2022   MCV 102.5 (H) 01/30/2022   MCH 36.6 (H) 01/30/2022   RDW 11.3 (L) 01/30/2022   PLT 95 (L) 38/25/0539   Last metabolic panel Lab Results  Component  Value Date   GLUCOSE 137 (H) 01/30/2022   NA 136 01/30/2022   K 3.7 01/30/2022   CL 101 01/30/2022   CO2 27 01/30/2022   BUN 20 01/30/2022   CREATININE 1.03 (H) 01/30/2022   GFRNONAA >60 01/30/2022   CALCIUM 10.0 01/30/2022   PHOS 2.5 01/28/2022   PROT 7.5 01/30/2022   ALBUMIN 3.4 (L) 01/30/2022   LABGLOB 3.7 10/03/2021   AGRATIO 1.0 (L) 10/03/2021   BILITOT 1.8 (H) 01/30/2022   ALKPHOS 103 01/30/2022   AST 167 (H) 01/30/2022   ALT 87 (H) 01/30/2022   ANIONGAP 8 01/30/2022   Last lipids Lab Results  Component Value Date   CHOL 151 10/03/2021   HDL 77 10/03/2021   LDLCALC 57 10/03/2021   TRIG 90 10/03/2021   CHOLHDL 2.0 10/03/2021   Last hemoglobin A1c No results found for: "HGBA1C"    The 10-year ASCVD risk score (Arnett DK, et al., 2019) is: 5.2%     Assessment & Plan:   1. Abnormal finding on imaging -Due to abnormal finding on chest x-ray she will follow-up with pulmonology. - Ambulatory referral to Pulmonology  2. Primary hypertension -Her blood pressure is improving, she will continue on current medication and will recheck her liver enzymes. - Comp Met (CMET); Future - lisinopril (ZESTRIL) 40 MG tablet; Take 1 tablet (40 mg total) by mouth daily.  Dispense: 30 tablet; Refill: 0 - hydrALAZINE (APRESOLINE) 50 MG tablet; Take 1 tablet (50 mg total) by mouth 3 (three) times daily.  Dispense: 90 tablet; Refill: 0 - Comp Met (CMET)  3. History of COPD -She will continue on current medication, encouraged on smoking cessation and was provided with [facility] quit line information. - mometasone-formoterol (DULERA) 100-5 MCG/ACT AERO; Inhale 2 puffs into the lungs 2 (two) times daily.  Dispense: 1 each; Refill: 5  4. Hospital discharge follow-up -She was advised to follow-up with her hospital discharge instructions.  5. Tachycardia -Her heart rate was 103 when rechecked, she was advised to increase water intake and monitor heart rate.  She was advised to go to the emergency room for worsening symptoms.   Return in about 5 weeks (around 03/08/2022), or if symptoms worsen or fail to improve.     Jerold Coombe, NP

## 2022-02-02 LAB — COMPREHENSIVE METABOLIC PANEL
ALT: 244 IU/L — ABNORMAL HIGH (ref 0–32)
AST: 396 IU/L — ABNORMAL HIGH (ref 0–40)
Albumin/Globulin Ratio: 1.2 (ref 1.2–2.2)
Albumin: 4 g/dL (ref 3.8–4.9)
Alkaline Phosphatase: 119 IU/L (ref 44–121)
BUN/Creatinine Ratio: 26 — ABNORMAL HIGH (ref 9–23)
BUN: 37 mg/dL — ABNORMAL HIGH (ref 6–24)
Bilirubin Total: 0.8 mg/dL (ref 0.0–1.2)
CO2: 23 mmol/L (ref 20–29)
Calcium: 11 mg/dL — ABNORMAL HIGH (ref 8.7–10.2)
Chloride: 100 mmol/L (ref 96–106)
Creatinine, Ser: 1.42 mg/dL — ABNORMAL HIGH (ref 0.57–1.00)
Globulin, Total: 3.3 g/dL (ref 1.5–4.5)
Glucose: 101 mg/dL — ABNORMAL HIGH (ref 70–99)
Potassium: 3.7 mmol/L (ref 3.5–5.2)
Sodium: 138 mmol/L (ref 134–144)
Total Protein: 7.3 g/dL (ref 6.0–8.5)
eGFR: 43 mL/min/{1.73_m2} — ABNORMAL LOW (ref >59)

## 2022-02-05 ENCOUNTER — Other Ambulatory Visit: Payer: Self-pay

## 2022-02-07 ENCOUNTER — Other Ambulatory Visit: Payer: Self-pay

## 2022-02-08 ENCOUNTER — Other Ambulatory Visit: Payer: Self-pay | Admitting: Gerontology

## 2022-02-08 ENCOUNTER — Other Ambulatory Visit: Payer: Self-pay

## 2022-02-08 DIAGNOSIS — Z8709 Personal history of other diseases of the respiratory system: Secondary | ICD-10-CM

## 2022-02-08 MED ORDER — FLUTICASONE-SALMETEROL 100-50 MCG/ACT IN AEPB
1.0000 | INHALATION_SPRAY | Freq: Two times a day (BID) | RESPIRATORY_TRACT | 3 refills | Status: DC
  Filled 2022-02-08: qty 60, 30d supply, fill #0

## 2022-02-14 ENCOUNTER — Other Ambulatory Visit: Payer: Self-pay

## 2022-02-14 ENCOUNTER — Telehealth: Payer: Self-pay

## 2022-02-14 NOTE — Telephone Encounter (Signed)
Called pt to schedule appt with Miosotis Wetsel. Left mail.

## 2022-02-19 ENCOUNTER — Other Ambulatory Visit: Payer: Self-pay

## 2022-02-19 ENCOUNTER — Encounter: Payer: Self-pay | Admitting: Student in an Organized Health Care Education/Training Program

## 2022-02-19 ENCOUNTER — Other Ambulatory Visit
Admission: RE | Admit: 2022-02-19 | Discharge: 2022-02-19 | Disposition: A | Discharge status: Home / Self Care | Payer: Self-pay | Attending: Student in an Organized Health Care Education/Training Program | Admitting: Student in an Organized Health Care Education/Training Program

## 2022-02-19 ENCOUNTER — Ambulatory Visit (INDEPENDENT_AMBULATORY_CARE_PROVIDER_SITE_OTHER): Payer: Self-pay | Admitting: Student in an Organized Health Care Education/Training Program

## 2022-02-19 VITALS — BP 150/98 | HR 95 | Temp 98.4°F | Ht 63.0 in | Wt 110.0 lb

## 2022-02-19 DIAGNOSIS — F17210 Nicotine dependence, cigarettes, uncomplicated: Secondary | ICD-10-CM

## 2022-02-19 DIAGNOSIS — R0602 Shortness of breath: Secondary | ICD-10-CM

## 2022-02-19 MED ORDER — NICOTINE POLACRILEX 4 MG MT LOZG
4.0000 mg | LOZENGE | OROMUCOSAL | 0 refills | Status: DC | PRN
  Filled 2022-02-19: qty 100, fill #0

## 2022-02-19 MED ORDER — NICOTINE 21 MG/24HR TD PT24
21.0000 mg | MEDICATED_PATCH | TRANSDERMAL | 2 refills | Status: AC
  Filled 2022-02-19: qty 30, 30d supply, fill #0

## 2022-02-19 NOTE — Patient Instructions (Signed)
Today, we discussed our workup for your shortness of breath. I will start by ordering a breathing test (pulmonary function test) in addition to CT scan of your chest I have also prescribed you nicotine to help with your cigarette cravings to help you quit smoking

## 2022-02-19 NOTE — Progress Notes (Signed)
Synopsis: Referred in for shortness of breath by Iloabachie, Chioma E, NP  Assessment & Plan:   #Shortness of Breath  She is presenting with a chief complaint of shortness of breath that is exertional and at rest.  She has had a chest x-ray that was read as abnormal and concerning for interstitial lung disease.  I reviewed her previous CT scans which were in the setting of acute infection (empyema, chest tube placement) that showed subpleural reticulation in addition to intralobular septal thickening.  Furthermore, she does have a history of a positive rheumatoid factor.  To this effect, I am concerned about interstitial lung disease secondary to connective tissue disease.  I will order a high-resolution CT scan of the chest to work this up in addition to obtaining anti-CCP antibodies.  Furthermore, she does have a history of hepatitis C and I will obtain cryoglobulin levels.  Finally, she will need to pulmonary function test ([place], lung volumes, DLCO) to evaluate her for reactive airway disease as she is at high risk for COPD given her smoking history.  #Nicotine Dependence  Discussed smoking cessation at length.  Patient is not currently interested in quitting however she is interested in harm reduction.  To that effect, I will prescribe her 21 mg nicotine patches to be supplemented by 4 mg nicotine the lozenges.  I asked her to use these to try and decrease the number of cigarettes she smokes daily.  She promised that she will and I will bring this up again at a follow-up visit.  -Pulmonary function testing -Anti-CCP, cryoglobulins -High-resolution CT scan of the chest -Smoking cessation counseling given, nicotine patches 21 mg daily in addition to 4 mg lozenges every 2 hours as needed -Continue previously prescribed Advair (100/50) 1 puff twice daily.  Stop Dulera.  Return in about 2 months (around 04/21/2022).  I spent 50 minutes caring for this patient today, including preparing  to see the patient, obtaining and/or reviewing separately obtained history, performing a medically appropriate examination and/or evaluation, counseling and educating the patient/family/caregiver, ordering medications, tests, or procedures, and documenting clinical information in the electronic health record  Armando Reichert, MD [facility] 02/19/2022 12:38 PM    End of visit medications:  Current Outpatient Medications:    albuterol (PROVENTIL HFA) 108 (90 Base) MCG/ACT inhaler, Inhale 1-2 puffs into the lungs once every 6 (six) hours as needed for wheezing or shortness of breath., Disp: 6.7 g, Rfl: 5   Blood Pressure KIT, USE AS DIRECTED TWICE DAILY., Disp: 1 kit, Rfl: 0   buprenorphine-naloxone (SUBOXONE) 8-2 mg SUBL SL tablet, Place 1 tablet under the tongue daily., Disp: , Rfl:    fluticasone-salmeterol (WIXELA INHUB) 100-50 MCG/ACT AEPB, Inhale 1 puff into the lungs 2 (two) times daily., Disp: 60 each, Rfl: 3   guaiFENesin (ROBITUSSIN) 100 MG/5ML liquid, Take 5 mLs (100 mg total) by mouth every 4 (four) hours as needed for cough or to loosen phlegm. Take during the day, Disp: 240 mL, Rfl: 0   hydrALAZINE (APRESOLINE) 50 MG tablet, Take 1 tablet (50 mg total) by mouth 3 (three) times daily., Disp: 90 tablet, Rfl: 0   lisinopril (ZESTRIL) 40 MG tablet, Take 1 tablet (40 mg total) by mouth daily., Disp: 30 tablet, Rfl: 0   nicotine (NICODERM CQ - DOSED IN MG/24 HOURS) 21 mg/24hr patch, Place 1 patch (21 mg total) onto the skin daily., Disp: 30 patch, Rfl: 2   nicotine polacrilex (NICOTINE MINI) 4 MG lozenge, Take 1  lozenge (4 mg total) by mouth as needed for smoking cessation., Disp: 100 tablet, Rfl: 0   QUEtiapine (SEROQUEL) 100 MG tablet, Take 1 tablet (100 mg total) by mouth once nightly at bedtime., Disp: 30 tablet, Rfl: 0   Subjective:   PATIENT ID: Nicole [name] GENDER: female DOB: 1961-04-19, MRN: [id number]  Chief Complaint  Patient presents with    pulmonary consult    CXR 01/28/22. SOB with exertion and at rest, prod cough with green sputum and wheezing x2y.    HPI  Nicole [name] is a pleasant 61 year old female with a past medical history of empyema 2 years ago requiring a chest tube as well as a longstanding history of smoking who presents today for the chief complaint of shortness of breath as well as cough.  Today, she is accompanied by her husband and daughter on this visit.  Patient reports that she has been having shortness of breath and cough for the past 2 to 3 years which has recently worsened. She was admitted around 2 years ago with pneumonia and was diagnosed with an empyema requiring chest tube placement and a prolonged course of antibiotics.  She reports that she has had 5 or 6 pneumonias in her lifetime.  She started smoking at the age of 31 and continues to smoke now.  She smokes 1 pack of cigarettes a day.  She does have alcohol use disorder and was recently admitted with intoxication and detoxed in the hospital.  She reports that she is having an occasional drink but has not been heavily drinking.  She reports having had 1 drink yesterday. She reports that she has hypertension and that her blood pressure is better controlled today (027 systolic) as compared to how it is at home. She is on Hydralazine and Lisinopril for that and follows with her PCP.  She denies any fevers but does report chills and night sweats.  She does not have any chest pain or chest tightness.  She reports a chronic cough that is productive of whitish sputum.  The quality of the cough and the nature of the sputum produced has not changed.  She reports having arthritis and has had a positive blood test for rheumatoid factor.  Ancillary information including prior medications, full medical/surgical/family/social histories, and PFTs (when available) are listed below and have been reviewed.   Review of Systems  Constitutional:  Positive for chills. Negative for  fever.  Respiratory:  Positive for cough, sputum production and shortness of breath.   Cardiovascular:  Negative for chest pain.  Gastrointestinal:  Negative for abdominal pain.  Skin:  Negative for rash.     Objective:   Vitals:   02/19/22 1147  BP: (!) 150/98  Pulse: 95  Temp: 98.4 F (36.9 C)  TempSrc: Temporal  SpO2: 98%  Weight: 110 lb (49.9 kg)  Height: '5\' 3"'  (1.6 m)   98% on RA BMI Readings from Last 3 Encounters:  02/19/22 19.49 kg/m  02/01/22 18.71 kg/m  01/29/22 18.20 kg/m   Wt Readings from Last 3 Encounters:  02/19/22 110 lb (49.9 kg)  02/01/22 105 lb 9.6 oz (47.9 kg)  01/29/22 102 lb 11.8 oz (46.6 kg)    Physical Exam Constitutional:      General: She is not in acute distress.    Appearance: Normal appearance.  HENT:     Nose: Nose normal.     Mouth/Throat:     Mouth: Mucous membranes are moist.  Eyes:     Pupils: Pupils  are equal, round, and reactive to light.  Cardiovascular:     Rate and Rhythm: Normal rate and regular rhythm.     Heart sounds: Normal heart sounds.  Pulmonary:     Breath sounds: Wheezing (scattered wheezes) present.  Abdominal:     Palpations: Abdomen is soft.  Musculoskeletal:        General: No swelling.     Cervical back: Normal range of motion.  Neurological:     Mental Status: She is alert.       Ancillary Information    Past Medical History:  Diagnosis Date   Anxiety    Arthritis    Asthma    Bipolar disorder ([place])    Collagen vascular disease ([place])    COPD (chronic obstructive pulmonary disease) ([place])    Hepatitis C    Hypertension    Substance abuse ([place])      Family History  Problem Relation Age of Onset   Depression Mother    Hypertension Mother    Heart attack Mother    Insomnia Mother    Thyroid disease Mother    Bipolar disorder Mother    Other Father        Parkinson's disease   Parkinson's disease Father    Arthritis Maternal Grandmother    Bipolar disorder Maternal Grandmother     Depression Maternal Grandmother    Other Maternal Grandfather        unknown medical history   Heart attack Paternal Grandmother    Ovarian cancer Paternal Grandmother    Hyperlipidemia Paternal Grandfather    Hypertension Paternal Grandfather    Congestive Heart Failure Paternal Grandfather    Rheum arthritis Other    Osteoporosis Other    Clotting disorder Other    Stroke Other      Past Surgical History:  Procedure Laterality Date   ABDOMINAL HYSTERECTOMY     TONSILLECTOMY     TONSILLECTOMY      Social History   Socioeconomic History   Marital status: Married    Spouse name: Not on file   Number of children: Not on file   Years of education: Not on file   Highest education level: Not on file  Occupational History   Occupation: Housewife  Tobacco Use   Smoking status: Every Day    Packs/day: 2.00    Years: 33.00    Total pack years: 66.00    Types: Cigarettes   Smokeless tobacco: Never   Tobacco comments:    1PPD 02/19/2022  Vaping Use   Vaping Use: Never used  Substance and Sexual Activity   Alcohol use: Yes    Alcohol/week: 4.0 standard drinks of alcohol    Types: 4 Standard drinks or equivalent per week    Comment: 1 pint a day of brown liquor with pepsi   Drug use: Yes    Types: Cocaine, Marijuana    Comment: uses marijuana occassionally, last Cocaine ~ 2017   Sexual activity: Not Currently  Other Topics Concern   Not on file  Social History Narrative   Not on file   Social Determinants of Health   Financial Resource Strain: Not on file  Food Insecurity: No Food Insecurity (10/03/2021)   Hunger Vital Sign    Worried About Running Out of Food in the Last Year: Never true    Ran Out of Food in the Last Year: Never true  Transportation Needs: No Transportation Needs (10/03/2021)   PRAPARE - Transportation    Lack of  Transportation (Medical): No    Lack of Transportation (Non-Medical): No  Physical Activity: Not on file  Stress: Not on file  Social  Connections: Not on file  Intimate Partner Violence: Not on file     No Known Allergies   CBC    Component Value Date/Time   WBC 4.9 01/30/2022 0353   RBC 3.96 01/30/2022 0353   HGB 14.5 01/30/2022 0353   HCT 40.6 01/30/2022 0353   PLT 95 (L) 01/30/2022 0353   MCV 102.5 (H) 01/30/2022 0353   MCH 36.6 (H) 01/30/2022 0353   MCHC 35.7 01/30/2022 0353   RDW 11.3 (L) 01/30/2022 0353   LYMPHSABS 3.7 09/06/2020 0742   MONOABS 0.9 09/06/2020 0742   EOSABS 0.1 09/06/2020 0742   BASOSABS 0.1 09/06/2020 0742    Pulmonary Functions Testing Results:     No data to display          Outpatient Medications Prior to Visit  Medication Sig Dispense Refill   albuterol (PROVENTIL HFA) 108 (90 Base) MCG/ACT inhaler Inhale 1-2 puffs into the lungs once every 6 (six) hours as needed for wheezing or shortness of breath. 6.7 g 5   Blood Pressure KIT USE AS DIRECTED TWICE DAILY. 1 kit 0   buprenorphine-naloxone (SUBOXONE) 8-2 mg SUBL SL tablet Place 1 tablet under the tongue daily.     fluticasone-salmeterol (WIXELA INHUB) 100-50 MCG/ACT AEPB Inhale 1 puff into the lungs 2 (two) times daily. 60 each 3   guaiFENesin (ROBITUSSIN) 100 MG/5ML liquid Take 5 mLs (100 mg total) by mouth every 4 (four) hours as needed for cough or to loosen phlegm. Take during the day 240 mL 0   hydrALAZINE (APRESOLINE) 50 MG tablet Take 1 tablet (50 mg total) by mouth 3 (three) times daily. 90 tablet 0   lisinopril (ZESTRIL) 40 MG tablet Take 1 tablet (40 mg total) by mouth daily. 30 tablet 0   QUEtiapine (SEROQUEL) 100 MG tablet Take 1 tablet (100 mg total) by mouth once nightly at bedtime. 30 tablet 0   No facility-administered medications prior to visit.

## 2022-02-20 LAB — CYCLIC CITRUL PEPTIDE ANTIBODY, IGG/IGA: CCP Antibodies IgG/IgA: <1 units (ref 0–19)

## 2022-02-22 ENCOUNTER — Ambulatory Visit
Admission: RE | Admit: 2022-02-22 | Discharge: 2022-02-22 | Disposition: A | Discharge status: Home / Self Care | Payer: Medicaid Other | Source: Ambulatory Visit | Attending: Student in an Organized Health Care Education/Training Program | Admitting: Student in an Organized Health Care Education/Training Program

## 2022-02-22 DIAGNOSIS — F17210 Nicotine dependence, cigarettes, uncomplicated: Secondary | ICD-10-CM | POA: Insufficient documentation

## 2022-02-22 DIAGNOSIS — R0602 Shortness of breath: Secondary | ICD-10-CM | POA: Insufficient documentation

## 2022-02-22 DIAGNOSIS — J439 Emphysema, unspecified: Secondary | ICD-10-CM | POA: Insufficient documentation

## 2022-02-22 DIAGNOSIS — I2510 Atherosclerotic heart disease of native coronary artery without angina pectoris: Secondary | ICD-10-CM | POA: Insufficient documentation

## 2022-02-22 DIAGNOSIS — I700 Atherosclerosis of aorta: Secondary | ICD-10-CM | POA: Insufficient documentation

## 2022-02-28 ENCOUNTER — Other Ambulatory Visit: Payer: Self-pay

## 2022-02-28 ENCOUNTER — Other Ambulatory Visit: Payer: Self-pay | Admitting: Gerontology

## 2022-02-28 DIAGNOSIS — Z8659 Personal history of other mental and behavioral disorders: Secondary | ICD-10-CM

## 2022-02-28 MED FILL — Quetiapine Fumarate Tab 100 MG: ORAL | 30 days supply | Qty: 30 | Fill #0 | Status: AC

## 2022-02-28 NOTE — Progress Notes (Unsigned)
Pt called in for refill. Heather approved. Pt has appt on tues. Pt verbalized understanding about appt.

## 2022-03-06 ENCOUNTER — Ambulatory Visit: Payer: Self-pay | Admitting: Licensed Clinical Social Worker

## 2022-03-06 DIAGNOSIS — Z8659 Personal history of other mental and behavioral disorders: Secondary | ICD-10-CM

## 2022-03-06 NOTE — BH Specialist Note (Signed)
Integrated Behavioral Health via Telemedicine Visit  03/06/2022 Waynette Towers [id number]  Number of [facility] Clinician visits: No data recorded Session Start time: No data recorded  Session End time: No data recorded Total time in minutes: No data recorded  Referring Provider: Carlyon Shadow, NP Patient/Family location: The Patient's home  [facility] Provider location: [facility] [place] All persons participating in visit: Maliea Grandmaison. Salome and [employer], LCSW-A Types of Service: Telephone visit  I connected with Carlyon Shadow Bourcier via  Telephone or Video Enabled Telemedicine Application  (Video is Caregility application) and verified that I am speaking with the correct person using two identifiers. Discussed confidentiality: Yes   I discussed the limitations of telemedicine and the availability of in person appointments.  Discussed there is a possibility of technology failure and discussed alternative modes of communication if that failure occurs.  I discussed that engaging in this telemedicine visit, they consent to the provision of behavioral healthcare and the services will be billed under their insurance.  Patient and/or legal guardian expressed understanding and consented to Telemedicine visit: Yes   Presenting Concerns: Patient and/or family reports the following symptoms/concerns: The patient reports that she has improved since her last follow-up appointment. The patient reports that she no longer drinks alcohol and has remained sober since her hospitalization for alcohol detox on 01/28/2022. She stated that she has no legal actions pending regarding that event. She noted that she is taking Seroquel 100 MG at bedtime as prescribed and has noticed her sleep and overall mood have improved. She shared that since she has stopped drinking alcohol, she has started to regain her appetite and has gained about 10 pounds. The patient discussed  situational stressors impacting her current life. She noted that she is also concerned about her health after a recent chest CT showed abnormalities. She shared that her daughter and 67-year-old grandson have moved in, and she feels more supported having her daughter to confide in. The patient denied any suicidal or homicidal thoughts.  Duration of problem: Years; Severity of problem: moderate  Patient and/or Family's Strengths/Protective Factors: Social connections, Concrete supports in place (healthy food, safe environments, etc.), and Sense of purpose  Goals Addressed: Patient will:  Reduce symptoms of: anxiety, depression, insomnia, and stress   Increase knowledge and/or ability of: coping skills, healthy habits, self-management skills, and stress reduction   Demonstrate ability to: Increase healthy adjustment to current life circumstances and Decrease self-medicating behaviors  Progress towards Goals: Ongoing  Interventions: Interventions utilized:  CBT Cognitive Behavioral Therapy was utilized by the clinician during today's follow up session. Clinician met with patient to identify needs related to stressors and functioning, and assess and monitor for signs and symptoms of anxiety and depression, and assess safety. The clinician processed with the patient how they have been doing since the last follow-up session. Clinician explored the events surrounding the patient's hospital admission on 01/28/2022. The patient's compliance with taking Seroquel 100 MG at bedtime, prescribed by her primary care provider, was monitored by the Clinician, and the effectiveness of the medication on the patient's level of functioning was noted to improve. The clinician encouraged the patient to utilize their coping skills to deal with their current life circumstances. Clinician informed the patient that she will update the [facility] Case Consultation Team of her progress on 03/13/2022 at 11:00 AM. The session ended with  scheduling.  Standardized Assessments completed: GAD-7 and PHQ 9 GAD-7= 15 PHQ-9= 9   Assessment: Patient currently  experiencing see above.   Patient may benefit from see above.  Plan: Follow up with behavioral health clinician on : 03/15/2022 at 3:00 PM  Behavioral recommendations:  Referral(s): [place] (In Clinic)  I discussed the assessment and treatment plan with the patient and/or parent/guardian. They were provided an opportunity to ask questions and all were answered. They agreed with the plan and demonstrated an understanding of the instructions.   They were advised to call back or seek an in-person evaluation if the symptoms worsen or if the condition fails to improve as anticipated.  [name], LCSWA

## 2022-03-07 ENCOUNTER — Telehealth: Payer: Self-pay | Admitting: Student in an Organized Health Care Education/Training Program

## 2022-03-07 NOTE — Telephone Encounter (Signed)
Patient is aware that PFT will be scheduled in October prior to f/u. We do not have any availability in September for PFT at [facility]. Offered sooner PFT appt in [place]. She stated that she would prefer to stay in [place], however she will call back to schedule appt in [place] if she can arrange transportation.    Nothing further needed.

## 2022-03-08 ENCOUNTER — Other Ambulatory Visit: Payer: Self-pay

## 2022-03-08 ENCOUNTER — Encounter: Payer: Self-pay | Admitting: Gerontology

## 2022-03-08 ENCOUNTER — Ambulatory Visit: Payer: Medicaid Other | Admitting: Gerontology

## 2022-03-08 VITALS — BP 174/99 | HR 85 | Temp 98.7°F | Resp 16 | Ht 63.0 in | Wt 113.7 lb

## 2022-03-08 DIAGNOSIS — I10 Essential (primary) hypertension: Secondary | ICD-10-CM

## 2022-03-08 DIAGNOSIS — R944 Abnormal results of kidney function studies: Secondary | ICD-10-CM | POA: Insufficient documentation

## 2022-03-08 DIAGNOSIS — Z8709 Personal history of other diseases of the respiratory system: Secondary | ICD-10-CM

## 2022-03-08 DIAGNOSIS — F17200 Nicotine dependence, unspecified, uncomplicated: Secondary | ICD-10-CM

## 2022-03-08 MED ORDER — HYDRALAZINE HCL 50 MG PO TABS
50.0000 mg | ORAL_TABLET | Freq: Three times a day (TID) | ORAL | 2 refills | Status: DC
  Filled 2022-03-08: qty 90, 30d supply, fill #0

## 2022-03-08 MED ORDER — LISINOPRIL 40 MG PO TABS
40.0000 mg | ORAL_TABLET | Freq: Every day | ORAL | 1 refills | Status: DC
  Filled 2022-03-08: qty 90, 90d supply, fill #0

## 2022-03-08 NOTE — Progress Notes (Signed)
Established Patient Office Visit  Subjective   Patient ID: Nicole [name], female    DOB: [birth year]  Age: 61 y.o. MRN: [id number]  Chief Complaint  Patient presents with   Follow-up    HPI  Nicole [name] is a 60y/o female who has history of bipolar, COPD, hypertension, asthma, anxiety, collagen vascular disease, hepatitis C , substance use disorder. She was seen by the Pulmonologist Dr Berdine Addison D on 02/19/22 for shortness of breath, recommended to continue Advair and stop Dulera and will follow up in October. Her blood was elevated during visit at 174/99, she denies chest pain, palpitation and light headedness. She continues to smoke 1 pack of cigarette daily, uses nicotine patches. She states that she's compliant with her medications, and denies side effects. She checks her blood pressure daily and it's usually elevated in the 170's/100's. Her serum creatinine done on 02/01/22 was 1.42 mg/dl and eGFR was 43. Overall, she states that she's doing well and offers no further complaint.  Review of Systems  Constitutional: Negative.   Eyes: Negative.   Respiratory: Negative.  Negative for shortness of breath.   Cardiovascular: Negative.   Neurological: Negative.   Psychiatric/Behavioral: Negative.        Objective:     BP (!) 174/99 (BP Location: Right Arm, Patient Position: Sitting, Cuff Size: Normal)   Pulse 85   Temp 98.7 F (37.1 C) (Oral)   Resp 16   Ht '5\' 3"'  (1.6 m)   Wt 113 lb 11.2 oz (51.6 kg)   SpO2 98%   BMI 20.14 kg/m  BP Readings from Last 3 Encounters:  03/08/22 (!) 174/99  02/19/22 (!) 150/98  02/01/22 126/83   Wt Readings from Last 3 Encounters:  03/08/22 113 lb 11.2 oz (51.6 kg)  02/19/22 110 lb (49.9 kg)  02/01/22 105 lb 9.6 oz (47.9 kg)      Physical Exam HENT:     Head: Normocephalic and atraumatic.     Mouth/Throat:     Mouth: Mucous membranes are moist.  Eyes:     Extraocular Movements: Extraocular movements intact.      Conjunctiva/sclera: Conjunctivae normal.     Pupils: Pupils are equal, round, and reactive to light.  Cardiovascular:     Rate and Rhythm: Normal rate and regular rhythm.     Pulses: Normal pulses.     Heart sounds: Normal heart sounds.  Pulmonary:     Effort: Pulmonary effort is normal.     Breath sounds: Examination of the right-upper field reveals rhonchi. Examination of the left-upper field reveals rhonchi. Examination of the right-middle field reveals rhonchi. Examination of the left-middle field reveals rhonchi. Examination of the right-lower field reveals rhonchi. Examination of the left-lower field reveals rhonchi. Rhonchi present.  Neurological:     General: No focal deficit present.     Mental Status: She is alert and oriented to person, place, and time. Mental status is at baseline.  Psychiatric:        Mood and Affect: Mood normal.        Behavior: Behavior normal.        Thought Content: Thought content normal.        Judgment: Judgment normal.      No results found for any visits on 03/08/22.  Last CBC Lab Results  Component Value Date   WBC 4.9 01/30/2022   HGB 14.5 01/30/2022   HCT 40.6 01/30/2022   MCV 102.5 (H) 01/30/2022   MCH 36.6 (H) 01/30/2022  RDW 11.3 (L) 01/30/2022   PLT 95 (L) 50/41/3643   Last metabolic panel Lab Results  Component Value Date   GLUCOSE 101 (H) 02/01/2022   NA 138 02/01/2022   K 3.7 02/01/2022   CL 100 02/01/2022   CO2 23 02/01/2022   BUN 37 (H) 02/01/2022   CREATININE 1.42 (H) 02/01/2022   EGFR 43 (L) 02/01/2022   CALCIUM 11.0 (H) 02/01/2022   PHOS 2.5 01/28/2022   PROT 7.3 02/01/2022   ALBUMIN 4.0 02/01/2022   LABGLOB 3.3 02/01/2022   AGRATIO 1.2 02/01/2022   BILITOT 0.8 02/01/2022   ALKPHOS 119 02/01/2022   AST 396 (H) 02/01/2022   ALT 244 (H) 02/01/2022   ANIONGAP 8 01/30/2022   Last lipids Lab Results  Component Value Date   CHOL 151 10/03/2021   HDL 77 10/03/2021   LDLCALC 57 10/03/2021   TRIG 90  10/03/2021   CHOLHDL 2.0 10/03/2021   Last hemoglobin A1c No results found for: "HGBA1C" Last thyroid functions No results found for: "TSH", "T3TOTAL", "T4TOTAL", "THYROIDAB" Last vitamin D No results found for: "25OHVITD2", "25OHVITD3", "VD25OH"    The 10-year ASCVD risk score (Arnett DK, et al., 2019) is: 9.7%    Assessment & Plan:   1. Primary hypertension - Her blood pressure is not under controlled, she will continue on current medication, advised on smoking cessation. She was educated on the signs and symptoms of Stroke and advised to go to the ED, continue on DASH diet. - hydrALAZINE (APRESOLINE) 50 MG tablet; Take 1 tablet (50 mg total) by mouth 3 (three) times daily.  Dispense: 90 tablet; Refill: 2 - lisinopril (ZESTRIL) 40 MG tablet; Take 1 tablet (40 mg total) by mouth daily for 90 doses.  Dispense: 90 tablet; Refill: 1 - Comp Met (CMET); Future - CBC w/Diff; Future - CBC w/Diff - Comp Met (CMET)  2. Smoking - She was strongly encouraged on smoking cessation, advised not to apply nicotine patch and smoke .  3. History of COPD - Rhonchi to lung field, states that her breathing is stable, advised to continue on inhalers, and smoking cessation.  4. Abnormal renal function test - She was encouraged to increase water intake, monitor blood pressure and smoking cessation.   Return in about 4 weeks (around 04/05/2022), or if symptoms worsen or fail to improve.    Nicole Kievit Jerold Coombe, NP

## 2022-03-08 NOTE — Patient Instructions (Signed)
Smoking Tobacco Information, Adult Smoking tobacco can be harmful to your health. Tobacco contains a toxic colorless chemical called nicotine. Nicotine causes changes in your brain that make you want more and more. This is called addiction. This can make it hard to stop smoking once you start. Tobacco also has other toxic chemicals that can hurt your body and raise your risk of many cancers. Menthol or "lite" tobacco or cigarette brands are not safer than regular brands. How can smoking tobacco affect me? Smoking tobacco puts you at risk for: Cancer. Smoking is most commonly associated with lung cancer, but can also lead to cancer in other parts of the body. Chronic obstructive pulmonary disease (COPD). This is a long-term lung condition that makes it hard to breathe. It also gets worse over time. High blood pressure (hypertension), heart disease, stroke, heart attack, and lung infections, such as pneumonia. Cataracts. This is when the lenses in the eyes become clouded. Digestive problems. This may include peptic ulcers, heartburn, and gastroesophageal reflux disease (GERD). Oral health problems, such as gum disease, mouth sores, and tooth loss. Loss of taste and smell. Smoking also affects how you look and smell. Smoking may cause: Wrinkles. Yellow or stained teeth, fingers, and fingernails. Bad breath. Bad-smelling clothes and hair. Smoking tobacco can also affect your social life, because: It may be challenging to find places to smoke when away from home. Many workplaces, restaurants, hotels, and public places are tobacco-free. Smoking is expensive. This is due to the cost of tobacco and the long-term costs of treating health problems from smoking. Secondhand smoke may affect those around you. Secondhand smoke can cause lung cancer, breathing problems, and heart disease. Children of smokers have a higher risk for: Sudden infant death syndrome (SIDS). Ear infections. Lung infections. What  actions can I take to prevent health problems? Quit smoking  Do not start smoking. Quit if you already smoke. Do not replace cigarette smoking with vaping devices, such as e-cigarettes. Make a plan to quit smoking and commit to it. Look for programs to help you, and ask your health care provider for recommendations and ideas. Set a date and write down all the reasons you want to quit. Let your friends and family know you are quitting so they can help and support you. Consider finding friends who also want to quit. It can be easier to quit with someone else, so that you can support each other. Talk with your health care provider about using nicotine replacement medicines to help you quit. These include gum, lozenges, patches, sprays, or pills. If you try to quit but return to smoking, stay positive. It is common to slip up when you first quit, so take it one day at a time. Be prepared for cravings. When you feel the urge to smoke, chew gum or suck on hard candy. Lifestyle Stay busy. Take care of your body. Get plenty of exercise, eat a healthy diet, and drink plenty of water. Find ways to manage your stress, such as meditation, yoga, exercise, or time spent with friends and family. Ask your health care provider about having regular tests (screenings) to check for cancer. This may include blood tests, imaging tests, and other tests. Where to find support To get support to quit smoking, consider: Asking your health care provider for more information and resources. Joining a support group for people who want to quit smoking in your local community. There are many effective programs that may help you to quit. Calling the smokefree.gov counselor   helpline at 1-800-QUIT-NOW 970-196-4616). Where to find more information You may find more information about quitting smoking from: Centers for Disease Control and Prevention: https://www.chang-huffman.com/ https://hall.com/: smokefree.gov American Lung Association:  freedomfromsmoking.org Contact a health care provider if: You have problems breathing. Your lips, nose, or fingers turn blue. You have chest pain. You are coughing up blood. You feel like you will faint. You have other health changes that cause you to worry. Summary Smoking tobacco can negatively affect your health, the health of those around you, your finances, and your social life. Do not start smoking. Quit if you already smoke. If you need help quitting, ask your health care provider. Consider joining a support group for people in your local community who want to quit smoking. There are many effective programs that may help you to quit. This information is not intended to replace advice given to you by your health care provider. Make sure you discuss any questions you have with your health care provider. Document Revised: 06/13/2021 Document Reviewed: 06/13/2021 Elsevier Patient Education  [place] Eating Plan DASH stands for Dietary Approaches to Stop Hypertension. The DASH eating plan is a healthy eating plan that has been shown to: Reduce high blood pressure (hypertension). Reduce your risk for type 2 diabetes, heart disease, and stroke. Help with weight loss. What are tips for following this plan? Reading food labels Check food labels for the amount of salt (sodium) per serving. Choose foods with less than 5 percent of the Daily Value of sodium. Generally, foods with less than 300 milligrams (mg) of sodium per serving fit into this eating plan. To find whole grains, look for the word "whole" as the first word in the ingredient list. Shopping Buy products labeled as "low-sodium" or "no salt added." Buy fresh foods. Avoid canned foods and pre-made or frozen meals. Cooking Avoid adding salt when cooking. Use salt-free seasonings or herbs instead of table salt or sea salt. Check with your health care provider or pharmacist before using salt substitutes. Do not fry  foods. Cook foods using healthy methods such as baking, boiling, grilling, roasting, and broiling instead. Cook with heart-healthy oils, such as olive, canola, avocado, soybean, or sunflower oil. Meal planning  Eat a balanced diet that includes: 4 or more servings of fruits and 4 or more servings of vegetables each day. Try to fill one-half of your plate with fruits and vegetables. 6-8 servings of whole grains each day. Less than 6 oz (170 g) of lean meat, poultry, or fish each day. A 3-oz (85-g) serving of meat is about the same size as a deck of cards. One egg equals 1 oz (28 g). 2-3 servings of low-fat dairy each day. One serving is 1 cup (237 mL). 1 serving of nuts, seeds, or beans 5 times each week. 2-3 servings of heart-healthy fats. Healthy fats called omega-3 fatty acids are found in foods such as walnuts, flaxseeds, fortified milks, and eggs. These fats are also found in cold-water fish, such as sardines, salmon, and mackerel. Limit how much you eat of: Canned or prepackaged foods. Food that is high in trans fat, such as some fried foods. Food that is high in saturated fat, such as fatty meat. Desserts and other sweets, sugary drinks, and other foods with added sugar. Full-fat dairy products. Do not salt foods before eating. Do not eat more than 4 egg yolks a week. Try to eat at least 2 vegetarian meals a week. Eat more home-cooked food and less restaurant,  buffet, and fast food. Lifestyle When eating at a restaurant, ask that your food be prepared with less salt or no salt, if possible. If you drink alcohol: Limit how much you use to: 0-1 drink a day for women who are not pregnant. 0-2 drinks a day for men. Be aware of how much alcohol is in your drink. In the [place] one drink equals one 12 oz bottle of beer (355 mL), one 5 oz glass of wine (148 mL), or one 1 oz glass of hard liquor (44 mL). General information Avoid eating more than 2,300 mg of salt a day. If you have  hypertension, you may need to reduce your sodium intake to 1,500 mg a day. Work with your health care provider to maintain a healthy body weight or to lose weight. Ask what an ideal weight is for you. Get at least 30 minutes of exercise that causes your heart to beat faster (aerobic exercise) most days of the week. Activities may include walking, swimming, or biking. Work with your health care provider or dietitian to adjust your eating plan to your individual calorie needs. What foods should I eat? Fruits All fresh, dried, or frozen fruit. Canned fruit in natural juice (without added sugar). Vegetables Fresh or frozen vegetables (raw, steamed, roasted, or grilled). Low-sodium or reduced-sodium tomato and vegetable juice. Low-sodium or reduced-sodium tomato sauce and tomato paste. Low-sodium or reduced-sodium canned vegetables. Grains Whole-grain or whole-wheat bread. Whole-grain or whole-wheat pasta. Brown rice. [place]. Bulgur. Whole-grain and low-sodium cereals. Pita bread. Low-fat, low-sodium crackers. Whole-wheat flour tortillas. Meats and other proteins Skinless chicken or [place]. Ground chicken or [place]. Pork with fat trimmed off. Fish and seafood. Egg whites. Dried beans, peas, or lentils. Unsalted nuts, nut butters, and seeds. Unsalted canned beans. Lean cuts of beef with fat trimmed off. Low-sodium, lean precooked or cured meat, such as sausages or meat loaves. Dairy Low-fat (1%) or fat-free (skim) milk. Reduced-fat, low-fat, or fat-free cheeses. Nonfat, low-sodium ricotta or cottage cheese. Low-fat or nonfat yogurt. Low-fat, low-sodium cheese. Fats and oils Soft margarine without trans fats. Vegetable oil. Reduced-fat, low-fat, or light mayonnaise and salad dressings (reduced-sodium). Canola, safflower, olive, avocado, soybean, and sunflower oils. Avocado. Seasonings and condiments Herbs. Spices. Seasoning mixes without salt. Other foods Unsalted popcorn and pretzels. Fat-free  sweets. The items listed above may not be a complete list of foods and beverages you can eat. Contact a dietitian for more information. What foods should I avoid? Fruits Canned fruit in a light or heavy syrup. Fried fruit. Fruit in cream or butter sauce. Vegetables Creamed or fried vegetables. Vegetables in a cheese sauce. Regular canned vegetables (not low-sodium or reduced-sodium). Regular canned tomato sauce and paste (not low-sodium or reduced-sodium). Regular tomato and vegetable juice (not low-sodium or reduced-sodium). Angie Fava. Olives. Grains Baked goods made with fat, such as croissants, muffins, or some breads. Dry pasta or rice meal packs. Meats and other proteins Fatty cuts of meat. Ribs. Fried meat. Berniece Salines. Bologna, salami, and other precooked or cured meats, such as sausages or meat loaves. Fat from the back of a pig (fatback). Bratwurst. Salted nuts and seeds. Canned beans with added salt. Canned or smoked fish. Whole eggs or egg yolks. Chicken or [place] with skin. Dairy Whole or 2% milk, cream, and half-and-half. Whole or full-fat cream cheese. Whole-fat or sweetened yogurt. Full-fat cheese. Nondairy creamers. Whipped toppings. Processed cheese and cheese spreads. Fats and oils Butter. Stick margarine. Lard. Shortening. Ghee. Bacon fat. Tropical oils, such as coconut, palm  kernel, or palm oil. Seasonings and condiments Onion salt, garlic salt, seasoned salt, table salt, and sea salt. Worcestershire sauce. Tartar sauce. Barbecue sauce. Teriyaki sauce. Soy sauce, including reduced-sodium. Steak sauce. Canned and packaged gravies. Fish sauce. Oyster sauce. Cocktail sauce. Store-bought horseradish. Ketchup. Mustard. Meat flavorings and tenderizers. Bouillon cubes. Hot sauces. Pre-made or packaged marinades. Pre-made or packaged taco seasonings. Relishes. Regular salad dressings. Other foods Salted popcorn and pretzels. The items listed above may not be a complete list of foods and  beverages you should avoid. Contact a dietitian for more information. Where to find more information National Heart, Lung, and Blood Institute: www.nhlbi.nih.gov American Heart Association: www.heart.org Academy of Nutrition and Dietetics: www.eatright.org National Kidney Foundation: www.kidney.org Summary The DASH eating plan is a healthy eating plan that has been shown to reduce high blood pressure (hypertension). It may also reduce your risk for type 2 diabetes, heart disease, and stroke. When on the DASH eating plan, aim to eat more fresh fruits and vegetables, whole grains, lean proteins, low-fat dairy, and heart-healthy fats. With the DASH eating plan, you should limit salt (sodium) intake to 2,300 mg a day. If you have hypertension, you may need to reduce your sodium intake to 1,500 mg a day. Work with your health care provider or dietitian to adjust your eating plan to your individual calorie needs. This information is not intended to replace advice given to you by your health care provider. Make sure you discuss any questions you have with your health care provider. Document Revised: 05/22/2019 Document Reviewed: 05/22/2019 Elsevier Patient Education  2023 Elsevier Inc.  

## 2022-03-09 LAB — COMPREHENSIVE METABOLIC PANEL
ALT: 37 IU/L — ABNORMAL HIGH (ref 0–32)
AST: 59 IU/L — ABNORMAL HIGH (ref 0–40)
Albumin/Globulin Ratio: 1.1 — ABNORMAL LOW (ref 1.2–2.2)
Albumin: 3.6 g/dL — ABNORMAL LOW (ref 3.8–4.9)
Alkaline Phosphatase: 111 IU/L (ref 44–121)
BUN/Creatinine Ratio: 24 — ABNORMAL HIGH (ref 9–23)
BUN: 20 mg/dL (ref 6–24)
Bilirubin Total: 0.3 mg/dL (ref 0.0–1.2)
CO2: 25 mmol/L (ref 20–29)
Calcium: 9.8 mg/dL (ref 8.7–10.2)
Chloride: 99 mmol/L (ref 96–106)
Creatinine, Ser: 0.85 mg/dL (ref 0.57–1.00)
Globulin, Total: 3.3 g/dL (ref 1.5–4.5)
Glucose: 79 mg/dL (ref 70–99)
Potassium: 4.3 mmol/L (ref 3.5–5.2)
Sodium: 136 mmol/L (ref 134–144)
Total Protein: 6.9 g/dL (ref 6.0–8.5)
eGFR: 79 mL/min/{1.73_m2} (ref >59)

## 2022-03-09 LAB — CBC WITH DIFFERENTIAL/PLATELET
Basophils Absolute: 0.1 10*3/uL (ref 0.0–0.2)
Basos: 1 %
EOS (ABSOLUTE): 0.4 10*3/uL (ref 0.0–0.4)
Eos: 7 %
Hematocrit: 37.2 % (ref 34.0–46.6)
Hemoglobin: 13 g/dL (ref 11.1–15.9)
Immature Grans (Abs): 0 10*3/uL (ref 0.0–0.1)
Immature Granulocytes: 0 %
Lymphocytes Absolute: 1.7 10*3/uL (ref 0.7–3.1)
Lymphs: 29 %
MCH: 34.9 pg — ABNORMAL HIGH (ref 26.6–33.0)
MCHC: 34.9 g/dL (ref 31.5–35.7)
MCV: 100 fL — ABNORMAL HIGH (ref 79–97)
Monocytes Absolute: 0.6 10*3/uL (ref 0.1–0.9)
Monocytes: 10 %
Neutrophils Absolute: 3.2 10*3/uL (ref 1.4–7.0)
Neutrophils: 53 %
Platelets: 182 10*3/uL (ref 150–450)
RBC: 3.72 x10E6/uL — ABNORMAL LOW (ref 3.77–5.28)
RDW: 11.2 % — ABNORMAL LOW (ref 11.7–15.4)
WBC: 6 10*3/uL (ref 3.4–10.8)

## 2022-03-15 ENCOUNTER — Ambulatory Visit: Payer: Self-pay | Admitting: Licensed Clinical Social Worker

## 2022-03-15 ENCOUNTER — Telehealth: Payer: Self-pay | Admitting: Gerontology

## 2022-03-15 NOTE — Telephone Encounter (Signed)
Called pt to r/s appt for Nicole [name]. No answer. Left VM.

## 2022-03-21 ENCOUNTER — Telehealth: Payer: Self-pay | Admitting: Licensed Clinical Social Worker

## 2022-03-21 ENCOUNTER — Ambulatory Visit: Payer: Self-pay | Admitting: Licensed Clinical Social Worker

## 2022-03-21 NOTE — Telephone Encounter (Signed)
Called the patient twice during today's scheduled appointment; no answer, left a voicemail with the clinic contact information so they may reschedule.   

## 2022-03-22 ENCOUNTER — Telehealth: Payer: Self-pay | Admitting: Gerontology

## 2022-03-22 NOTE — Telephone Encounter (Signed)
1st no show for pt. Called to R/S with Heather . No answer. Left Vmail.

## 2022-03-29 ENCOUNTER — Other Ambulatory Visit: Payer: Self-pay

## 2022-03-29 ENCOUNTER — Telehealth: Payer: Self-pay | Admitting: Gerontology

## 2022-03-29 ENCOUNTER — Other Ambulatory Visit: Payer: Self-pay | Admitting: Gerontology

## 2022-03-29 DIAGNOSIS — Z8659 Personal history of other mental and behavioral disorders: Secondary | ICD-10-CM

## 2022-03-29 DIAGNOSIS — I10 Essential (primary) hypertension: Secondary | ICD-10-CM

## 2022-03-29 MED ORDER — LISINOPRIL 40 MG PO TABS
40.0000 mg | ORAL_TABLET | Freq: Every day | ORAL | 0 refills | Status: DC
  Filled 2022-03-29: qty 30, 30d supply, fill #0

## 2022-03-29 MED ORDER — QUETIAPINE FUMARATE 100 MG PO TABS
100.0000 mg | ORAL_TABLET | Freq: Every day | ORAL | 0 refills | Status: DC
  Filled 2022-03-29: qty 14, 14d supply, fill #0

## 2022-03-29 MED ORDER — HYDRALAZINE HCL 50 MG PO TABS
50.0000 mg | ORAL_TABLET | Freq: Three times a day (TID) | ORAL | 0 refills | Status: DC
  Filled 2022-03-29: qty 90, 30d supply, fill #0

## 2022-03-29 MED ORDER — QUETIAPINE FUMARATE 100 MG PO TABS
100.0000 mg | ORAL_TABLET | Freq: Every day | ORAL | 0 refills | Status: DC
  Filled 2022-03-29: qty 30, 30d supply, fill #0

## 2022-03-29 NOTE — Telephone Encounter (Signed)
Called pt back after she left messages wanting to rescedule appt with heather. She needs to reschedule a no show she had when she calls back

## 2022-03-29 NOTE — Telephone Encounter (Signed)
Pt called in needing refill. Advised pt I would send in enough until her next visit with Heather. PT verbalized understanding. Made appt on 10/10.

## 2022-03-30 ENCOUNTER — Other Ambulatory Visit: Payer: Self-pay

## 2022-04-05 ENCOUNTER — Ambulatory Visit: Payer: Medicaid Other

## 2022-04-05 ENCOUNTER — Ambulatory Visit: Payer: Medicaid Other | Admitting: Gerontology

## 2022-04-10 ENCOUNTER — Ambulatory Visit: Payer: Self-pay | Admitting: Licensed Clinical Social Worker

## 2022-04-10 DIAGNOSIS — F3160 Bipolar disorder, current episode mixed, unspecified: Secondary | ICD-10-CM

## 2022-04-10 NOTE — BH Specialist Note (Signed)
Integrated Behavioral Health via Telemedicine Visit  04/10/2022 Nicole [name] [id number]  Number of Integrated Behavioral Health Clinician visits: No data recorded Session Start time: No data recorded  Session End time: No data recorded Total time in minutes: No data recorded  Referring Provider: Elizabeth Chioma, NP Patient/Family location: The Patient's Home Address [facility] Provider location: [facility] All persons participating in visit: Lissa D. Colgan and  , LCSW-A Types of Service: Telephone visit  I connected with Nicole [name]  via  Telephone or Video Enabled Telemedicine Application  (Video is Caregility application) and verified that I am speaking with the correct person using two identifiers. Discussed confidentiality: Yes   I discussed the limitations of telemedicine and the availability of in person appointments.  Discussed there is a possibility of technology failure and discussed alternative modes of communication if that failure occurs.   Patient and/or legal guardian expressed understanding and consented to Telemedicine visit: Yes   Presenting Concerns: Patient and/or family reports the following symptoms/concerns: The patient reports that she has been doing worse since her last follow up appointment. She stated that she has continues to struggle to fall asleep and lays in bed for hours before she is able to fall asleep. The patient reported that once she falls asleep she sleeps for a hour before awakening and is unable to fall asleep again. The patient noted that her mother also suffered from severe insomnia.  The patient noted that she has been taking two 100 mg Seroquel before bedtime nightly. The patient denied any adverse reaction to her psychotropic medication and requested the clinician asked her prescriber for an increased dose.  Additionally the patient reported that she continues to take Suboxone slow release 2 mg  sublingually almost daily that she gets from a friend.  Ms. Grego discussed  family and other situational stressors that are impacting her life currently.  The patient endorsed feeling down and depressed more days than not, and difficulty concentrating every day.  She shared that she felt anxious and nervous every day struggled with excessive worrying, had trouble relaxing, and feeling as if something bad would happen every day. The patient denied any recent panic attacks.  Ms. Sze denied any suicidal or homicidal thoughts. Duration of problem: Years; Severity of problem: moderate  Patient and/or Family's Strengths/Protective Factors: Social connections, Concrete supports in place (healthy food, safe environments, etc.), and Sense of purpose  Goals Addressed: Patient will:  Reduce symptoms of: agitation, anxiety, depression, insomnia, and stress   Increase knowledge and/or ability of: coping skills, healthy habits, self-management skills, and stress reduction   Demonstrate ability to: Increase healthy adjustment to current life circumstances, Increase adequate support systems for patient/family, and Decrease self-medicating behaviors  Progress towards Goals: Ongoing  Interventions: Interventions utilized:  Medication Monitoring was utilized by the clinician during today's follow up session. Additionally, Clinician met with patient to identify needs related to stressors and functioning, and assess and monitor for signs and symptoms of anxiety and depression, and assess safety. The clinician processed with the patient how they have been doing since the last follow-up session. Clinician measured the patient's anxiety and depression on a numerical scale. Clinician assessed the patients pattern of sleep, bedtime routines, activities associated with the bed, activity and energy level while awake, night time snacking, stimulant use, daytime napping, total sleep amounts. Clinician explored the patients  thoughts and associated emotions regarding sleep.  Clinician provided Psychoeducation regarding sleep hygiene and encouraged the patient to   sleep without the television on, use her bed only for sleeping and sexual activity, and avoid stimulants such as caffeine and nicotine.  The session ended with scheduling. Standardized Assessments completed: GAD-7 and PHQ 9 GAD-7= 15 PHQ-9= 17  Assessment: Patient currently experiencing see above.   Patient may benefit from see above.  Plan: Follow up with behavioral health clinician on : Tuesday, April 17, 2022 at 5:00 PM. Behavioral recommendations:  Referral(s): [place] (In Clinic)  I discussed the assessment and treatment plan with the patient and/or parent/guardian. They were provided an opportunity to ask questions and all were answered. They agreed with the plan and demonstrated an understanding of the instructions.   They were advised to call back or seek an in-person evaluation if the symptoms worsen or if the condition fails to improve as anticipated.  [name], LCSWA

## 2022-04-12 ENCOUNTER — Ambulatory Visit: Payer: Medicaid Other

## 2022-04-17 ENCOUNTER — Telehealth: Payer: Self-pay | Admitting: Student in an Organized Health Care Education/Training Program

## 2022-04-17 ENCOUNTER — Ambulatory Visit: Payer: Self-pay | Admitting: Licensed Clinical Social Worker

## 2022-04-17 DIAGNOSIS — F3160 Bipolar disorder, current episode mixed, unspecified: Secondary | ICD-10-CM

## 2022-04-17 NOTE — BH Specialist Note (Signed)
Integrated Behavioral Health Follow Up In-Person Visit  MRN: [id number] Name: [name]  Number of [place] Clinician visits: No data recorded Session Start time: No data recorded  Session End time: No data recorded Total time in minutes: No data recorded  Types of Service: Telephone visit  Interpretor:No. Interpretor Name and Language: N/A  Subjective: Nicole [name] is a 61 y.o. female accompanied by  herself  Patient was referred by Carlyon Shadow, NP  for mental health. Patient reports the following symptoms/concerns: The patient reports that she has been doing worse since her last follow-up appointment. She shared that she is not able to eat due to lack of appetite. She explained that when she is under a lot of stress she becomes nauseous.  The patient noted that she has not slept more than 2 hours at a time.  She shared that when she is up she is usually cleaning her home.  The patient discussed familial stressors that have increased in her life since the last session.  She noted that her daughter experienced a relapse in her grandson was removed from their home. The patient shared that despite increasing her Seroquel to 200 mg she still struggles to fall asleep and stay asleep.  She noted that she cannot sleep without some type of background noise such as a fan or TV.  She stated that she stays in her room because it is her sanctuary it is decorated with her porcelain doll collection and blue lights which she finds calming. The patient noted that she would decrease Seroquel back to her prescribed 100 mg dose.  [name] denied any suicidal or homicidal thoughts. Duration of problem: Years; Severity of problem: moderate  Objective: Mood: Anxious and Affect: Appropriate Risk of harm to self or others: No plan to harm self or others  Life Context: Family and Social: see above School/Work: see above Self-Care: see above Life Changes: see  above  Patient and/or Family's Strengths/Protective Factors: Concrete supports in place (healthy food, safe environments, etc.), Sense of purpose, and Physical Health (exercise, healthy diet, medication compliance, etc.)  Goals Addressed: Patient will:  Reduce symptoms of: agitation, anxiety, depression, insomnia, and stress   Increase knowledge and/or ability of: coping skills, healthy habits, self-management skills, and stress reduction   Demonstrate ability to: Increase healthy adjustment to current life circumstances  Progress towards Goals: Ongoing  Interventions: Interventions utilized:  CBT Cognitive Behavioral Therapywas utilized by the clinician during today's follow up session. Clinician met with patient to identify needs related to stressors and functioning, and assess and monitor for signs and symptoms of anxiety and depression, and assess safety. The clinician processed with the patient how they have been doing since the last follow-up session. Clinician measured the patient's anxiety and depression on a numerical scale. Clinician provided the patient with Psychoeducation regarding medicaid. Clinician provided the patient a safe judgement free space to vent her frustrations regarding her current life circumstances.  Standardized Assessments completed: GAD-7 and PHQ 9 GAD-7= 17 PHQ-9= 18  Assessment: Patient currently experiencing see above.   Patient may benefit from see above.  Plan: Follow up with behavioral health clinician on : Thursday, October 26 at 5:00 PM Behavioral recommendations:  Referral(s): [place] (In Clinic) "From scale of 1-10, how likely are you to follow plan?":   [name], LCSWA

## 2022-04-18 ENCOUNTER — Ambulatory Visit: Payer: Medicaid Other | Admitting: Student in an Organized Health Care Education/Training Program

## 2022-04-20 ENCOUNTER — Other Ambulatory Visit: Payer: Self-pay

## 2022-04-20 ENCOUNTER — Other Ambulatory Visit: Payer: Self-pay | Admitting: Gerontology

## 2022-04-20 DIAGNOSIS — Z8659 Personal history of other mental and behavioral disorders: Secondary | ICD-10-CM

## 2022-04-22 ENCOUNTER — Other Ambulatory Visit: Payer: Self-pay | Admitting: Gerontology

## 2022-04-22 ENCOUNTER — Other Ambulatory Visit: Payer: Self-pay

## 2022-04-22 DIAGNOSIS — Z8659 Personal history of other mental and behavioral disorders: Secondary | ICD-10-CM

## 2022-04-23 ENCOUNTER — Other Ambulatory Visit: Payer: Self-pay

## 2022-04-24 ENCOUNTER — Ambulatory Visit: Payer: Medicaid Other | Admitting: Gerontology

## 2022-04-24 ENCOUNTER — Other Ambulatory Visit: Payer: Self-pay

## 2022-04-26 ENCOUNTER — Encounter: Payer: Self-pay | Admitting: Licensed Clinical Social Worker

## 2022-04-26 ENCOUNTER — Telehealth: Payer: Self-pay | Admitting: Licensed Clinical Social Worker

## 2022-04-26 ENCOUNTER — Ambulatory Visit: Payer: Self-pay | Admitting: Licensed Clinical Social Worker

## 2022-04-26 NOTE — Telephone Encounter (Signed)
I have  spoke with Nicole [name] and her PFT has been rescheduled on 05/10/22 @ 1:00pm at [facility]

## 2022-04-26 NOTE — Telephone Encounter (Signed)
Called the patient during today's scheduled appointment; no answer, left a voicemail with the clinic contact information so they may reschedule.   

## 2022-04-26 NOTE — Progress Notes (Signed)
This encounter was created in error - please disregard.

## 2022-05-10 ENCOUNTER — Other Ambulatory Visit: Payer: Self-pay

## 2022-05-10 ENCOUNTER — Other Ambulatory Visit: Payer: Self-pay | Admitting: Emergency Medicine

## 2022-05-10 ENCOUNTER — Ambulatory Visit: Payer: Medicaid Other

## 2022-05-10 ENCOUNTER — Other Ambulatory Visit: Payer: Self-pay | Admitting: Gerontology

## 2022-05-10 DIAGNOSIS — I10 Essential (primary) hypertension: Secondary | ICD-10-CM

## 2022-05-10 DIAGNOSIS — Z8659 Personal history of other mental and behavioral disorders: Secondary | ICD-10-CM

## 2022-05-10 MED ORDER — QUETIAPINE FUMARATE 100 MG PO TABS
100.0000 mg | ORAL_TABLET | Freq: Every day | ORAL | 0 refills | Status: DC
  Filled 2022-05-10: qty 7, 7d supply, fill #0

## 2022-05-10 MED ORDER — LISINOPRIL 40 MG PO TABS
40.0000 mg | ORAL_TABLET | Freq: Every day | ORAL | 0 refills | Status: DC
  Filled 2022-05-10: qty 7, 7d supply, fill #0

## 2022-05-10 MED ORDER — HYDRALAZINE HCL 50 MG PO TABS
50.0000 mg | ORAL_TABLET | Freq: Three times a day (TID) | ORAL | 0 refills | Status: DC
  Filled 2022-05-10: qty 21, 7d supply, fill #0

## 2022-05-10 MED FILL — Hydralazine HCl Tab 50 MG: ORAL | 7 days supply | Qty: 21 | Fill #0 | Status: AC

## 2022-05-10 MED FILL — Lisinopril Tab 40 MG: ORAL | 7 days supply | Qty: 7 | Fill #0 | Status: AC

## 2022-05-10 MED FILL — Quetiapine Fumarate Tab 100 MG: ORAL | 7 days supply | Qty: 7 | Fill #0 | Status: AC

## 2022-05-10 NOTE — Telephone Encounter (Signed)
Patient called in for refills of Lisinopril, Hydralazine and Seroquel. Patient has not shown for several appointments with Lanora Manis, NP and Herbert Seta, LCSW. Scheduled patient appointments with both providers on 05/16/22 @11  and 12:30. Spoke with , will refill meds for 7 days only. Patient can not get any more refills if she does not keep this appointment. Advised patient of above, she agreed and voiced understanding. Refills sent to [facility].

## 2022-05-11 ENCOUNTER — Other Ambulatory Visit: Payer: Self-pay

## 2022-05-15 ENCOUNTER — Ambulatory Visit: Payer: Medicaid Other | Admitting: Student in an Organized Health Care Education/Training Program

## 2022-05-16 ENCOUNTER — Ambulatory Visit: Payer: Self-pay | Admitting: Licensed Clinical Social Worker

## 2022-05-16 ENCOUNTER — Other Ambulatory Visit: Payer: Self-pay

## 2022-05-16 ENCOUNTER — Ambulatory Visit: Payer: Medicaid Other | Admitting: Gerontology

## 2022-05-16 VITALS — BP 156/92 | HR 116 | Wt 107.3 lb

## 2022-05-16 DIAGNOSIS — F3160 Bipolar disorder, current episode mixed, unspecified: Secondary | ICD-10-CM

## 2022-05-16 DIAGNOSIS — I10 Essential (primary) hypertension: Secondary | ICD-10-CM

## 2022-05-16 DIAGNOSIS — R000 Tachycardia, unspecified: Secondary | ICD-10-CM

## 2022-05-16 DIAGNOSIS — F17200 Nicotine dependence, unspecified, uncomplicated: Secondary | ICD-10-CM

## 2022-05-16 MED ORDER — LISINOPRIL 40 MG PO TABS
40.0000 mg | ORAL_TABLET | Freq: Every day | ORAL | 0 refills | Status: DC
  Filled 2022-05-16: qty 60, 60d supply, fill #0

## 2022-05-16 MED ORDER — HYDRALAZINE HCL 50 MG PO TABS
50.0000 mg | ORAL_TABLET | Freq: Three times a day (TID) | ORAL | 1 refills | Status: DC
  Filled 2022-05-16: qty 90, 30d supply, fill #0
  Filled 2022-06-12 (×2): qty 90, 30d supply, fill #1

## 2022-05-16 NOTE — Patient Instructions (Signed)
DASH Eating Plan DASH stands for Dietary Approaches to Stop Hypertension. The DASH eating plan is a healthy eating plan that has been shown to: Reduce high blood pressure (hypertension). Reduce your risk for type 2 diabetes, heart disease, and stroke. Help with weight loss. What are tips for following this plan? Reading food labels Check food labels for the amount of salt (sodium) per serving. Choose foods with less than 5 percent of the Daily Value of sodium. Generally, foods with less than 300 milligrams (mg) of sodium per serving fit into this eating plan. To find whole grains, look for the word "whole" as the first word in the ingredient list. Shopping Buy products labeled as "low-sodium" or "no salt added." Buy fresh foods. Avoid canned foods and pre-made or frozen meals. Cooking Avoid adding salt when cooking. Use salt-free seasonings or herbs instead of table salt or sea salt. Check with your health care provider or pharmacist before using salt substitutes. Do not fry foods. Cook foods using healthy methods such as baking, boiling, grilling, roasting, and broiling instead. Cook with heart-healthy oils, such as olive, canola, avocado, soybean, or sunflower oil. Meal planning  Eat a balanced diet that includes: 4 or more servings of fruits and 4 or more servings of vegetables each day. Try to fill one-half of your plate with fruits and vegetables. 6-8 servings of whole grains each day. Less than 6 oz (170 g) of lean meat, poultry, or fish each day. A 3-oz (85-g) serving of meat is about the same size as a deck of cards. One egg equals 1 oz (28 g). 2-3 servings of low-fat dairy each day. One serving is 1 cup (237 mL). 1 serving of nuts, seeds, or beans 5 times each week. 2-3 servings of heart-healthy fats. Healthy fats called omega-3 fatty acids are found in foods such as walnuts, flaxseeds, fortified milks, and eggs. These fats are also found in cold-water fish, such as sardines, salmon,  and mackerel. Limit how much you eat of: Canned or prepackaged foods. Food that is high in trans fat, such as some fried foods. Food that is high in saturated fat, such as fatty meat. Desserts and other sweets, sugary drinks, and other foods with added sugar. Full-fat dairy products. Do not salt foods before eating. Do not eat more than 4 egg yolks a week. Try to eat at least 2 vegetarian meals a week. Eat more home-cooked food and less restaurant, buffet, and fast food. Lifestyle When eating at a restaurant, ask that your food be prepared with less salt or no salt, if possible. If you drink alcohol: Limit how much you use to: 0-1 drink a day for women who are not pregnant. 0-2 drinks a day for men. Be aware of how much alcohol is in your drink. In the [place] one drink equals one 12 oz bottle of beer (355 mL), one 5 oz glass of wine (148 mL), or one 1 oz glass of hard liquor (44 mL). General information Avoid eating more than 2,300 mg of salt a day. If you have hypertension, you may need to reduce your sodium intake to 1,500 mg a day. Work with your health care provider to maintain a healthy body weight or to lose weight. Ask what an ideal weight is for you. Get at least 30 minutes of exercise that causes your heart to beat faster (aerobic exercise) most days of the week. Activities may include walking, swimming, or biking. Work with your health care provider or dietitian to   adjust your eating plan to your individual calorie needs. What foods should I eat? Fruits All fresh, dried, or frozen fruit. Canned fruit in natural juice (without added sugar). Vegetables Fresh or frozen vegetables (raw, steamed, roasted, or grilled). Low-sodium or reduced-sodium tomato and vegetable juice. Low-sodium or reduced-sodium tomato sauce and tomato paste. Low-sodium or reduced-sodium canned vegetables. Grains Whole-grain or whole-wheat bread. Whole-grain or whole-wheat pasta. Brown rice. Oatmeal. Quinoa.  Bulgur. Whole-grain and low-sodium cereals. Pita bread. Low-fat, low-sodium crackers. Whole-wheat flour tortillas. Meats and other proteins Skinless chicken or turkey. Ground chicken or turkey. Pork with fat trimmed off. Fish and seafood. Egg whites. Dried beans, peas, or lentils. Unsalted nuts, nut butters, and seeds. Unsalted canned beans. Lean cuts of beef with fat trimmed off. Low-sodium, lean precooked or cured meat, such as sausages or meat loaves. Dairy Low-fat (1%) or fat-free (skim) milk. Reduced-fat, low-fat, or fat-free cheeses. Nonfat, low-sodium ricotta or cottage cheese. Low-fat or nonfat yogurt. Low-fat, low-sodium cheese. Fats and oils Soft margarine without trans fats. Vegetable oil. Reduced-fat, low-fat, or light mayonnaise and salad dressings (reduced-sodium). Canola, safflower, olive, avocado, soybean, and sunflower oils. Avocado. Seasonings and condiments Herbs. Spices. Seasoning mixes without salt. Other foods Unsalted popcorn and pretzels. Fat-free sweets. The items listed above may not be a complete list of foods and beverages you can eat. Contact a dietitian for more information. What foods should I avoid? Fruits Canned fruit in a light or heavy syrup. Fried fruit. Fruit in cream or butter sauce. Vegetables Creamed or fried vegetables. Vegetables in a cheese sauce. Regular canned vegetables (not low-sodium or reduced-sodium). Regular canned tomato sauce and paste (not low-sodium or reduced-sodium). Regular tomato and vegetable juice (not low-sodium or reduced-sodium). Pickles. Olives. Grains Baked goods made with fat, such as croissants, muffins, or some breads. Dry pasta or rice meal packs. Meats and other proteins Fatty cuts of meat. Ribs. Fried meat. Bacon. Bologna, salami, and other precooked or cured meats, such as sausages or meat loaves. Fat from the back of a pig (fatback). Bratwurst. Salted nuts and seeds. Canned beans with added salt. Canned or smoked fish.  Whole eggs or egg yolks. Chicken or turkey with skin. Dairy Whole or 2% milk, cream, and half-and-half. Whole or full-fat cream cheese. Whole-fat or sweetened yogurt. Full-fat cheese. Nondairy creamers. Whipped toppings. Processed cheese and cheese spreads. Fats and oils Butter. Stick margarine. Lard. Shortening. Ghee. Bacon fat. Tropical oils, such as coconut, palm kernel, or palm oil. Seasonings and condiments Onion salt, garlic salt, seasoned salt, table salt, and sea salt. Worcestershire sauce. Tartar sauce. Barbecue sauce. Teriyaki sauce. Soy sauce, including reduced-sodium. Steak sauce. Canned and packaged gravies. Fish sauce. Oyster sauce. Cocktail sauce. Store-bought horseradish. Ketchup. Mustard. Meat flavorings and tenderizers. Bouillon cubes. Hot sauces. Pre-made or packaged marinades. Pre-made or packaged taco seasonings. Relishes. Regular salad dressings. Other foods Salted popcorn and pretzels. The items listed above may not be a complete list of foods and beverages you should avoid. Contact a dietitian for more information. Where to find more information National Heart, Lung, and Blood Institute: www.nhlbi.nih.gov American Heart Association: www.heart.org Academy of Nutrition and Dietetics: www.eatright.org National Kidney Foundation: www.kidney.org Summary The DASH eating plan is a healthy eating plan that has been shown to reduce high blood pressure (hypertension). It may also reduce your risk for type 2 diabetes, heart disease, and stroke. When on the DASH eating plan, aim to eat more fresh fruits and vegetables, whole grains, lean proteins, low-fat dairy, and heart-healthy fats. With the DASH   eating plan, you should limit salt (sodium) intake to 2,300 mg a day. If you have hypertension, you may need to reduce your sodium intake to 1,500 mg a day. Work with your health care provider or dietitian to adjust your eating plan to your individual calorie needs. This information is not  intended to replace advice given to you by your health care provider. Make sure you discuss any questions you have with your health care provider. Document Revised: 05/22/2019 Document Reviewed: 05/22/2019 Elsevier Patient Education  2023 Elsevier Inc. Smoking Tobacco Information, Adult Smoking tobacco can be harmful to your health. Tobacco contains a toxic colorless chemical called nicotine. Nicotine causes changes in your brain that make you want more and more. This is called addiction. This can make it hard to stop smoking once you start. Tobacco also has other toxic chemicals that can hurt your body and raise your risk of many cancers. Menthol or "lite" tobacco or cigarette brands are not safer than regular brands. How can smoking tobacco affect me? Smoking tobacco puts you at risk for: Cancer. Smoking is most commonly associated with lung cancer, but can also lead to cancer in other parts of the body. Chronic obstructive pulmonary disease (COPD). This is a long-term lung condition that makes it hard to breathe. It also gets worse over time. High blood pressure (hypertension), heart disease, stroke, heart attack, and lung infections, such as pneumonia. Cataracts. This is when the lenses in the eyes become clouded. Digestive problems. This may include peptic ulcers, heartburn, and gastroesophageal reflux disease (GERD). Oral health problems, such as gum disease, mouth sores, and tooth loss. Loss of taste and smell. Smoking also affects how you look and smell. Smoking may cause: Wrinkles. Yellow or stained teeth, fingers, and fingernails. Bad breath. Bad-smelling clothes and hair. Smoking tobacco can also affect your social life, because: It may be challenging to find places to smoke when away from home. Many workplaces, restaurants, hotels, and public places are tobacco-free. Smoking is expensive. This is due to the cost of tobacco and the long-term costs of treating health problems from  smoking. Secondhand smoke may affect those around you. Secondhand smoke can cause lung cancer, breathing problems, and heart disease. Children of smokers have a higher risk for: Sudden infant death syndrome (SIDS). Ear infections. Lung infections. What actions can I take to prevent health problems? Quit smoking  Do not start smoking. Quit if you already smoke. Do not replace cigarette smoking with vaping devices, such as e-cigarettes. Make a plan to quit smoking and commit to it. Look for programs to help you, and ask your health care provider for recommendations and ideas. Set a date and write down all the reasons you want to quit. Let your friends and family know you are quitting so they can help and support you. Consider finding friends who also want to quit. It can be easier to quit with someone else, so that you can support each other. Talk with your health care provider about using nicotine replacement medicines to help you quit. These include gum, lozenges, patches, sprays, or pills. If you try to quit but return to smoking, stay positive. It is common to slip up when you first quit, so take it one day at a time. Be prepared for cravings. When you feel the urge to smoke, chew gum or suck on hard candy. Lifestyle Stay busy. Take care of your body. Get plenty of exercise, eat a healthy diet, and drink plenty of water. Find ways   to manage your stress, such as meditation, yoga, exercise, or time spent with friends and family. Ask your health care provider about having regular tests (screenings) to check for cancer. This may include blood tests, imaging tests, and other tests. Where to find support To get support to quit smoking, consider: Asking your health care provider for more information and resources. Joining a support group for people who want to quit smoking in your local community. There are many effective programs that may help you to quit. Calling the smokefree.gov counselor  helpline at 1-800-QUIT-NOW (1-800-784-8669). Where to find more information You may find more information about quitting smoking from: Centers for Disease Control and Prevention: cdc.gov/tobacco Smokefree.gov: smokefree.gov American Lung Association: freedomfromsmoking.org Contact a health care provider if: You have problems breathing. Your lips, nose, or fingers turn blue. You have chest pain. You are coughing up blood. You feel like you will faint. You have other health changes that cause you to worry. Summary Smoking tobacco can negatively affect your health, the health of those around you, your finances, and your social life. Do not start smoking. Quit if you already smoke. If you need help quitting, ask your health care provider. Consider joining a support group for people in your local community who want to quit smoking. There are many effective programs that may help you to quit. This information is not intended to replace advice given to you by your health care provider. Make sure you discuss any questions you have with your health care provider. Document Revised: 06/13/2021 Document Reviewed: 06/13/2021 Elsevier Patient Education  2023 Elsevier Inc.  

## 2022-05-16 NOTE — BH Specialist Note (Signed)
Integrated Behavioral Health Follow Up In-Person Visit  MRN: [id number] Name: Nicole [name] [facility]  Number of [place] Clinician visits: No data recorded Session Start time: No data recorded  Session End time: No data recorded Total time in minutes: No data recorded  Types of Service: Individual psychotherapy  Interpretor:No. Interpretor Name and Language:    Subjective: Nicole [name] is a 61 y.o. female accompanied by  daughter Patient was referred by Carlyon Shadow, NP for mental health. Patient reports the following symptoms/concerns: The patient reports that she has been doing the same since her last follow-up appointment. She explained that she received a Medicaid packet in the mail and was unsure what it meant. The patient noted that she wanted to move out of her home because she felt her husband was not supporting her as the woman of the home and was letting their son's girlfriend disrespect her. The patient noted that her daughter was going to help support her during this transition. The patient shared that she was out of Seroquel and wanted the clinician to request her primary care doctor refill her prescription. The patient denied any suicidal or homicidal thoughts.  Duration of problem: years; Severity of problem: moderate  Objective: Mood: Anxious and Affect: Appropriate Risk of harm to self or others: No plan to harm self or others  Life Context: Family and Social: see above School/Work: see above Self-Care: see above Life Changes: see above  Patient and/or Family's Strengths/Protective Factors: Social connections, Sense of purpose, Physical Health (exercise, healthy diet, medication compliance, etc.), and Caregiver has knowledge of parenting & child development  Goals Addressed: Patient will:  Reduce symptoms of: agitation, anxiety, depression, insomnia, mood instability, and stress   Increase knowledge and/or ability of: coping skills,  healthy habits, self-management skills, and stress reduction   Demonstrate ability to: Increase healthy adjustment to current life circumstances and Increase adequate support systems for patient/family  Progress towards Goals: Revised  Interventions: Interventions utilized:  CBT Cognitive Behavioral Therapywas utilized by the clinician during today's follow up session. Clinician met with patient to identify needs related to stressors and functioning, and assess and monitor for signs and symptoms of anxiety and depression, and assess safety. The clinician processed with the patient how they have been doing since the last follow-up session.  Clinician measured the patient's anxiety and depression on a numerical scale.  Clinician provided a safe judgment free space for the patient to vent her frustrations regarding her current life circumstances.  The patient was provided with a referral to [place].  Further, clinician provided the patient with referrals to crisis resources such as 988, via crisis line, [facility] crisis line, and was instructed to go to the nearest emergency or dial 911 should she experience a mental health or substance use crisis.  Clinician processed with the patient her progress made in therapy so far and provided space and answered questions concerning transitioning care to another provider.  Clinician offered to and messaged the patient's primary care provider Carlyon Shadow, NP regarding the patient's request for a refill of Seroquel. Standardized Assessments completed: GAD-7 and PHQ 9 GAD-7=17 PHQ-9=18   Assessment: Patient currently experiencing see above.   Patient may benefit from see above.  Plan: Follow up with behavioral health clinician on :  Behavioral recommendations:  Referral(s): [place] (LME/Outside Clinic) The patient has Medicaid and was referred to [facility] "From scale of 1-10, how likely are you to follow plan?":   [name], LCSWA

## 2022-05-16 NOTE — Progress Notes (Signed)
Established Patient Office Visit  Subjective   Patient ID: Clementina Mareno, female    DOB: June 03, 1961  Age: 61 y.o. MRN: [id number]   Chief Complaint: Follow-up  HPI  Didi Ganaway Curenton is a 61 y/o female who has history of bipolar, COPD, hypertension, asthma, anxiety, collagen vascular disease, hepatitis C , substance use disorder, and alcohol abuse. Her blood pressure was elevated times two today in clinic, 188/106 mmHg and 156/92 mmHg. Her labs drawn on 03/08/22 showed a decrease in her serum Cr to 0.85 mg/dL and eGFR 79. She states she has been cutting down on her alcohol use (she was drinking a pint of liquor a day and is now drinking two wine coolers a day). She continues to smoke 1 pack of cigarettes a day. She is not currently using any nicotine replacement products. She offers no further complaints.   Review of Systems  Constitutional: Negative.   Eyes: Negative.   Respiratory:  Positive for cough, sputum production, shortness of breath and wheezing.   Cardiovascular: Negative.   Gastrointestinal: Negative.   Genitourinary: Negative.   Neurological:  Positive for tremors (bilateral hands).  Endo/Heme/Allergies: Negative.   Psychiatric/Behavioral: Negative.        Objective:     There were no vitals taken for this visit. BP Readings from Last 3 Encounters:  03/08/22 (!) 174/99  02/19/22 (!) 150/98  02/01/22 126/83   Wt Readings from Last 3 Encounters:  03/08/22 113 lb 11.2 oz (51.6 kg)  02/19/22 110 lb (49.9 kg)  02/01/22 105 lb 9.6 oz (47.9 kg)      Physical Exam Constitutional:      Appearance: Normal appearance.  HENT:     Head: Normocephalic and atraumatic.  Cardiovascular:     Rate and Rhythm: Regular rhythm. Tachycardia present.     Pulses: Normal pulses.     Heart sounds: Normal heart sounds.  Pulmonary:     Breath sounds: Rhonchi present.     Comments: Coarse lung sounds Skin:    Capillary Refill: Capillary refill takes less than 2  seconds.  Neurological:     General: No focal deficit present.     Mental Status: She is alert and oriented to person, place, and time. Mental status is at baseline.  Psychiatric:        Mood and Affect: Mood is anxious.        Speech: Speech is rapid and pressured.        Behavior: Behavior is hyperactive.      No results found for any visits on 05/16/22.  Last CBC Lab Results  Component Value Date   WBC 6.0 03/08/2022   HGB 13.0 03/08/2022   HCT 37.2 03/08/2022   MCV 100 (H) 03/08/2022   MCH 34.9 (H) 03/08/2022   RDW 11.2 (L) 03/08/2022   PLT 182 40/98/1191   Last metabolic panel Lab Results  Component Value Date   GLUCOSE 79 03/08/2022   NA 136 03/08/2022   K 4.3 03/08/2022   CL 99 03/08/2022   CO2 25 03/08/2022   BUN 20 03/08/2022   CREATININE 0.85 03/08/2022   EGFR 79 03/08/2022   CALCIUM 9.8 03/08/2022   PHOS 2.5 01/28/2022   PROT 6.9 03/08/2022   ALBUMIN 3.6 (L) 03/08/2022   LABGLOB 3.3 03/08/2022   AGRATIO 1.1 (L) 03/08/2022   BILITOT 0.3 03/08/2022   ALKPHOS 111 03/08/2022   AST 59 (H) 03/08/2022   ALT 37 (H) 03/08/2022   ANIONGAP 8 01/30/2022  Last lipids Lab Results  Component Value Date   CHOL 151 10/03/2021   HDL 77 10/03/2021   LDLCALC 57 10/03/2021   TRIG 90 10/03/2021   CHOLHDL 2.0 10/03/2021      The 10-year ASCVD risk score (Arnett DK, et al., 2019) is: 9.7%    Assessment & Plan:   1. Primary hypertension - Her blood pressure is not controlled. Her goal should be less than 140/90 mmHg. She should continue to take her anti-hypertensive medications, DASH diet, and exercise as tolerated. She should report to the emergency department if she begins having chest pain or a severe headache.   - hydrALAZINE (APRESOLINE) 50 MG tablet; Take 1 tablet (50 mg total) by mouth 3 (three) times daily for 30 doses.  Dispense: 90 tablet; Refill: 1 - lisinopril (ZESTRIL) 40 MG tablet; Take 1 tablet (40 mg total) by mouth daily.  Dispense: 60 tablet;  Refill: 0  2. Smoking - She was educated on smoking cessation. She should not use nicotine replacement therapy products while smoking 1 pack per day. [place] Quitline information given.  3. Tachycardia - She was encouraged to drink more water and eat more frequent meals. She was educated on coping mechanisms for her life stressors, other than drinking alcohol or smoking cigarettes.  She was advised to call Crisis help line for worsening symptoms and go to the ED for chest pain or palpitation.   No follow-up as she now has active insurance. [facility] wishes her well.    Rayvon Char, FNP Student

## 2022-05-29 ENCOUNTER — Encounter: Payer: Self-pay | Admitting: *Deleted

## 2022-05-29 ENCOUNTER — Emergency Department
Admission: EM | Admit: 2022-05-29 | Discharge: 2022-05-30 | Disposition: A | Discharge status: Home / Self Care | Payer: Medicaid Other | Attending: Emergency Medicine | Admitting: Emergency Medicine

## 2022-05-29 ENCOUNTER — Other Ambulatory Visit: Payer: Self-pay

## 2022-05-29 DIAGNOSIS — I10 Essential (primary) hypertension: Secondary | ICD-10-CM | POA: Diagnosis present

## 2022-05-29 DIAGNOSIS — Z8679 Personal history of other diseases of the circulatory system: Secondary | ICD-10-CM

## 2022-05-29 DIAGNOSIS — Y908 Blood alcohol level of 240 mg/100 ml or more: Secondary | ICD-10-CM | POA: Insufficient documentation

## 2022-05-29 DIAGNOSIS — F10939 Alcohol use, unspecified with withdrawal, unspecified: Secondary | ICD-10-CM | POA: Diagnosis present

## 2022-05-29 DIAGNOSIS — Z1152 Encounter for screening for COVID-19: Secondary | ICD-10-CM | POA: Insufficient documentation

## 2022-05-29 DIAGNOSIS — F17200 Nicotine dependence, unspecified, uncomplicated: Secondary | ICD-10-CM | POA: Diagnosis present

## 2022-05-29 DIAGNOSIS — G8929 Other chronic pain: Secondary | ICD-10-CM | POA: Diagnosis present

## 2022-05-29 DIAGNOSIS — R4689 Other symptoms and signs involving appearance and behavior: Secondary | ICD-10-CM

## 2022-05-29 DIAGNOSIS — IMO0001 Reserved for inherently not codable concepts without codable children: Secondary | ICD-10-CM | POA: Diagnosis present

## 2022-05-29 DIAGNOSIS — F411 Generalized anxiety disorder: Secondary | ICD-10-CM | POA: Diagnosis present

## 2022-05-29 DIAGNOSIS — F10129 Alcohol abuse with intoxication, unspecified: Secondary | ICD-10-CM | POA: Insufficient documentation

## 2022-05-29 DIAGNOSIS — Z8659 Personal history of other mental and behavioral disorders: Secondary | ICD-10-CM

## 2022-05-29 DIAGNOSIS — F1010 Alcohol abuse, uncomplicated: Secondary | ICD-10-CM | POA: Diagnosis present

## 2022-05-29 DIAGNOSIS — J449 Chronic obstructive pulmonary disease, unspecified: Secondary | ICD-10-CM | POA: Insufficient documentation

## 2022-05-29 DIAGNOSIS — F1090 Alcohol use, unspecified, uncomplicated: Secondary | ICD-10-CM | POA: Diagnosis present

## 2022-05-29 DIAGNOSIS — H9190 Unspecified hearing loss, unspecified ear: Secondary | ICD-10-CM

## 2022-05-29 LAB — COMPREHENSIVE METABOLIC PANEL
ALT: 45 U/L — ABNORMAL HIGH (ref 0–44)
AST: 71 U/L — ABNORMAL HIGH (ref 15–41)
Albumin: 4.3 g/dL (ref 3.5–5.0)
Alkaline Phosphatase: 94 U/L (ref 38–126)
Anion gap: 10 (ref 5–15)
BUN: 15 mg/dL (ref 6–20)
CO2: 24 mmol/L (ref 22–32)
Calcium: 10.1 mg/dL (ref 8.9–10.3)
Chloride: 112 mmol/L — ABNORMAL HIGH (ref 98–111)
Creatinine, Ser: 0.94 mg/dL (ref 0.44–1.00)
GFR, Estimated: >60 mL/min (ref >60)
Glucose, Bld: 115 mg/dL — ABNORMAL HIGH (ref 70–99)
Potassium: 3.7 mmol/L (ref 3.5–5.1)
Sodium: 146 mmol/L — ABNORMAL HIGH (ref 135–145)
Total Bilirubin: 0.8 mg/dL (ref 0.3–1.2)
Total Protein: 9.2 g/dL — ABNORMAL HIGH (ref 6.5–8.1)

## 2022-05-29 LAB — RESP PANEL BY RT-PCR (FLU A&B, COVID) ARPGX2
Influenza A by PCR: NEGATIVE
Influenza B by PCR: NEGATIVE
SARS Coronavirus 2 by RT PCR: NEGATIVE

## 2022-05-29 LAB — ACETAMINOPHEN LEVEL: Acetaminophen (Tylenol), Serum: <10 ug/mL — ABNORMAL LOW (ref 10–30)

## 2022-05-29 LAB — CBC
HCT: 45.4 % (ref 36.0–46.0)
Hemoglobin: 15.6 g/dL — ABNORMAL HIGH (ref 12.0–15.0)
MCH: 32.6 pg (ref 26.0–34.0)
MCHC: 34.4 g/dL (ref 30.0–36.0)
MCV: 94.8 fL (ref 80.0–100.0)
Platelets: 321 10*3/uL (ref 150–400)
RBC: 4.79 MIL/uL (ref 3.87–5.11)
RDW: 13.2 % (ref 11.5–15.5)
WBC: 7.1 10*3/uL (ref 4.0–10.5)
nRBC: 0 % (ref 0.0–0.2)

## 2022-05-29 LAB — SALICYLATE LEVEL: Salicylate Lvl: <7 mg/dL — ABNORMAL LOW (ref 7.0–30.0)

## 2022-05-29 LAB — ETHANOL: Alcohol, Ethyl (B): 419 mg/dL (ref <10)

## 2022-05-29 LAB — PREGNANCY, URINE: Preg Test, Ur: NEGATIVE

## 2022-05-29 NOTE — ED Provider Notes (Signed)
[facility] Provider Note    Event Date/Time   First MD Initiated Contact with Patient 05/29/22 2009     (approximate)   History   Psychiatric Evaluation   HPI  Nicole [name] is a 61 y.o. female   Past medical history of anxiety depression bipolar, COPD and substance use hepatitis C, hypertension who presents the emergency department with IVC by police for alcohol intoxication and threatening behaviors towards family member.  Nursing also reports that EMS reported patient with self endangerment behaviors like walking across the freeway multiple times.  Patient states that she is intoxicated drink alcohol and does not use drugs and is not suicidal and did not have any self-harm behaviors today.  Has no other medical complaints.  History was obtained via the patient. EMS served as a independent historian to provide collateral information as above. Reviewed an external medical note including behavioral health note dated 04/10/2022 for anxiety depression and patient stated that she had trouble sleeping at that time.     Physical Exam   Triage Vital Signs: ED Triage Vitals  Enc Vitals Group     BP 05/29/22 1930 (!) 173/109     Pulse Rate 05/29/22 1930 (!) 54     Resp 05/29/22 1930 20     Temp 05/29/22 1930 97.7 F (36.5 C)     Temp Source 05/29/22 1930 Oral     SpO2 05/29/22 1930 100 %     Weight 05/29/22 1931 105 lb 13.1 oz (48 kg)     Height 05/29/22 1931 5\' 3"  (1.6 m)     Head Circumference --      Peak Flow --      Pain Score 05/29/22 1931 0     Pain Loc --      Pain Edu? --      Excl. in GC? --     Most recent vital signs: Vitals:   05/29/22 1930  BP: (!) 173/109  Pulse: (!) 54  Resp: 20  Temp: 97.7 F (36.5 C)  SpO2: 100%    General: Awake, peers intoxicated, slurring speech. CV:  Good peripheral perfusion.  Resp:  Normal effort.  Lungs clear to auscultation bilaterally without focality or wheezing. Abd:  No distention.   Soft and nontender, no rigidity or guarding. Other:  No signs of head trauma.   ED Results / Procedures / Treatments   Labs (all labs ordered are listed, but only abnormal results are displayed) Labs Reviewed  COMPREHENSIVE METABOLIC PANEL - Abnormal; Notable for the following components:      Result Value   Sodium 146 (*)    Chloride 112 (*)    Glucose, Bld 115 (*)    Total Protein 9.2 (*)    AST 71 (*)    ALT 45 (*)    All other components within normal limits  CBC - Abnormal; Notable for the following components:   Hemoglobin 15.6 (*)    All other components within normal limits  ETHANOL  SALICYLATE LEVEL  ACETAMINOPHEN LEVEL  URINE DRUG SCREEN, QUALITATIVE ([facility] ONLY)  POC URINE PREG, ED     I reviewed labs and they are notable for AST and ALT are elevated at 71 and 45 respectively.    PROCEDURES:  Critical Care performed: No  Procedures   MEDICATIONS ORDERED IN ED: Medications - No data to display   IMPRESSION / MDM / ASSESSMENT AND PLAN / ED COURSE  I reviewed the triage vital signs and  the nursing notes.                              Differential diagnosis includes, but is not limited to, alcohol intoxication, suicidal ideation, decompensated psychiatric illness, metabolic derangements   MDM: Patient with alcohol intoxication and threatening behavior towards family members and self endangerment behaviors reported by EMS who was IVC by police prior to arrival here.  She has no other medical complaints and appears intoxicated.  Toxicology labs and basic labs ordered.  Psychiatric evaluation.  Patient's presentation is most consistent with acute presentation with potential threat to life or bodily function.       FINAL CLINICAL IMPRESSION(S) / ED DIAGNOSES   Final diagnoses:  Alcohol abuse  Aggressive behavior     Rx / DC Orders   ED Discharge Orders     None        Note:  This document was prepared using Dragon voice recognition  software and may include unintentional dictation errors.    Lucillie Garfinkel, MD 05/29/22 2024

## 2022-05-29 NOTE — ED Triage Notes (Addendum)
Pt  brought in by [place] police.  Pt is IVC.  Pt is cursing at staff.  Per IVC papers, pt is intoxicated and threatening family members.   Pt unsteady on her feet.  Pt placed in a wheelchair and taken to room 23.  Police with pt.  Pt semi uncooperative in triage.

## 2022-05-29 NOTE — ED Notes (Signed)
Critical to Dr. Modesto Charon, no orders at this time

## 2022-05-29 NOTE — ED Notes (Signed)
Patient transferred from Triage to room 23 after partially dressing out and screening for contraband. Pt was demanding phone and informed she could use it after dressing out remainder of the way. Report received from Amy, RN including situation, background, assessment and recommendations. Pt oriented to [employer] including Q15 minute rounds as well as Psychologist, counselling for their protection. Patient is alert and oriented, warm and dry in no acute distress. Patient denies SI, HI, and AVH. Pt. Encouraged to let this nurse know if needs arise.

## 2022-05-29 NOTE — ED Notes (Signed)
Jewelry  Black jacket   [employer] Hair tie Stone ring on right middle finger will not come off Loose change Black bra These are clothes we removed in triage.  Pt refused to take jeans and shoes off.

## 2022-05-30 ENCOUNTER — Other Ambulatory Visit: Payer: Self-pay

## 2022-05-30 DIAGNOSIS — F1010 Alcohol abuse, uncomplicated: Secondary | ICD-10-CM

## 2022-05-30 LAB — URINE DRUG SCREEN, QUALITATIVE (ARMC ONLY)
Amphetamines, Ur Screen: NOT DETECTED
Barbiturates, Ur Screen: NOT DETECTED
Benzodiazepine, Ur Scrn: NOT DETECTED
Cannabinoid 50 Ng, Ur ~~LOC~~: NOT DETECTED
Cocaine Metabolite,Ur ~~LOC~~: NOT DETECTED
MDMA (Ecstasy)Ur Screen: NOT DETECTED
Methadone Scn, Ur: NOT DETECTED
Opiate, Ur Screen: NOT DETECTED
Phencyclidine (PCP) Ur S: NOT DETECTED
Tricyclic, Ur Screen: NOT DETECTED

## 2022-05-30 MED ORDER — THIAMINE HCL 100 MG PO TABS
100.0000 mg | ORAL_TABLET | Freq: Every day | ORAL | 0 refills | Status: AC
  Filled 2022-05-30: qty 30, 30d supply, fill #0

## 2022-05-30 MED ORDER — MELATONIN 5 MG PO TABS
5.0000 mg | ORAL_TABLET | Freq: Once | ORAL | Status: AC
  Administered 2022-05-30: 5 mg via ORAL

## 2022-05-30 MED ORDER — MELATONIN 5 MG PO TABS: 5.0000 mg | ORAL_TABLET | Freq: Every day | ORAL | Status: DC

## 2022-05-30 MED ORDER — HYDRALAZINE HCL 50 MG PO TABS
50.0000 mg | ORAL_TABLET | Freq: Once | ORAL | Status: AC
  Administered 2022-05-30: 50 mg via ORAL
  Filled 2022-05-30: qty 1

## 2022-05-30 MED ORDER — CHLORDIAZEPOXIDE HCL 25 MG PO CAPS
50.0000 mg | ORAL_CAPSULE | Freq: Once | ORAL | Status: AC
  Administered 2022-05-30: 50 mg via ORAL
  Filled 2022-05-30: qty 2

## 2022-05-30 MED ORDER — THIAMINE HCL 100 MG PO TABS
100.0000 mg | ORAL_TABLET | Freq: Once | ORAL | Status: AC
  Administered 2022-05-30: 100 mg via ORAL
  Filled 2022-05-30 (×2): qty 1

## 2022-05-30 MED ORDER — CHLORDIAZEPOXIDE HCL 25 MG PO CAPS
ORAL_CAPSULE | ORAL | 0 refills | Status: AC
  Filled 2022-05-30: qty 20, 4d supply, fill #0

## 2022-05-30 NOTE — ED Notes (Signed)
Husband here to pickup patient. Pt given all of belongings back. Verified correct patient and correct discharge papers given. Pt alert and oriented X 4, stable for discharge. RR even and unlabored, color WNL. Discussed discharge instructions and follow-up as directed. Discharge medications discussed, when prescribed. Pt had opportunity to ask questions, and RN available to provide patient and/or family education. Able to ambulate, drink PO without problems. Appears clinically sober.

## 2022-05-30 NOTE — ED Provider Notes (Addendum)
Patient was seen by psychiatry.  IVC has been rescinded.  They would like her to be discharged in the morning.  Patient not SI or HI.  Does drink daily.  Psychiatry has provided her with some outpatient resources.  Discussed plan with the patient.  I will start her on Librium taper and thiamine.   Georga Hacking, MD 05/30/22 1157    Georga Hacking, MD 05/30/22 [phone]

## 2022-05-30 NOTE — ED Notes (Signed)
Attempted to contact pt husband for safe ride home. No answer.

## 2022-05-30 NOTE — ED Notes (Signed)
Repeat call to husband, no answer. VM left requesting call back and ride home. Pt aware and just exited restroom

## 2022-05-30 NOTE — ED Notes (Signed)
IVC/Rescinded/ Plan to D/C

## 2022-05-30 NOTE — Discharge Instructions (Signed)
Please take the Librium to prevent alcohol withdrawal.  If you start back drinking please stop taking the Librium.  Please also start taking thiamine daily.

## 2022-05-30 NOTE — ED Notes (Signed)
Pt has been awake, pt is regularly filling water cup up and drinking water. Calm and cooperative at this time
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
## 2022-05-30 NOTE — Consult Note (Signed)
[facility] Consult   Reason for Consult: Psychiatric Evaluation   Referring Physician: Dr. Jacelyn Grip Patient Identification: Nicole [name] MRN:  [id number] Principal Diagnosis: <principal problem not specified> Diagnosis:  Active Problems:   Anxiety state   Alcohol abuse   Hypertension   Nicotine dependence   History of hypertension   History of bipolar disorder   Smoking   Hip pain, chronic, right   Hearing loss   Alcohol withdrawal ([place])   Total Time spent with patient: 1 hour  Subjective: "I had been drinking."   Nicole [name] is a 61 y.o. female patient presented to [facility] ED via law enforcement under involuntary commitment status (IVC). Per the IVC papers, the patient is intoxicated and threatening family members. The patient does share that "I am an alcoholic. I do want help." The patient shared that she has been diagnosed with bipolar, but she is not on medication because she does not have insurance. The patient shared her Medicaid is becoming active on 58.1.23.  His provider saw The patient face-to-face; the chart was reviewed, and Dr. Starleen Blue was consulted on it on 05/30/2022 due to the patient's care. It was discussed with the EDP that the patient does not meet the criteria to be admitted to the psychiatric inpatient unit. The patient's IVC will be rescinded.  Upon evaluation, the patient is alert and oriented x 4, calm, cooperative, and mood-congruent with affect. The patient does not appear to be responding to internal or external stimuli. Neither is the patient presenting with any delusional thinking. The patient denies auditory or visual hallucinations. The patient denies any suicidal, homicidal, or self-harm ideations. The patient is not presenting with any psychotic or paranoid behaviors. During an encounter with the patient, she could answer questions appropriately. The patient's BAL is 419 mg/dl. Collateral was obtained from the patient's daughter  Nicole [name], who IVC. She shared her mom had been drinking heavily, and when she drinks, she is aggressive; she is always angry and more efficient, resulting in aggressive outbursts, verbal abuse, and even violence. Nicole [name] shared that her mom's temper became out of control.  HPI: Per Dr. Jacelyn Grip, Nicole [name] is a 61 y.o. female   Past medical history of anxiety depression bipolar, COPD and substance use hepatitis C, hypertension who presents the emergency department with IVC by police for alcohol intoxication and threatening behaviors towards family member.  Nursing also reports that EMS reported patient with self endangerment behaviors like walking across the freeway multiple times.  Patient states that she is intoxicated drink alcohol and does not use drugs and is not suicidal and did not have any self-harm behaviors today. Has no other medical complaints. History was obtained via the patient. EMS served as a independent historian to provide collateral information as above. Reviewed an external medical note including behavioral health note dated 04/10/2022 for anxiety depression and patient stated that she had trouble sleeping at that time.  Past Psychiatric History:  Anxiety Asthma Bipolar disorder ([place]) Substance abuse ([place])  Risk to Self:   Risk to Others:   Prior Inpatient Therapy:   Prior Outpatient Therapy:    Past Medical History:  Past Medical History:  Diagnosis Date   Anxiety    Arthritis    Asthma    Bipolar disorder ([place])    Collagen vascular disease ([place])    COPD (chronic obstructive pulmonary disease) ([place])    Hepatitis C    Hypertension    Substance abuse ([place])     Past  Surgical History:  Procedure Laterality Date   ABDOMINAL HYSTERECTOMY     TONSILLECTOMY     TONSILLECTOMY     Family History:  Family History  Problem Relation Age of Onset   Depression Mother    Hypertension Mother    Heart attack Mother    Insomnia Mother    Thyroid disease Mother     Bipolar disorder Mother    Other Father        Parkinson's disease   Parkinson's disease Father    Arthritis Maternal Grandmother    Bipolar disorder Maternal Grandmother    Depression Maternal Grandmother    Other Maternal Grandfather        unknown medical history   Heart attack Paternal Grandmother    Ovarian cancer Paternal Grandmother    Hyperlipidemia Paternal Grandfather    Hypertension Paternal Grandfather    Congestive Heart Failure Paternal Grandfather    Rheum arthritis Other    Osteoporosis Other    Clotting disorder Other    Stroke Other    Family Psychiatric  History: Daughter- substance abuse Social History:  Social History   Substance and Sexual Activity  Alcohol Use Yes   Alcohol/week: 4.0 standard drinks of alcohol   Types: 4 Standard drinks or equivalent per week   Comment: 1 pint a day of brown liquor with pepsi     Social History   Substance and Sexual Activity  Drug Use Yes   Types: Cocaine, Marijuana   Comment: uses marijuana occassionally, last Cocaine ~ 2017    Social History   Socioeconomic History   Marital status: Married    Spouse name: Not on file   Number of children: Not on file   Years of education: Not on file   Highest education level: Not on file  Occupational History   Occupation: Housewife  Tobacco Use   Smoking status: Every Day    Packs/day: 2.00    Years: 33.00    Total pack years: 66.00    Types: Cigarettes   Smokeless tobacco: Never   Tobacco comments:    1PPD 02/19/2022, currently using nicotine patches, patient also occasionally uses daughter's vape    Currently drinking 1 wine cooler 2 times per week, no liquor since 01/30/22  Vaping Use   Vaping Use: Some days   Substances: Nicotine, Flavoring  Substance and Sexual Activity   Alcohol use: Yes    Alcohol/week: 4.0 standard drinks of alcohol    Types: 4 Standard drinks or equivalent per week    Comment: 1 pint a day of brown liquor with pepsi   Drug use: Yes     Types: Cocaine, Marijuana    Comment: uses marijuana occassionally, last Cocaine ~ 2017   Sexual activity: Not Currently  Other Topics Concern   Not on file  Social History Narrative   Not on file   Social Determinants of Health   Financial Resource Strain: Not on file  Food Insecurity: No Food Insecurity (10/03/2021)   Hunger Vital Sign    Worried About Running Out of Food in the Last Year: Never true    Ran Out of Food in the Last Year: Never true  Transportation Needs: No Transportation Needs (10/03/2021)   PRAPARE - Hydrologist (Medical): No    Lack of Transportation (Non-Medical): No  Physical Activity: Not on file  Stress: Not on file  Social Connections: Not on file   Additional Social History:    Allergies:  No Known Allergies  Labs:  Results for orders placed or performed during the hospital encounter of 05/29/22 (from the past 48 hour(s))  Comprehensive metabolic panel     Status: Abnormal   Collection Time: 05/29/22  7:34 PM  Result Value Ref Range   Sodium 146 (H) 135 - 145 mmol/L   Potassium 3.7 3.5 - 5.1 mmol/L   Chloride 112 (H) 98 - 111 mmol/L   CO2 24 22 - 32 mmol/L   Glucose, Bld 115 (H) 70 - 99 mg/dL    Comment: Glucose reference range applies only to samples taken after fasting for at least 8 hours.   BUN 15 6 - 20 mg/dL   Creatinine, Ser 0.94 0.44 - 1.00 mg/dL   Calcium 10.1 8.9 - 10.3 mg/dL   Total Protein 9.2 (H) 6.5 - 8.1 g/dL   Albumin 4.3 3.5 - 5.0 g/dL   AST 71 (H) 15 - 41 U/L   ALT 45 (H) 0 - 44 U/L   Alkaline Phosphatase 94 38 - 126 U/L   Total Bilirubin 0.8 0.3 - 1.2 mg/dL   GFR, Estimated >60 >60 mL/min    Comment: (NOTE) Calculated using the CKD-EPI Creatinine Equation (2021)    Anion gap 10 5 - 15    Comment: Performed at [facility], [address] [place], [place] [address]  Ethanol     Status: Abnormal   Collection Time: 05/29/22  7:34 PM  Result Value Ref Range   Alcohol, Ethyl (B) 419  (HH) <10 mg/dL    Comment: CRITICAL RESULT CALLED TO, READ BACK BY AND VERIFIED WITH CHRIS BUCKNER 05/29/22 2038 MU (NOTE) Lowest detectable limit for serum alcohol is 10 mg/dL.  For medical purposes only. Performed at [facility], [address] [place], [place] [address]   Salicylate level     Status: Abnormal   Collection Time: 05/29/22  7:34 PM  Result Value Ref Range   Salicylate Lvl <5.0 (L) 7.0 - 30.0 mg/dL    Comment: Performed at [facility], [address] [place], [place] [address]  Acetaminophen level     Status: Abnormal   Collection Time: 05/29/22  7:34 PM  Result Value Ref Range   Acetaminophen (Tylenol), Serum <10 (L) 10 - 30 ug/mL    Comment: (NOTE) Therapeutic concentrations vary significantly. A range of 10-30 ug/mL  may be an effective concentration for many patients. However, some  are best treated at concentrations outside of this range. Acetaminophen concentrations >150 ug/mL at 4 hours after ingestion  and >50 ug/mL at 12 hours after ingestion are often associated with  toxic reactions.  Performed at [facility], [address] [place],  [address]   cbc     Status: Abnormal   Collection Time: 05/29/22  7:34 PM  Result Value Ref Range   WBC 7.1 4.0 - 10.5 K/uL   RBC 4.79 3.87 - 5.11 MIL/uL   Hemoglobin 15.6 (H) 12.0 - 15.0 g/dL   HCT 45.4 36.0 - 46.0 %   MCV 94.8 80.0 - 100.0 fL   MCH 32.6 26.0 - 34.0 pg   MCHC 34.4 30.0 - 36.0 g/dL   RDW 13.2 11.5 - 15.5 %   Platelets 321 150 - 400 K/uL   nRBC 0.0 0.0 - 0.2 %    Comment: Performed at [facility], [address] [place],  [address]  Resp Panel by RT-PCR (Flu A&B, Covid) Anterior Nasal Swab     Status: None  Collection Time: 05/29/22  8:28 PM   Specimen: Anterior Nasal Swab  Result Value Ref Range   SARS Coronavirus 2 by RT PCR NEGATIVE NEGATIVE    Comment: (NOTE) SARS-CoV-2 target nucleic acids are NOT DETECTED.  The SARS-CoV-2  RNA is generally detectable in upper respiratory specimens during the acute phase of infection. The lowest concentration of SARS-CoV-2 viral copies this assay can detect is 138 copies/mL. A negative result does not preclude SARS-Cov-2 infection and should not be used as the sole basis for treatment or other patient management decisions. A negative result may occur with  improper specimen collection/handling, submission of specimen other than nasopharyngeal swab, presence of viral mutation(s) within the areas targeted by this assay, and inadequate number of viral copies(<138 copies/mL). A negative result must be combined with clinical observations, patient history, and epidemiological information. The expected result is Negative.  Fact Sheet for Patients:  EntrepreneurPulse.com.au  Fact Sheet for Healthcare Providers:  IncredibleEmployment.be  This test is no t yet approved or cleared by the [place] FDA and  has been authorized for detection and/or diagnosis of SARS-CoV-2 by FDA under an Emergency Use Authorization (EUA). This EUA will remain  in effect (meaning this test can be used) for the duration of the COVID-19 declaration under Section 564(b)(1) of the Act, 21 U.S.C.section 360bbb-3(b)(1), unless the authorization is terminated  or revoked sooner.       Influenza A by PCR NEGATIVE NEGATIVE   Influenza B by PCR NEGATIVE NEGATIVE    Comment: (NOTE) The Xpert Xpress SARS-CoV-2/FLU/RSV plus assay is intended as an aid in the diagnosis of influenza from Nasopharyngeal swab specimens and should not be used as a sole basis for treatment. Nasal washings and aspirates are unacceptable for Xpert Xpress SARS-CoV-2/FLU/RSV testing.  Fact Sheet for Patients: EntrepreneurPulse.com.au  Fact Sheet for Healthcare Providers: IncredibleEmployment.be  This test is not yet approved or cleared by the [place]  FDA and has been authorized for detection and/or diagnosis of SARS-CoV-2 by FDA under an Emergency Use Authorization (EUA). This EUA will remain in effect (meaning this test can be used) for the duration of the COVID-19 declaration under Section 564(b)(1) of the Act, 21 U.S.C. section 360bbb-3(b)(1), unless the authorization is terminated or revoked.  Performed at [facility], [address] [place], [place] [address]   Urine Drug Screen, Qualitative     Status: None   Collection Time: 05/29/22 11:40 PM  Result Value Ref Range   Tricyclic, Ur Screen NONE DETECTED NONE DETECTED   Amphetamines, Ur Screen NONE DETECTED NONE DETECTED   MDMA (Ecstasy)Ur Screen NONE DETECTED NONE DETECTED   Cocaine Metabolite,Ur [place] NONE DETECTED NONE DETECTED   Opiate, Ur Screen NONE DETECTED NONE DETECTED   Phencyclidine (PCP) Ur S NONE DETECTED NONE DETECTED   Cannabinoid 50 Ng, Ur [place] NONE DETECTED NONE DETECTED   Barbiturates, Ur Screen NONE DETECTED NONE DETECTED   Benzodiazepine, Ur Scrn NONE DETECTED NONE DETECTED   Methadone Scn, Ur NONE DETECTED NONE DETECTED    Comment: (NOTE) Tricyclics + metabolites, urine    Cutoff 1000 ng/mL Amphetamines + metabolites, urine  Cutoff 1000 ng/mL MDMA (Ecstasy), urine              Cutoff 500 ng/mL Cocaine Metabolite, urine          Cutoff 300 ng/mL Opiate + metabolites, urine        Cutoff 300 ng/mL Phencyclidine (PCP), urine         Cutoff 25 ng/mL  Cannabinoid, urine                 Cutoff 50 ng/mL Barbiturates + metabolites, urine  Cutoff 200 ng/mL Benzodiazepine, urine              Cutoff 200 ng/mL Methadone, urine                   Cutoff 300 ng/mL  The urine drug screen provides only a preliminary, unconfirmed analytical test result and should not be used for non-medical purposes. Clinical consideration and professional judgment should be applied to any positive drug screen result due to possible interfering substances. A more specific  alternate chemical method must be used in order to obtain a confirmed analytical result. Gas chromatography / mass spectrometry (GC/MS) is the preferred confirm atory method. Performed at [facility], [address] [place], [place] [address]   Pregnancy, urine     Status: None   Collection Time: 05/29/22 11:40 PM  Result Value Ref Range   Preg Test, Ur NEGATIVE NEGATIVE    Comment: Performed at [facility], [address] [place], [place] [address]    No current facility-administered medications for this encounter.   Current Outpatient Medications  Medication Sig Dispense Refill   albuterol (PROVENTIL HFA) 108 (90 Base) MCG/ACT inhaler Inhale 1-2 puffs into the lungs once every 6 (six) hours as needed for wheezing or shortness of breath. 6.7 g 5   Blood Pressure KIT USE AS DIRECTED TWICE DAILY. 1 kit 0   buprenorphine-naloxone (SUBOXONE) 8-2 mg SUBL SL tablet Place 1 tablet under the tongue daily.     fluticasone-salmeterol (WIXELA INHUB) 100-50 MCG/ACT AEPB Inhale 1 puff into the lungs 2 (two) times daily. (Patient not taking: Reported on 03/08/2022) 60 each 3   guaiFENesin (ROBITUSSIN) 100 MG/5ML liquid Take 5 mLs (100 mg total) by mouth every 4 (four) hours as needed for cough or to loosen phlegm. Take during the day 240 mL 0   hydrALAZINE (APRESOLINE) 50 MG tablet Take 1 tablet (50 mg total) by mouth 3 (three) times daily for 30 doses. 90 tablet 1   lisinopril (ZESTRIL) 40 MG tablet Take 1 tablet (40 mg total) by mouth daily. 60 tablet 0   naproxen (NAPROSYN) 250 MG tablet Take 250 mg by mouth 2 (two) times daily as needed for mild pain or moderate pain.     QUEtiapine (SEROQUEL) 100 MG tablet Take 1 tablet (100 mg total) by mouth once nightly at bedtime. 7 tablet 0    Musculoskeletal: Strength & Muscle Tone: within normal limits Gait & Station: normal Patient leans: N/A  Psychiatric Specialty Exam:  Presentation  General Appearance:  Appropriate for  Environment  Eye Contact: Good  Speech: Clear and Coherent  Speech Volume: Normal  Handedness: Right   Mood and Affect  Mood: Anxious  Affect: Flat   Thought Process  Thought Processes: Coherent  Descriptions of Associations:Intact  Orientation:Full (Time, Place and Person)  Thought Content:Logical  History of Schizophrenia/Schizoaffective disorder:No  Duration of Psychotic Symptoms:No data recorded Hallucinations:Hallucinations: None  Ideas of Reference:None  Suicidal Thoughts:Suicidal Thoughts: No  Homicidal Thoughts:Homicidal Thoughts: No   Sensorium  Memory: Immediate Good; Recent Good; Remote Good  Judgment: Good  Insight: Fair   Community education officer  Concentration: Fair  Attention Span: Fair  Recall: [employer] of Knowledge: Fair  Language: Fair   Psychomotor Activity  Psychomotor Activity: Psychomotor Activity: Normal   Assets  Assets: Communication Skills; Desire for Improvement;  Financial Resources/Insurance; Resilience; Physical Health   Sleep  Sleep: Sleep: Poor Number of Hours of Sleep: 2   Physical Exam: Physical Exam Vitals and nursing note reviewed.  Constitutional:      Appearance: Normal appearance. She is normal weight.  HENT:     Head: Normocephalic and atraumatic.     Right Ear: External ear normal.     Left Ear: External ear normal.     Nose: Nose normal.     Mouth/Throat:     Mouth: Mucous membranes are moist.  Cardiovascular:     Rate and Rhythm: Bradycardia present.  Pulmonary:     Effort: Pulmonary effort is normal.  Musculoskeletal:        General: Tenderness present.     Cervical back: Normal range of motion and neck supple.  Skin:    Findings: Erythema and lesion present.  Neurological:     Mental Status: She is alert and oriented to person, place, and time. Mental status is at baseline.  Psychiatric:        Attention and Perception: Attention and perception normal.        Mood  and Affect: Mood is anxious. Affect is blunt and flat.        Speech: Speech normal.        Behavior: Behavior normal. Behavior is cooperative.        Thought Content: Thought content normal.        Cognition and Memory: Cognition and memory normal.        Judgment: Judgment normal.    Review of Systems  Psychiatric/Behavioral:  Positive for depression and substance abuse. The patient is nervous/anxious and has insomnia.    Blood pressure (!) 173/109, pulse (!) 54, temperature 97.7 F (36.5 C), temperature source Oral, resp. rate 20, height _0  (1.6 m), weight 48 kg, SpO2 100 %. Body mass index is 18.75 kg/m.  Treatment Plan Summary: Plan   Patient does not meet the criteria for psychiatric inpatient admission  Disposition: No evidence of imminent risk to self or others at present.   Patient does not meet criteria for psychiatric inpatient admission. Supportive therapy provided about ongoing stressors. Discussed crisis plan, support from social network, calling 911, coming to the Emergency Department, and calling Suicide Hotline.  Caroline Sauger, NP 05/30/2022 3:43 AM

## 2022-05-31 ENCOUNTER — Other Ambulatory Visit: Payer: Self-pay

## 2022-06-01 ENCOUNTER — Other Ambulatory Visit: Payer: Self-pay

## 2022-06-01 DIAGNOSIS — Z419 Encounter for procedure for purposes other than remedying health state, unspecified: Secondary | ICD-10-CM | POA: Diagnosis not present

## 2022-06-05 ENCOUNTER — Telehealth: Payer: Self-pay | Admitting: Gerontology

## 2022-06-05 NOTE — Telephone Encounter (Signed)
Called pt to follow up on end of therapy letter sent on 05/10/2022. Left msg for pt to call back if she has any questions.

## 2022-06-12 ENCOUNTER — Other Ambulatory Visit: Payer: Self-pay

## 2022-06-18 ENCOUNTER — Encounter: Payer: Medicaid Other | Admitting: Family

## 2022-06-22 ENCOUNTER — Ambulatory Visit: Payer: Medicaid Other | Admitting: Family

## 2022-07-02 DIAGNOSIS — Z419 Encounter for procedure for purposes other than remedying health state, unspecified: Secondary | ICD-10-CM | POA: Diagnosis not present

## 2022-07-02 NOTE — Progress Notes (Signed)
  This encounter was created in error - please disregard. No show 

## 2022-07-04 ENCOUNTER — Telehealth: Payer: Self-pay | Admitting: Gerontology

## 2022-07-04 NOTE — Telephone Encounter (Signed)
End of therapy letter sent to pt to inform pt of end of services here at the [facility]. If pt needs another appt to talk about end of therapy or to help find another therapist. PT has until Jul 31, 2022 as stated in the letter.  

## 2022-07-09 ENCOUNTER — Other Ambulatory Visit: Payer: Self-pay

## 2022-07-11 ENCOUNTER — Other Ambulatory Visit: Payer: Self-pay

## 2022-07-16 ENCOUNTER — Ambulatory Visit: Payer: Medicaid Other | Admitting: Adult Health

## 2022-07-18 ENCOUNTER — Telehealth: Payer: Self-pay | Admitting: Gerontology

## 2022-07-18 DIAGNOSIS — Z79899 Other long term (current) drug therapy: Secondary | ICD-10-CM | POA: Diagnosis not present

## 2022-07-18 NOTE — Telephone Encounter (Signed)
Called pt to see if pt needed any help with finding a new therapist/PCP. Pt stated she did. I gave pt a few places to call and make appts for therapy. PT verbalized understanding.

## 2022-07-30 ENCOUNTER — Ambulatory Visit (INDEPENDENT_AMBULATORY_CARE_PROVIDER_SITE_OTHER): Payer: Medicaid Other | Admitting: Adult Health

## 2022-07-30 ENCOUNTER — Encounter: Payer: Self-pay | Admitting: Adult Health

## 2022-07-30 VITALS — BP 132/90 | HR 89 | Temp 98.2°F | Resp 18 | Ht 63.0 in | Wt 116.0 lb

## 2022-07-30 DIAGNOSIS — G894 Chronic pain syndrome: Secondary | ICD-10-CM | POA: Diagnosis not present

## 2022-07-30 DIAGNOSIS — Z1211 Encounter for screening for malignant neoplasm of colon: Secondary | ICD-10-CM | POA: Diagnosis not present

## 2022-07-30 DIAGNOSIS — Z1231 Encounter for screening mammogram for malignant neoplasm of breast: Secondary | ICD-10-CM

## 2022-07-30 DIAGNOSIS — Z7689 Persons encountering health services in other specified circumstances: Secondary | ICD-10-CM | POA: Diagnosis not present

## 2022-07-30 DIAGNOSIS — F319 Bipolar disorder, unspecified: Secondary | ICD-10-CM | POA: Diagnosis not present

## 2022-07-30 DIAGNOSIS — F419 Anxiety disorder, unspecified: Secondary | ICD-10-CM | POA: Diagnosis not present

## 2022-07-30 DIAGNOSIS — J418 Mixed simple and mucopurulent chronic bronchitis: Secondary | ICD-10-CM | POA: Diagnosis not present

## 2022-07-30 DIAGNOSIS — I10 Essential (primary) hypertension: Secondary | ICD-10-CM

## 2022-07-30 MED ORDER — QUETIAPINE FUMARATE 100 MG PO TABS: 100.0000 mg | ORAL_TABLET | Freq: Every day | ORAL | 3 refills | Status: DC

## 2022-07-30 MED ORDER — CLONAZEPAM 0.5 MG PO TABS: 0.2500 mg | ORAL_TABLET | Freq: Two times a day (BID) | ORAL | 0 refills | Status: DC | PRN

## 2022-07-30 MED ORDER — HYDRALAZINE HCL 50 MG PO TABS: 50.0000 mg | ORAL_TABLET | Freq: Three times a day (TID) | ORAL | 1 refills | Status: DC

## 2022-07-30 MED ORDER — ALBUTEROL SULFATE HFA 108 (90 BASE) MCG/ACT IN AERS: 1.0000 | INHALATION_SPRAY | Freq: Four times a day (QID) | RESPIRATORY_TRACT | 5 refills | Status: DC | PRN

## 2022-07-30 NOTE — Progress Notes (Unsigned)
[facility] clinic  Provider:   Code Status:  Full Code  Goals of Care:     07/30/2022    2:24 PM  Advanced Directives  Does Patient Have a Medical Advance Directive? No  Would patient like information on creating a medical advance directive? No - Patient declined     Chief Complaint  Patient presents with   Establish Care    Patient is new and here to establish care. Patient has medication bottles at initial appointment. [facility] verified   Quality Metric Gaps    Patient is overdue on Pap smear. Mammography, and a colonoscopy.   Immunizations    Patient is due for updated covid ,Flu, and Tdap Vaccine Discuss need for shingrix vaccine    HPI: Patient is a 62 y.o. female seen today to establish care with [facility]. She has a PMH of anxiety, depression bipolar, COPD and substance use, hepatitis C and hypertension.  Primary hypertension -  BP 168/96 at home, sometimes higher  Chronic bipolar disorder (HCC) - [facility] but since she has insurance now, she cannot go there anymore   Past Medical History:  Diagnosis Date   Anxiety    Arthritis    Asthma    Bipolar disorder (HCC)    Collagen vascular disease (HCC)    COPD (chronic obstructive pulmonary disease) (HCC)    Hepatitis C    Hypertension    Substance abuse (HCC)     Past Surgical History:  Procedure Laterality Date   ABDOMINAL HYSTERECTOMY     TONSILLECTOMY     TONSILLECTOMY      No Known Allergies  Outpatient Encounter Medications as of 07/30/2022  Medication Sig   albuterol (PROVENTIL HFA) 108 (90 Base) MCG/ACT inhaler Inhale 1-2 puffs into the lungs once every 6 (six) hours as needed for wheezing or shortness of breath.   Blood Pressure KIT USE AS DIRECTED TWICE DAILY.   guaiFENesin (ROBITUSSIN) 100 MG/5ML liquid Take 5 mLs (100 mg total) by mouth every 4 (four) hours as needed for cough or to loosen phlegm. Take during the day   hydrALAZINE (APRESOLINE) 50 MG tablet Take 1 tablet (50 mg total)  by mouth 3 (three) times daily for 30 doses.   lisinopril (ZESTRIL) 40 MG tablet Take 1 tablet (40 mg total) by mouth daily.   naproxen (NAPROSYN) 250 MG tablet Take 250 mg by mouth 2 (two) times daily as needed for mild pain or moderate pain.   QUEtiapine (SEROQUEL) 100 MG tablet Take 1 tablet (100 mg total) by mouth once nightly at bedtime.   [DISCONTINUED] buprenorphine-naloxone (SUBOXONE) 8-2 mg SUBL SL tablet Place 1 tablet under the tongue daily.   [DISCONTINUED] fluticasone-salmeterol (WIXELA INHUB) 100-50 MCG/ACT AEPB Inhale 1 puff into the lungs 2 (two) times daily. (Patient not taking: Reported on 03/08/2022)   No facility-administered encounter medications on file as of 07/30/2022.    Review of Systems:  Review of Systems  Constitutional:  Negative for appetite change, chills, fatigue and fever.  HENT:  Negative for congestion, hearing loss, rhinorrhea and sore throat.   Eyes: Negative.   Respiratory:  Negative for cough, shortness of breath and wheezing.   Cardiovascular:  Negative for chest pain, palpitations and leg swelling.  Gastrointestinal:  Negative for abdominal pain, constipation, diarrhea, nausea and vomiting.  Genitourinary:  Negative for dysuria.  Musculoskeletal:  Negative for arthralgias, back pain and myalgias.  Skin:  Negative for color change, rash and wound.  Neurological:  Negative for dizziness, weakness and headaches.  Psychiatric/Behavioral:  Negative for behavioral problems. The patient is not nervous/anxious.     Health Maintenance  Topic Date Due   COVID-19 Vaccine (1) Never done   DTaP/Tdap/Td (1 - Tdap) Never done   PAP SMEAR-Modifier  Never done   COLONOSCOPY (Pts 45-60yrs Insurance coverage will need to be confirmed)  Never done   MAMMOGRAM  Never done   Zoster Vaccines- Shingrix (1 of 2) Never done   INFLUENZA VACCINE  Never done   Lung Cancer Screening  02/23/2023   Hepatitis C Screening  Completed   HIV Screening  Completed   HPV VACCINES   Aged Out    Physical Exam: There were no vitals filed for this visit. There is no height or weight on file to calculate BMI. Physical Exam Constitutional:      Appearance: Normal appearance.  HENT:     Head: Normocephalic and atraumatic.     Nose: Nose normal.     Mouth/Throat:     Mouth: Mucous membranes are moist.  Eyes:     Conjunctiva/sclera: Conjunctivae normal.  Cardiovascular:     Rate and Rhythm: Normal rate and regular rhythm.  Pulmonary:     Effort: Pulmonary effort is normal.     Breath sounds: Normal breath sounds.  Abdominal:     General: Bowel sounds are normal.     Palpations: Abdomen is soft.  Musculoskeletal:        General: Normal range of motion.     Cervical back: Normal range of motion.  Skin:    General: Skin is warm and dry.  Neurological:     General: No focal deficit present.     Mental Status: She is alert and oriented to person, place, and time.  Psychiatric:        Mood and Affect: Mood normal.        Behavior: Behavior normal.        Thought Content: Thought content normal.        Judgment: Judgment normal.     Labs reviewed: Basic Metabolic Panel: Recent Labs    01/28/22 0551 01/28/22 1326 01/29/22 0615 02/01/22 1623 03/08/22 1159 05/29/22 1934  NA 142  --    < > 138 136 146*  K 2.6* 4.6   < > 3.7 4.3 3.7  CL 109  --    < > 100 99 112*  CO2 20*  --    < > 23 25 24   GLUCOSE 169*  --    < > 101* 79 115*  BUN 10  --    < > 37* 20 15  CREATININE 0.95  --    < > 1.42* 0.85 0.94  CALCIUM 8.6*  --    < > 11.0* 9.8 10.1  MG 1.7  --   --   --   --   --   PHOS  --  2.5  --   --   --   --    < > = values in this interval not displayed.   Liver Function Tests: Recent Labs    02/01/22 1623 03/08/22 1159 05/29/22 1934  AST 396* 59* 71*  ALT 244* 37* 45*  ALKPHOS 119 111 94  BILITOT 0.8 0.3 0.8  PROT 7.3 6.9 9.2*  ALBUMIN 4.0 3.6* 4.3   No results for input(s): "LIPASE", "AMYLASE" in the last 8760 hours. No results for  input(s): "AMMONIA" in the last 8760 hours. CBC: Recent Labs  01/30/22 0353 03/08/22 1159 05/29/22 1934  WBC 4.9 6.0 7.1  NEUTROABS  --  3.2  --   HGB 14.5 13.0 15.6*  HCT 40.6 37.2 45.4  MCV 102.5* 100* 94.8  PLT 95* 182 321   Lipid Panel: Recent Labs    10/03/21 1043  CHOL 151  HDL 77  LDLCALC 57  TRIG 90  CHOLHDL 2.0   No results found for: "HGBA1C"  Procedures since last visit: No results found.  Assessment/Plan  1. Encounter to establish care ***  2. Primary hypertension *** - CBC With Differential/Platelet; Future - Complete Metabolic Panel with eGFR; Future - Lipid panel; Future - Hemoglobin A1C; Future - hydrALAZINE (APRESOLINE) 50 MG tablet; Take 1 tablet (50 mg total) by mouth 3 (three) times daily.  Dispense: 90 tablet; Refill: 1  3. Chronic bipolar disorder (HCC) *** - QUEtiapine (SEROQUEL) 100 MG tablet; Take 1 tablet (100 mg total) by mouth once nightly at bedtime.  Dispense: 30 tablet; Refill: 3  4. Chronic pain syndrome -  goes to [facility] urgent care in [place] -  takes Suboxane  5. Anxiety   6. Mixed simple and mucopurulent chronic bronchitis (HCC) *** - albuterol (PROVENTIL HFA) 108 (90 Base) MCG/ACT inhaler; Inhale 1-2 puffs into the lungs once every 6 (six) hours as needed for wheezing or shortness of breath.  Dispense: 6.7 g; Refill: 5  7. Colon cancer screening *** - Ambulatory referral to Gastroenterology  8. Encounter for screening mammogram for malignant neoplasm of breast *** - MM Digital Screening    Labs/tests ordered:    Next appt:  Visit date not found

## 2022-07-30 NOTE — Patient Instructions (Signed)

## 2022-07-31 ENCOUNTER — Telehealth: Payer: Self-pay

## 2022-07-31 LAB — HEMOGLOBIN A1C
Hgb A1c MFr Bld: 4.9 % of total Hgb (ref <5.7)
Mean Plasma Glucose: 94 mg/dL
eAG (mmol/L): 5.2 mmol/L

## 2022-07-31 LAB — CBC WITH DIFFERENTIAL/PLATELET
Absolute Monocytes: 604 cells/uL (ref 200–950)
Basophils Absolute: 78 cells/uL (ref 0–200)
Basophils Relative: 1.1 %
Eosinophils Absolute: 440 cells/uL (ref 15–500)
Eosinophils Relative: 6.2 %
HCT: 39.1 % (ref 35.0–45.0)
Hemoglobin: 13.6 g/dL (ref 11.7–15.5)
Lymphs Abs: 2769 cells/uL (ref 850–3900)
MCH: 34.5 pg — ABNORMAL HIGH (ref 27.0–33.0)
MCHC: 34.8 g/dL (ref 32.0–36.0)
MCV: 99.2 fL (ref 80.0–100.0)
MPV: 12.4 fL (ref 7.5–12.5)
Monocytes Relative: 8.5 %
Neutro Abs: 3209 cells/uL (ref 1500–7800)
Neutrophils Relative %: 45.2 %
Platelets: 204 10*3/uL (ref 140–400)
RBC: 3.94 10*6/uL (ref 3.80–5.10)
RDW: 12.9 % (ref 11.0–15.0)
Total Lymphocyte: 39 %
WBC: 7.1 10*3/uL (ref 3.8–10.8)

## 2022-07-31 LAB — COMPLETE METABOLIC PANEL WITH GFR
AG Ratio: 1 (calc) (ref 1.0–2.5)
ALT: 20 U/L (ref 6–29)
AST: 31 U/L (ref 10–35)
Albumin: 3.8 g/dL (ref 3.6–5.1)
Alkaline phosphatase (APISO): 112 U/L (ref 37–153)
BUN/Creatinine Ratio: 16 (calc) (ref 6–22)
BUN: 20 mg/dL (ref 7–25)
CO2: 25 mmol/L (ref 20–32)
Calcium: 9.3 mg/dL (ref 8.6–10.4)
Chloride: 106 mmol/L (ref 98–110)
Creat: 1.24 mg/dL — ABNORMAL HIGH (ref 0.50–1.03)
Globulin: 3.8 g/dL (calc) — ABNORMAL HIGH (ref 1.9–3.7)
Glucose, Bld: 88 mg/dL (ref 65–139)
Potassium: 4.7 mmol/L (ref 3.5–5.3)
Sodium: 138 mmol/L (ref 135–146)
Total Bilirubin: 0.3 mg/dL (ref 0.2–1.2)
Total Protein: 7.6 g/dL (ref 6.1–8.1)
eGFR: 50 mL/min/{1.73_m2} — ABNORMAL LOW (ref >=60)

## 2022-07-31 LAB — LIPID PANEL
Cholesterol: 161 mg/dL (ref <200)
HDL: 70 mg/dL (ref >=50)
LDL Cholesterol (Calc): 67 mg/dL (calc)
Non-HDL Cholesterol (Calc): 91 mg/dL (calc) (ref <130)
Total CHOL/HDL Ratio: 2.3 (calc) (ref <5.0)
Triglycerides: 158 mg/dL — ABNORMAL HIGH (ref <150)

## 2022-07-31 NOTE — Telephone Encounter (Signed)
Patient has been contacted to schedule her colonoscopy, however she was not able to schedule at this time.   She said she will call us back to schedule.  Thanks, [place], [place]

## 2022-08-01 ENCOUNTER — Telehealth: Payer: Self-pay

## 2022-08-01 NOTE — Telephone Encounter (Signed)
Patient called stating that her prescription for clonazepam was suppose to be for 60 tablets not 30. I explained to the patient the directions on the medication and she still said it's suppose to be for 60 tablets. She was given 30 tablets at the pharmacy.  Message routed to Durenda Age, NP

## 2022-08-01 NOTE — Telephone Encounter (Signed)
Per verbal conversation with Medina-Vargas, Monina C, NP the dose, instructions, and dispense number are correct. Monina indicated that the treatment agreement on file was incorrect and asked that I update and mail to patient.   Spoke with patient, patient states this is not going to be enough for her, as she was previously on three times daily, #90 and lately she has been on a whole tablet twice daily #60. Patient mentioned that she was off the medication for a while and almost went crazy. Patient states she will call her old doctor and possibly get re-establish with them since Jaymes Graff is not willing to change her instructions, dose, or dispense number.

## 2022-08-01 NOTE — Telephone Encounter (Signed)
Medina-Vargas, Monina C, NP  You32 minutes ago (2:43 PM)    Noted.

## 2022-08-01 NOTE — Progress Notes (Signed)
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 -  Triglycerides 158, elevated (normal <150) -  A1c and CBC normal

## 2022-08-02 DIAGNOSIS — Z419 Encounter for procedure for purposes other than remedying health state, unspecified: Secondary | ICD-10-CM | POA: Diagnosis not present

## 2022-08-07 NOTE — Telephone Encounter (Signed)
Patient would like to call back at a later time to schedule her colonoscopy.  Informed her that I will go ahead and send a reminder letter.  Thanks [place], [place]

## 2022-08-09 ENCOUNTER — Telehealth: Payer: Self-pay

## 2022-08-09 NOTE — Telephone Encounter (Signed)
Called pt to check in after sending end of therapy letter and making 1st follow up call and see if pt needed any extra resources or help finding a new PCP. Pt has established care and has 1st appt in April.

## 2022-08-17 DIAGNOSIS — Z79899 Other long term (current) drug therapy: Secondary | ICD-10-CM | POA: Diagnosis not present

## 2022-08-18 DIAGNOSIS — Z79899 Other long term (current) drug therapy: Secondary | ICD-10-CM | POA: Diagnosis not present

## 2022-08-18 DIAGNOSIS — F419 Anxiety disorder, unspecified: Secondary | ICD-10-CM | POA: Diagnosis not present

## 2022-08-31 DIAGNOSIS — Z419 Encounter for procedure for purposes other than remedying health state, unspecified: Secondary | ICD-10-CM | POA: Diagnosis not present

## 2022-09-14 DIAGNOSIS — F419 Anxiety disorder, unspecified: Secondary | ICD-10-CM | POA: Diagnosis not present

## 2022-09-14 DIAGNOSIS — Z79899 Other long term (current) drug therapy: Secondary | ICD-10-CM | POA: Diagnosis not present

## 2022-10-01 DIAGNOSIS — Z419 Encounter for procedure for purposes other than remedying health state, unspecified: Secondary | ICD-10-CM | POA: Diagnosis not present

## 2022-10-16 DIAGNOSIS — F419 Anxiety disorder, unspecified: Secondary | ICD-10-CM | POA: Diagnosis not present

## 2022-10-29 ENCOUNTER — Ambulatory Visit: Payer: Medicaid Other | Admitting: Adult Health

## 2022-10-31 DIAGNOSIS — Z419 Encounter for procedure for purposes other than remedying health state, unspecified: Secondary | ICD-10-CM | POA: Diagnosis not present

## 2022-11-13 ENCOUNTER — Other Ambulatory Visit: Payer: Self-pay | Admitting: Adult Health

## 2022-11-13 DIAGNOSIS — Z79899 Other long term (current) drug therapy: Secondary | ICD-10-CM | POA: Diagnosis not present

## 2022-11-13 DIAGNOSIS — F319 Bipolar disorder, unspecified: Secondary | ICD-10-CM

## 2022-11-13 DIAGNOSIS — F419 Anxiety disorder, unspecified: Secondary | ICD-10-CM | POA: Diagnosis not present

## 2022-11-30 ENCOUNTER — Other Ambulatory Visit: Payer: Self-pay | Admitting: Adult Health

## 2022-11-30 DIAGNOSIS — F319 Bipolar disorder, unspecified: Secondary | ICD-10-CM

## 2022-12-01 DIAGNOSIS — Z419 Encounter for procedure for purposes other than remedying health state, unspecified: Secondary | ICD-10-CM | POA: Diagnosis not present

## 2022-12-12 DIAGNOSIS — F419 Anxiety disorder, unspecified: Secondary | ICD-10-CM | POA: Diagnosis not present

## 2022-12-12 DIAGNOSIS — Z79899 Other long term (current) drug therapy: Secondary | ICD-10-CM | POA: Diagnosis not present

## 2022-12-25 ENCOUNTER — Other Ambulatory Visit: Payer: Self-pay | Admitting: Adult Health

## 2022-12-25 DIAGNOSIS — F319 Bipolar disorder, unspecified: Secondary | ICD-10-CM

## 2022-12-26 ENCOUNTER — Other Ambulatory Visit: Payer: Self-pay | Admitting: Adult Health

## 2022-12-26 DIAGNOSIS — I10 Essential (primary) hypertension: Secondary | ICD-10-CM

## 2022-12-26 DIAGNOSIS — M2012 Hallux valgus (acquired), left foot: Secondary | ICD-10-CM | POA: Diagnosis not present

## 2022-12-26 DIAGNOSIS — M79672 Pain in left foot: Secondary | ICD-10-CM | POA: Diagnosis not present

## 2022-12-26 DIAGNOSIS — M12572 Traumatic arthropathy, left ankle and foot: Secondary | ICD-10-CM | POA: Diagnosis not present

## 2022-12-27 ENCOUNTER — Other Ambulatory Visit: Payer: Self-pay | Admitting: Adult Health

## 2022-12-27 DIAGNOSIS — I10 Essential (primary) hypertension: Secondary | ICD-10-CM

## 2022-12-29 DIAGNOSIS — F419 Anxiety disorder, unspecified: Secondary | ICD-10-CM | POA: Diagnosis not present

## 2022-12-31 DIAGNOSIS — Z419 Encounter for procedure for purposes other than remedying health state, unspecified: Secondary | ICD-10-CM | POA: Diagnosis not present

## 2023-01-09 DIAGNOSIS — Z79899 Other long term (current) drug therapy: Secondary | ICD-10-CM | POA: Diagnosis not present

## 2023-01-09 DIAGNOSIS — F419 Anxiety disorder, unspecified: Secondary | ICD-10-CM | POA: Diagnosis not present

## 2023-01-16 DIAGNOSIS — G894 Chronic pain syndrome: Secondary | ICD-10-CM | POA: Diagnosis not present

## 2023-01-16 DIAGNOSIS — Z8619 Personal history of other infectious and parasitic diseases: Secondary | ICD-10-CM | POA: Diagnosis not present

## 2023-01-16 DIAGNOSIS — Z87891 Personal history of nicotine dependence: Secondary | ICD-10-CM | POA: Diagnosis not present

## 2023-01-16 DIAGNOSIS — M12572 Traumatic arthropathy, left ankle and foot: Secondary | ICD-10-CM | POA: Diagnosis not present

## 2023-01-16 DIAGNOSIS — F1011 Alcohol abuse, in remission: Secondary | ICD-10-CM | POA: Diagnosis not present

## 2023-01-16 DIAGNOSIS — M85872 Other specified disorders of bone density and structure, left ankle and foot: Secondary | ICD-10-CM | POA: Diagnosis not present

## 2023-01-16 DIAGNOSIS — M2012 Hallux valgus (acquired), left foot: Secondary | ICD-10-CM | POA: Diagnosis not present

## 2023-01-16 DIAGNOSIS — M79672 Pain in left foot: Secondary | ICD-10-CM | POA: Diagnosis not present

## 2023-01-16 DIAGNOSIS — F1911 Other psychoactive substance abuse, in remission: Secondary | ICD-10-CM | POA: Diagnosis not present

## 2023-01-31 DIAGNOSIS — Z419 Encounter for procedure for purposes other than remedying health state, unspecified: Secondary | ICD-10-CM | POA: Diagnosis not present

## 2023-02-02 ENCOUNTER — Other Ambulatory Visit: Payer: Self-pay

## 2023-02-02 ENCOUNTER — Emergency Department
Admission: EM | Admit: 2023-02-02 | Discharge: 2023-02-02 | Disposition: A | Discharge status: Home / Self Care | Payer: Medicaid Other | Attending: Emergency Medicine | Admitting: Emergency Medicine

## 2023-02-02 DIAGNOSIS — F10120 Alcohol abuse with intoxication, uncomplicated: Secondary | ICD-10-CM | POA: Diagnosis not present

## 2023-02-02 DIAGNOSIS — F1010 Alcohol abuse, uncomplicated: Secondary | ICD-10-CM

## 2023-02-02 DIAGNOSIS — I10 Essential (primary) hypertension: Secondary | ICD-10-CM | POA: Diagnosis not present

## 2023-02-02 DIAGNOSIS — Z743 Need for continuous supervision: Secondary | ICD-10-CM | POA: Diagnosis not present

## 2023-02-02 DIAGNOSIS — F10920 Alcohol use, unspecified with intoxication, uncomplicated: Secondary | ICD-10-CM

## 2023-02-02 NOTE — ED Triage Notes (Signed)
BIB [facility] from home. Pt reports ETOH consumption x 4 days. Denies medical complaints or concerns. States "I need to quit drinking but I don't want to." Pt alert and oriented. HTN noted with hx of same.

## 2023-02-02 NOTE — ED Provider Notes (Signed)
   [facility]    Event Date/Time   First MD Initiated Contact with Patient 02/02/23 0221     (approximate)   History   Alcohol Intoxication   HPI  Nicole [name] is a 62 y.o. female who presents to the ED for evaluation of Alcohol Intoxication   Patient presents to the ED from home via EMS for evaluation of being drunk.  She reports drinking wine coolers all night, as recently as 1 or 2 hours ago by the time I see her.  She reports that she has not had any other recreational drugs beyond ethanol.  She is requesting help to get sober.  No suicidal intent, hallucinations or recent illnesses.  No pain.   Physical Exam   Triage Vital Signs: ED Triage Vitals  Encounter Vitals Group     BP 02/02/23 0215 (!) 197/128     Systolic BP Percentile --      Diastolic BP Percentile --      Pulse Rate 02/02/23 0215 (!) 111     Resp 02/02/23 0215 20     Temp 02/02/23 0215 99.1 F (37.3 C)     Temp Source 02/02/23 0215 Oral     SpO2 02/02/23 0215 96 %     Weight 02/02/23 0214 110 lb (49.9 kg)     Height 02/02/23 0214 5\' 3"  (1.6 m)     Head Circumference --      Peak Flow --      Pain Score 02/02/23 0219 0     Pain Loc --      Pain Education --      Exclude from Growth Chart --     Most recent vital signs: Vitals:   02/02/23 0215  BP: (!) 197/128  Pulse: (!) 111  Resp: 20  Temp: 99.1 F (37.3 C)  SpO2: 96%    General: Awake, no distress.  Mildly intoxicated CV:  Good peripheral perfusion.  Resp:  Normal effort.  Abd:  No distention.  MSK:  No deformity noted.  Neuro:  No focal deficits appreciated. Other:     ED Results / Procedures / Treatments   Labs (all labs ordered are listed, but only abnormal results are displayed) Labs Reviewed - No data to display  EKG   RADIOLOGY   Official radiology report(s): No results found.  PROCEDURES and INTERVENTIONS:  Procedures  Medications - No data to  display   IMPRESSION / MDM / ASSESSMENT AND PLAN / ED COURSE  I reviewed the triage vital signs and the nursing notes.  Differential diagnosis includes, but is not limited to, polysubstance abuse, alcohol abuse, alcohol withdrawals, suicidal  Patient presents to the ED drunk requesting help getting sober.  No actual complaints otherwise.  No signs of DTs or withdrawals yet.  No evidence of psychiatric emergency to require IVC or emergent psychiatric evaluation.  Provided both outpatient and inpatient rehabilitation resources, [facility] information and discussed return precautions.  Suitable for outpatient management.       FINAL CLINICAL IMPRESSION(S) / ED DIAGNOSES   Final diagnoses:  Alcoholic intoxication without complication (HCC)  Alcohol abuse     Rx / DC Orders   ED Discharge Orders     None        Note:  This document was prepared using Dragon voice recognition software and may include unintentional dictation errors.   Delton [place], MD 02/02/23 [phone]

## 2023-02-02 NOTE — ED Triage Notes (Signed)
BIB [facility] from home.   Pt reports to EMS that she has been drinking for 4 days. Pt denies medical complaints or concerns to EMS. Pt was HTN with EMS with bp 199/123.   99% RA HR 102

## 2023-02-06 DIAGNOSIS — F17210 Nicotine dependence, cigarettes, uncomplicated: Secondary | ICD-10-CM | POA: Diagnosis not present

## 2023-02-06 DIAGNOSIS — F419 Anxiety disorder, unspecified: Secondary | ICD-10-CM | POA: Diagnosis not present

## 2023-02-06 DIAGNOSIS — Z79899 Other long term (current) drug therapy: Secondary | ICD-10-CM | POA: Diagnosis not present

## 2023-02-15 ENCOUNTER — Other Ambulatory Visit: Payer: Self-pay

## 2023-03-03 DIAGNOSIS — Z419 Encounter for procedure for purposes other than remedying health state, unspecified: Secondary | ICD-10-CM | POA: Diagnosis not present

## 2023-03-06 DIAGNOSIS — F419 Anxiety disorder, unspecified: Secondary | ICD-10-CM | POA: Diagnosis not present

## 2023-03-06 DIAGNOSIS — Z79899 Other long term (current) drug therapy: Secondary | ICD-10-CM | POA: Diagnosis not present

## 2023-04-03 DIAGNOSIS — F419 Anxiety disorder, unspecified: Secondary | ICD-10-CM | POA: Diagnosis not present

## 2023-04-03 DIAGNOSIS — Z79899 Other long term (current) drug therapy: Secondary | ICD-10-CM | POA: Diagnosis not present

## 2023-05-02 DIAGNOSIS — Z79899 Other long term (current) drug therapy: Secondary | ICD-10-CM | POA: Diagnosis not present

## 2023-05-02 DIAGNOSIS — F419 Anxiety disorder, unspecified: Secondary | ICD-10-CM | POA: Diagnosis not present

## 2023-05-03 DIAGNOSIS — Z419 Encounter for procedure for purposes other than remedying health state, unspecified: Secondary | ICD-10-CM | POA: Diagnosis not present

## 2023-05-27 DIAGNOSIS — H90A22 Sensorineural hearing loss, unilateral, left ear, with restricted hearing on the contralateral side: Secondary | ICD-10-CM | POA: Diagnosis not present

## 2023-05-27 DIAGNOSIS — R42 Dizziness and giddiness: Secondary | ICD-10-CM | POA: Diagnosis not present

## 2023-05-27 DIAGNOSIS — E860 Dehydration: Secondary | ICD-10-CM | POA: Diagnosis not present

## 2023-05-29 DIAGNOSIS — Z79899 Other long term (current) drug therapy: Secondary | ICD-10-CM | POA: Diagnosis not present

## 2023-05-29 DIAGNOSIS — F419 Anxiety disorder, unspecified: Secondary | ICD-10-CM | POA: Diagnosis not present

## 2023-06-02 DIAGNOSIS — Z419 Encounter for procedure for purposes other than remedying health state, unspecified: Secondary | ICD-10-CM | POA: Diagnosis not present

## 2023-06-20 ENCOUNTER — Ambulatory Visit: Payer: Medicaid Other | Admitting: Nurse Practitioner

## 2023-06-20 NOTE — Progress Notes (Deleted)
There were no vitals taken for this visit.   Subjective:    Patient ID: Nicole [name], female    DOB: [birth year], 62 y.o.   MRN: [id number]  HPI: Nicole [name] is a 62 y.o. female  No chief complaint on file.  Patient presents to clinic to establish care with new PCP.  Introduced to Publishing rights manager role and practice setting.  All questions answered.  Discussed provider/patient relationship and expectations.  Patient reports a history of ***. Patient denies a history of: Hypertension, Elevated Cholesterol, Diabetes, Thyroid problems, Depression, Anxiety, Neurological problems, and Abdominal problems.   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   Cocaine abuse (HCC) 10/22/2016   Hypertension 12/10/2018   Pleural effusion on right 08/31/2020   Collagen vascular disease (HCC)    Hyponatremia    Acute lower UTI    Nicotine dependence    Protein-calorie malnutrition, severe 09/02/2020   Empyema (HCC) 09/05/2020   Encounter to establish care 10/03/2021   History of hypertension 10/03/2021   History of bipolar disorder 10/03/2021   History of COPD 10/03/2021   Smoking 10/03/2021   Ear stuffiness, left 10/03/2021   Ankle edema, bilateral 10/03/2021   Hip pain, chronic, right 10/10/2021   Hearing loss 11/08/2021   Cough 11/08/2021   Tachycardia 11/08/2021   Alcohol withdrawal (HCC) 01/28/2022   Hypokalemia 01/28/2022   Diarrhea 01/28/2022   Abnormal finding on imaging 02/01/2022   Abnormal renal function test 03/08/2022   Resolved Ambulatory Problems    Diagnosis Date Noted   No Resolved Ambulatory Problems   Past Medical History:  Diagnosis Date   Anxiety    Arthritis    Bipolar disorder (HCC)    COPD (chronic obstructive pulmonary  disease) (HCC)    Hepatitis C    Substance abuse (HCC)    Past Surgical History:  Procedure Laterality Date   ABDOMINAL HYSTERECTOMY     TONSILLECTOMY     TONSILLECTOMY     Family History  Problem Relation Age of Onset   Depression Mother    Hypertension Mother    Heart attack Mother    Insomnia Mother    Thyroid disease Mother    Bipolar disorder Mother    Other Father        Parkinson's disease   Parkinson's disease Father    Arthritis Maternal Grandmother    Bipolar disorder Maternal Grandmother    Depression Maternal Grandmother    Other Maternal Grandfather        unknown medical history   Heart attack Paternal Grandmother    Ovarian cancer Paternal Grandmother    Hyperlipidemia Paternal Grandfather    Hypertension Paternal Grandfather    Congestive Heart Failure Paternal Grandfather    Rheum arthritis Other    Osteoporosis Other    Clotting disorder Other    Stroke Other      Review of Systems  Per HPI unless specifically indicated above     Objective:    There were no vitals taken for this visit.  Wt Readings from Last 3 Encounters:  02/02/23 110 lb (49.9 kg)  07/30/22 116 lb (52.6 kg)  05/29/22 105 lb 13.1 oz (48 kg)    Physical Exam  Results for orders placed or performed in visit on  07/30/22  Hemoglobin A1C   Collection Time: 07/30/22  3:56 PM  Result Value Ref Range   Hgb A1c MFr Bld 4.9 <5.7 % of total Hgb   Mean Plasma Glucose 94 mg/dL   eAG (mmol/L) 5.2 mmol/L  Lipid panel   Collection Time: 07/30/22  3:56 PM  Result Value Ref Range   Cholesterol 161 <200 mg/dL   HDL 70 > OR = 50 mg/dL   Triglycerides 161 (H) <150 mg/dL   LDL Cholesterol (Calc) 67 mg/dL (calc)   Total CHOL/HDL Ratio 2.3 <5.0 (calc)   Non-HDL Cholesterol (Calc) 91 <096 mg/dL (calc)  Complete Metabolic Panel with eGFR   Collection Time: 07/30/22  3:56 PM  Result Value Ref Range   Glucose, Bld 88 65 - 139 mg/dL   BUN 20 7 - 25 mg/dL   Creat 0.45 (H) 4.09 - 1.03  mg/dL   eGFR 50 (L) > OR = 60 mL/min/1.67m2   BUN/Creatinine Ratio 16 6 - 22 (calc)   Sodium 138 135 - 146 mmol/L   Potassium 4.7 3.5 - 5.3 mmol/L   Chloride 106 98 - 110 mmol/L   CO2 25 20 - 32 mmol/L   Calcium 9.3 8.6 - 10.4 mg/dL   Total Protein 7.6 6.1 - 8.1 g/dL   Albumin 3.8 3.6 - 5.1 g/dL   Globulin 3.8 (H) 1.9 - 3.7 g/dL (calc)   AG Ratio 1.0 1.0 - 2.5 (calc)   Total Bilirubin 0.3 0.2 - 1.2 mg/dL   Alkaline phosphatase (APISO) 112 37 - 153 U/L   AST 31 10 - 35 U/L   ALT 20 6 - 29 U/L  CBC With Differential/Platelet   Collection Time: 07/30/22  3:56 PM  Result Value Ref Range   WBC 7.1 3.8 - 10.8 Thousand/uL   RBC 3.94 3.80 - 5.10 Million/uL   Hemoglobin 13.6 11.7 - 15.5 g/dL   HCT 81.1 91.4 - 78.2 %   MCV 99.2 80.0 - 100.0 fL   MCH 34.5 (H) 27.0 - 33.0 pg   MCHC 34.8 32.0 - 36.0 g/dL   RDW 95.6 21.3 - 08.6 %   Platelets 204 140 - 400 Thousand/uL   MPV 12.4 7.5 - 12.5 fL   Neutro Abs 3,209 1,500 - 7,800 cells/uL   Lymphs Abs 2,769 850 - 3,900 cells/uL   Absolute Monocytes 604 200 - 950 cells/uL   Eosinophils Absolute 440 15 - 500 cells/uL   Basophils Absolute 78 0 - 200 cells/uL   Neutrophils Relative % 45.2 %   Total Lymphocyte 39.0 %   Monocytes Relative 8.5 %   Eosinophils Relative 6.2 %   Basophils Relative 1.1 %      Assessment & Plan:   Problem List Items Addressed This Visit   None    Follow up plan: No follow-ups on file.

## 2023-06-27 DIAGNOSIS — F419 Anxiety disorder, unspecified: Secondary | ICD-10-CM | POA: Diagnosis not present

## 2023-06-27 DIAGNOSIS — Z79899 Other long term (current) drug therapy: Secondary | ICD-10-CM | POA: Diagnosis not present

## 2023-07-01 DIAGNOSIS — R059 Cough, unspecified: Secondary | ICD-10-CM | POA: Diagnosis not present

## 2023-07-03 DIAGNOSIS — Z419 Encounter for procedure for purposes other than remedying health state, unspecified: Secondary | ICD-10-CM | POA: Diagnosis not present

## 2023-07-25 DIAGNOSIS — R059 Cough, unspecified: Secondary | ICD-10-CM | POA: Diagnosis not present

## 2023-07-25 DIAGNOSIS — Z79899 Other long term (current) drug therapy: Secondary | ICD-10-CM | POA: Diagnosis not present

## 2023-07-25 DIAGNOSIS — F419 Anxiety disorder, unspecified: Secondary | ICD-10-CM | POA: Diagnosis not present

## 2023-08-03 DIAGNOSIS — Z419 Encounter for procedure for purposes other than remedying health state, unspecified: Secondary | ICD-10-CM | POA: Diagnosis not present

## 2023-08-23 DIAGNOSIS — Z79899 Other long term (current) drug therapy: Secondary | ICD-10-CM | POA: Diagnosis not present

## 2023-08-23 DIAGNOSIS — F419 Anxiety disorder, unspecified: Secondary | ICD-10-CM | POA: Diagnosis not present

## 2023-08-23 DIAGNOSIS — R059 Cough, unspecified: Secondary | ICD-10-CM | POA: Diagnosis not present

## 2023-08-23 DIAGNOSIS — J449 Chronic obstructive pulmonary disease, unspecified: Secondary | ICD-10-CM | POA: Diagnosis not present

## 2023-08-31 DIAGNOSIS — Z419 Encounter for procedure for purposes other than remedying health state, unspecified: Secondary | ICD-10-CM | POA: Diagnosis not present

## 2023-09-25 DIAGNOSIS — Z79899 Other long term (current) drug therapy: Secondary | ICD-10-CM | POA: Diagnosis not present

## 2023-09-25 DIAGNOSIS — I10 Essential (primary) hypertension: Secondary | ICD-10-CM | POA: Diagnosis not present

## 2023-09-25 DIAGNOSIS — F419 Anxiety disorder, unspecified: Secondary | ICD-10-CM | POA: Diagnosis not present

## 2023-09-25 DIAGNOSIS — J449 Chronic obstructive pulmonary disease, unspecified: Secondary | ICD-10-CM | POA: Diagnosis not present

## 2023-09-27 DIAGNOSIS — I10 Essential (primary) hypertension: Secondary | ICD-10-CM | POA: Diagnosis not present

## 2023-09-27 DIAGNOSIS — J449 Chronic obstructive pulmonary disease, unspecified: Secondary | ICD-10-CM | POA: Diagnosis not present

## 2023-10-01 ENCOUNTER — Ambulatory Visit: Payer: Self-pay

## 2023-10-01 NOTE — Telephone Encounter (Signed)
 Chief Complaint: cough Symptoms: cough with wheezing and sob Frequency: years Pertinent Negatives: Patient denies fever Disposition: [] ED /[x] Urgent Care (no appt availability in office) / [] Appointment(In office/virtual)/ []  [facility]/ [] Home Care/ [] Refused Recommended Disposition /[] [facility]/ []  Follow-up with PCP Additional Notes: pt states that she has been dealing with a cough and copd with wheezing for over a year. States she was seen in 3/26 and given a zpak and inhalers and not better. States mild-mod SOB. Patient states that she is a smoker.  Pt does not have [facility] PCP, instructed her to go to UC in [place] and keep her new pt appt for 11/01/23.  Copied from CRM [id number]. Topic: Clinical - Red Word Triage >> Oct 01, 2023  4:14 PM Marlow Baars wrote: Red Word that prompted transfer to Nurse Triage: The patient called in to schedule a New pt Appt  but she has had a chronic cough since last Summer. She went to [facility] in [place] where she was diagnosed with COPD. She has been struggling with Shortness of Breath some and she is scared she may have cancer. She has been scheduled per her request with Dr Evelene Croon. I will transfer her to E2C2 NT Reason for Disposition  [1] Known COPD or other severe lung disease (i.e., bronchiectasis, cystic fibrosis, lung surgery) AND [2] worsening symptoms (i.e., increased sputum purulence or amount, increased breathing difficulty  Answer Assessment - Initial Assessment Questions 1. ONSET: "When did the cough begin?"      Last summer 2. SEVERITY: "How bad is the cough today?"      severe 3. SPUTUM: "Describe the color of your sputum" (none, dry cough; clear, white, yellow, green)     Thick white sputum during day then tan at night  4. HEMOPTYSIS: "Are you coughing up any blood?" If so ask: "How much?" (flecks, streaks, tablespoons, etc.)     no 5. DIFFICULTY BREATHING: "Are you having difficulty breathing?" If Yes, ask: "How  bad is it?" (e.g., mild, moderate, severe)    - MILD: No SOB at rest, mild SOB with walking, speaks normally in sentences, can lie down, no retractions, pulse < 100.    - MODERATE: SOB at rest, SOB with minimal exertion and prefers to sit, cannot lie down flat, speaks in phrases, mild retractions, audible wheezing, pulse 100-120.    - SEVERE: Very SOB at rest, speaks in single words, struggling to breathe, sitting hunched forward, retractions, pulse > 120      Mild-mod 6. FEVER: "Do you have a fever?" If Yes, ask: "What is your temperature, how was it measured, and when did it start?"     no 7. CARDIAC HISTORY: "Do you have any history of heart disease?" (e.g., heart attack, congestive heart failure)      no 8. LUNG HISTORY: "Do you have any history of lung disease?"  (e.g., pulmonary embolus, asthma, emphysema)     copd 9. PE RISK FACTORS: "Do you have a history of blood clots?" (or: recent major surgery, recent prolonged travel, bedridden)     no 10. OTHER SYMPTOMS: "Do you have any other symptoms?" (e.g., runny nose, wheezing, chest pain)       Runny nose, wheezing  Protocols used: Cough - Acute Productive-A-AH

## 2023-10-12 DIAGNOSIS — Z419 Encounter for procedure for purposes other than remedying health state, unspecified: Secondary | ICD-10-CM | POA: Diagnosis not present

## 2023-10-23 DIAGNOSIS — J449 Chronic obstructive pulmonary disease, unspecified: Secondary | ICD-10-CM | POA: Diagnosis not present

## 2023-10-23 DIAGNOSIS — Z79899 Other long term (current) drug therapy: Secondary | ICD-10-CM | POA: Diagnosis not present

## 2023-10-23 DIAGNOSIS — R059 Cough, unspecified: Secondary | ICD-10-CM | POA: Diagnosis not present

## 2023-10-23 DIAGNOSIS — F419 Anxiety disorder, unspecified: Secondary | ICD-10-CM | POA: Diagnosis not present

## 2023-11-01 ENCOUNTER — Encounter: Admitting: Pediatrics

## 2023-11-07 ENCOUNTER — Emergency Department
Admission: EM | Admit: 2023-11-07 | Discharge: 2023-11-07 | Discharge status: Against Medical Advice | Attending: Emergency Medicine | Admitting: Emergency Medicine

## 2023-11-07 DIAGNOSIS — Z5321 Procedure and treatment not carried out due to patient leaving prior to being seen by health care provider: Secondary | ICD-10-CM | POA: Insufficient documentation

## 2023-11-07 DIAGNOSIS — N939 Abnormal uterine and vaginal bleeding, unspecified: Secondary | ICD-10-CM | POA: Diagnosis not present

## 2023-11-07 DIAGNOSIS — I10 Essential (primary) hypertension: Secondary | ICD-10-CM | POA: Diagnosis not present

## 2023-11-07 DIAGNOSIS — R000 Tachycardia, unspecified: Secondary | ICD-10-CM | POA: Diagnosis not present

## 2023-11-07 DIAGNOSIS — Z743 Need for continuous supervision: Secondary | ICD-10-CM | POA: Diagnosis not present

## 2023-11-07 NOTE — ED Notes (Addendum)
 First nurse note-pt brought in via ems from home with 1 episode of vag bleeding last night.  No bleeding today.  +etoh use today.   Iv in place  Bp-163/107 O2 sats 100% P 110 97.6 ax Fsbs 152

## 2023-11-07 NOTE — ED Notes (Signed)
 Pt refusing treatment , iv removed.  Friend here for pt.

## 2023-11-07 NOTE — ED Triage Notes (Signed)
 Patient states she wants to call her husband and does not want to be triaged at this time. Patient taken to phone at check in desk.

## 2023-11-11 DIAGNOSIS — Z419 Encounter for procedure for purposes other than remedying health state, unspecified: Secondary | ICD-10-CM | POA: Diagnosis not present

## 2023-11-19 DIAGNOSIS — K219 Gastro-esophageal reflux disease without esophagitis: Secondary | ICD-10-CM | POA: Diagnosis not present

## 2023-11-19 DIAGNOSIS — R070 Pain in throat: Secondary | ICD-10-CM | POA: Diagnosis not present

## 2023-11-19 DIAGNOSIS — E860 Dehydration: Secondary | ICD-10-CM | POA: Diagnosis not present

## 2023-11-20 DIAGNOSIS — R059 Cough, unspecified: Secondary | ICD-10-CM | POA: Diagnosis not present

## 2023-11-20 DIAGNOSIS — Z79899 Other long term (current) drug therapy: Secondary | ICD-10-CM | POA: Diagnosis not present

## 2023-11-20 DIAGNOSIS — J449 Chronic obstructive pulmonary disease, unspecified: Secondary | ICD-10-CM | POA: Diagnosis not present

## 2023-11-20 DIAGNOSIS — F419 Anxiety disorder, unspecified: Secondary | ICD-10-CM | POA: Diagnosis not present

## 2023-12-12 DIAGNOSIS — Z419 Encounter for procedure for purposes other than remedying health state, unspecified: Secondary | ICD-10-CM | POA: Diagnosis not present

## 2023-12-20 DIAGNOSIS — J449 Chronic obstructive pulmonary disease, unspecified: Secondary | ICD-10-CM | POA: Diagnosis not present

## 2023-12-20 DIAGNOSIS — F419 Anxiety disorder, unspecified: Secondary | ICD-10-CM | POA: Diagnosis not present

## 2023-12-20 DIAGNOSIS — Z79899 Other long term (current) drug therapy: Secondary | ICD-10-CM | POA: Diagnosis not present

## 2023-12-25 DIAGNOSIS — J449 Chronic obstructive pulmonary disease, unspecified: Secondary | ICD-10-CM | POA: Diagnosis not present

## 2024-01-07 ENCOUNTER — Ambulatory Visit: Admitting: Pediatrics

## 2024-01-07 ENCOUNTER — Encounter: Payer: Self-pay | Admitting: Pediatrics

## 2024-01-07 VITALS — BP 155/112 | HR 125 | Temp 98.1°F | Ht 62.0 in | Wt 103.4 lb

## 2024-01-07 DIAGNOSIS — J449 Chronic obstructive pulmonary disease, unspecified: Secondary | ICD-10-CM | POA: Diagnosis not present

## 2024-01-07 DIAGNOSIS — F1090 Alcohol use, unspecified, uncomplicated: Secondary | ICD-10-CM

## 2024-01-07 DIAGNOSIS — I10 Essential (primary) hypertension: Secondary | ICD-10-CM

## 2024-01-07 DIAGNOSIS — Z8659 Personal history of other mental and behavioral disorders: Secondary | ICD-10-CM

## 2024-01-07 DIAGNOSIS — Z1330 Encounter for screening examination for mental health and behavioral disorders, unspecified: Secondary | ICD-10-CM

## 2024-01-07 DIAGNOSIS — Z7689 Persons encountering health services in other specified circumstances: Secondary | ICD-10-CM

## 2024-01-07 DIAGNOSIS — L0291 Cutaneous abscess, unspecified: Secondary | ICD-10-CM | POA: Diagnosis not present

## 2024-01-07 LAB — CBC
Hematocrit: 43.4 % (ref 34.0–46.6)
Hemoglobin: 15.3 g/dL (ref 11.1–15.9)
MCH: 35.7 pg — ABNORMAL HIGH (ref 26.6–33.0)
MCHC: 35.3 g/dL (ref 31.5–35.7)
MCV: 101 fL — ABNORMAL HIGH (ref 79–97)
Platelets: 390 x10E3/uL (ref 150–450)
RBC: 4.29 x10E6/uL (ref 3.77–5.28)
RDW: 14.7 % (ref 11.7–15.4)
WBC: 12.3 x10E3/uL — ABNORMAL HIGH (ref 3.4–10.8)

## 2024-01-07 MED ORDER — DOXYCYCLINE HYCLATE 100 MG PO TABS: 100.0000 mg | ORAL_TABLET | Freq: Two times a day (BID) | ORAL | 0 refills | Status: DC

## 2024-01-07 MED ORDER — OLMESARTAN MEDOXOMIL 20 MG PO TABS: 20.0000 mg | ORAL_TABLET | Freq: Every day | ORAL | 2 refills | Status: DC

## 2024-01-07 MED ORDER — NALTREXONE HCL 50 MG PO TABS: 50.0000 mg | ORAL_TABLET | Freq: Every day | ORAL | 2 refills | Status: DC

## 2024-01-07 NOTE — Progress Notes (Signed)
 Establish Care Note  BP (!) 155/112   Pulse (!) 125   Temp 98.1 F (36.7 C) (Oral)   Ht 5' 2 (1.575 m)   Wt 103 lb 6.4 oz (46.9 kg)   SpO2 95%   BMI 18.91 kg/m    Subjective:    Patient ID: Nicole [name], female    DOB: 07-26-1960, 63 y.o.   MRN: [id number]  HPI: Nicole [name] is a 63 y.o. female  Chief Complaint  Patient presents with   Establish Care    Places on body that are not healing, scalp on right bottom     Establishing care, the following was discussed today:  Discussed the use of AI scribe software for clinical note transcription with the patient, who gave verbal consent to proceed.  History of Present Illness   Nicole [name] is a 63 year old female with COPD who presents with a painful leg wound.  The pain in her leg began about a week ago while walking to the store. It started as an itch in the back of her leg, which she scratched, leading to fluid discharge and the development of a large, boil-like lesion. The pain has worsened, becoming severe enough to prevent her from wearing jeans and causing difficulty sitting. She uses a pillow for comfort and reports that the pain is intense enough to bring her to tears. She has not experienced a similar issue before.  She has not yet received her prescription for lisinopril  for her elevated blood pressure. She has been on lisinopril  in the past but has not started it recently. She is currently on Symbicort for her COPD, having switched from albuterol . She continues to smoke, which exacerbates her COPD symptoms. She is unsure of Symbicort's effectiveness as she has only been on it for a short period. She has a history of using prednisone , but it is unclear if she is currently taking it.  She experiences insomnia, sleeping only 20-30 minutes at a time due to pain and discomfort. She weighs approximately 103 pounds and describes herself as 'tiny'.  She is on Suboxone  for substance use disorder and  reports that it is going well. She drinks alcohol but not excessively, mentioning that a single wine cooler can last her six hours. She recalls being on a medication in the past to help with alcohol cravings, possibly naltrexone .  She has a history of bipolar disorder and is currently taking Seroquel , which she feels does not help much. She receives her psychiatric medications from [facility] and is considering seeing a local psychiatrist for better management.        No current facility-administered medications on file prior to visit.   Current Outpatient Medications on File Prior to Visit  Medication Sig Dispense Refill   Buprenorphine  HCl-Naloxone  HCl 8-2 MG FILM Place under the tongue 2 (two) times daily.     hydrOXYzine  (ATARAX ) 25 MG tablet SMARTSIG:1 Tablet(s) By Mouth Every 12 Hours PRN     mirtazapine  (REMERON ) 30 MG tablet Take 30 mg by mouth at bedtime.     naproxen  (NAPROSYN ) 250 MG tablet Take 250 mg by mouth 2 (two) times daily as needed for mild pain or moderate pain.     omeprazole (PRILOSEC) 40 MG capsule Take by mouth 2 (two) times daily.     albuterol  (PROVENTIL  HFA) 108 (90 Base) MCG/ACT inhaler Inhale 1-2 puffs into the lungs once every 6 (six) hours as needed for wheezing or shortness of  breath. 6.7 g 5   Blood Pressure KIT USE AS DIRECTED TWICE DAILY. 1 kit 0   guaiFENesin  (ROBITUSSIN) 100 MG/5ML liquid Take 5 mLs (100 mg total) by mouth every 4 (four) hours as needed for cough or to loosen phlegm. Take during the day 240 mL 0   hydrALAZINE  (APRESOLINE ) 50 MG tablet Take 1 tablet (50 mg total) by mouth 3 (three) times daily. APPOINTMENT OVERDUE (Patient not taking: Reported on 01/15/2024) 90 tablet 1    #HM Will review HM records and updated as needed.  Relevant past medical, surgical, family and social history reviewed and updated as indicated. Interim medical history since our last visit reviewed. Allergies and medications reviewed and updated.  ROS per  HPI unless specifically indicated above     Objective:    BP (!) 155/112   Pulse (!) 125   Temp 98.1 F (36.7 C) (Oral)   Ht 5' 2 (1.575 m)   Wt 103 lb 6.4 oz (46.9 kg)   SpO2 95%   BMI 18.91 kg/m   Wt Readings from Last 3 Encounters:  01/14/24 103 lb 6.3 oz (46.9 kg)  01/07/24 103 lb 6.4 oz (46.9 kg)  02/02/23 110 lb (49.9 kg)     Physical Exam Constitutional:      Appearance: Normal appearance.  Pulmonary:     Effort: Pulmonary effort is normal.  Musculoskeletal:        General: Normal range of motion.  Skin:    Findings: Rash present. Papular rash: abscess in below area, black rim.         Comments: Normal skin color  Neurological:     General: No focal deficit present.     Mental Status: She is alert. Mental status is at baseline.  Psychiatric:        Mood and Affect: Mood normal.        Behavior: Behavior normal.        Thought Content: Thought content normal.         04/17/2022    5:16 PM 04/10/2022    2:17 PM 03/06/2022    2:18 PM 12/13/2021    2:03 PM 12/05/2021    2:10 PM  Depression screen PHQ 2/9  Decreased Interest 2 3 2 3 3   Down, Depressed, Hopeless 2 2 2 3 3   PHQ - 2 Score 4 5 4 6 6   Altered sleeping 3 3 2 3 3   Tired, decreased energy 3 2 3 3 3   Change in appetite 3 3 0 3 3  Feeling bad or failure about yourself  1 1 0 2 0  Trouble concentrating 3 3 0 3 3  Moving slowly or fidgety/restless 1 0 0 0 0  Suicidal thoughts 0 0 0 0 0  PHQ-9 Score 18 17 9 20 18   Difficult doing work/chores  Very difficult Somewhat difficult Extremely dIfficult         04/17/2022    5:16 PM 04/10/2022    2:16 PM 03/06/2022    2:16 PM 12/13/2021    2:04 PM  GAD 7 : Generalized Anxiety Score  Nervous, Anxious, on Edge 3 3 3 3   Control/stop worrying 3 3 3 3   Worry too much - different things 3 3 3 3   Trouble relaxing 3 3 3 3   Restless 2 0 0 2  Easily annoyed or irritable 0 0 0 3  Afraid - awful might happen 3 3 3 3   Total GAD 7 Score 17 15 15  20  Anxiety  Difficulty Very difficult Very difficult Very difficult Very difficult       Assessment & Plan:  Assessment & Plan   Primary hypertension Assessment & Plan: Blood pressure very elevated. She has not obtained lisinopril . Prefers olmesartan  for fewer side effects. - Prescribed olmesartan  20 mg, adjust as needed. - Monitor blood pressure at follow-up next week.  Orders: -     Olmesartan  Medoxomil; Take 1 tablet (20 mg total) by mouth daily.  Dispense: 30 tablet; Refill: 2 -     Comprehensive metabolic panel with GFR  Chronic obstructive pulmonary disease, unspecified COPD type (HCC) Assessment & Plan: Currently using Symbicort, uncertain of effectiveness. Has not quit smoking. Breztri recommended. - Provided Breztri samples, instructed two puffs twice daily. - Continue albuterol  as needed. - Discussed smoking cessation options.   History of bipolar disorder Assessment & Plan: Seroquel  not effective. Interested in seeing a psychiatrist for better management. - Referred to local psychiatrist for evaluation and management.  Orders: -     Ambulatory referral to Psychiatry  Alcohol use disorder Assessment & Plan: Expresses interest in restarting naltrexone  to reduce alcohol cravings. - Prescribed naltrexone  50 mg daily.  Orders: -     Naltrexone  HCl; Take 1 tablet (50 mg total) by mouth daily.  Dispense: 30 tablet; Refill: 2  Abscess Lesion on posterior leg suggests abscess with possible deeper infection. Significant pain affects sitting and sleeping. - Prescribed doxycycline  for 10 days. - Referred to wound care specialists for evaluation and possible imaging. - Scheduled follow-up next week to assess progress. - Ordered blood work to evaluate for systemic infection. -     Doxycycline  Hyclate; Take 1 tablet (100 mg total) by mouth 2 (two) times daily for 10 days.  Dispense: 20 tablet; Refill: 0 -     C-reactive protein -     Ambulatory referral to Wound Clinic -      CBC  Encounter to establish care Reviewed available patient record including history, medications, problem list. HM updated as able. Will review and/or request outside records (if applicable) and will fill remaining HM gaps as needed at follow up visit.  Encounter for behavioral health screening As part of their intake evaluation, the patient was screened for depression, anxiety.  PHQ9 SCORE 18, GAD7 SCORE 17. Screening results positive for tested conditions. See plan under problem/diagnosis above.   Follow up plan: Return in about 1 week (around 01/14/2024).  Hadassah SHAUNNA Nett, MD

## 2024-01-07 NOTE — Patient Instructions (Addendum)
 For cravings: naltrexone  50mg  daily  For blood pressure: start olmesartan  20mg  daily  For your wound: doxycycline  100mg  twice daily for 10 days. Follow up next week to check in.   For COPD: try the new inhaler breztri twice daily, continue your rescue inhaler as needed

## 2024-01-08 LAB — COMPREHENSIVE METABOLIC PANEL WITH GFR
ALT: 14 IU/L (ref 0–32)
AST: 58 IU/L — ABNORMAL HIGH (ref 0–40)
Albumin: 3.5 g/dL — ABNORMAL LOW (ref 3.8–4.9)
Alkaline Phosphatase: 209 IU/L — ABNORMAL HIGH (ref 44–121)
BUN/Creatinine Ratio: 5 — ABNORMAL LOW (ref 12–28)
BUN: 5 mg/dL — ABNORMAL LOW (ref 8–27)
Bilirubin Total: 0.5 mg/dL (ref 0.0–1.2)
CO2: 18 mmol/L — ABNORMAL LOW (ref 20–29)
Calcium: 9.2 mg/dL (ref 8.7–10.3)
Chloride: 95 mmol/L — ABNORMAL LOW (ref 96–106)
Creatinine, Ser: 1.05 mg/dL — ABNORMAL HIGH (ref 0.57–1.00)
Globulin, Total: 5.4 g/dL — ABNORMAL HIGH (ref 1.5–4.5)
Glucose: 84 mg/dL (ref 70–99)
Potassium: 4.1 mmol/L (ref 3.5–5.2)
Sodium: 132 mmol/L — ABNORMAL LOW (ref 134–144)
Total Protein: 8.9 g/dL — ABNORMAL HIGH (ref 6.0–8.5)
eGFR: 61 mL/min/1.73 (ref >59)

## 2024-01-08 LAB — C-REACTIVE PROTEIN: CRP: <1 mg/L (ref 0–10)

## 2024-01-09 ENCOUNTER — Ambulatory Visit: Payer: Self-pay | Admitting: Pediatrics

## 2024-01-11 DIAGNOSIS — Z419 Encounter for procedure for purposes other than remedying health state, unspecified: Secondary | ICD-10-CM | POA: Diagnosis not present

## 2024-01-14 ENCOUNTER — Other Ambulatory Visit: Payer: Self-pay

## 2024-01-14 DIAGNOSIS — E43 Unspecified severe protein-calorie malnutrition: Secondary | ICD-10-CM | POA: Diagnosis present

## 2024-01-14 DIAGNOSIS — Y929 Unspecified place or not applicable: Secondary | ICD-10-CM

## 2024-01-14 DIAGNOSIS — K2981 Duodenitis with bleeding: Principal | ICD-10-CM | POA: Diagnosis present

## 2024-01-14 DIAGNOSIS — F419 Anxiety disorder, unspecified: Secondary | ICD-10-CM | POA: Diagnosis present

## 2024-01-14 DIAGNOSIS — E860 Dehydration: Secondary | ICD-10-CM | POA: Diagnosis present

## 2024-01-14 DIAGNOSIS — Z823 Family history of stroke: Secondary | ICD-10-CM

## 2024-01-14 DIAGNOSIS — R531 Weakness: Secondary | ICD-10-CM | POA: Diagnosis not present

## 2024-01-14 DIAGNOSIS — F17290 Nicotine dependence, other tobacco product, uncomplicated: Secondary | ICD-10-CM | POA: Diagnosis present

## 2024-01-14 DIAGNOSIS — R195 Other fecal abnormalities: Secondary | ICD-10-CM | POA: Diagnosis present

## 2024-01-14 DIAGNOSIS — N179 Acute kidney failure, unspecified: Secondary | ICD-10-CM | POA: Diagnosis not present

## 2024-01-14 DIAGNOSIS — L98419 Non-pressure chronic ulcer of buttock with unspecified severity: Secondary | ICD-10-CM | POA: Diagnosis present

## 2024-01-14 DIAGNOSIS — R296 Repeated falls: Secondary | ICD-10-CM | POA: Diagnosis present

## 2024-01-14 DIAGNOSIS — B1920 Unspecified viral hepatitis C without hepatic coma: Secondary | ICD-10-CM | POA: Diagnosis present

## 2024-01-14 DIAGNOSIS — F319 Bipolar disorder, unspecified: Secondary | ICD-10-CM | POA: Diagnosis present

## 2024-01-14 DIAGNOSIS — W19XXXA Unspecified fall, initial encounter: Secondary | ICD-10-CM | POA: Diagnosis present

## 2024-01-14 DIAGNOSIS — J439 Emphysema, unspecified: Secondary | ICD-10-CM | POA: Diagnosis present

## 2024-01-14 DIAGNOSIS — L309 Dermatitis, unspecified: Secondary | ICD-10-CM | POA: Diagnosis present

## 2024-01-14 DIAGNOSIS — Z681 Body mass index (BMI) 19 or less, adult: Secondary | ICD-10-CM

## 2024-01-14 DIAGNOSIS — F1010 Alcohol abuse, uncomplicated: Secondary | ICD-10-CM | POA: Diagnosis present

## 2024-01-14 DIAGNOSIS — R509 Fever, unspecified: Secondary | ICD-10-CM | POA: Diagnosis not present

## 2024-01-14 DIAGNOSIS — Z83438 Family history of other disorder of lipoprotein metabolism and other lipidemia: Secondary | ICD-10-CM

## 2024-01-14 DIAGNOSIS — Z8262 Family history of osteoporosis: Secondary | ICD-10-CM

## 2024-01-14 DIAGNOSIS — Z8261 Family history of arthritis: Secondary | ICD-10-CM

## 2024-01-14 DIAGNOSIS — J4489 Other specified chronic obstructive pulmonary disease: Secondary | ICD-10-CM | POA: Diagnosis present

## 2024-01-14 DIAGNOSIS — E871 Hypo-osmolality and hyponatremia: Secondary | ICD-10-CM | POA: Diagnosis present

## 2024-01-14 DIAGNOSIS — Z79899 Other long term (current) drug therapy: Secondary | ICD-10-CM

## 2024-01-14 DIAGNOSIS — R748 Abnormal levels of other serum enzymes: Secondary | ICD-10-CM | POA: Diagnosis not present

## 2024-01-14 DIAGNOSIS — Z1152 Encounter for screening for COVID-19: Secondary | ICD-10-CM

## 2024-01-14 DIAGNOSIS — M6250 Muscle wasting and atrophy, not elsewhere classified, unspecified site: Secondary | ICD-10-CM | POA: Diagnosis present

## 2024-01-14 DIAGNOSIS — G8929 Other chronic pain: Secondary | ICD-10-CM | POA: Diagnosis present

## 2024-01-14 DIAGNOSIS — E66813 Obesity, class 3: Secondary | ICD-10-CM | POA: Diagnosis present

## 2024-01-14 DIAGNOSIS — D62 Acute posthemorrhagic anemia: Secondary | ICD-10-CM | POA: Diagnosis present

## 2024-01-14 DIAGNOSIS — Z8349 Family history of other endocrine, nutritional and metabolic diseases: Secondary | ICD-10-CM

## 2024-01-14 DIAGNOSIS — Z8249 Family history of ischemic heart disease and other diseases of the circulatory system: Secondary | ICD-10-CM

## 2024-01-14 DIAGNOSIS — E876 Hypokalemia: Secondary | ICD-10-CM | POA: Diagnosis present

## 2024-01-14 DIAGNOSIS — L89222 Pressure ulcer of left hip, stage 2: Secondary | ICD-10-CM | POA: Diagnosis present

## 2024-01-14 DIAGNOSIS — S40022A Contusion of left upper arm, initial encounter: Secondary | ICD-10-CM | POA: Diagnosis present

## 2024-01-14 DIAGNOSIS — F17210 Nicotine dependence, cigarettes, uncomplicated: Secondary | ICD-10-CM | POA: Diagnosis present

## 2024-01-14 DIAGNOSIS — K219 Gastro-esophageal reflux disease without esophagitis: Secondary | ICD-10-CM | POA: Diagnosis present

## 2024-01-14 DIAGNOSIS — R112 Nausea with vomiting, unspecified: Secondary | ICD-10-CM | POA: Diagnosis present

## 2024-01-14 DIAGNOSIS — I700 Atherosclerosis of aorta: Secondary | ICD-10-CM | POA: Diagnosis present

## 2024-01-14 DIAGNOSIS — Z832 Family history of diseases of the blood and blood-forming organs and certain disorders involving the immune mechanism: Secondary | ICD-10-CM

## 2024-01-14 DIAGNOSIS — Z820 Family history of epilepsy and other diseases of the nervous system: Secondary | ICD-10-CM

## 2024-01-14 DIAGNOSIS — I10 Essential (primary) hypertension: Secondary | ICD-10-CM | POA: Diagnosis present

## 2024-01-14 DIAGNOSIS — R58 Hemorrhage, not elsewhere classified: Secondary | ICD-10-CM | POA: Diagnosis not present

## 2024-01-14 DIAGNOSIS — Z743 Need for continuous supervision: Secondary | ICD-10-CM | POA: Diagnosis not present

## 2024-01-14 LAB — COMPREHENSIVE METABOLIC PANEL WITH GFR
ALT: 34 U/L (ref 0–44)
AST: 70 U/L — ABNORMAL HIGH (ref 15–41)
Albumin: 2.6 g/dL — ABNORMAL LOW (ref 3.5–5.0)
Alkaline Phosphatase: 82 U/L (ref 38–126)
Anion gap: 13 (ref 5–15)
BUN: 31 mg/dL — ABNORMAL HIGH (ref 8–23)
CO2: 24 mmol/L (ref 22–32)
Calcium: 9.1 mg/dL (ref 8.9–10.3)
Chloride: 87 mmol/L — ABNORMAL LOW (ref 98–111)
Creatinine, Ser: 1.18 mg/dL — ABNORMAL HIGH (ref 0.44–1.00)
GFR, Estimated: 53 mL/min — ABNORMAL LOW (ref >60)
Glucose, Bld: 155 mg/dL — ABNORMAL HIGH (ref 70–99)
Potassium: 3.7 mmol/L (ref 3.5–5.1)
Sodium: 124 mmol/L — ABNORMAL LOW (ref 135–145)
Total Bilirubin: 1.2 mg/dL (ref 0.0–1.2)
Total Protein: 6.3 g/dL — ABNORMAL LOW (ref 6.5–8.1)

## 2024-01-14 LAB — CBC
HCT: 30.1 % — ABNORMAL LOW (ref 36.0–46.0)
Hemoglobin: 11.1 g/dL — ABNORMAL LOW (ref 12.0–15.0)
MCH: 35.6 pg — ABNORMAL HIGH (ref 26.0–34.0)
MCHC: 36.9 g/dL — ABNORMAL HIGH (ref 30.0–36.0)
MCV: 96.5 fL (ref 80.0–100.0)
Platelets: 334 K/uL (ref 150–400)
RBC: 3.12 MIL/uL — ABNORMAL LOW (ref 3.87–5.11)
RDW: 13.1 % (ref 11.5–15.5)
WBC: 14.8 K/uL — ABNORMAL HIGH (ref 4.0–10.5)
nRBC: 0 % (ref 0.0–0.2)

## 2024-01-14 NOTE — ED Notes (Addendum)
 Pt arrives via EMS for c/o groin pain; hx ETOH abuse but has not had any in 3 days

## 2024-01-14 NOTE — ED Triage Notes (Signed)
 Patient wheeled to triage after being brought in via EMS with complaints of a fall at home. Patient states she lost her balance due to a bunion on her foot. Patient states that her bottom is raw, states that she has been unable to walk for a week now and has not been able to clean herself properly. Patient also states she has been bleeding around her bottom and when her pants stick to it, it is peeling the skin away. Denies hitting head when falling, states she only bruised her arms and knees when she caught herself.

## 2024-01-15 ENCOUNTER — Ambulatory Visit: Admitting: Nurse Practitioner

## 2024-01-15 ENCOUNTER — Inpatient Hospital Stay
Admission: EM | Admit: 2024-01-15 | Discharge: 2024-01-22 | DRG: 377 | Disposition: A | Discharge status: Home / Self Care | Attending: Internal Medicine | Admitting: Internal Medicine

## 2024-01-15 DIAGNOSIS — I10 Essential (primary) hypertension: Secondary | ICD-10-CM | POA: Diagnosis not present

## 2024-01-15 DIAGNOSIS — G8929 Other chronic pain: Secondary | ICD-10-CM | POA: Insufficient documentation

## 2024-01-15 DIAGNOSIS — R7401 Elevation of levels of liver transaminase levels: Secondary | ICD-10-CM | POA: Diagnosis not present

## 2024-01-15 DIAGNOSIS — R112 Nausea with vomiting, unspecified: Secondary | ICD-10-CM | POA: Diagnosis not present

## 2024-01-15 DIAGNOSIS — R531 Weakness: Secondary | ICD-10-CM | POA: Diagnosis not present

## 2024-01-15 DIAGNOSIS — Z8249 Family history of ischemic heart disease and other diseases of the circulatory system: Secondary | ICD-10-CM | POA: Diagnosis not present

## 2024-01-15 DIAGNOSIS — D62 Acute posthemorrhagic anemia: Secondary | ICD-10-CM | POA: Diagnosis not present

## 2024-01-15 DIAGNOSIS — R5081 Fever presenting with conditions classified elsewhere: Secondary | ICD-10-CM | POA: Diagnosis not present

## 2024-01-15 DIAGNOSIS — R710 Precipitous drop in hematocrit: Secondary | ICD-10-CM

## 2024-01-15 DIAGNOSIS — E860 Dehydration: Secondary | ICD-10-CM | POA: Diagnosis present

## 2024-01-15 DIAGNOSIS — L249 Irritant contact dermatitis, unspecified cause: Secondary | ICD-10-CM | POA: Diagnosis not present

## 2024-01-15 DIAGNOSIS — G894 Chronic pain syndrome: Secondary | ICD-10-CM

## 2024-01-15 DIAGNOSIS — L89222 Pressure ulcer of left hip, stage 2: Secondary | ICD-10-CM | POA: Diagnosis not present

## 2024-01-15 DIAGNOSIS — Z681 Body mass index (BMI) 19 or less, adult: Secondary | ICD-10-CM | POA: Diagnosis not present

## 2024-01-15 DIAGNOSIS — B1920 Unspecified viral hepatitis C without hepatic coma: Secondary | ICD-10-CM | POA: Diagnosis not present

## 2024-01-15 DIAGNOSIS — K2980 Duodenitis without bleeding: Secondary | ICD-10-CM

## 2024-01-15 DIAGNOSIS — Z1152 Encounter for screening for COVID-19: Secondary | ICD-10-CM | POA: Diagnosis not present

## 2024-01-15 DIAGNOSIS — E871 Hypo-osmolality and hyponatremia: Principal | ICD-10-CM | POA: Diagnosis present

## 2024-01-15 DIAGNOSIS — I700 Atherosclerosis of aorta: Secondary | ICD-10-CM | POA: Diagnosis not present

## 2024-01-15 DIAGNOSIS — N179 Acute kidney failure, unspecified: Secondary | ICD-10-CM | POA: Diagnosis not present

## 2024-01-15 DIAGNOSIS — R296 Repeated falls: Secondary | ICD-10-CM | POA: Diagnosis present

## 2024-01-15 DIAGNOSIS — R509 Fever, unspecified: Secondary | ICD-10-CM

## 2024-01-15 DIAGNOSIS — K219 Gastro-esophageal reflux disease without esophagitis: Secondary | ICD-10-CM | POA: Diagnosis not present

## 2024-01-15 DIAGNOSIS — F1090 Alcohol use, unspecified, uncomplicated: Secondary | ICD-10-CM | POA: Diagnosis present

## 2024-01-15 DIAGNOSIS — R634 Abnormal weight loss: Secondary | ICD-10-CM | POA: Diagnosis not present

## 2024-01-15 DIAGNOSIS — J439 Emphysema, unspecified: Secondary | ICD-10-CM

## 2024-01-15 DIAGNOSIS — R2681 Unsteadiness on feet: Principal | ICD-10-CM

## 2024-01-15 DIAGNOSIS — E876 Hypokalemia: Secondary | ICD-10-CM | POA: Diagnosis present

## 2024-01-15 DIAGNOSIS — W19XXXA Unspecified fall, initial encounter: Secondary | ICD-10-CM | POA: Diagnosis present

## 2024-01-15 DIAGNOSIS — F32A Depression, unspecified: Secondary | ICD-10-CM | POA: Insufficient documentation

## 2024-01-15 DIAGNOSIS — F1011 Alcohol abuse, in remission: Secondary | ICD-10-CM | POA: Diagnosis not present

## 2024-01-15 DIAGNOSIS — F1010 Alcohol abuse, uncomplicated: Secondary | ICD-10-CM | POA: Diagnosis present

## 2024-01-15 DIAGNOSIS — F319 Bipolar disorder, unspecified: Secondary | ICD-10-CM | POA: Diagnosis not present

## 2024-01-15 DIAGNOSIS — F3289 Other specified depressive episodes: Secondary | ICD-10-CM

## 2024-01-15 DIAGNOSIS — K2981 Duodenitis with bleeding: Secondary | ICD-10-CM | POA: Diagnosis not present

## 2024-01-15 DIAGNOSIS — E66813 Obesity, class 3: Secondary | ICD-10-CM | POA: Diagnosis not present

## 2024-01-15 DIAGNOSIS — D649 Anemia, unspecified: Secondary | ICD-10-CM | POA: Diagnosis not present

## 2024-01-15 DIAGNOSIS — J449 Chronic obstructive pulmonary disease, unspecified: Secondary | ICD-10-CM | POA: Insufficient documentation

## 2024-01-15 DIAGNOSIS — E43 Unspecified severe protein-calorie malnutrition: Secondary | ICD-10-CM | POA: Diagnosis not present

## 2024-01-15 DIAGNOSIS — Y929 Unspecified place or not applicable: Secondary | ICD-10-CM | POA: Diagnosis not present

## 2024-01-15 DIAGNOSIS — L98419 Non-pressure chronic ulcer of buttock with unspecified severity: Secondary | ICD-10-CM | POA: Diagnosis present

## 2024-01-15 DIAGNOSIS — J4489 Other specified chronic obstructive pulmonary disease: Secondary | ICD-10-CM | POA: Diagnosis present

## 2024-01-15 DIAGNOSIS — F17210 Nicotine dependence, cigarettes, uncomplicated: Secondary | ICD-10-CM | POA: Diagnosis present

## 2024-01-15 LAB — NA AND K (SODIUM & POTASSIUM), RAND UR
Potassium Urine: 43 mmol/L
Sodium, Ur: <10 mmol/L

## 2024-01-15 LAB — BASIC METABOLIC PANEL WITH GFR
Anion gap: 7 (ref 5–15)
BUN: 31 mg/dL — ABNORMAL HIGH (ref 8–23)
CO2: 24 mmol/L (ref 22–32)
Calcium: 8.3 mg/dL — ABNORMAL LOW (ref 8.9–10.3)
Chloride: 96 mmol/L — ABNORMAL LOW (ref 98–111)
Creatinine, Ser: 1.17 mg/dL — ABNORMAL HIGH (ref 0.44–1.00)
GFR, Estimated: 53 mL/min — ABNORMAL LOW (ref >60)
Glucose, Bld: 151 mg/dL — ABNORMAL HIGH (ref 70–99)
Potassium: 2.9 mmol/L — ABNORMAL LOW (ref 3.5–5.1)
Sodium: 127 mmol/L — ABNORMAL LOW (ref 135–145)

## 2024-01-15 LAB — CBC
HCT: 26.1 % — ABNORMAL LOW (ref 36.0–46.0)
Hemoglobin: 9.5 g/dL — ABNORMAL LOW (ref 12.0–15.0)
MCH: 35.2 pg — ABNORMAL HIGH (ref 26.0–34.0)
MCHC: 36.4 g/dL — ABNORMAL HIGH (ref 30.0–36.0)
MCV: 96.7 fL (ref 80.0–100.0)
Platelets: 245 K/uL (ref 150–400)
RBC: 2.7 MIL/uL — ABNORMAL LOW (ref 3.87–5.11)
RDW: 13.2 % (ref 11.5–15.5)
WBC: 15.6 K/uL — ABNORMAL HIGH (ref 4.0–10.5)
nRBC: 0 % (ref 0.0–0.2)

## 2024-01-15 LAB — HIV ANTIBODY (ROUTINE TESTING W REFLEX): HIV Screen 4th Generation wRfx: NONREACTIVE

## 2024-01-15 LAB — MAGNESIUM: Magnesium: 2.3 mg/dL (ref 1.7–2.4)

## 2024-01-15 LAB — TSH: TSH: 1.854 u[IU]/mL (ref 0.350–4.500)

## 2024-01-15 LAB — OSMOLALITY: Osmolality: 274 mosm/kg — ABNORMAL LOW (ref 275–295)

## 2024-01-15 LAB — MRSA NEXT GEN BY PCR, NASAL: MRSA by PCR Next Gen: NOT DETECTED

## 2024-01-15 LAB — OSMOLALITY, URINE: Osmolality, Ur: 444 mosm/kg (ref 300–900)

## 2024-01-15 LAB — ABO/RH: ABO/RH(D): O POS

## 2024-01-15 MED ORDER — IPRATROPIUM-ALBUTEROL 0.5-2.5 (3) MG/3ML IN SOLN
3.0000 mL | Freq: Four times a day (QID) | RESPIRATORY_TRACT | Status: DC
  Administered 2024-01-15: 3 mL via RESPIRATORY_TRACT
  Filled 2024-01-15: qty 3

## 2024-01-15 MED ORDER — GERHARDT'S BUTT CREAM
TOPICAL_CREAM | Freq: Three times a day (TID) | CUTANEOUS | Status: DC
  Administered 2024-01-15 – 2024-01-19 (×3): 1 via TOPICAL
  Filled 2024-01-15 (×2): qty 60

## 2024-01-15 MED ORDER — CHLORHEXIDINE GLUCONATE CLOTH 2 % EX PADS
6.0000 | MEDICATED_PAD | Freq: Every day | CUTANEOUS | Status: DC
  Administered 2024-01-15 – 2024-01-22 (×8): 6 via TOPICAL

## 2024-01-15 MED ORDER — HYDRALAZINE HCL 50 MG PO TABS
50.0000 mg | ORAL_TABLET | Freq: Three times a day (TID) | ORAL | Status: DC
  Administered 2024-01-15 – 2024-01-22 (×21): 50 mg via ORAL
  Filled 2024-01-15 (×21): qty 1

## 2024-01-15 MED ORDER — SODIUM CHLORIDE 0.9 % IV SOLN: INTRAVENOUS | Status: DC

## 2024-01-15 MED ORDER — ACETAMINOPHEN 325 MG PO TABS
650.0000 mg | ORAL_TABLET | Freq: Four times a day (QID) | ORAL | Status: DC | PRN
  Administered 2024-01-15 – 2024-01-17 (×2): 650 mg via ORAL
  Filled 2024-01-15 (×2): qty 2

## 2024-01-15 MED ORDER — TRAZODONE HCL 50 MG PO TABS
25.0000 mg | ORAL_TABLET | Freq: Every evening | ORAL | Status: DC | PRN
Start: 2024-01-15 — End: 2024-01-22
  Administered 2024-01-15 – 2024-01-21 (×3): 25 mg via ORAL
  Filled 2024-01-15 (×3): qty 1

## 2024-01-15 MED ORDER — ENSURE PLUS HIGH PROTEIN PO LIQD
237.0000 mL | Freq: Two times a day (BID) | ORAL | Status: DC
  Administered 2024-01-15 – 2024-01-17 (×4): 237 mL via ORAL

## 2024-01-15 MED ORDER — PANTOPRAZOLE SODIUM 40 MG IV SOLR
40.0000 mg | Freq: Two times a day (BID) | INTRAVENOUS | Status: DC
  Administered 2024-01-15 – 2024-01-18 (×7): 40 mg via INTRAVENOUS
  Filled 2024-01-15 (×7): qty 10

## 2024-01-15 MED ORDER — BUPRENORPHINE HCL-NALOXONE HCL 2-0.5 MG SL SUBL
1.0000 | SUBLINGUAL_TABLET | Freq: Two times a day (BID) | SUBLINGUAL | Status: DC
  Administered 2024-01-15 – 2024-01-17 (×5): 1 via SUBLINGUAL
  Filled 2024-01-15 (×4): qty 1

## 2024-01-15 MED ORDER — BUDESONIDE 0.25 MG/2ML IN SUSP
0.2500 mg | Freq: Two times a day (BID) | RESPIRATORY_TRACT | Status: DC
  Administered 2024-01-15 – 2024-01-16 (×2): 0.25 mg via RESPIRATORY_TRACT
  Filled 2024-01-15 (×4): qty 2

## 2024-01-15 MED ORDER — IPRATROPIUM-ALBUTEROL 0.5-2.5 (3) MG/3ML IN SOLN: 3.0000 mL | RESPIRATORY_TRACT | Status: DC | PRN

## 2024-01-15 MED ORDER — POTASSIUM CHLORIDE CRYS ER 20 MEQ PO TBCR
40.0000 meq | EXTENDED_RELEASE_TABLET | Freq: Once | ORAL | Status: AC
  Administered 2024-01-15: 40 meq via ORAL
  Filled 2024-01-15: qty 2

## 2024-01-15 MED ORDER — ACETAMINOPHEN 650 MG RE SUPP: 650.0000 mg | Freq: Four times a day (QID) | RECTAL | Status: DC | PRN

## 2024-01-15 MED ORDER — GUAIFENESIN 100 MG/5ML PO LIQD
100.0000 mg | ORAL | Status: DC | PRN
  Administered 2024-01-17 – 2024-01-18 (×2): 100 mg via ORAL
  Filled 2024-01-15 (×3): qty 10

## 2024-01-15 MED ORDER — IRBESARTAN 75 MG PO TABS
37.5000 mg | ORAL_TABLET | Freq: Every day | ORAL | Status: DC
  Administered 2024-01-15 – 2024-01-22 (×8): 37.5 mg via ORAL
  Filled 2024-01-15 (×8): qty 0.5

## 2024-01-15 MED ORDER — BUPRENORPHINE HCL-NALOXONE HCL 2-0.5 MG SL SUBL
1.0000 | SUBLINGUAL_TABLET | Freq: Every day | SUBLINGUAL | Status: DC
  Filled 2024-01-15: qty 1

## 2024-01-15 MED ORDER — SODIUM CHLORIDE 0.9 % IV BOLUS
1000.0000 mL | Freq: Once | INTRAVENOUS | Status: AC
  Administered 2024-01-15: 1000 mL via INTRAVENOUS

## 2024-01-15 MED ORDER — ONDANSETRON HCL 4 MG PO TABS: 4.0000 mg | ORAL_TABLET | Freq: Four times a day (QID) | ORAL | Status: DC | PRN

## 2024-01-15 MED ORDER — QUETIAPINE FUMARATE 25 MG PO TABS
100.0000 mg | ORAL_TABLET | Freq: Every day | ORAL | Status: DC
  Administered 2024-01-15 – 2024-01-21 (×7): 100 mg via ORAL
  Filled 2024-01-15 (×7): qty 4

## 2024-01-15 MED ORDER — PANTOPRAZOLE SODIUM 40 MG PO TBEC
40.0000 mg | DELAYED_RELEASE_TABLET | Freq: Every day | ORAL | Status: DC
  Administered 2024-01-15: 40 mg via ORAL
  Filled 2024-01-15: qty 1

## 2024-01-15 MED ORDER — MIRTAZAPINE 15 MG PO TABS
30.0000 mg | ORAL_TABLET | Freq: Every day | ORAL | Status: DC
  Administered 2024-01-15 – 2024-01-21 (×7): 30 mg via ORAL
  Filled 2024-01-15 (×7): qty 2

## 2024-01-15 MED ORDER — POTASSIUM CHLORIDE 10 MEQ/100ML IV SOLN
10.0000 meq | INTRAVENOUS | Status: AC
  Administered 2024-01-15: 10 meq via INTRAVENOUS
  Filled 2024-01-15: qty 100

## 2024-01-15 MED ORDER — MORPHINE SULFATE (PF) 2 MG/ML IV SOLN
2.0000 mg | INTRAVENOUS | Status: DC | PRN
  Administered 2024-01-15 – 2024-01-22 (×35): 2 mg via INTRAVENOUS
  Filled 2024-01-15 (×36): qty 1

## 2024-01-15 MED ORDER — ONDANSETRON HCL 4 MG/2ML IJ SOLN: 4.0000 mg | Freq: Four times a day (QID) | INTRAMUSCULAR | Status: DC | PRN

## 2024-01-15 MED ORDER — ENOXAPARIN SODIUM 40 MG/0.4ML IJ SOSY
40.0000 mg | PREFILLED_SYRINGE | INTRAMUSCULAR | Status: DC
  Filled 2024-01-15: qty 0.4

## 2024-01-15 MED ORDER — HYDROXYZINE HCL 25 MG PO TABS
25.0000 mg | ORAL_TABLET | Freq: Three times a day (TID) | ORAL | Status: DC | PRN
  Administered 2024-01-15 (×2): 25 mg via ORAL
  Filled 2024-01-15 (×2): qty 1

## 2024-01-15 MED ORDER — MAGNESIUM HYDROXIDE 400 MG/5ML PO SUSP: 30.0000 mL | Freq: Every day | ORAL | Status: DC | PRN

## 2024-01-15 NOTE — Evaluation (Signed)
 Occupational Therapy Evaluation Patient Details Name: Nicole [name] MRN: [id number] DOB: [birth year] Today's Date: 01/15/2024   History of Present Illness   Pt is a 63 year old female presenting with generalized weakness which has been worsening over the last couple of weeks with associated nausea and diminished appetite; admitted with hyponatremia,     PMH significant for anxiety, asthma, Bipolar disorder, COPD, Hepatitis C, hypertension and collagen vascular disease     Clinical Impressions Chart reviewed, pt greeted in bed, alert and oriented x4 however requires max encouragement for participation in OT evaluation. PTA pt reports she has not gotten out of bed in 4 days, has had a decrease in overall function in the last few months, amb short distances in her house before that. PLOF/home set up limited as pt provides information and then reports a different home set up later in the session and says I can't think about this right now. Will need to confirm. Pt presents with deficits in strength, endurance, activity tolerance, balance, cognition, affecting safe and optimal ADL completion. Pt will benefit from acute OT to address functional deficits and to facilitate optimal ADL performance. Pt is left as received, all needs met. OT will follow acutely.      If plan is discharge home, recommend the following:   A lot of help with walking and/or transfers;A lot of help with bathing/dressing/bathroom;Help with stairs or ramp for entrance;Assistance with cooking/housework;Direct supervision/assist for medications management;Direct supervision/assist for financial management     Functional Status Assessment   Patient has had a recent decline in their functional status and demonstrates the ability to make significant improvements in function in a reasonable and predictable amount of time.     Equipment Recommendations   Other (comment) (defer to next venue of care)      Recommendations for Other Services         Precautions/Restrictions   Precautions Precautions: Fall Recall of Precautions/Restrictions: Impaired     Mobility Bed Mobility Overal bed mobility: Needs Assistance Bed Mobility: Rolling, Supine to Sit Rolling: Mod assist, Used rails (step by step multi modal cues for technique/max encouragement)   Supine to sit: Mod assist (approx 1/2 towards sitting on edge of bed and pt abruptly reports she needs to lie down due to pain)     General bed mobility comments: supine>long sit with MOD-MAX A    Transfers                   General transfer comment: deferred on this date      Balance                                           ADL either performed or assessed with clinical judgement   ADL Overall ADL's : Needs assistance/impaired Eating/Feeding: Set up;Sitting Eating/Feeding Details (indicate cue type and reason): educated re: aspiration preacutions Grooming: Supervision/safety;Sitting       Lower Body Bathing: Maximal assistance       Lower Body Dressing: Maximal assistance       Toileting- Clothing Manipulation and Hygiene: Maximal assistance;Bed level               Vision Patient Visual Report: No change from baseline       Perception         Praxis         Pertinent Vitals/Pain Pain Assessment  Pain Assessment: 0-10 Pain Score: 10-Worst pain ever Pain Location: peri area Pain Descriptors / Indicators: Discomfort, Crying, Moaning Pain Intervention(s): Limited activity within patient's tolerance, Premedicated before session, Repositioned     Extremity/Trunk Assessment Upper Extremity Assessment Upper Extremity Assessment: Generalized weakness   Lower Extremity Assessment Lower Extremity Assessment: Generalized weakness       Communication Communication Communication: No apparent difficulties   Cognition Arousal: Alert Behavior During Therapy: Agitated,  Lability Cognition: No family/caregiver present to determine baseline, Cognition impaired     Awareness: Intellectual awareness impaired, Online awareness impaired   Attention impairment (select first level of impairment): Sustained attention Executive functioning impairment (select all impairments): Reasoning, Problem solving                   Following commands: Impaired Following commands impaired: Follows one step commands with increased time     Cueing  General Comments   Cueing Techniques: Verbal cues;Gestural cues;Tactile cues;Visual cues  significant redness/breakdown in peri area, L upper arm significantly bruised; spo2 98% on RA, HR 100 bpm after mobility attempts   Exercises Other Exercises Other Exercises: edu re: role of OT, roel of rehab, discharge recommendations, importance of progressing mobility for recovery/decrease further regression of status   Shoulder Instructions      Home Living Family/patient expects to be discharged to:: Private residence Living Arrangements: Spouse/significant other Available Help at Discharge: Family Type of Home: House Home Access: Stairs to enter [employer] of Steps: 3-4 but will need to confirm                       Additional Comments: at first pt reports multi level with sons living in the basement and her upstairs, but then she reports she lives on the main floor and there is only a basement, will need to confirm      Prior Functioning/Environment Prior Level of Function : History of Falls (last six months)             Mobility Comments: says she has been in bed for the last 4 days, before that amb around the house limited distances ADLs Comments: pt reports MOD I with ADL, assist for IADL, will need to confirm    OT Problem List: Decreased strength;Impaired balance (sitting and/or standing);Decreased cognition;Decreased activity tolerance;Decreased knowledge of use of DME or AE   OT  Treatment/Interventions: Self-care/ADL training;Therapeutic exercise;Patient/family education;Balance training;Energy conservation;Therapeutic activities;DME and/or AE instruction;Cognitive remediation/compensation      OT Goals(Current goals can be found in the care plan section)   Acute Rehab OT Goals Patient Stated Goal: decrease pain OT Goal Formulation: With patient Time For Goal Achievement: 01/29/24 Potential to Achieve Goals: Good ADL Goals Pt Will Perform Grooming: with modified independence;sitting;standing Pt Will Perform Lower Body Dressing: with modified independence;sit to/from stand;sitting/lateral leans Pt Will Transfer to Toilet: with modified independence;ambulating Pt Will Perform Toileting - Clothing Manipulation and hygiene: with modified independence;sitting/lateral leans;sit to/from stand   OT Frequency:  Min 2X/week    Co-evaluation              AM-PAC OT 6 Clicks Daily Activity     Outcome Measure Help from another person eating meals?: None Help from another person taking care of personal grooming?: None Help from another person toileting, which includes using toliet, bedpan, or urinal?: A Lot Help from another person bathing (including washing, rinsing, drying)?: A Lot Help from another person to put on and taking off regular upper body  clothing?: A Little Help from another person to put on and taking off regular lower body clothing?: A Lot 6 Click Score: 17   End of Session Nurse Communication: Mobility status  Activity Tolerance: Patient tolerated treatment well Patient left: in bed  OT Visit Diagnosis: Other abnormalities of gait and mobility (R26.89);Muscle weakness (generalized) (M62.81)                Time: 9062-9042 OT Time Calculation (min): 20 min Charges:  OT General Charges $OT Visit: 1 Visit OT Evaluation $OT Eval Moderate Complexity: 1 Mod  Therisa Sheffield, OTD OTR/L  01/15/24, 10:14 AM

## 2024-01-15 NOTE — Assessment & Plan Note (Addendum)
 Patient on hydralazine  and irbesartan .

## 2024-01-15 NOTE — Assessment & Plan Note (Addendum)
 Physical therapy recommending rehab.  Patient declined the 1 rehab bed offer that she got.

## 2024-01-15 NOTE — Assessment & Plan Note (Signed)
 Patient stated she quit drinking prior to admission.

## 2024-01-15 NOTE — Plan of Care (Signed)

## 2024-01-15 NOTE — Assessment & Plan Note (Addendum)
 With initial wheezing, continue nebulizer treatments as needed

## 2024-01-15 NOTE — Evaluation (Signed)
 Physical Therapy Evaluation Patient Details Name: Nicole [name] MRN: [id number] DOB: [birth year] Today's Date: 01/15/2024  History of Present Illness  Pt is a 63 y/o F admitted on 01/15/24 after presenting with c/o acute onset of generalized weakness that has been worsening over the last couple of weeks, associated with nausea & decreased appetite; pt also endorses significant alcohol intake. Pt is being treated for hyponatremia. PMH: anxiety, asthma, bipolar disorder, Hep C, COPD, HTN, collagen vascular disease, empyema  Clinical Impression  Pt seen for PT evaluation with pt received in bed. Pt very anxious re: pain, reports just receiving pain meds. PT attempts to educate pt on purpose of PT, OOB mobility. Pt agreeable to repositioning in bed; PT educated pt on need to reposition every 1-2 hours but pt will likely need reminders as she was unsure of when she repositioned last. Pt would benefit from ongoing PT services to progress mobility as able.      If plan is discharge home, recommend the following: A lot of help with walking and/or transfers;A lot of help with bathing/dressing/bathroom;Assist for transportation;Assistance with cooking/housework;Help with stairs or ramp for entrance   Can travel by private vehicle   No    Equipment Recommendations Other (comment) (defer to next venue)  Recommendations for Other Services  Rehab consult    Functional Status Assessment Patient has had a recent decline in their functional status and demonstrates the ability to make significant improvements in function in a reasonable and predictable amount of time.     Precautions / Restrictions Precautions Precautions: Fall Precaution/Restrictions Comments: reduce shearing forces on buttocks Restrictions Weight Bearing Restrictions Per Provider Order: No      Mobility  Bed Mobility Overal bed mobility: Needs Assistance Bed Mobility: Rolling Rolling: Supervision, Used rails          General bed mobility comments: Pt is able to roll L<>R with cuing re: use of bed rails. Pt bridges with decreased buttocks clearance but still able.    Transfers                        Ambulation/Gait                  Stairs            Wheelchair Mobility     Tilt Bed    Modified Rankin (Stroke Patients Only)       Balance                                             Pertinent Vitals/Pain Pain Assessment Pain Assessment: Faces Faces Pain Scale: Hurts whole lot Pain Location: peri area Pain Descriptors / Indicators: Discomfort, Crying, Moaning, Grimacing, Guarding Pain Intervention(s): Monitored during session, Repositioned    Home Living Family/patient expects to be discharged to:: Private residence Living Arrangements: Spouse/significant other Available Help at Discharge: Family Type of Home: House Home Access: Stairs to enter   [employer] of Steps: 3-4 but will need to confirm       Additional Comments: at first pt reports multi level with sons living in the basement and her upstairs, but then she reports she lives on the main floor and there is only a basement, will need to confirm    Prior Function Prior Level of Function : History of Falls (last six months)  Mobility Comments: says she has been in bed for the last 4 days, before that amb around the house limited distances, does report she did not use AD for gait, used to babysit (unsure of how recently) ADLs Comments: pt reports MOD I with ADL, assist for IADL, will need to confirm     Extremity/Trunk Assessment   Upper Extremity Assessment Upper Extremity Assessment: Generalized weakness    Lower Extremity Assessment Lower Extremity Assessment: Generalized weakness    Cervical / Trunk Assessment Cervical / Trunk Assessment:  (significant skin breakdown to anterior & posterior peri area)  Communication    Communication Communication: No apparent difficulties    Cognition Arousal: Alert Behavior During Therapy: Anxious   PT - Cognitive impairments: Difficult to assess Difficult to assess due to:  (2/2 anxiety during session)                     PT - Cognition Comments: perseverative on pain, PT encouraged deep breathing/relaxation techniques with little to no improvement   Following commands impaired: Follows one step commands with increased time     Cueing Cueing Techniques: Verbal cues, Gestural cues, Tactile cues, Visual cues     General Comments General comments (skin integrity, edema, etc.): significant redness/breakdown in peri area, L upper arm significantly bruised; spo2 98% on RA, HR 100 bpm after mobility attempts    Exercises     Assessment/Plan    PT Assessment Patient needs continued PT services  PT Problem List Decreased strength;Pain;Decreased range of motion;Decreased cognition;Decreased activity tolerance;Decreased balance;Decreased mobility;Decreased skin integrity;Decreased knowledge of precautions;Decreased safety awareness;Decreased knowledge of use of DME       PT Treatment Interventions DME instruction;Balance training;Neuromuscular re-education;Gait training;Stair training;Functional mobility training;Therapeutic activities;Therapeutic exercise;Manual techniques;Patient/family education;Modalities    PT Goals (Current goals can be found in the Care Plan section)  Acute Rehab PT Goals Patient Stated Goal: decreased pain PT Goal Formulation: With patient Time For Goal Achievement: 01/29/24 Potential to Achieve Goals: Good    Frequency Min 2X/week     Co-evaluation               AM-PAC PT 6 Clicks Mobility  Outcome Measure Help needed turning from your back to your side while in a flat bed without using bedrails?: A Little Help needed moving from lying on your back to sitting on the side of a flat bed without using bedrails?: A  Little Help needed moving to and from a bed to a chair (including a wheelchair)?: A Lot Help needed standing up from a chair using your arms (e.g., wheelchair or bedside chair)?: Total Help needed to walk in hospital room?: Total Help needed climbing 3-5 steps with a railing? : Total 6 Click Score: 11    End of Session   Activity Tolerance: Patient limited by pain Patient left: in bed;with call bell/phone within reach;with nursing/sitter in room;with bed alarm set   PT Visit Diagnosis: Muscle weakness (generalized) (M62.81);Other abnormalities of gait and mobility (R26.89);Pain;Difficulty in walking, not elsewhere classified (R26.2) Pain - part of body:  (peri area)    Time: 8952-8940 PT Time Calculation (min) (ACUTE ONLY): 12 min   Charges:   PT Evaluation $PT Eval Low Complexity: 1 Low   PT General Charges $$ ACUTE PT VISIT: 1 Visit         Richerd Pinal, PT, DPT 01/15/24, 11:42 AM   Richerd CHRISTELLA Pinal 01/15/2024, 11:41 AM

## 2024-01-15 NOTE — Assessment & Plan Note (Addendum)
 Sodium 134.

## 2024-01-15 NOTE — Assessment & Plan Note (Addendum)
 Buttock.  Cleanse buttocks/sacrum and posterior thighs and inner thighs with Vashe wound cleanser Soila [phone]) do not rinse and allowed to air dry.  Apply Gerhard's Butt cream 3 times a day.  Starting empiric Diflucan  and Rocephin .

## 2024-01-15 NOTE — ED Provider Notes (Signed)
 [facility] Provider Note    Event Date/Time   First MD Initiated Contact with Patient 01/15/24 [phone]     (approximate)  History   Chief Complaint: Fall  HPI  Nicole [name] is a 63 y.o. female with a past medical history of alcohol abuse, anxiety, bipolar, COPD, hypertension, presents to the emergency department for generalized weakness.  According to the patient of the past 2 weeks she has been feeling extremely weak and nauseated.  States she cannot get up or out of bed unless her husband lifts her.  Patient does admit to significant alcohol intake but states she is not drinking any alcohol in the past 4 days due to no appetite.  Patient states she has only had a pack of crackers today again due to lack of appetite.  Patient had a fall approximately 5 days ago and has older appearing bruising on her left arm.  Patient has ulcerated skin to her buttocks, states that has been bleeding and that her pants/clothing/sheets stick to her.  Physical Exam   Triage Vital Signs: ED Triage Vitals  Encounter Vitals Group     BP 01/14/24 2252 130/75     Girls Systolic BP Percentile --      Girls Diastolic BP Percentile --      Boys Systolic BP Percentile --      Boys Diastolic BP Percentile --      Pulse Rate 01/14/24 2252 (!) 119     Resp 01/14/24 2252 18     Temp 01/14/24 2252 97.8 F (36.6 C)     Temp Source 01/14/24 2252 Oral     SpO2 01/14/24 2236 98 %     Weight 01/14/24 2253 103 lb 6.3 oz (46.9 kg)     Height 01/14/24 2253 5' 2 (1.575 m)     Head Circumference --      Peak Flow --      Pain Score 01/14/24 2253 10     Pain Loc --      Pain Education --      Exclude from Growth Chart --     Most recent vital signs: Vitals:   01/14/24 2236 01/14/24 2252  BP:  130/75  Pulse:  (!) 119  Resp:  18  Temp:  97.8 F (36.6 C)  SpO2: 98% 95%    General: Awake, no distress.  Frail/cachectic in appearance. CV:  Good peripheral perfusion.  Regular  rhythm around 100 to 120 bpm. Resp:  Normal effort.  Equal breath sounds bilaterally.  Abd:  No distention.  Soft, nontender.  No rebound or guarding. Other:  Patient has significant skin breakdown across the buttocks bilaterally.   ED Results / Procedures / Treatments   MEDICATIONS ORDERED IN ED: Medications  sodium chloride  0.9 % bolus 1,000 mL (has no administration in time range)     IMPRESSION / MDM / ASSESSMENT AND PLAN / ED COURSE  I reviewed the triage vital signs and the nursing notes.  Patient's presentation is most consistent with acute presentation with potential threat to life or bodily function.  Patient presents to the emergency department for generalized weakness decreased appetite fatigue.  Patient states she has not been able to get out of bed or out of the chair without her husband physically lifting her up.  She has not been eating or drinking due to no appetite.  Patient's lab work today shows significant hyponatremia at 124.  Patient states she has not been eating drinking  or drinking alcohol.  Will start the patient on IV fluids of normal saline.  Patient CBC shows a slight leukocytosis but otherwise no concerning findings.  Patient has fairly significant skin breakdown across her buttocks bilaterally consistent with likely pressure related breakdown from being in bed and not moving.  Given the patient's weakness fatigue decreased appetite and hyponatremia we will admit to the hospitalist service start the patient on IV fluids and continue to closely monitor.  Patient agreeable to plan    FINAL CLINICAL IMPRESSION(S) / ED DIAGNOSES   Hyponatremia Weakness  Note:  This document was prepared using Dragon voice recognition software and may include unintentional dictation errors.   Dorothyann Drivers, MD 01/15/24 [phone]

## 2024-01-15 NOTE — Assessment & Plan Note (Addendum)
 Acute on chronic pain.  Switch Suboxone  over to buprenorphine .  Patient requiring extra pain medications for buttock pain.  Concerned that she is taking quite a bit of medications complaining of 10 out of 10 pain all the time.

## 2024-01-15 NOTE — Assessment & Plan Note (Addendum)
 Switched PPI back to oral twice daily

## 2024-01-15 NOTE — Assessment & Plan Note (Addendum)
 Improved.  Patient tolerating diet..  Protonix  ordered twice daily

## 2024-01-15 NOTE — Assessment & Plan Note (Addendum)
 Suspected upper GI bleed.  EGD showing duodenitis.  Hemoglobin 11.1 on presentation now 8.6 and stable.  (Last admission range between 13 and 15) continue to monitor closely.  GI recommends an outpatient colonoscopy.  B12 level high.  Patient also has lots of bruising and may have lost blood that way also.

## 2024-01-15 NOTE — Consult Note (Signed)
 WOC Nurse Consult Note: Reason for Consult: raw buttocks/vagina  Wound type: Moisture Associated Skin Damage sacrum/buttocks/posterior thighs  ICD-10 CM Codes for Irritant Dermatitis L24A2 - Due to fecal, urinary or dual incontinence Intertriginous dermatitis groin/inner thighs  L30.4  - Erythema intertrigo. Also used for abrasion of the hand, chafing of the skin, dermatitis due to sweating and friction, friction dermatitis, friction eczema, and genital/thigh intertrigo.  Pressure Injury POA: NA  Measurement: widespread buttocks, perineum, inner thighs  Wound bed: erythema with scattered partial thickness skin loss, question fungal component  Drainage (amount, consistency, odor) see nursing flowsheet  Periwound: Dressing procedure/placement/frequency: Cleanse buttocks/sacrum/posterior thighs, inner thighs and groin with Vashe wound cleanser Soila [phone]) do not rinse and allow to air dry.  Apply Gerhardt's Butt Cream 3 times a day to entire area and prn soiling.  May sprinkle over Gerhardt's with floor stock antifungal for extra drying effect.   POC discussed with bedside nurse. WOC team will not follow. Re-consult if further needs arise.   Thank you,    Powell Bar MSN, RN-BC, [employer]

## 2024-01-15 NOTE — Assessment & Plan Note (Signed)
 Continue Remeron  and Seroquel .

## 2024-01-15 NOTE — H&P (Addendum)
 [facility]   PATIENT NAME: Nicole [name]    MR#:  [id number]  DATE OF BIRTH:  [birth year]  DATE OF ADMISSION:  01/15/2024  PRIMARY CARE PHYSICIAN: Herold Hadassah SQUIBB, MD   Patient is coming from: Home  REQUESTING/REFERRING PHYSICIAN: Dorothyann Drivers, MD  CHIEF COMPLAINT:   Chief Complaint  Patient presents with   Fall    HISTORY OF PRESENT ILLNESS:  Nicole [name] is a 63 y.o. female with medical history significant for anxiety, asthma, Bipolar disorder, COPD, Hepatitis C, hypertension and collagen vascular disease, who presented to the emergency room with acute onset of generalized weakness which has been worsening over the last couple of weeks with associated nausea and diminished appetite.  She has become so weak she cannot get up or out of bed unless her husband left her.  She admits to significant alcohol intake but stated that she has not been drinking over the last 4 days due to anorexia.  She only ate a pack of crackers today.  5 days ago she had a fall with subsequent left arm bruising.  No nausea or vomiting or diarrhea.  She also has gluteal ulceration which has been bleeding.  No fever or chills.  No chest pain or palpitations.  No cough or wheezing or dyspnea.  No chest pain or palpitations.  No dysuria, oliguria or hematuria or flank pain.  She has been having recurrent falls without injuries.  She drinks about a wine cooler daily.  ED Course: When she came to the ER, BP was 130/75 with heart rate 119 with otherwise normal vital signs.  Labs revealed hyponatremia 124 and hypochloremia of 87 with a blood glucose of 155, BUN of 31 and creatinine 1.18 with albumin 2.6 and total protein 6.3.  AST was 70 and ALT 34.  CBC showed leukocytosis of 14.8 and anemia lower than previous levels with hemoglobin 11.1 hematocrit 30.1 compared to 15.3 and 43.4 on 01/07/2024.  Blood group was O+ with positive antibody screen.  TSH was 1.85. EKG as reviewed by me : None Imaging:  Portable chest x-ray showed the following: 1. No radiographic evidence of acute cardiopulmonary disease. 2. Chronic findings in the lungs concerning for potential interstitial lung disease. Outpatient referral to Pulmonology for further clinical evaluation is recommended. Follow-up nonemergent high-resolution chest CT should be considered if clinically appropriate. 3. Aortic atherosclerosis.  The patient was given 1 L bolus of IV normal saline.  She will be admitted to a medical telemetry bed for further evaluation and management. PAST MEDICAL HISTORY:   Past Medical History:  Diagnosis Date   Alcohol withdrawal (HCC) 01/28/2022   Anxiety    Arthritis    Asthma    Bipolar disorder (HCC)    Collagen vascular disease (HCC)    COPD (chronic obstructive pulmonary disease) (HCC)    Empyema (HCC) 09/05/2020   Hepatitis C    Hypertension    Substance abuse (HCC)     PAST SURGICAL HISTORY:   Past Surgical History:  Procedure Laterality Date   ABDOMINAL HYSTERECTOMY     TONSILLECTOMY     TONSILLECTOMY      SOCIAL HISTORY:   Social History   Tobacco Use   Smoking status: Every Day    Current packs/day: 2.00    Average packs/day: 2.0 packs/day for 33.0 years (66.0 ttl pk-yrs)    Types: Cigarettes   Smokeless tobacco: Never   Tobacco comments:    1PPD 02/19/2022, currently using nicotine  patches, patient  also occasionally uses daughter's vape    Currently drinking 1 wine cooler 2 times per week, no liquor since 01/30/22  Substance Use Topics   Alcohol use: Yes    Alcohol/week: 4.0 standard drinks of alcohol    Types: 4 Standard drinks or equivalent per week    Comment: 1 pint a day of brown liquor with pepsi    FAMILY HISTORY:   Family History  Problem Relation Age of Onset   Depression Mother    Hypertension Mother    Heart attack Mother    Insomnia Mother    Thyroid disease Mother    Bipolar disorder Mother    Other Father        Parkinson's disease    Parkinson's disease Father    Arthritis Maternal Grandmother    Bipolar disorder Maternal Grandmother    Depression Maternal Grandmother    Other Maternal Grandfather        unknown medical history   Heart attack Paternal Grandmother    Ovarian cancer Paternal Grandmother    Hyperlipidemia Paternal Grandfather    Hypertension Paternal Grandfather    Congestive Heart Failure Paternal Grandfather    Rheum arthritis Other    Osteoporosis Other    Clotting disorder Other    Stroke Other     DRUG ALLERGIES:  No Known Allergies  REVIEW OF SYSTEMS:   ROS As per history of present illness. All pertinent systems were reviewed above. Constitutional, HEENT, cardiovascular, respiratory, GI, GU, musculoskeletal, neuro, psychiatric, endocrine, integumentary and hematologic systems were reviewed and are otherwise negative/unremarkable except for positive findings mentioned above in the HPI.   MEDICATIONS AT HOME:   Prior to Admission medications   Medication Sig Start Date End Date Taking? Authorizing Provider  albuterol  (PROVENTIL  HFA) 108 (90 Base) MCG/ACT inhaler Inhale 1-2 puffs into the lungs once every 6 (six) hours as needed for wheezing or shortness of breath. 07/30/22   Medina-Vargas, Monina C, NP  Blood Pressure KIT USE AS DIRECTED TWICE DAILY. 10/03/21   Iloabachie, Chioma E, NP  Buprenorphine  HCl-Naloxone  HCl 8-2 MG FILM Place under the tongue 2 (two) times daily.    [provider]  doxycycline  (VIBRA -TABS) 100 MG tablet Take 1 tablet (100 mg total) by mouth 2 (two) times daily for 10 days. 01/07/24 01/17/24  Herold Hadassah SQUIBB, MD  guaiFENesin  (ROBITUSSIN) 100 MG/5ML liquid Take 5 mLs (100 mg total) by mouth every 4 (four) hours as needed for cough or to loosen phlegm. Take during the day 12/12/21   Iloabachie, Chioma E, NP  hydrALAZINE  (APRESOLINE ) 50 MG tablet Take 1 tablet (50 mg total) by mouth 3 (three) times daily. APPOINTMENT OVERDUE 12/26/22   Medina-Vargas, Monina C, NP   hydrOXYzine  (ATARAX ) 25 MG tablet SMARTSIG:1 Tablet(s) By Mouth Every 12 Hours PRN 12/20/23   [provider]  mirtazapine  (REMERON ) 30 MG tablet Take 30 mg by mouth at bedtime. 12/20/23   [provider]  naltrexone  (DEPADE) 50 MG tablet Take 1 tablet (50 mg total) by mouth daily. 01/07/24   Herold Hadassah SQUIBB, MD  naproxen  (NAPROSYN ) 250 MG tablet Take 250 mg by mouth 2 (two) times daily as needed for mild pain or moderate pain.    [provider]  olmesartan  (BENICAR ) 20 MG tablet Take 1 tablet (20 mg total) by mouth daily. 01/07/24   Herold Hadassah SQUIBB, MD  omeprazole (PRILOSEC) 40 MG capsule Take by mouth 2 (two) times daily. 11/19/23   [provider]  QUEtiapine  (SEROQUEL ) 100  MG tablet Take 1 tablet (100 mg total) by mouth at bedtime. APPOINTMENT OVERDUE 12/25/22   Medina-Vargas, Monina C, NP      VITAL SIGNS:  Blood pressure (!) 145/97, pulse 87, temperature 97.7 F (36.5 C), resp. rate 18, height 5' 2 (1.575 m), weight 46.9 kg, SpO2 100%.  PHYSICAL EXAMINATION:  Physical Exam  GENERAL:  63 y.o.-year-old patient lying in the bed with no acute distress.  EYES: Pupils equal, round, reactive to light and accommodation. No scleral icterus. Extraocular muscles intact.  HEENT: Head atraumatic, normocephalic. Oropharynx with slightly dry mucous membrane and tongue and nasopharynx clear.  NECK:  Supple, no jugular venous distention. No thyroid enlargement, no tenderness.  LUNGS: Normal breath sounds bilaterally, no wheezing, rales,rhonchi or crepitation. No use of accessory muscles of respiration.  CARDIOVASCULAR: Regular rate and rhythm, S1, S2 normal. No murmurs, rubs, or gallops.  ABDOMEN: Soft, nondistended, nontender. Bowel sounds present. No organomegaly or mass.  EXTREMITIES: No pedal edema, cyanosis, or clubbing.  NEUROLOGIC: Cranial nerves II through XII are intact. Muscle strength 5/5 in all extremities. Sensation intact. Gait not checked.  PSYCHIATRIC:  The patient is alert and oriented x 3.  Normal affect and good eye contact. SKIN: No obvious rash, lesion, or ulcer.   LABORATORY PANEL:   CBC Recent Labs  Lab 01/14/24 2255  WBC 14.8*  HGB 11.1*  HCT 30.1*  PLT 334   ------------------------------------------------------------------------------------------------------------------  Chemistries  Recent Labs  Lab 01/14/24 2255  NA 124*  K 3.7  CL 87*  CO2 24  GLUCOSE 155*  BUN 31*  CREATININE 1.18*  CALCIUM 9.1  AST 70*  ALT 34  ALKPHOS 82  BILITOT 1.2   ------------------------------------------------------------------------------------------------------------------  Cardiac Enzymes No results for input(s): TROPONINI in the last 168 hours. ------------------------------------------------------------------------------------------------------------------  RADIOLOGY:  No results found.    IMPRESSION AND PLAN:  Assessment and Plan: Hyponatremia - The patient be admitted to a medical telemetry bed. - Will obtain hyponatremia workup. - She will be hydrated with IV normal saline and will limit p.o. fluid intake. - Will follow sodium levels.  Generalized weakness - This is likely related to #1 and is associated with deconditioning and dehydration with recent anorexia and volume depletion. - Management as above and below..  Intractable nausea and vomiting - She was placed on as needed antiemetics. - She will be hydrated with IV normal saline.  Depression - Will continue Remeron  and Seroquel .  GERD without esophagitis - Will continue PPI therapy.  Essential hypertension - We will continue antihypertensive therapy.    DVT prophylaxis: Lovenox .  Advanced Care Planning:  Code Status: full code.  Family Communication:  The plan of care was discussed in details with the patient (and family). I answered all questions. The patient agreed to proceed with the above mentioned plan. Further management will depend  upon hospital course. Disposition Plan: Back to previous home environment Consults called: none.  All the records are reviewed and case discussed with ED provider.  Status is: Inpatient  At the time of the admission, it appears that the appropriate admission status for this patient is inpatient.  This is judged to be reasonable and necessary in order to provide the required intensity of service to ensure the patient's safety given the presenting symptoms, physical exam findings and initial radiographic and laboratory data in the context of comorbid conditions.  The patient requires inpatient status due to high intensity of service, high risk of further deterioration and high frequency of surveillance required.  I  certify that at the time of admission, it is my clinical judgment that the patient will require inpatient hospital care extending more than 2 midnights.                            Dispo: The patient is from: Home              Anticipated d/c is to: Home              Patient currently is not medically stable to d/c.              Difficult to place patient: No  Madison DELENA Peaches M.D on 01/15/2024 at 4:05 AM  [facility]   From 7 PM-7 AM, contact night-coverage [email or web address]  CC: Primary care physician; Herold Hadassah SQUIBB, MD

## 2024-01-15 NOTE — Progress Notes (Signed)
 Progress Note   Patient: Nicole [name] FMW:[id number] DOB: 07-17-1960 DOA: 01/15/2024     0 DOS: the patient was seen and examined on 01/15/2024   Brief hospital course: 63 y.o. female with medical history significant for anxiety, asthma, Bipolar disorder, COPD, Hepatitis C, hypertension and collagen vascular disease, who presented to the emergency room with acute onset of generalized weakness which has been worsening over the last couple of weeks with associated nausea and diminished appetite.  She has become so weak she cannot get up or out of bed unless her husband left her.  She admits to significant alcohol intake but stated that she has not been drinking over the last 4 days due to anorexia.  She only ate a pack of crackers today.  5 days ago she had a fall with subsequent left arm bruising.  No nausea or vomiting or diarrhea.  She also has gluteal ulceration which has been bleeding.  No fever or chills.  No chest pain or palpitations.  No cough or wheezing or dyspnea.  No chest pain or palpitations.  No dysuria, oliguria or hematuria or flank pain.  She has been having recurrent falls without injuries.  She drinks about a wine cooler daily.   ED Course: When she came to the ER, BP was 130/75 with heart rate 119 with otherwise normal vital signs.  Labs revealed hyponatremia 124 and hypochloremia of 87 with a blood glucose of 155, BUN of 31 and creatinine 1.18 with albumin 2.6 and total protein 6.3.  AST was 70 and ALT 34.  CBC showed leukocytosis of 14.8 and anemia lower than previous levels with hemoglobin 11.1 hematocrit 30.1 compared to 15.3 and 43.4 on 01/07/2024.  Blood group was O+ with positive antibody screen.  TSH was 1.85. EKG as reviewed by me : None Imaging: Portable chest x-ray showed the following: 1. No radiographic evidence of acute cardiopulmonary disease. 2. Chronic findings in the lungs concerning for potential interstitial lung disease. Outpatient referral to Pulmonology  for further clinical evaluation is recommended. Follow-up nonemergent high-resolution chest CT should be considered if clinically appropriate. 3. Aortic atherosclerosis.   The patient was given 1 L bolus of IV normal saline.  She will be admitted to a medical telemetry bed for further evaluation and management.  7/16.  Patient asking about her Suboxone  and complaining of her buttock being painful.  Patient stopped eating for few days.  With her drop in hemoglobin I asked her about any bleeding.  Patient stated she had some bleeding about a week ago.  Husband stated it was less than that and he thinks it was vaginal bleeding.  Patient had a total abdominal hysterectomy previously.  Assessment and Plan: Drop in hemoglobin Hemoglobin 11.1 on presentation now down to 9.5.  Continue to monitor closely.  Husband states there may have been vaginal bleeding.  Will put on IV Protonix .  Hyponatremia Gentle fluids today.  Encouraged to eat.  Sodium 127.  Generalized weakness Physical therapy recommending rehab  Intractable nausea and vomiting As needed nausea medications.  Irritant dermatitis Buttock.  Cleanse buttocks/sacrum and posterior thighs and inner thighs with Vashe wound cleanser Soila [phone]) do not rinse and allowed to air dry.  Apply Gerhard's Butt cream 3 times a day  COPD (chronic obstructive pulmonary disease) (HCC) With wheezing.  Start nebulizer treatments.  Chronic pain On Suboxone  twice daily  Depression - Will continue Remeron  and Seroquel .  GERD without esophagitis Switch PPI to IV twice daily.  Essential hypertension  Patient on hydralazine  and irbesartan .  Alcohol abuse Patient stated she Drinking 5 days ago.        Subjective: Patient states that she stopped eating.  She has been having falls.  Not feeling well.  Did state that couple days ago she did see some blood in the bowel movements.  Physical Exam: Vitals:   01/14/24 2253 01/15/24 0123  01/15/24 0217 01/15/24 0812  BP:  109/79 (!) 145/97 118/67  Pulse:  92 87 83  Resp:  12 18 16   Temp:   97.7 F (36.5 C) 98.2 F (36.8 C)  TempSrc:    Oral  SpO2:  100% 100% 100%  Weight: 46.9 kg     Height: 5' 2 (1.575 m)      Physical Exam HENT:     Head: Normocephalic.     Mouth/Throat:     Pharynx: No oropharyngeal exudate.  Eyes:     General: Lids are normal.     Conjunctiva/sclera: Conjunctivae normal.  Cardiovascular:     Rate and Rhythm: Normal rate and regular rhythm.     Heart sounds: Normal heart sounds, S1 normal and S2 normal.  Pulmonary:     Breath sounds: Examination of the right-middle field reveals wheezing. Examination of the left-middle field reveals wheezing. Examination of the right-lower field reveals decreased breath sounds and wheezing. Examination of the left-lower field reveals decreased breath sounds and wheezing. Decreased breath sounds and wheezing present. No rhonchi or rales.  Abdominal:     Palpations: Abdomen is soft.     Tenderness: There is no abdominal tenderness.  Musculoskeletal:     Right lower leg: No swelling.     Left lower leg: No swelling.  Skin:    Comments: Raw looking area entire buttock area  Neurological:     Mental Status: She is alert and oriented to person, place, and time.     Data Reviewed: Sodium 127, potassium 2.9, chloride 96, glucose 151, creatinine 1.17, magnesium  2.3, GFR 53, white blood cell count 15.6, hemoglobin 9.5, platelet count 245  Family Communication: Updated husband on the phone  Disposition: Status is: Inpatient Remains inpatient appropriate because: Physical therapy recommending rehab.  Watch closely for hemoglobin drop from previous hospitalization  Planned Discharge Destination: Rehab    Time spent: 28 minutes  Author: Charlie Patterson, MD 01/15/2024 3:47 PM  For on call review [email or web address].

## 2024-01-15 NOTE — Hospital Course (Addendum)
 63 y.o. female with medical history significant for anxiety, asthma, Bipolar disorder, COPD, Hepatitis C, hypertension and collagen vascular disease, who presented to the emergency room with acute onset of generalized weakness which has been worsening over the last couple of weeks with associated nausea and diminished appetite.  She has become so weak she cannot get up or out of bed unless her husband left her.  She admits to significant alcohol intake but stated that she has not been drinking over the last 4 days due to anorexia.  She only ate a pack of crackers today.  5 days ago she had a fall with subsequent left arm bruising.  No nausea or vomiting or diarrhea.  She also has gluteal ulceration which has been bleeding.  No fever or chills.  No chest pain or palpitations.  No cough or wheezing or dyspnea.  No chest pain or palpitations.  No dysuria, oliguria or hematuria or flank pain.  She has been having recurrent falls without injuries.  She drinks about a wine cooler daily.   ED Course: When she came to the ER, BP was 130/75 with heart rate 119 with otherwise normal vital signs.  Labs revealed hyponatremia 124 and hypochloremia of 87 with a blood glucose of 155, BUN of 31 and creatinine 1.18 with albumin 2.6 and total protein 6.3.  AST was 70 and ALT 34.  CBC showed leukocytosis of 14.8 and anemia lower than previous levels with hemoglobin 11.1 hematocrit 30.1 compared to 15.3 and 43.4 on 01/07/2024.  Blood group was O+ with positive antibody screen.  TSH was 1.85. EKG as reviewed by me : None Imaging: Portable chest x-ray showed the following: 1. No radiographic evidence of acute cardiopulmonary disease. 2. Chronic findings in the lungs concerning for potential interstitial lung disease. Outpatient referral to Pulmonology for further clinical evaluation is recommended. Follow-up nonemergent high-resolution chest CT should be considered if clinically appropriate. 3. Aortic atherosclerosis.   The  patient was given 1 L bolus of IV normal saline.  She will be admitted to a medical telemetry bed for further evaluation and management.  7/16.  Patient asking about her Suboxone  and complaining of her buttock being painful.  Patient stopped eating for few days.  With her drop in hemoglobin I asked her about any bleeding.  Patient stated she had some bleeding about a week ago.  Husband stated it was less than that and he thinks it was vaginal bleeding.  Patient had a total abdominal hysterectomy previously. 7/17 EGD showing duodenitis and no active bleeding.  Morning's hemoglobin was 8.5 and the afternoon was 9.1 7/18.  Patient complaining of a lot of buttock pain.  Discontinued the naltrexone  part of her Suboxone  to get better pain control. 7/19.  Low-grade fever.  This morning she felt like she was going to freeze and also had sweating.  Will get blood cultures and urine analysis.  Chest x-ray negative.  Will start empiric Rocephin  and Diflucan .  Will also send viral respiratory panel and COVID test.

## 2024-01-16 ENCOUNTER — Inpatient Hospital Stay: Admitting: Anesthesiology

## 2024-01-16 ENCOUNTER — Encounter
Admission: EM | Disposition: A | Discharge status: Home / Self Care | Payer: Self-pay | Source: Home / Self Care | Attending: Internal Medicine

## 2024-01-16 ENCOUNTER — Encounter: Payer: Self-pay | Admitting: Family Medicine

## 2024-01-16 DIAGNOSIS — R634 Abnormal weight loss: Secondary | ICD-10-CM

## 2024-01-16 DIAGNOSIS — D649 Anemia, unspecified: Secondary | ICD-10-CM

## 2024-01-16 DIAGNOSIS — R112 Nausea with vomiting, unspecified: Secondary | ICD-10-CM | POA: Diagnosis not present

## 2024-01-16 DIAGNOSIS — I10 Essential (primary) hypertension: Secondary | ICD-10-CM | POA: Diagnosis not present

## 2024-01-16 DIAGNOSIS — D62 Acute posthemorrhagic anemia: Secondary | ICD-10-CM

## 2024-01-16 DIAGNOSIS — R7401 Elevation of levels of liver transaminase levels: Secondary | ICD-10-CM | POA: Diagnosis not present

## 2024-01-16 DIAGNOSIS — K269 Duodenal ulcer, unspecified as acute or chronic, without hemorrhage or perforation: Secondary | ICD-10-CM | POA: Diagnosis not present

## 2024-01-16 DIAGNOSIS — G894 Chronic pain syndrome: Secondary | ICD-10-CM | POA: Diagnosis not present

## 2024-01-16 DIAGNOSIS — F1011 Alcohol abuse, in remission: Secondary | ICD-10-CM | POA: Diagnosis not present

## 2024-01-16 DIAGNOSIS — F17210 Nicotine dependence, cigarettes, uncomplicated: Secondary | ICD-10-CM | POA: Diagnosis not present

## 2024-01-16 DIAGNOSIS — K2980 Duodenitis without bleeding: Secondary | ICD-10-CM | POA: Diagnosis not present

## 2024-01-16 DIAGNOSIS — R710 Precipitous drop in hematocrit: Secondary | ICD-10-CM | POA: Diagnosis not present

## 2024-01-16 DIAGNOSIS — J439 Emphysema, unspecified: Secondary | ICD-10-CM | POA: Diagnosis not present

## 2024-01-16 DIAGNOSIS — F3289 Other specified depressive episodes: Secondary | ICD-10-CM | POA: Diagnosis not present

## 2024-01-16 DIAGNOSIS — E871 Hypo-osmolality and hyponatremia: Secondary | ICD-10-CM | POA: Diagnosis not present

## 2024-01-16 DIAGNOSIS — R531 Weakness: Secondary | ICD-10-CM | POA: Diagnosis not present

## 2024-01-16 DIAGNOSIS — L249 Irritant contact dermatitis, unspecified cause: Secondary | ICD-10-CM | POA: Diagnosis not present

## 2024-01-16 HISTORY — PX: ESOPHAGOGASTRODUODENOSCOPY: SHX5428

## 2024-01-16 LAB — FERRITIN: Ferritin: 312 ng/mL — ABNORMAL HIGH (ref 11–307)

## 2024-01-16 LAB — BASIC METABOLIC PANEL WITH GFR
Anion gap: 6 (ref 5–15)
BUN: 27 mg/dL — ABNORMAL HIGH (ref 8–23)
CO2: 20 mmol/L — ABNORMAL LOW (ref 22–32)
Calcium: 8.4 mg/dL — ABNORMAL LOW (ref 8.9–10.3)
Chloride: 104 mmol/L (ref 98–111)
Creatinine, Ser: 1.09 mg/dL — ABNORMAL HIGH (ref 0.44–1.00)
GFR, Estimated: 58 mL/min — ABNORMAL LOW (ref >60)
Glucose, Bld: 125 mg/dL — ABNORMAL HIGH (ref 70–99)
Potassium: 3.6 mmol/L (ref 3.5–5.1)
Sodium: 130 mmol/L — ABNORMAL LOW (ref 135–145)

## 2024-01-16 LAB — CBC
HCT: 23.9 % — ABNORMAL LOW (ref 36.0–46.0)
Hemoglobin: 8.5 g/dL — ABNORMAL LOW (ref 12.0–15.0)
MCH: 35.4 pg — ABNORMAL HIGH (ref 26.0–34.0)
MCHC: 35.6 g/dL (ref 30.0–36.0)
MCV: 99.6 fL (ref 80.0–100.0)
Platelets: 243 K/uL (ref 150–400)
RBC: 2.4 MIL/uL — ABNORMAL LOW (ref 3.87–5.11)
RDW: 13.5 % (ref 11.5–15.5)
WBC: 9 K/uL (ref 4.0–10.5)
nRBC: 0.2 % (ref 0.0–0.2)

## 2024-01-16 LAB — HEMOGLOBIN: Hemoglobin: 9.1 g/dL — ABNORMAL LOW (ref 12.0–15.0)

## 2024-01-16 SURGERY — EGD (ESOPHAGOGASTRODUODENOSCOPY)
Anesthesia: General

## 2024-01-16 MED ORDER — PROPOFOL 500 MG/50ML IV EMUL
INTRAVENOUS | Status: DC | PRN
  Administered 2024-01-16: 145 ug/kg/min via INTRAVENOUS

## 2024-01-16 MED ORDER — GLYCOPYRROLATE 0.2 MG/ML IJ SOLN
INTRAMUSCULAR | Status: DC | PRN
  Administered 2024-01-16: .2 mg via INTRAVENOUS

## 2024-01-16 MED ORDER — MIDAZOLAM HCL 5 MG/5ML IJ SOLN
INTRAMUSCULAR | Status: DC | PRN
  Administered 2024-01-16: 2 mg via INTRAVENOUS

## 2024-01-16 MED ORDER — SODIUM CHLORIDE 0.9% FLUSH: 500.0000 mL | Freq: Once | INTRAVENOUS | Status: DC

## 2024-01-16 MED ORDER — LIDOCAINE HCL (CARDIAC) PF 100 MG/5ML IV SOSY
PREFILLED_SYRINGE | INTRAVENOUS | Status: DC | PRN
  Administered 2024-01-16: 60 mg via INTRAVENOUS

## 2024-01-16 MED ORDER — PROPOFOL 10 MG/ML IV BOLUS
INTRAVENOUS | Status: DC | PRN
  Administered 2024-01-16: 40 mg via INTRAVENOUS

## 2024-01-16 MED ORDER — SODIUM CHLORIDE 0.9 % IV SOLN: INTRAVENOUS | Status: AC

## 2024-01-16 MED ORDER — MIDAZOLAM HCL 2 MG/2ML IJ SOLN
INTRAMUSCULAR | Status: AC
Start: 2024-01-16 — End: 2024-01-16
  Filled 2024-01-16: qty 2

## 2024-01-16 NOTE — Progress Notes (Signed)
 Progress Note   Patient: Nicole [name] FMW:[id number] DOB: 19-Jun-1961 DOA: 01/15/2024     1 DOS: the patient was seen and examined on 01/16/2024   Brief hospital course: 63 y.o. female with medical history significant for anxiety, asthma, Bipolar disorder, COPD, Hepatitis C, hypertension and collagen vascular disease, who presented to the emergency room with acute onset of generalized weakness which has been worsening over the last couple of weeks with associated nausea and diminished appetite.  She has become so weak she cannot get up or out of bed unless her husband left her.  She admits to significant alcohol intake but stated that she has not been drinking over the last 4 days due to anorexia.  She only ate a pack of crackers today.  5 days ago she had a fall with subsequent left arm bruising.  No nausea or vomiting or diarrhea.  She also has gluteal ulceration which has been bleeding.  No fever or chills.  No chest pain or palpitations.  No cough or wheezing or dyspnea.  No chest pain or palpitations.  No dysuria, oliguria or hematuria or flank pain.  She has been having recurrent falls without injuries.  She drinks about a wine cooler daily.   ED Course: When she came to the ER, BP was 130/75 with heart rate 119 with otherwise normal vital signs.  Labs revealed hyponatremia 124 and hypochloremia of 87 with a blood glucose of 155, BUN of 31 and creatinine 1.18 with albumin 2.6 and total protein 6.3.  AST was 70 and ALT 34.  CBC showed leukocytosis of 14.8 and anemia lower than previous levels with hemoglobin 11.1 hematocrit 30.1 compared to 15.3 and 43.4 on 01/07/2024.  Blood group was O+ with positive antibody screen.  TSH was 1.85. EKG as reviewed by me : None Imaging: Portable chest x-ray showed the following: 1. No radiographic evidence of acute cardiopulmonary disease. 2. Chronic findings in the lungs concerning for potential interstitial lung disease. Outpatient referral to Pulmonology  for further clinical evaluation is recommended. Follow-up nonemergent high-resolution chest CT should be considered if clinically appropriate. 3. Aortic atherosclerosis.   The patient was given 1 L bolus of IV normal saline.  She will be admitted to a medical telemetry bed for further evaluation and management.  7/16.  Patient asking about her Suboxone  and complaining of her buttock being painful.  Patient stopped eating for few days.  With her drop in hemoglobin I asked her about any bleeding.  Patient stated she had some bleeding about a week ago.  Husband stated it was less than that and he thinks it was vaginal bleeding.  Patient had a total abdominal hysterectomy previously.  Assessment and Plan: Drop in hemoglobin Suspect upper GI bleed.  Hemoglobin 11.1 on presentation now down to 8.5.  (Last admission range between 13 and 15) continue to monitor closely.  Check another hemoglobin now and if less than 8 I will give a unit of blood.  Consulted gastroenterology for EGD with patient having black stools overnight.  Continue IV Protonix   Hyponatremia Sodium 130.  Generalized weakness Physical therapy recommending rehab  Intractable nausea and vomiting As needed nausea medications.  Protonix  IV twice daily  Irritant dermatitis Buttock.  Cleanse buttocks/sacrum and posterior thighs and inner thighs with Vashe wound cleanser Soila [phone]) do not rinse and allowed to air dry.  Apply Gerhard's Butt cream 3 times a day  COPD (chronic obstructive pulmonary disease) (HCC) With wheezing, continue nebulizer treatments  Chronic pain  On Suboxone  twice daily  Depression - Will continue Remeron  and Seroquel .  GERD without esophagitis Switched PPI to IV twice daily.  Essential hypertension Patient on hydralazine  and irbesartan .  Alcohol abuse Patient stated she quit drinking 6 days ago.        Subjective: Patient's hemoglobin drifted down to 8.5.  Had 2 black stools overnight.   Started on IV Protonix  yesterday.  N.p.o. for potential EGD today.  Physical Exam: Vitals:   01/15/24 1955 01/15/24 2040 01/16/24 0356 01/16/24 0852  BP: 132/83  137/84 (!) 148/83  Pulse: 95  94 90  Resp: 18  14 18   Temp: 98.5 F (36.9 C)  99.3 F (37.4 C) 98.5 F (36.9 C)  TempSrc:      SpO2: 100% 100% 100% 98%  Weight:      Height:       Physical Exam HENT:     Head: Normocephalic.     Mouth/Throat:     Pharynx: No oropharyngeal exudate.  Eyes:     General: Lids are normal.     Conjunctiva/sclera: Conjunctivae normal.  Cardiovascular:     Rate and Rhythm: Normal rate and regular rhythm.     Heart sounds: Normal heart sounds, S1 normal and S2 normal.  Pulmonary:     Breath sounds: Examination of the right-lower field reveals decreased breath sounds and wheezing. Examination of the left-lower field reveals decreased breath sounds and wheezing. Decreased breath sounds and wheezing present. No rhonchi or rales.  Abdominal:     Palpations: Abdomen is soft.     Tenderness: There is no abdominal tenderness.  Musculoskeletal:     Right lower leg: No swelling.     Left lower leg: No swelling.  Skin:    Comments: Raw looking area entire buttock area  Neurological:     Mental Status: She is alert and oriented to person, place, and time.     Data Reviewed: Creatinine 1.09 with a GFR 58, sodium 130, potassium 3.6, ferritin 312, hemoglobin 8.5  Family Communication: Updated husband on the phone  Disposition: Status is: Inpatient Remains inpatient appropriate because: With drop in hemoglobin may end up needing to give blood if it drops lower than 8.0 I will give a unit of blood.  Consulted gastroenterology for possible EGD today with black stools overnight.  Planned Discharge Destination: Home    Time spent: 28 minutes  Author: Charlie Patterson, MD 01/16/2024 1:44 PM  For on call review [email or web address].

## 2024-01-16 NOTE — Transfer of Care (Signed)
 Immediate Anesthesia Transfer of Care Note  Patient: Nicole [name]  Procedure(s) Performed: EGD (ESOPHAGOGASTRODUODENOSCOPY)  Patient Location: Endoscopy Unit  Anesthesia Type:General  Level of Consciousness: drowsy and patient cooperative  Airway & Oxygen Therapy: Patient Spontanous Breathing and Patient connected to face mask oxygen  Post-op Assessment: Report given to RN and Post -op Vital signs reviewed and stable  Post vital signs: Reviewed and stable  Last Vitals:  Vitals Value Taken Time  BP 132/87 01/16/24 15:32  Temp 36 C 01/16/24 15:32  Pulse 128 01/16/24 15:34  Resp 21 01/16/24 15:34  SpO2 100 % 01/16/24 15:34  Vitals shown include unfiled device data.  Last Pain:  Vitals:   01/16/24 1532  TempSrc: Temporal  PainSc:       Patients Stated Pain Goal: 2 (01/16/24 1201)  Complications: No notable events documented.

## 2024-01-16 NOTE — Anesthesia Preprocedure Evaluation (Signed)
 Anesthesia Evaluation  Patient identified by MRN, date of birth, ID band Patient awake    Reviewed: Allergy & Precautions, H&P , NPO status , Patient's Chart, lab work & pertinent test results, reviewed documented beta blocker date and time   History of Anesthesia Complications Negative for: history of anesthetic complications  Airway Mallampati: IV  TM Distance: >3 FB Neck ROM: full    Dental  (+) Edentulous Upper, Poor Dentition, Missing, Chipped, Dental Advidsory Given   Pulmonary shortness of breath and with exertion, asthma , neg sleep apnea, COPD, neg recent URI, Current Smoker and Patient abstained from smoking.   Pulmonary exam normal breath sounds clear to auscultation       Cardiovascular Exercise Tolerance: Good hypertension, (-) angina (-) Past MI and (-) Cardiac Stents Normal cardiovascular exam(-) dysrhythmias (-) Valvular Problems/Murmurs Rhythm:regular Rate:Normal     Neuro/Psych  PSYCHIATRIC DISORDERS Anxiety Depression Bipolar Disorder   negative neurological ROS     GI/Hepatic ,GERD  ,,(+)     substance abuse  alcohol use, Hepatitis -, C  Endo/Other  negative endocrine ROS    Renal/GU negative Renal ROS  negative genitourinary   Musculoskeletal   Abdominal   Peds  Hematology  (+) Blood dyscrasia, anemia   Anesthesia Other Findings Past Medical History: 01/28/2022: Alcohol withdrawal (HCC) No date: Anxiety No date: Arthritis No date: Asthma No date: Bipolar disorder (HCC) No date: Collagen vascular disease (HCC) No date: COPD (chronic obstructive pulmonary disease) (HCC) 09/05/2020: Empyema (HCC) No date: Hepatitis C No date: Hypertension No date: Substance abuse (HCC)   Reproductive/Obstetrics negative OB ROS                              Anesthesia Physical Anesthesia Plan  ASA: 3  Anesthesia Plan: General   Post-op Pain Management:    Induction:  Intravenous  PONV Risk Score and Plan: 2 and Propofol  infusion and TIVA  Airway Management Planned: Natural Airway and Nasal Cannula  Additional Equipment:   Intra-op Plan:   Post-operative Plan:   Informed Consent: I have reviewed the patients History and Physical, chart, labs and discussed the procedure including the risks, benefits and alternatives for the proposed anesthesia with the patient or authorized representative who has indicated his/her understanding and acceptance.     Dental Advisory Given  Plan Discussed with: Anesthesiologist, CRNA and Surgeon  Anesthesia Plan Comments:         Anesthesia Quick Evaluation

## 2024-01-16 NOTE — Plan of Care (Signed)
  Problem: Education: Goal: Knowledge of General Education information will improve Description: Including pain rating scale, medication(s)/side effects and non-pharmacologic comfort measures Outcome: Progressing   Problem: Health Behavior/Discharge Planning: Goal: Ability to manage health-related needs will improve Outcome: Progressing   Problem: Clinical Measurements: Goal: Ability to maintain clinical measurements within normal limits will improve Outcome: Progressing Goal: Will remain free from infection Outcome: Progressing Goal: Diagnostic test results will improve Outcome: Progressing Goal: Respiratory complications will improve Outcome: Progressing Goal: Cardiovascular complication will be avoided Outcome: Progressing   Problem: Nutrition: Goal: Adequate nutrition will be maintained Outcome: Progressing   Problem: Coping: Goal: Level of anxiety will decrease Outcome: Progressing   Problem: Elimination: Goal: Will not experience complications related to bowel motility Outcome: Progressing Goal: Will not experience complications related to urinary retention Outcome: Progressing   Problem: Safety: Goal: Ability to remain free from injury will improve Outcome: Progressing   

## 2024-01-16 NOTE — Anesthesia Procedure Notes (Signed)
 Procedure Name: General with mask airway Date/Time: 01/16/2024 3:24 PM  Performed by: Ledora Duncan, CRNAPre-anesthesia Checklist: Patient identified, Emergency Drugs available, Suction available and Patient being monitored Patient Re-evaluated:Patient Re-evaluated prior to induction Oxygen Delivery Method: Simple face mask Induction Type: IV induction Placement Confirmation: positive ETCO2 and breath sounds checked- equal and bilateral Dental Injury: Teeth and Oropharynx as per pre-operative assessment

## 2024-01-16 NOTE — Op Note (Signed)
 [facility]astroenterology Patient Name: Nicole [name] Procedure Date: 01/16/2024 3:00 PM MRN: [id number] Account #: [id number] Date of Birth: 12-21-1960 Admit Type: Inpatient Age: 63 Room: [facility] ENDO ROOM 4 Gender: Female Note Status: Finalized Instrument Name: Upper Endoscope [id number] Procedure:             Upper GI endoscopy Indications:           Acute post hemorrhagic anemia Providers:             Rogelia Copping MD, MD Referring MD:          Hadassah SQUIBB. Herold (Referring MD) Medicines:             Propofol  per Anesthesia Complications:         No immediate complications. Procedure:             Pre-Anesthesia Assessment:                        - Prior to the procedure, a History and Physical was                         performed, and patient medications and allergies were                         reviewed. The patient's tolerance of previous                         anesthesia was also reviewed. The risks and benefits                         of the procedure and the sedation options and risks                         were discussed with the patient. All questions were                         answered, and informed consent was obtained. Prior                         Anticoagulants: The patient has taken no anticoagulant                         or antiplatelet agents. ASA Grade Assessment: II - A                         patient with mild systemic disease. After reviewing                         the risks and benefits, the patient was deemed in                         satisfactory condition to undergo the procedure.                        After obtaining informed consent, the endoscope was                         passed under direct vision. Throughout the procedure,  the patient's blood pressure, pulse, and oxygen                         saturations were monitored continuously. The Endoscope                         was introduced through the  mouth, and advanced to the                         second part of duodenum. The upper GI endoscopy was                         accomplished without difficulty. The patient tolerated                         the procedure well. Findings:      The examined esophagus was normal.      The stomach was normal.      Diffuse moderate inflammation characterized by erosions was found in the       duodenal bulb. Impression:            - Normal esophagus.                        - Normal stomach.                        - Duodenitis.                        - No specimens collected. Recommendation:        - Return patient to hospital ward for ongoing care.                        - Resume regular diet.                        - Continue present medications.                        - If any sign of bleeding or further drop in Hb then                         will consider in patient colonoscopy. Otherwise, the                         patient should have an outpatient colonoscopy. Procedure Code(s):     --- Professional ---                        [phone], Esophagogastroduodenoscopy, flexible,                         transoral; diagnostic, including collection of                         specimen(s) by brushing or washing, when performed                         (separate procedure) Diagnosis Code(s):     --- Professional ---  D62, Acute posthemorrhagic anemia                        K29.80, Duodenitis without bleeding CPT copyright 2022 [employer]. All rights reserved. The codes documented in this report are preliminary and upon coder review may  be revised to meet current compliance requirements. Rogelia Copping MD, MD 01/16/2024 3:34:06 PM This report has been signed electronically. Number of Addenda: 0 Note Initiated On: 01/16/2024 3:00 PM Estimated Blood Loss:  Estimated blood loss: none.      [facility]

## 2024-01-16 NOTE — Plan of Care (Signed)
  Problem: Education: Goal: Knowledge of General Education information will improve Description: Including pain rating scale, medication(s)/side effects and non-pharmacologic comfort measures 01/16/2024 1549 by Lynnette Cena CROME, RN Outcome: Progressing 01/16/2024 1546 by Lynnette Cena CROME, RN Outcome: Progressing   Problem: Health Behavior/Discharge Planning: Goal: Ability to manage health-related needs will improve 01/16/2024 1549 by Lynnette Cena CROME, RN Outcome: Progressing 01/16/2024 1546 by Lynnette Cena CROME, RN Outcome: Progressing

## 2024-01-16 NOTE — Plan of Care (Signed)
   Problem: Education: Goal: Knowledge of General Education information will improve Description Including pain rating scale, medication(s)/side effects and non-pharmacologic comfort measures Outcome: Progressing   Problem: Health Behavior/Discharge Planning: Goal: Ability to manage health-related needs will improve Outcome: Progressing

## 2024-01-16 NOTE — Plan of Care (Signed)

## 2024-01-16 NOTE — Consult Note (Signed)
 Nicole Copping, Nicole [name] [facility]  [address], [address] [place], [place] [address] Phone: [phone] Fax : [phone]  Consultation  Referring Provider:     Dr. Josette Primary Care Physician:  Nicole Nicole SQUIBB, Nicole [name] Primary Gastroenterologist: Nicole [name]         Reason for Consultation:     Anemia  Date of Admission:  01/15/2024 Date of Consultation:  01/16/2024         HPI:   Nicole [name] is a 63 y.o. female with extensive alcohol use disorder history with a history of hepatitis [name] hypertension collagen vascular disease anxiety and bipolar disorder.  The patient came in with generalized weakness.  She also reports that she suffers from a bedsore.  The patient has been unable to get out of bed and was brought to the ER.  The patient's hemoglobin was reported to have been 15.39 days ago with it being 11.12 days ago and is down to 8.5 today. The patient now reports that she has been dry for 5 days and does not plan to go back to drinking.  She admits to significant weight loss and decreased p.o. intake.  She denies having been seen by GI in the past.  The patient had a CT scan of the chest that showed emphysema and bilateral solid pulmonary nodules.  It was reported by the patient's husband that the patient may have been having vaginal bleeding but she has had a history of a total hysterectomy.  Past Medical History:  Diagnosis Date   Alcohol withdrawal (HCC) 01/28/2022   Anxiety    Arthritis    Asthma    Bipolar disorder (HCC)    Collagen vascular disease (HCC)    COPD (chronic obstructive pulmonary disease) (HCC)    Empyema (HCC) 09/05/2020   Hepatitis [name]    Hypertension    Substance abuse (HCC)     Past Surgical History:  Procedure Laterality Date   ABDOMINAL HYSTERECTOMY     TONSILLECTOMY     TONSILLECTOMY      Prior to Admission medications   Medication Sig Start Date End Date Taking? Authorizing Provider  albuterol  (PROVENTIL  HFA) 108 (90 Base) MCG/ACT inhaler  Inhale 1-2 puffs into the lungs once every 6 (six) hours as needed for wheezing or shortness of breath. 07/30/22  Yes Nicole [name], Nicole [name], Nicole [name]  Buprenorphine  HCl-Naloxone  HCl 8-2 MG FILM Place under the tongue 2 (two) times daily.   Yes Provider, Historical, Nicole [name]  doxycycline  (VIBRA -TABS) 100 MG tablet Take 1 tablet (100 mg total) by mouth 2 (two) times daily for 10 days. 01/07/24 01/17/24 Yes Nicole Nicole SQUIBB, Nicole [name]  guaiFENesin  (ROBITUSSIN) 100 MG/5ML liquid Take 5 mLs (100 mg total) by mouth every 4 (four) hours as needed for cough or to loosen phlegm. Take during the day 12/12/21  Yes Nicole [name], Nicole [name], Nicole [name]  hydrOXYzine  (ATARAX ) 25 MG tablet SMARTSIG:1 Tablet(s) By Mouth Every 12 Hours PRN 12/20/23  Yes Provider, Historical, Nicole [name]  mirtazapine  (REMERON ) 30 MG tablet Take 30 mg by mouth at bedtime. 12/20/23  Yes Provider, Historical, Nicole [name]  naltrexone  (DEPADE) 50 MG tablet Take 1 tablet (50 mg total) by mouth daily. 01/07/24  Yes Nicole Nicole SQUIBB, Nicole [name]  naproxen  (NAPROSYN ) 250 MG tablet Take 250 mg by mouth 2 (two) times daily as needed for mild pain or moderate pain.   Yes Provider, Historical, Nicole [name]  olmesartan  (BENICAR ) 20 MG tablet Take 1 tablet (20 mg total) by mouth daily. 01/07/24  Yes Nicole,  Nicole SQUIBB, Nicole [name]  omeprazole (PRILOSEC) 40 MG capsule Take by mouth 2 (two) times daily. 11/19/23  Yes Provider, Historical, Nicole [name]  QUEtiapine  (SEROQUEL ) 200 MG tablet Take 200 mg by mouth daily. 11/20/23  Yes Provider, Historical, Nicole [name]  Blood Pressure KIT USE AS DIRECTED TWICE DAILY. 10/03/21   Nicole [name], Nicole [name], Nicole [name]  hydrALAZINE  (APRESOLINE ) 50 MG tablet Take 1 tablet (50 mg total) by mouth 3 (three) times daily. APPOINTMENT OVERDUE Patient not taking: Reported on 01/15/2024 12/26/22   Nicole [name], Nicole BROCKS, Nicole [name]    Family History  Problem Relation Age of Onset   Depression Mother    Hypertension Mother    Heart attack Mother    Insomnia Mother    Thyroid disease Mother    Bipolar disorder Mother    Other Father         Parkinson's disease   Parkinson's disease Father    Arthritis Maternal Grandmother    Bipolar disorder Maternal Grandmother    Depression Maternal Grandmother    Other Maternal Grandfather        unknown medical history   Heart attack Paternal Grandmother    Ovarian cancer Paternal Grandmother    Hyperlipidemia Paternal Grandfather    Hypertension Paternal Grandfather    Congestive Heart Failure Paternal Grandfather    Rheum arthritis Other    Osteoporosis Other    Clotting disorder Other    Stroke Other      Social History   Tobacco Use   Smoking status: Every Day    Current packs/day: 2.00    Average packs/day: 2.0 packs/day for 33.0 years (66.0 ttl pk-yrs)    Types: Cigarettes   Smokeless tobacco: Never   Tobacco comments:    1PPD 02/19/2022, currently using nicotine  patches, patient also occasionally uses daughter's vape    Currently drinking 1 wine cooler 2 times per week, no liquor since 01/30/22  Vaping Use   Vaping status: Some Days   Substances: Nicotine , Flavoring  Substance Use Topics   Alcohol use: Yes    Alcohol/week: 4.0 standard drinks of alcohol    Types: 4 Standard drinks or equivalent per week    Comment: 1 pint a day of brown liquor with pepsi   Drug use: Yes    Types: Cocaine, Marijuana    Comment: uses marijuana occassionally, last Cocaine ~ 2017    Allergies as of 01/14/2024   (No Known Allergies)    Review of Systems:    All systems reviewed and negative except where noted in HPI.   Physical Exam:  Vital signs in last 24 hours: Temp:  [97.8 F (36.6 [name])-99.3 F (37.4 [name])] 98.5 F (36.9 [name]) (07/17 0852) Pulse Rate:  [90-95] 90 (07/17 0852) Resp:  [14-18] 18 (07/17 0852) BP: (126-148)/(75-84) 148/83 (07/17 0852) SpO2:  [98 %-100 %] 98 % (07/17 0852) Last BM Date : 01/15/24 General:   Pleasant, cooperative in NAD Head:  Normocephalic and atraumatic. Eyes:   No icterus.   Conjunctiva pink. PERRLA. Ears:  Normal auditory acuity. Neck:   Supple; no masses or thyroidomegaly Lungs: Respirations even and unlabored. Lungs clear to auscultation bilaterally.   No wheezes, crackles, or rhonchi.  Heart:  Regular rate and rhythm;  Without murmur, clicks, rubs or gallops Abdomen:  Soft, nondistended, nontender. Normal bowel sounds. No appreciable masses or hepatomegaly.  No rebound or guarding.  Rectal:  Not performed. Msk:  Symmetrical without gross deformities.    Extremities:  Without edema, cyanosis or clubbing. Neurologic:  Alert and oriented  x3;  grossly normal neurologically. Skin:  Intact without significant lesions or rashes. Cervical Nodes:  No significant cervical adenopathy. Psych:  Alert and cooperative. Normal affect.  LAB RESULTS: Recent Labs    01/14/24 2255 01/15/24 0429 01/16/24 0525  WBC 14.8* 15.6* 9.0  HGB 11.1* 9.5* 8.5*  HCT 30.1* 26.1* 23.9*  PLT 334 245 243   BMET Recent Labs    01/14/24 2255 01/15/24 0429 01/16/24 0525  NA 124* 127* 130*  K 3.7 2.9* 3.6  CL 87* 96* 104  CO2 24 24 20*  GLUCOSE 155* 151* 125*  BUN 31* 31* 27*  CREATININE 1.18* 1.17* 1.09*  CALCIUM 9.1 8.3* 8.4*   LFT Recent Labs    01/14/24 2255  PROT 6.3*  ALBUMIN 2.6*  AST 70*  ALT 34  ALKPHOS 82  BILITOT 1.2   PT/INR No results for input(s): LABPROT, INR in the last 72 hours.  STUDIES: No results found.    Impression / Plan:   Assessment: Active Problems:   Alcohol abuse   Hyponatremia   Generalized weakness   Intractable nausea and vomiting   Essential hypertension   GERD without esophagitis   Depression   Drop in hemoglobin   Chronic pain   COPD (chronic obstructive pulmonary disease) (HCC)   Irritant dermatitis   Nicole [name] is a 63 y.o. y/o female with significant anemia who is now n.p.o. and has had significant weight loss with a history of alcohol abuse.  The patient was also noted to have elevated liver enzymes with a AST higher than ALT.  The hemoglobin 2 days ago was 11.5  and 9 days ago was 15.3 which was down to 8.5 today and a repeat this afternoon was 9.1.  Plan:  The patient will be set up for an EGD for today.  The patient has been explained the plan and agrees with it.  This will try to rule out any source of anemia including an upper GI bleed from possible esophagitis, varices or peptic ulcer disease.  She has been told about the risks benefits and alternatives.  She will proceed with the upper endoscopy today.  Thank you for involving me in the care of this patient.      LOS: 1 day   Nicole Copping, MD, Nicole [name]. NOLIA 01/16/2024, 2:31 PM,  Pager [phone] 7am-5pm  Check AMION for 5pm -7am coverage and on weekends   Note: This dictation was prepared with Dragon dictation along with smaller phrase technology. Any transcriptional errors that result from this process are unintentional.

## 2024-01-17 ENCOUNTER — Encounter: Payer: Self-pay | Admitting: Gastroenterology

## 2024-01-17 DIAGNOSIS — K2980 Duodenitis without bleeding: Secondary | ICD-10-CM

## 2024-01-17 DIAGNOSIS — D649 Anemia, unspecified: Secondary | ICD-10-CM | POA: Diagnosis not present

## 2024-01-17 DIAGNOSIS — R531 Weakness: Secondary | ICD-10-CM | POA: Diagnosis not present

## 2024-01-17 DIAGNOSIS — R634 Abnormal weight loss: Secondary | ICD-10-CM | POA: Diagnosis not present

## 2024-01-17 DIAGNOSIS — E871 Hypo-osmolality and hyponatremia: Secondary | ICD-10-CM | POA: Diagnosis not present

## 2024-01-17 DIAGNOSIS — L249 Irritant contact dermatitis, unspecified cause: Secondary | ICD-10-CM | POA: Diagnosis not present

## 2024-01-17 DIAGNOSIS — J439 Emphysema, unspecified: Secondary | ICD-10-CM | POA: Diagnosis not present

## 2024-01-17 DIAGNOSIS — R710 Precipitous drop in hematocrit: Secondary | ICD-10-CM | POA: Diagnosis not present

## 2024-01-17 DIAGNOSIS — N179 Acute kidney failure, unspecified: Secondary | ICD-10-CM | POA: Diagnosis not present

## 2024-01-17 DIAGNOSIS — K219 Gastro-esophageal reflux disease without esophagitis: Secondary | ICD-10-CM | POA: Diagnosis not present

## 2024-01-17 DIAGNOSIS — F1011 Alcohol abuse, in remission: Secondary | ICD-10-CM | POA: Diagnosis not present

## 2024-01-17 DIAGNOSIS — R112 Nausea with vomiting, unspecified: Secondary | ICD-10-CM | POA: Diagnosis not present

## 2024-01-17 DIAGNOSIS — R7401 Elevation of levels of liver transaminase levels: Secondary | ICD-10-CM | POA: Diagnosis not present

## 2024-01-17 DIAGNOSIS — I10 Essential (primary) hypertension: Secondary | ICD-10-CM | POA: Diagnosis not present

## 2024-01-17 LAB — CBC
HCT: 24.4 % — ABNORMAL LOW (ref 36.0–46.0)
Hemoglobin: 8.8 g/dL — ABNORMAL LOW (ref 12.0–15.0)
MCH: 35.9 pg — ABNORMAL HIGH (ref 26.0–34.0)
MCHC: 36.1 g/dL — ABNORMAL HIGH (ref 30.0–36.0)
MCV: 99.6 fL (ref 80.0–100.0)
Platelets: 282 K/uL (ref 150–400)
RBC: 2.45 MIL/uL — ABNORMAL LOW (ref 3.87–5.11)
RDW: 13.7 % (ref 11.5–15.5)
WBC: 7.5 K/uL (ref 4.0–10.5)
nRBC: 0 % (ref 0.0–0.2)

## 2024-01-17 LAB — PROTIME-INR
INR: 1.1 (ref 0.8–1.2)
Prothrombin Time: 14.5 s (ref 11.4–15.2)

## 2024-01-17 LAB — BASIC METABOLIC PANEL WITH GFR
Anion gap: 3 — ABNORMAL LOW (ref 5–15)
BUN: 18 mg/dL (ref 8–23)
CO2: 22 mmol/L (ref 22–32)
Calcium: 8.8 mg/dL — ABNORMAL LOW (ref 8.9–10.3)
Chloride: 106 mmol/L (ref 98–111)
Creatinine, Ser: 0.77 mg/dL (ref 0.44–1.00)
GFR, Estimated: >60 mL/min (ref >60)
Glucose, Bld: 115 mg/dL — ABNORMAL HIGH (ref 70–99)
Potassium: 3.9 mmol/L (ref 3.5–5.1)
Sodium: 131 mmol/L — ABNORMAL LOW (ref 135–145)

## 2024-01-17 MED ORDER — OXYCODONE HCL 5 MG PO TABS
5.0000 mg | ORAL_TABLET | Freq: Four times a day (QID) | ORAL | Status: DC | PRN
  Administered 2024-01-17 – 2024-01-19 (×4): 5 mg via ORAL
  Filled 2024-01-17 (×5): qty 1

## 2024-01-17 MED ORDER — BUPRENORPHINE HCL 2 MG SL SUBL
2.0000 mg | SUBLINGUAL_TABLET | Freq: Two times a day (BID) | SUBLINGUAL | Status: DC
  Administered 2024-01-17 – 2024-01-19 (×6): 2 mg via SUBLINGUAL
  Filled 2024-01-17 (×7): qty 1

## 2024-01-17 NOTE — Plan of Care (Signed)

## 2024-01-17 NOTE — NC FL2 (Signed)
 [place]  MEDICAID FL2 LEVEL OF CARE FORM     IDENTIFICATION  Patient Name: Nicole [name] Birthdate: 1961-02-15 Sex: female Admission Date (Current Location): 01/15/2024  [place] and [place] Number:  [place] [id number] Facility and Address:  [facility], [address], [place], [place] [address]      Provider Number: [id number]  Attending Physician Name and Address:  Josette Ade, MD  Relative Name and Phone Number:  Earnestene Angello, husband, phone: [phone]    Current Level of Care: Hospital Recommended Level of Care: Skilled Nursing Facility Prior Approval Number:    Date Approved/Denied:   PASRR Number:    Discharge Plan: SNF    Current Diagnoses: Patient Active Problem List   Diagnosis Date Noted   Symptomatic anemia 01/16/2024   Acute posthemorrhagic anemia 01/16/2024   Duodenitis 01/16/2024   Hyponatremia 01/15/2024   Generalized weakness 01/15/2024   Intractable nausea and vomiting 01/15/2024   Essential hypertension 01/15/2024   GERD without esophagitis 01/15/2024   Depression 01/15/2024   Drop in hemoglobin 01/15/2024   Chronic pain 01/15/2024   COPD (chronic obstructive pulmonary disease) (HCC) 01/15/2024   Irritant dermatitis 01/15/2024   Hearing loss 11/08/2021   Tachycardia 11/08/2021   Hip pain, chronic, right 10/10/2021   History of bipolar disorder 10/03/2021   History of COPD 10/03/2021   Ear stuffiness, left 10/03/2021   Ankle edema, bilateral 10/03/2021   Protein-calorie malnutrition, severe 09/02/2020   Collagen vascular disease (HCC)    Nicotine  dependence    Hypertension 12/10/2018   Alcohol abuse 10/22/2016   Korsakoff's psychosis, 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   Cocaine abuse (HCC) 10/22/2016   Asthma 10/06/2015    Orientation RESPIRATION BLADDER Height & Weight     Self, Time, Situation, Place  Normal Incontinent Weight: 46.9 kg Height:  5' 2 (157.5 cm)  BEHAVIORAL SYMPTOMS/MOOD  NEUROLOGICAL BOWEL NUTRITION STATUS      Incontinent  (Please see discharge summary)  AMBULATORY STATUS COMMUNICATION OF NEEDS Skin    (Not attempted) Verbally  (Ecchymosis/bruising left arm, buttocks, non-tenting, dry, flaky)                       Personal Care Assistance Level of Assistance  Bathing, Feeding, Dressing Bathing Assistance: Limited assistance Feeding assistance: Limited assistance Dressing Assistance: Limited assistance     Functional Limitations Info  Hearing, Sight Sight Info: Impaired Hearing Info: Impaired      SPECIAL CARE FACTORS FREQUENCY                       Contractures      Additional Factors Info  Code Status, Allergies Code Status Info: Full Code Allergies Info: No Known Allergies           Current Medications (01/17/2024):  This is the current hospital active medication list Current Facility-Administered Medications  Medication Dose Route Frequency Provider Last Rate Last Admin   0.9 %  sodium chloride  infusion   Intravenous Continuous Jinny Carmine, MD   Stopped at 01/17/24 0000   acetaminophen  (TYLENOL ) tablet 650 mg  650 mg Oral Q6H PRN Jinny Carmine, MD   650 mg at 01/17/24 1012   Or   acetaminophen  (TYLENOL ) suppository 650 mg  650 mg Rectal Q6H PRN Jinny Carmine, MD       budesonide  (PULMICORT ) nebulizer solution 0.25 mg  0.25 mg Nebulization BID Jinny Carmine, MD   0.25 mg at 01/16/24 0749   buprenorphine  (SUBUTEX ) SL tablet 2  mg  2 mg Sublingual BID Josette Ade, MD   2 mg at 01/17/24 1147   Chlorhexidine  Gluconate Cloth 2 % PADS 6 each  6 each Topical Daily Jinny Carmine, MD   6 each at 01/17/24 1023   feeding supplement (ENSURE PLUS HIGH PROTEIN) liquid 237 mL  237 mL Oral BID BM Jinny Carmine, MD   237 mL at 01/17/24 1030   Gerhardt's butt cream   Topical TID Jinny Carmine, MD   Given at 01/17/24 1023   guaiFENesin  (ROBITUSSIN) 100 MG/5ML liquid 100 mg  100 mg Oral Q4H PRN Jinny Carmine, MD       hydrALAZINE  (APRESOLINE )  tablet 50 mg  50 mg Oral TID Wohl, Darren, MD   50 mg at 01/17/24 1018   hydrOXYzine  (ATARAX ) tablet 25 mg  25 mg Oral TID PRN Jinny Carmine, MD   25 mg at 01/15/24 2155   ipratropium-albuterol  (DUONEB) 0.5-2.5 (3) MG/3ML nebulizer solution 3 mL  3 mL Nebulization Q4H PRN Jinny Carmine, MD       irbesartan  (AVAPRO ) tablet 37.5 mg  37.5 mg Oral Daily Jinny Carmine, MD   37.5 mg at 01/17/24 1010   magnesium  hydroxide (MILK OF MAGNESIA) suspension 30 mL  30 mL Oral Daily PRN Jinny Carmine, MD       mirtazapine  (REMERON ) tablet 30 mg  30 mg Oral QHS Wohl, Darren, MD   30 mg at 01/16/24 2204   morphine  (PF) 2 MG/ML injection 2 mg  2 mg Intravenous Q4H PRN Jinny Carmine, MD   2 mg at 01/17/24 1015   ondansetron  (ZOFRAN ) tablet 4 mg  4 mg Oral Q6H PRN Jinny Carmine, MD       Or   ondansetron  (ZOFRAN ) injection 4 mg  4 mg Intravenous Q6H PRN Jinny Carmine, MD       oxyCODONE  (Oxy IR/ROXICODONE ) immediate release tablet 5 mg  5 mg Oral Q6H PRN Josette Ade, MD   5 mg at 01/17/24 1147   pantoprazole  (PROTONIX ) injection 40 mg  40 mg Intravenous Q12H Jinny Carmine, MD   40 mg at 01/17/24 1010   QUEtiapine  (SEROQUEL ) tablet 100 mg  100 mg Oral QHS Jinny Carmine, MD   100 mg at 01/16/24 2204   traZODone  (DESYREL ) tablet 25 mg  25 mg Oral QHS PRN Jinny Carmine, MD   25 mg at 01/16/24 2204     Discharge Medications: Please see discharge summary for a list of discharge medications.  Relevant Imaging Results:  Relevant Lab Results:   Additional Information SSN: [id number]  Elouise LULLA Capri, RN

## 2024-01-17 NOTE — Progress Notes (Signed)
 Rogelia Copping, MD [facility]   [address] [place], [place] [address] Phone: [phone] Fax : [phone]   Subjective: This patient's hemoglobin is stable today without any signs of any GI bleeding.  She denies any black stools or bloody stools.  She also denies any abdominal pain.  The patient had an upper endoscopy done yesterday.  The upper endoscopy showed duodenitis.  There is no active sign of bleeding   Objective: Vital signs in last 24 hours: Vitals:   01/16/24 1602 01/16/24 1657 01/16/24 2100 01/17/24 0428  BP:  (!) 132/91 130/86 130/74  Pulse:  97 94 90  Resp: 20 18 20 20   Temp:  97.8 F (36.6 C) 97.8 F (36.6 C) 97.8 F (36.6 C)  TempSrc:  Oral Oral Oral  SpO2:  100% 100% 100%  Weight:      Height:       Weight change:   Intake/Output Summary (Last 24 hours) at 01/17/2024 9095 Last data filed at 01/17/2024 0000 Gross per 24 hour  Intake 1360 ml  Output --  Net 1360 ml     Exam: Heart:: Regular rate and rhythm or without murmur or extra heart sounds Lungs: normal and clear to auscultation and percussion Abdomen: soft, nontender, normal bowel sounds   Lab Results: @LABTEST2 @ Micro Results: Recent Results (from the past 240 hours)  MRSA Next Gen by PCR, Nasal     Status: None   Collection Time: 01/15/24  2:37 AM   Specimen: Nasal Mucosa; Nasal Swab  Result Value Ref Range Status   MRSA by PCR Next Gen NOT DETECTED NOT DETECTED Final    Comment: (NOTE) The GeneXpert MRSA Assay (FDA approved for NASAL specimens only), is one component of a comprehensive MRSA colonization surveillance program. It is not intended to diagnose MRSA infection nor to guide or monitor treatment for MRSA infections. Test performance is not FDA approved in patients less than 82 years old. Performed at [facility], [address] [place], [place] [address]    Studies/Results: No results found. Medications: I have reviewed the patient's current  medications. Scheduled Meds:  budesonide  (PULMICORT ) nebulizer solution  0.25 mg Nebulization BID   buprenorphine -naloxone   1 tablet Sublingual BID   Chlorhexidine  Gluconate Cloth  6 each Topical Daily   feeding supplement  237 mL Oral BID BM   Gerhardt's butt cream   Topical TID   hydrALAZINE   50 mg Oral TID   irbesartan   37.5 mg Oral Daily   mirtazapine   30 mg Oral QHS   pantoprazole  (PROTONIX ) IV  40 mg Intravenous Q12H   QUEtiapine   100 mg Oral QHS   Continuous Infusions:  sodium chloride  Stopped (01/17/24 0000)   PRN Meds:.acetaminophen  **OR** acetaminophen , guaiFENesin , hydrOXYzine , ipratropium-albuterol , magnesium  hydroxide, morphine  injection, ondansetron  **OR** ondansetron  (ZOFRAN ) IV, traZODone    Assessment: Active Problems:   Alcohol abuse   Hyponatremia   Generalized weakness   Intractable nausea and vomiting   Essential hypertension   GERD without esophagitis   Depression   Drop in hemoglobin   Chronic pain   COPD (chronic obstructive pulmonary disease) (HCC)   Irritant dermatitis   Symptomatic anemia   Acute posthemorrhagic anemia   Duodenitis    Plan: This patient was seen for anemia with no sign of active bleeding on the EGD and no report of black stools or bloody stools.  The patient has not had a colonoscopy in the recent past and should have a colonoscopy as an outpatient.  With  the patient's hemoglobin being stable and with her sacral wounds I do not recommend doing a prep and a colonoscopy at this time.  The patient should follow-up as an outpatient for colonoscopy.  I will sign off.  Please call if any further GI concerns or questions.  We would like to thank you for the opportunity to participate in the care of [facility].    LOS: 2 days   Rogelia Copping, MD.FACG 01/17/2024, 9:04 AM Pager [phone] 7am-5pm  Check AMION for 5pm -7am coverage and on weekends

## 2024-01-17 NOTE — Progress Notes (Signed)
 PT Cancellation Note  Patient Details Name: Nicole [name] MRN: [id number] DOB: [birth year]   Cancelled Treatment:    Reason Eval/Treat Not Completed: Other (comment). Upon entry pt declining PT stating she has already had her therapy (I.e. occupational therapy). Stating she has agreed to Str at d/c. Discussed with pt on need to participate with all disciplines in order for her insurance and STR to approve her stay. Pt does not seem to understand need for participation for approval despite education. Requesting re-attempt another day. Will re- attempt per POC.   Dorina HERO. Fairly IV, PT, DPT Physical Therapist- [place]  [facility] 01/17/2024, 12:15 PM

## 2024-01-17 NOTE — Progress Notes (Signed)
 Occupational Therapy Treatment Patient Details Name: Nicole [name] MRN: [id number] DOB: 03-23-1961 Today's Date: 01/17/2024   History of present illness Pt is a 63 y/o F admitted on 01/15/24 after presenting with c/o acute onset of generalized weakness that has been worsening over the last couple of weeks, associated with nausea & decreased appetite; pt also endorses significant alcohol intake. Pt is being treated for hyponatremia. PMH: anxiety, asthma, bipolar disorder, Hep C, COPD, HTN, collagen vascular disease, empyema   OT comments  Pt is in L sidelying on arrival. Anxious and declining therapy initially, but with encouragement agreeable to assist with bed mobility and changing out linens/chux pads. Pt is able to roll in bed using bed rails with supervision and bridge with cueing to prevent shearing of pads on buttocks. Buttocks is actively bleeding and raw, nurse came in to provide meds. Pt needs Mod A to scoot to [facility], but declines all further mobility d/t pain. Edu on importance of pressure relief techniques and progression of mobility to prevent worsening status. Pt left with all needs in place and will cont to require skilled acute OT services to maximize her safety and IND to return to PLOF.       If plan is discharge home, recommend the following:  A lot of help with walking and/or transfers;A lot of help with bathing/dressing/bathroom;Help with stairs or ramp for entrance;Assistance with cooking/housework;Direct supervision/assist for medications management;Direct supervision/assist for financial management   Equipment Recommendations  Other (comment) (defer)    Recommendations for Other Services      Precautions / Restrictions Precautions Precautions: Fall Precaution/Restrictions Comments: reduce shearing forces on buttocks Restrictions Weight Bearing Restrictions Per Provider Order: No       Mobility Bed Mobility Overal bed mobility: Needs Assistance Bed Mobility:  Rolling Rolling: Supervision, Used rails         General bed mobility comments: able to roll from L side to R side in bed using bed rails with supervision, able to brdige in bed to remove chux pads and replace with cleans one and pillow for offloading; refused sitting at EOB this date; Mod A to scoot to [facility]    Transfers                   General transfer comment: declined     Balance                                           ADL either performed or assessed with clinical judgement   ADL Overall ADL's : Needs assistance/impaired                                       General ADL Comments: pt declining all ADLs, anticipate max A at this time    Extremity/Trunk Assessment              Vision       Perception     Praxis     Communication Communication Communication: No apparent difficulties   Cognition Arousal: Alert Behavior During Therapy: Anxious                                   Following commands impaired: Follows one step commands with  increased time      Cueing   Cueing Techniques: Verbal cues, Gestural cues, Tactile cues, Visual cues  Exercises Other Exercises Other Exercises: Re-educated on importance of offloading and getting OOB to maximize strength and prevent further decline and breakdown on buttocks.    Shoulder Instructions       General Comments significant redness and breakdown in peri-care and buttocks raw and bleeding; LUE very bruised; pt anxious and moaning throughout session    Pertinent Vitals/ Pain       Pain Assessment Pain Assessment: 0-10 Pain Score: 10-Worst pain ever Pain Location: peri area and buttocks-buttocks actively bleeding Pain Descriptors / Indicators: Discomfort, Crying, Moaning, Grimacing, Guarding Pain Intervention(s): Limited activity within patient's tolerance, Monitored during session, Repositioned, RN gave pain meds during session  Home Living                                           Prior Functioning/Environment              Frequency  Min 2X/week        Progress Toward Goals  OT Goals(current goals can now be found in the care plan section)  Progress towards OT goals: Progressing toward goals  Acute Rehab OT Goals Patient Stated Goal: decrease pain OT Goal Formulation: With patient Time For Goal Achievement: 01/29/24 Potential to Achieve Goals: Good  Plan      Co-evaluation                 AM-PAC OT 6 Clicks Daily Activity     Outcome Measure   Help from another person eating meals?: None Help from another person taking care of personal grooming?: None Help from another person toileting, which includes using toliet, bedpan, or urinal?: A Lot Help from another person bathing (including washing, rinsing, drying)?: A Lot Help from another person to put on and taking off regular upper body clothing?: A Little Help from another person to put on and taking off regular lower body clothing?: A Lot 6 Click Score: 17    End of Session    OT Visit Diagnosis: Other abnormalities of gait and mobility (R26.89);Muscle weakness (generalized) (M62.81)   Activity Tolerance Patient limited by pain   Patient Left in bed;with bed alarm set;with nursing/sitter in room   Nurse Communication Mobility status        Time: 9048-8983 OT Time Calculation (min): 25 min  Charges: OT General Charges $OT Visit: 1 Visit OT Treatments $Therapeutic Activity: 23-37 mins  Caylea Foronda, OTR/L  01/17/24, 12:49 PM   Madilynn Montante E Sonji Starkes 01/17/2024, 12:46 PM

## 2024-01-17 NOTE — Assessment & Plan Note (Addendum)
 Creatinine as high as 1.18 and last creatinine down to 0.77.  Today's creatinine up at 1.11.  Will give a fluid bolus.

## 2024-01-17 NOTE — TOC Initial Note (Addendum)
 Transition of Care [facility]) - Initial/Assessment Note    Patient Details  Name: Nicole [name] MRN: [id number] Date of Birth: May 19, 1961  [facility]) CM/SW Contact:    Nicole LULLA Capri, RN 01/17/2024, 11:22 AM  Clinical Narrative:                  CM to patient's room regarding case management screening assessment. CM introduced case management role and discharge care planning process. Patient amenable to case management interview. Patient lives with husband and 2 sons, and 3 dogs--vaccinated. CM and patient discussed SNF recommendations. Patient declined SNF. Per patient states will discuss SNF recommendations with husband.   Patient husband, Nicole [name], arrived to visit patient. CM and patient and patient's husband discussed SNF recommendations. Patient states will accept SNF. CM will perform SNF workup.  Expected Discharge Plan: Skilled Nursing Facility Barriers to Discharge: Continued Medical Work up   Patient Goals and CMS Choice    SNF   Expected Discharge Plan and Services    SNF   Living arrangements for the past 2 months: Single Family Home    Prior Living Arrangements/Services Living arrangements for the past 2 months: Single Family Home Lives with:: Adult Children, Spouse, Pets (3 dogs---vaccinated) Patient language and need for interpreter reviewed:: No          Care giver support system in place?: Yes (comment)   Criminal Activity/Legal Involvement Pertinent to Current Situation/Hospitalization: No - Comment as needed  Activities of Daily Living   ADL Screening (condition at time of admission) Independently performs ADLs?: No Does the patient have a NEW difficulty with bathing/dressing/toileting/self-feeding that is expected to last >3 days?: Yes (Initiates electronic notice to provider for possible OT consult) Does the patient have a NEW difficulty with getting in/out of bed, walking, or climbing stairs that is expected to last >3 days?: Yes  (Initiates electronic notice to provider for possible PT consult) Does the patient have a NEW difficulty with communication that is expected to last >3 days?: No Is the patient deaf or have difficulty hearing?: Yes Does the patient have difficulty seeing, even when wearing glasses/contacts?: No Does the patient have difficulty concentrating, remembering, or making decisions?: No  Permission Sought/Granted Permission sought to share information with : Case Manager, Family Supports Permission granted to share information with : Yes, Verbal Permission Granted  Share Information with NAME: Nicole [name]/Nicole [name]     Permission granted to share info w Relationship: Husband/Friend     Emotional Assessment Appearance:: Appears older than stated age Attitude/Demeanor/Rapport: Engaged Affect (typically observed): Calm Orientation: : Oriented to Self, Oriented to  Time, Oriented to Situation, Oriented to Place Alcohol / Substance Use: Alcohol Use, Tobacco Use (Alcohol use is 1 week ago; Cigarette 2 days ago)    Admission diagnosis:  Hyponatremia [E87.1] Patient Active Problem List   Diagnosis Date Noted   Symptomatic anemia 01/16/2024   Acute posthemorrhagic anemia 01/16/2024   Duodenitis 01/16/2024   Hyponatremia 01/15/2024   Generalized weakness 01/15/2024   Intractable nausea and vomiting 01/15/2024   Essential hypertension 01/15/2024   GERD without esophagitis 01/15/2024   Depression 01/15/2024   Drop in hemoglobin 01/15/2024   Chronic pain 01/15/2024   COPD (chronic obstructive pulmonary disease) (HCC) 01/15/2024   Irritant dermatitis 01/15/2024   Hearing loss 11/08/2021   Tachycardia 11/08/2021   Hip pain, chronic, right 10/10/2021   History of bipolar disorder 10/03/2021   History of COPD 10/03/2021   Ear stuffiness, left 10/03/2021   Ankle  edema, bilateral 10/03/2021   Protein-calorie malnutrition, severe 09/02/2020   Collagen vascular disease (HCC)    Nicotine   dependence    Hypertension 12/10/2018   Alcohol abuse 10/22/2016   Korsakoff's psychosis, 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 [place] [address] Phone: [phone] Fax: [phone]  [facility] [address] [place] [address] Phone: [phone] Fax: [phone]  [id number] [place], [place] - [place] [address] AT [facility] OF [address] [address] [place] [address] Phone: [phone] Fax: [phone]     Social Drivers of Health (SDOH) Social History: SDOH Screenings   Food Insecurity: No Food Insecurity (01/15/2024)  Housing: Low Risk  (01/15/2024)  Transportation Needs: No Transportation Needs (01/15/2024)  Utilities: Not At Risk (01/15/2024)  Alcohol Screen: Low Risk  (10/03/2021)  Depression (PHQ2-9): High Risk (04/17/2022)  Tobacco Use: High Risk (01/16/2024)   SDOH Interventions:     Readmission Risk Interventions     No data to display

## 2024-01-17 NOTE — Consult Note (Signed)
 WOC team consulted for moisture associated damage to buttocks/vagina/inner thighs. See previous consult note 01/15/2024.  MASD is severe and patient continues to have loose stools per bedside nurse.  Will continue with Vashe and Gerhardt's 3 times a day as previously ordered.    Thank you,    Powell Bar MSN, RN-BC, [employer]

## 2024-01-17 NOTE — Progress Notes (Signed)
 Progress Note   Patient: Nicole [name] FMW:[id number] DOB: 1961/01/15 DOA: 01/15/2024     2 DOS: the patient was seen and examined on 01/17/2024   Brief hospital course: 63 y.o. female with medical history significant for anxiety, asthma, Bipolar disorder, COPD, Hepatitis C, hypertension and collagen vascular disease, who presented to the emergency room with acute onset of generalized weakness which has been worsening over the last couple of weeks with associated nausea and diminished appetite.  She has become so weak she cannot get up or out of bed unless her husband left her.  She admits to significant alcohol intake but stated that she has not been drinking over the last 4 days due to anorexia.  She only ate a pack of crackers today.  5 days ago she had a fall with subsequent left arm bruising.  No nausea or vomiting or diarrhea.  She also has gluteal ulceration which has been bleeding.  No fever or chills.  No chest pain or palpitations.  No cough or wheezing or dyspnea.  No chest pain or palpitations.  No dysuria, oliguria or hematuria or flank pain.  She has been having recurrent falls without injuries.  She drinks about a wine cooler daily.   ED Course: When she came to the ER, BP was 130/75 with heart rate 119 with otherwise normal vital signs.  Labs revealed hyponatremia 124 and hypochloremia of 87 with a blood glucose of 155, BUN of 31 and creatinine 1.18 with albumin 2.6 and total protein 6.3.  AST was 70 and ALT 34.  CBC showed leukocytosis of 14.8 and anemia lower than previous levels with hemoglobin 11.1 hematocrit 30.1 compared to 15.3 and 43.4 on 01/07/2024.  Blood group was O+ with positive antibody screen.  TSH was 1.85. EKG as reviewed by me : None Imaging: Portable chest x-ray showed the following: 1. No radiographic evidence of acute cardiopulmonary disease. 2. Chronic findings in the lungs concerning for potential interstitial lung disease. Outpatient referral to Pulmonology  for further clinical evaluation is recommended. Follow-up nonemergent high-resolution chest CT should be considered if clinically appropriate. 3. Aortic atherosclerosis.   The patient was given 1 L bolus of IV normal saline.  She will be admitted to a medical telemetry bed for further evaluation and management.  7/16.  Patient asking about her Suboxone  and complaining of her buttock being painful.  Patient stopped eating for few days.  With her drop in hemoglobin I asked her about any bleeding.  Patient stated she had some bleeding about a week ago.  Husband stated it was less than that and he thinks it was vaginal bleeding.  Patient had a total abdominal hysterectomy previously. 7/17 EGD showing duodenitis and no active bleeding.  Morning's hemoglobin was 8.5 and the afternoon was 9.1 7/18.  Patient complaining of a lot of buttock pain.  Discontinued the naltrexone  part of her Suboxone  to get better pain control.  Assessment and Plan: Drop in hemoglobin Suspected upper GI bleed.  EGD showing duodenitis hemoglobin 11.1 on presentation now 8.8.  (Last admission range between 13 and 15) continue to monitor closely.  Continue duodenitis.  GI recommends an outpatient colonoscopy.  Will check vitamin B12.  Patient also has lots of bruising and may have lost blood that way also.  Hyponatremia Sodium 131.  Generalized weakness Physical therapy recommending rehab  Intractable nausea and vomiting Improved.  Patient tolerating diet..  Protonix  IV twice daily  AKI (acute kidney injury) (HCC) Creatinine as high as 1.18  and last creatinine down to 0.77.  Irritant dermatitis Buttock.  Cleanse buttocks/sacrum and posterior thighs and inner thighs with Vashe wound cleanser Soila [phone]) do not rinse and allowed to air dry.  Apply Gerhard's Butt cream 3 times a day  COPD (chronic obstructive pulmonary disease) (HCC) With initial wheezing, continue nebulizer treatments.  Hold off on  steroids.  Chronic pain Acute on chronic pain.  Switch Suboxone  over to buprenorphine .  Patient requiring extra pain medications for buttock pain.  Depression Continue Remeron  and Seroquel .  GERD without esophagitis Switched PPI to IV twice daily.  Essential hypertension Patient on hydralazine  and irbesartan .  Alcohol abuse Patient stated she quit drinking prior to admission.        Subjective: Patient had a lot of pain with her buttock.  Changing positions.  EGD showing duodenitis and no active bleeding.  GI recommends outpatient colonoscopy with all the irritation on her buttocks but does not want to do at this point.  Physical Exam: Vitals:   01/16/24 1657 01/16/24 2100 01/17/24 0428 01/17/24 1018  BP: (!) 132/91 130/86 130/74 (!) 163/103  Pulse: 97 94 90   Resp: 18 20 20    Temp: 97.8 F (36.6 C) 97.8 F (36.6 C) 97.8 F (36.6 C)   TempSrc: Oral Oral Oral   SpO2: 100% 100% 100%   Weight:      Height:       Physical Exam HENT:     Head: Normocephalic.     Mouth/Throat:     Pharynx: No oropharyngeal exudate.  Eyes:     General: Lids are normal.     Conjunctiva/sclera: Conjunctivae normal.  Cardiovascular:     Rate and Rhythm: Normal rate and regular rhythm.     Heart sounds: Normal heart sounds, S1 normal and S2 normal.  Pulmonary:     Breath sounds: Examination of the right-lower field reveals decreased breath sounds. Examination of the left-lower field reveals decreased breath sounds. Decreased breath sounds present. No wheezing, rhonchi or rales.  Abdominal:     Palpations: Abdomen is soft.     Tenderness: There is no abdominal tenderness.  Musculoskeletal:     Right lower leg: No swelling.     Left lower leg: No swelling.  Neurological:     Mental Status: She is alert and oriented to person, place, and time.     Data Reviewed: Sodium 131, creatinine 0.77, hemoglobin 8.8  Family Communication: spoke with husband on the phone  Disposition: Status  is: Inpatient Remains inpatient appropriate because: Will need rehab  Planned Discharge Destination: Rehab    Time spent: 28 minutes  Author: Charlie Patterson, MD 01/17/2024 3:22 PM  For on call review [email or web address].

## 2024-01-18 ENCOUNTER — Inpatient Hospital Stay

## 2024-01-18 DIAGNOSIS — R710 Precipitous drop in hematocrit: Secondary | ICD-10-CM | POA: Diagnosis not present

## 2024-01-18 DIAGNOSIS — R509 Fever, unspecified: Secondary | ICD-10-CM

## 2024-01-18 DIAGNOSIS — R112 Nausea with vomiting, unspecified: Secondary | ICD-10-CM | POA: Diagnosis not present

## 2024-01-18 DIAGNOSIS — J439 Emphysema, unspecified: Secondary | ICD-10-CM | POA: Diagnosis not present

## 2024-01-18 DIAGNOSIS — R531 Weakness: Secondary | ICD-10-CM | POA: Diagnosis not present

## 2024-01-18 DIAGNOSIS — E871 Hypo-osmolality and hyponatremia: Secondary | ICD-10-CM | POA: Diagnosis not present

## 2024-01-18 DIAGNOSIS — R059 Cough, unspecified: Secondary | ICD-10-CM | POA: Diagnosis not present

## 2024-01-18 DIAGNOSIS — F3289 Other specified depressive episodes: Secondary | ICD-10-CM | POA: Diagnosis not present

## 2024-01-18 DIAGNOSIS — N179 Acute kidney failure, unspecified: Secondary | ICD-10-CM | POA: Diagnosis not present

## 2024-01-18 DIAGNOSIS — G894 Chronic pain syndrome: Secondary | ICD-10-CM | POA: Diagnosis not present

## 2024-01-18 DIAGNOSIS — L249 Irritant contact dermatitis, unspecified cause: Secondary | ICD-10-CM | POA: Diagnosis not present

## 2024-01-18 LAB — URINALYSIS, W/ REFLEX TO CULTURE (INFECTION SUSPECTED)
Bilirubin Urine: NEGATIVE
Glucose, UA: NEGATIVE mg/dL
Ketones, ur: NEGATIVE mg/dL
Nitrite: POSITIVE — AB
Protein, ur: 100 mg/dL — AB
RBC / HPF: >50 RBC/hpf (ref 0–5)
Specific Gravity, Urine: 1.013 (ref 1.005–1.030)
WBC, UA: >50 WBC/hpf (ref 0–5)
pH: 9 — ABNORMAL HIGH (ref 5.0–8.0)

## 2024-01-18 LAB — RESPIRATORY PANEL BY PCR

## 2024-01-18 LAB — BASIC METABOLIC PANEL WITH GFR
Anion gap: 4 — ABNORMAL LOW (ref 5–15)
BUN: 16 mg/dL (ref 8–23)
CO2: 21 mmol/L — ABNORMAL LOW (ref 22–32)
Calcium: 8.3 mg/dL — ABNORMAL LOW (ref 8.9–10.3)
Chloride: 107 mmol/L (ref 98–111)
Creatinine, Ser: 0.89 mg/dL (ref 0.44–1.00)
GFR, Estimated: >60 mL/min (ref >60)
Glucose, Bld: 99 mg/dL (ref 70–99)
Potassium: 3.8 mmol/L (ref 3.5–5.1)
Sodium: 132 mmol/L — ABNORMAL LOW (ref 135–145)

## 2024-01-18 LAB — BPAM RBC
Blood Product Expiration Date: 202508192359
Blood Product Expiration Date: 202508192359
Unit Type and Rh: 5100
Unit Type and Rh: 5100

## 2024-01-18 LAB — TYPE AND SCREEN
ABO/RH(D): O POS
Antibody Screen: POSITIVE
Unit division: 0
Unit division: 0

## 2024-01-18 LAB — HEMOGLOBIN: Hemoglobin: 8.7 g/dL — ABNORMAL LOW (ref 12.0–15.0)

## 2024-01-18 LAB — VITAMIN B12: Vitamin B-12: 847 pg/mL (ref 180–914)

## 2024-01-18 LAB — SARS CORONAVIRUS 2 BY RT PCR: SARS Coronavirus 2 by RT PCR: NEGATIVE

## 2024-01-18 LAB — LACTATE DEHYDROGENASE: LDH: 200 U/L — ABNORMAL HIGH (ref 98–192)

## 2024-01-18 MED ORDER — SODIUM CHLORIDE 0.9 % IV SOLN
2.0000 g | INTRAVENOUS | Status: DC
  Administered 2024-01-18 – 2024-01-21 (×4): 2 g via INTRAVENOUS
  Filled 2024-01-18 (×5): qty 20

## 2024-01-18 MED ORDER — FOLIC ACID 1 MG PO TABS
1.0000 mg | ORAL_TABLET | Freq: Every day | ORAL | Status: DC
  Administered 2024-01-19 – 2024-01-22 (×4): 1 mg via ORAL
  Filled 2024-01-18 (×4): qty 1

## 2024-01-18 MED ORDER — FLUCONAZOLE 100 MG PO TABS
200.0000 mg | ORAL_TABLET | Freq: Once | ORAL | Status: AC
  Administered 2024-01-18: 200 mg via ORAL
  Filled 2024-01-18: qty 2

## 2024-01-18 MED ORDER — THIAMINE MONONITRATE 100 MG PO TABS
100.0000 mg | ORAL_TABLET | Freq: Every day | ORAL | Status: DC
  Administered 2024-01-19 – 2024-01-22 (×4): 100 mg via ORAL
  Filled 2024-01-18 (×4): qty 1

## 2024-01-18 MED ORDER — FLUCONAZOLE 100 MG PO TABS
100.0000 mg | ORAL_TABLET | Freq: Every day | ORAL | Status: DC
  Administered 2024-01-19 – 2024-01-22 (×4): 100 mg via ORAL
  Filled 2024-01-18 (×4): qty 1

## 2024-01-18 MED ORDER — ADULT MULTIVITAMIN W/MINERALS CH
1.0000 | ORAL_TABLET | Freq: Every day | ORAL | Status: DC
  Administered 2024-01-19 – 2024-01-22 (×4): 1 via ORAL
  Filled 2024-01-18 (×4): qty 1

## 2024-01-18 MED ORDER — PANTOPRAZOLE SODIUM 40 MG PO TBEC
40.0000 mg | DELAYED_RELEASE_TABLET | Freq: Two times a day (BID) | ORAL | Status: DC
  Administered 2024-01-18 – 2024-01-22 (×8): 40 mg via ORAL
  Filled 2024-01-18 (×8): qty 1

## 2024-01-18 NOTE — Progress Notes (Signed)
 Patient refused to have treatment done to wound on buttocks. Refused to be turn as scheduled. Requested pain medication for pain to site, Buttocks and peri area. Teaching done with patient. Charge nurse made aware. Patient remains in stable condition.

## 2024-01-18 NOTE — Assessment & Plan Note (Signed)
 Patient felt like she was going to freeze this morning and also had sweating prior to that.  Temperature 100 this afternoon.  Will get blood culture and urine culture and empirically start Rocephin  and Diflucan .  Patient has numerous skin scabs throughout the body and irritation on the buttock.  Chest x-ray negative for pneumonia

## 2024-01-18 NOTE — Progress Notes (Signed)
 Progress Note   Patient: Nicole [name] FMW:[id number] DOB: October 21, 1960 DOA: 01/15/2024     3 DOS: the patient was seen and examined on 01/18/2024   Brief hospital course: 64 y.o. female with medical history significant for anxiety, asthma, Bipolar disorder, COPD, Hepatitis C, hypertension and collagen vascular disease, who presented to the emergency room with acute onset of generalized weakness which has been worsening over the last couple of weeks with associated nausea and diminished appetite.  She has become so weak she cannot get up or out of bed unless her husband left her.  She admits to significant alcohol intake but stated that she has not been drinking over the last 4 days due to anorexia.  She only ate a pack of crackers today.  5 days ago she had a fall with subsequent left arm bruising.  No nausea or vomiting or diarrhea.  She also has gluteal ulceration which has been bleeding.  No fever or chills.  No chest pain or palpitations.  No cough or wheezing or dyspnea.  No chest pain or palpitations.  No dysuria, oliguria or hematuria or flank pain.  She has been having recurrent falls without injuries.  She drinks about a wine cooler daily.   ED Course: When she came to the ER, BP was 130/75 with heart rate 119 with otherwise normal vital signs.  Labs revealed hyponatremia 124 and hypochloremia of 87 with a blood glucose of 155, BUN of 31 and creatinine 1.18 with albumin 2.6 and total protein 6.3.  AST was 70 and ALT 34.  CBC showed leukocytosis of 14.8 and anemia lower than previous levels with hemoglobin 11.1 hematocrit 30.1 compared to 15.3 and 43.4 on 01/07/2024.  Blood group was O+ with positive antibody screen.  TSH was 1.85. EKG as reviewed by me : None Imaging: Portable chest x-ray showed the following: 1. No radiographic evidence of acute cardiopulmonary disease. 2. Chronic findings in the lungs concerning for potential interstitial lung disease. Outpatient referral to Pulmonology  for further clinical evaluation is recommended. Follow-up nonemergent high-resolution chest CT should be considered if clinically appropriate. 3. Aortic atherosclerosis.   The patient was given 1 L bolus of IV normal saline.  She will be admitted to a medical telemetry bed for further evaluation and management.  7/16.  Patient asking about her Suboxone  and complaining of her buttock being painful.  Patient stopped eating for few days.  With her drop in hemoglobin I asked her about any bleeding.  Patient stated she had some bleeding about a week ago.  Husband stated it was less than that and he thinks it was vaginal bleeding.  Patient had a total abdominal hysterectomy previously. 7/17 EGD showing duodenitis and no active bleeding.  Morning's hemoglobin was 8.5 and the afternoon was 9.1 7/18.  Patient complaining of a lot of buttock pain.  Discontinued the naltrexone  part of her Suboxone  to get better pain control. 7/19.  Low-grade fever.  This morning she felt like she was going to freeze and also had sweating.  Will get blood cultures and urine analysis.  Chest x-ray negative.  Will start empiric Rocephin  and Diflucan .  Will also send viral respiratory panel and COVID test.  Assessment and Plan: * Fever Patient felt like she was going to freeze this morning and also had sweating prior to that.  Temperature 100 this afternoon.  Will get blood culture and urine culture and empirically start Rocephin  and Diflucan .  Patient has numerous skin scabs throughout the body  and irritation on the buttock.  Chest x-ray negative for pneumonia  Drop in hemoglobin Suspected upper GI bleed.  EGD showing duodenitis hemoglobin 11.1 on presentation now 8.7 and stable.  (Last admission range between 13 and 15) continue to monitor closely.  Continue duodenitis.  GI recommends an outpatient colonoscopy.  B12 level high.  Patient also has lots of bruising and may have lost blood that way also.  Hyponatremia Sodium  132.  Generalized weakness Physical therapy recommending rehab  Intractable nausea and vomiting Improved.  Patient tolerating diet..  Protonix  ordered twice daily  AKI (acute kidney injury) (HCC) Creatinine as high as 1.18 and last creatinine down to 0.77.  Today's creatinine 0.89  Irritant dermatitis Buttock.  Cleanse buttocks/sacrum and posterior thighs and inner thighs with Vashe wound cleanser Soila [phone]) do not rinse and allowed to air dry.  Apply Gerhard's Butt cream 3 times a day.  Starting empiric Diflucan  and Rocephin .  COPD (chronic obstructive pulmonary disease) (HCC) With initial wheezing, continue nebulizer treatments.  Hold off on steroids.  Chronic pain Acute on chronic pain.  Switch Suboxone  over to buprenorphine .  Patient requiring extra pain medications for buttock pain.  Depression Continue Remeron  and Seroquel .  GERD without esophagitis Switched PPI back to oral twice daily  Essential hypertension Patient on hydralazine  and irbesartan .  Alcohol abuse Patient stated she quit drinking prior to admission.        Subjective: Patient felt like she was going to freeze this morning.  Also had an episode of sweating overnight.  Temperature this afternoon 100.  Will get blood cultures and urine culture.  Start empiric antibiotic.  Chest x-ray read as negative.  Physical Exam: Vitals:   01/18/24 0349 01/18/24 0731 01/18/24 1519 01/18/24 1523  BP: (!) 130/90 (!) 144/86  (!) 149/90  Pulse: (!) 110 (!) 110  (!) 108  Resp: 20 18  17   Temp: 99.4 F (37.4 C) 99.5 F (37.5 C) 100 F (37.8 C)   TempSrc:  Oral Oral   SpO2: 99% 97%  98%  Weight:      Height:       Physical Exam HENT:     Head: Normocephalic.     Mouth/Throat:     Pharynx: No oropharyngeal exudate.  Eyes:     General: Lids are normal.     Conjunctiva/sclera: Conjunctivae normal.  Cardiovascular:     Rate and Rhythm: Normal rate and regular rhythm.     Heart sounds: Normal heart  sounds, S1 normal and S2 normal.  Pulmonary:     Breath sounds: Examination of the right-lower field reveals decreased breath sounds. Examination of the left-lower field reveals decreased breath sounds. Decreased breath sounds present. No wheezing, rhonchi or rales.  Abdominal:     Palpations: Abdomen is soft.     Tenderness: There is no abdominal tenderness.  Musculoskeletal:     Right lower leg: No swelling.     Left lower leg: No swelling.  Skin:    Comments: Numerous areas of bruising upper extremities  Neurological:     Mental Status: She is alert and oriented to person, place, and time.     Data Reviewed: Hemoglobin 8.7, vitamin B12 847, LDH 200, creatinine 0.89, sodium 132  Family Communication: Updated husband on the phone  Disposition: Status is: Inpatient Remains inpatient appropriate because: Patient having low-grade fever, will get blood cultures and urine culture start empiric Rocephin  and Diflucan .  Patient has numerous skin irritations.  Planned Discharge Destination: Rehab  Time spent: 28 minutes  Author: Charlie Patterson, MD 01/18/2024 3:45 PM  For on call review [email or web address].

## 2024-01-18 NOTE — Plan of Care (Signed)
   Problem: Education: Goal: Knowledge of General Education information will improve Description Including pain rating scale, medication(s)/side effects and non-pharmacologic comfort measures Outcome: Progressing   Problem: Health Behavior/Discharge Planning: Goal: Ability to manage health-related needs will improve Outcome: Progressing

## 2024-01-19 DIAGNOSIS — J439 Emphysema, unspecified: Secondary | ICD-10-CM | POA: Diagnosis not present

## 2024-01-19 DIAGNOSIS — R509 Fever, unspecified: Secondary | ICD-10-CM | POA: Diagnosis not present

## 2024-01-19 DIAGNOSIS — G894 Chronic pain syndrome: Secondary | ICD-10-CM | POA: Diagnosis not present

## 2024-01-19 DIAGNOSIS — L89222 Pressure ulcer of left hip, stage 2: Secondary | ICD-10-CM | POA: Insufficient documentation

## 2024-01-19 DIAGNOSIS — I10 Essential (primary) hypertension: Secondary | ICD-10-CM | POA: Diagnosis not present

## 2024-01-19 DIAGNOSIS — R112 Nausea with vomiting, unspecified: Secondary | ICD-10-CM | POA: Diagnosis not present

## 2024-01-19 DIAGNOSIS — L249 Irritant contact dermatitis, unspecified cause: Secondary | ICD-10-CM | POA: Diagnosis not present

## 2024-01-19 DIAGNOSIS — R710 Precipitous drop in hematocrit: Secondary | ICD-10-CM | POA: Diagnosis not present

## 2024-01-19 DIAGNOSIS — E871 Hypo-osmolality and hyponatremia: Secondary | ICD-10-CM | POA: Diagnosis not present

## 2024-01-19 DIAGNOSIS — R531 Weakness: Secondary | ICD-10-CM | POA: Diagnosis not present

## 2024-01-19 LAB — CBC WITH DIFFERENTIAL/PLATELET
Abs Immature Granulocytes: 0.14 K/uL — ABNORMAL HIGH (ref 0.00–0.07)
Basophils Absolute: 0 K/uL (ref 0.0–0.1)
Basophils Relative: 0 %
Eosinophils Absolute: 0.1 K/uL (ref 0.0–0.5)
Eosinophils Relative: 2 %
HCT: 25 % — ABNORMAL LOW (ref 36.0–46.0)
Hemoglobin: 8.7 g/dL — ABNORMAL LOW (ref 12.0–15.0)
Immature Granulocytes: 3 %
Lymphocytes Relative: 21 %
Lymphs Abs: 0.9 K/uL (ref 0.7–4.0)
MCH: 35.4 pg — ABNORMAL HIGH (ref 26.0–34.0)
MCHC: 34.8 g/dL (ref 30.0–36.0)
MCV: 101.6 fL — ABNORMAL HIGH (ref 80.0–100.0)
Monocytes Absolute: 0.6 K/uL (ref 0.1–1.0)
Monocytes Relative: 13 %
Neutro Abs: 2.6 K/uL (ref 1.7–7.7)
Neutrophils Relative %: 61 %
Platelets: 257 K/uL (ref 150–400)
RBC: 2.46 MIL/uL — ABNORMAL LOW (ref 3.87–5.11)
RDW: 14.6 % (ref 11.5–15.5)
WBC: 4.3 K/uL (ref 4.0–10.5)
nRBC: 0 % (ref 0.0–0.2)

## 2024-01-19 LAB — BASIC METABOLIC PANEL WITH GFR
Anion gap: 10 (ref 5–15)
BUN: 14 mg/dL (ref 8–23)
CO2: 20 mmol/L — ABNORMAL LOW (ref 22–32)
Calcium: 8.3 mg/dL — ABNORMAL LOW (ref 8.9–10.3)
Chloride: 104 mmol/L (ref 98–111)
Creatinine, Ser: 1.11 mg/dL — ABNORMAL HIGH (ref 0.44–1.00)
GFR, Estimated: 57 mL/min — ABNORMAL LOW (ref >60)
Glucose, Bld: 132 mg/dL — ABNORMAL HIGH (ref 70–99)
Potassium: 3.4 mmol/L — ABNORMAL LOW (ref 3.5–5.1)
Sodium: 134 mmol/L — ABNORMAL LOW (ref 135–145)

## 2024-01-19 MED ORDER — SODIUM CHLORIDE 0.9 % IV BOLUS
500.0000 mL | Freq: Once | INTRAVENOUS | Status: AC
  Administered 2024-01-19: 500 mL via INTRAVENOUS

## 2024-01-19 MED ORDER — POTASSIUM CHLORIDE CRYS ER 20 MEQ PO TBCR
40.0000 meq | EXTENDED_RELEASE_TABLET | Freq: Once | ORAL | Status: AC
  Administered 2024-01-19: 40 meq via ORAL
  Filled 2024-01-19: qty 2

## 2024-01-19 MED ORDER — POLYETHYLENE GLYCOL 3350 17 G PO PACK
17.0000 g | PACK | Freq: Every day | ORAL | Status: DC
  Filled 2024-01-19: qty 1

## 2024-01-19 MED ORDER — BUDESONIDE 0.25 MG/2ML IN SUSP: 0.2500 mg | Freq: Two times a day (BID) | RESPIRATORY_TRACT | Status: DC | PRN

## 2024-01-19 NOTE — Assessment & Plan Note (Signed)
 Documented by nursing staff.  Continue rotating in the bed.

## 2024-01-19 NOTE — Progress Notes (Signed)
 Patient had been refusing to turn and to have treatment done to peri area and groin. Patient teaching done, she agreed to turn to right side completely off left hip, a stage 2 pressure observed to left hip. Area cleansed and foam dressing applied.

## 2024-01-19 NOTE — Plan of Care (Signed)
   Problem: Education: Goal: Knowledge of General Education information will improve Description: Including pain rating scale, medication(s)/side effects and non-pharmacologic comfort measures Outcome: Progressing   Problem: Clinical Measurements: Goal: Ability to maintain clinical measurements within normal limits will improve Outcome: Progressing

## 2024-01-19 NOTE — Progress Notes (Signed)
 Progress Note   Patient: Nicole [name] FMW:[id number] DOB: 14-Apr-1961 DOA: 01/15/2024     4 DOS: the patient was seen and examined on 01/19/2024   Brief hospital course: 63 y.o. female with medical history significant for anxiety, asthma, Bipolar disorder, COPD, Hepatitis C, hypertension and collagen vascular disease, who presented to the emergency room with acute onset of generalized weakness which has been worsening over the last couple of weeks with associated nausea and diminished appetite.  She has become so weak she cannot get up or out of bed unless her husband left her.  She admits to significant alcohol intake but stated that she has not been drinking over the last 4 days due to anorexia.  She only ate a pack of crackers today.  5 days ago she had a fall with subsequent left arm bruising.  No nausea or vomiting or diarrhea.  She also has gluteal ulceration which has been bleeding.  No fever or chills.  No chest pain or palpitations.  No cough or wheezing or dyspnea.  No chest pain or palpitations.  No dysuria, oliguria or hematuria or flank pain.  She has been having recurrent falls without injuries.  She drinks about a wine cooler daily.   ED Course: When she came to the ER, BP was 130/75 with heart rate 119 with otherwise normal vital signs.  Labs revealed hyponatremia 124 and hypochloremia of 87 with a blood glucose of 155, BUN of 31 and creatinine 1.18 with albumin 2.6 and total protein 6.3.  AST was 70 and ALT 34.  CBC showed leukocytosis of 14.8 and anemia lower than previous levels with hemoglobin 11.1 hematocrit 30.1 compared to 15.3 and 43.4 on 01/07/2024.  Blood group was O+ with positive antibody screen.  TSH was 1.85. EKG as reviewed by me : None Imaging: Portable chest x-ray showed the following: 1. No radiographic evidence of acute cardiopulmonary disease. 2. Chronic findings in the lungs concerning for potential interstitial lung disease. Outpatient referral to Pulmonology  for further clinical evaluation is recommended. Follow-up nonemergent high-resolution chest CT should be considered if clinically appropriate. 3. Aortic atherosclerosis.   The patient was given 1 L bolus of IV normal saline.  She will be admitted to a medical telemetry bed for further evaluation and management.  7/16.  Patient asking about her Suboxone  and complaining of her buttock being painful.  Patient stopped eating for few days.  With her drop in hemoglobin I asked her about any bleeding.  Patient stated she had some bleeding about a week ago.  Husband stated it was less than that and he thinks it was vaginal bleeding.  Patient had a total abdominal hysterectomy previously. 7/17 EGD showing duodenitis and no active bleeding.  Morning's hemoglobin was 8.5 and the afternoon was 9.1 7/18.  Patient complaining of a lot of buttock pain.  Discontinued the naltrexone  part of her Suboxone  to get better pain control. 7/19.  Low-grade fever.  This morning she felt like she was going to freeze and also had sweating.  Will get blood cultures and urine analysis.  Chest x-ray negative.  Will start empiric Rocephin  and Diflucan .  Will also send viral respiratory panel and COVID test.  Assessment and Plan: * Fever I started empiric antibiotics Rocephin  and Diflucan .  She had a temperature of 100 yesterday.  COVID test and viral respiratory panel negative.  Blood cultures so far negative.  This is not a urine infection.  I did not realize patient still had a  catheter and we sent the urine from a catheter.  This is colonization.  Since the patient does have numerous skin tears and scabs and irritation on the buttock likely skin would be the source of infection.  Drop in hemoglobin Suspected upper GI bleed.  EGD showing duodenitis hemoglobin 11.1 on presentation now 8.7 and stable.  (Last admission range between 13 and 15) continue to monitor closely.  Continue duodenitis.  GI recommends an outpatient  colonoscopy.  B12 level high.  Patient also has lots of bruising and may have lost blood that way also.  Hyponatremia Sodium 134.  Generalized weakness Physical therapy recommending rehab  Intractable nausea and vomiting Improved.  Patient tolerating diet..  Protonix  ordered twice daily  Decubitus ulcer of left hip, stage 2 (HCC) Documented by nursing staff.  Continue rotating in the bed.  AKI (acute kidney injury) (HCC) Creatinine as high as 1.18 and last creatinine down to 0.77.  Today's creatinine up at 1.11.  Will give a fluid bolus.  Irritant dermatitis Buttock.  Cleanse buttocks/sacrum and posterior thighs and inner thighs with Vashe wound cleanser Soila [phone]) do not rinse and allowed to air dry.  Apply Gerhard's Butt cream 3 times a day.  Continue empiric Diflucan  and Rocephin .  COPD (chronic obstructive pulmonary disease) (HCC) With initial wheezing, continue nebulizer treatments.  Hold off on steroids.  Chronic pain Acute on chronic pain.  Switch Suboxone  over to buprenorphine .  Patient requiring extra pain medications for buttock pain.  Concerned that she is taking quite a bit of medications complaining of 10 out of 10 pain all the time.  Depression Continue Remeron  and Seroquel .  GERD without esophagitis Switched PPI back to oral twice daily  Essential hypertension Patient on hydralazine  and irbesartan .  Alcohol abuse Patient stated she quit drinking prior to admission.      Subjective: Patient still complaining of 10 out of 10 pain in her buttocks.  No further fever.  Nursing staff noted a decubitus ulcer on her left hip.  Physical Exam: Vitals:   01/19/24 0453 01/19/24 0720 01/19/24 1520 01/19/24 1611  BP: 120/77 134/86 (!) 135/92 125/78  Pulse: 98 (!) 104 (!) 114   Resp: 16 17 17 18   Temp: 98.6 F (37 C) 99.5 F (37.5 C) 98.8 F (37.1 C) 98.7 F (37.1 C)  TempSrc: Oral Oral Oral   SpO2: 99% 100% 100% 99%  Weight:      Height:        Physical Exam HENT:     Head: Normocephalic.     Mouth/Throat:     Pharynx: No oropharyngeal exudate.  Eyes:     General: Lids are normal.     Conjunctiva/sclera: Conjunctivae normal.  Cardiovascular:     Rate and Rhythm: Normal rate and regular rhythm.     Heart sounds: Normal heart sounds, S1 normal and S2 normal.  Pulmonary:     Breath sounds: Examination of the right-lower field reveals decreased breath sounds. Examination of the left-lower field reveals decreased breath sounds. Decreased breath sounds present. No wheezing, rhonchi or rales.  Abdominal:     Palpations: Abdomen is soft.     Tenderness: There is no abdominal tenderness.  Musculoskeletal:     Right lower leg: No swelling.     Left lower leg: No swelling.  Skin:    Comments: Numerous areas of bruising upper extremities.  Irritation bilateral buttock.  Stage II decubiti of left hip.  Neurological:     Mental Status: She is alert  and oriented to person, place, and time.     Data Reviewed: Blood cultures negative for less than 24 hours, respiratory panel negative COVID test negative Urine culture was sent but from a catheter.  I do not think this is urinary tract infection I think this is colonization. Creatinine 1.11, potassium 3.4, sodium 134, GFR 57  Family Communication: Updated husband on the phone  Disposition: Status is: Inpatient Remains inpatient appropriate because: Concerned that she complains of 10 out of 10 pain all the time.  Having to give me more medications that she usually takes.  Planned Discharge Destination: Rehab    Time spent: 28 minutes  Author: Charlie Patterson, MD 01/19/2024 4:23 PM  For on call review [email or web address].

## 2024-01-19 NOTE — Anesthesia Postprocedure Evaluation (Signed)
 Anesthesia Post Note  Patient: Nicole [name]  Procedure(s) Performed: EGD (ESOPHAGOGASTRODUODENOSCOPY)  Patient location during evaluation: Endoscopy Anesthesia Type: General Level of consciousness: awake and alert Pain management: pain level controlled Vital Signs Assessment: post-procedure vital signs reviewed and stable Respiratory status: spontaneous breathing, nonlabored ventilation, respiratory function stable and patient connected to nasal cannula oxygen Cardiovascular status: blood pressure returned to baseline and stable Postop Assessment: no apparent nausea or vomiting Anesthetic complications: no   No notable events documented.   Last Vitals:  Vitals:   01/19/24 0453 01/19/24 0720  BP: 120/77 134/86  Pulse: 98 (!) 104  Resp: 16 17  Temp: 37 C 37.5 C  SpO2: 99% 100%    Last Pain:  Vitals:   01/19/24 0720  TempSrc: Oral  PainSc:                  Prentice Murphy

## 2024-01-20 DIAGNOSIS — R531 Weakness: Secondary | ICD-10-CM | POA: Diagnosis not present

## 2024-01-20 DIAGNOSIS — R509 Fever, unspecified: Secondary | ICD-10-CM | POA: Diagnosis not present

## 2024-01-20 DIAGNOSIS — R710 Precipitous drop in hematocrit: Secondary | ICD-10-CM | POA: Diagnosis not present

## 2024-01-20 DIAGNOSIS — I10 Essential (primary) hypertension: Secondary | ICD-10-CM | POA: Diagnosis not present

## 2024-01-20 DIAGNOSIS — E871 Hypo-osmolality and hyponatremia: Secondary | ICD-10-CM | POA: Diagnosis not present

## 2024-01-20 DIAGNOSIS — L249 Irritant contact dermatitis, unspecified cause: Secondary | ICD-10-CM | POA: Diagnosis not present

## 2024-01-20 DIAGNOSIS — N179 Acute kidney failure, unspecified: Secondary | ICD-10-CM | POA: Diagnosis not present

## 2024-01-20 DIAGNOSIS — R112 Nausea with vomiting, unspecified: Secondary | ICD-10-CM | POA: Diagnosis not present

## 2024-01-20 DIAGNOSIS — G894 Chronic pain syndrome: Secondary | ICD-10-CM | POA: Diagnosis not present

## 2024-01-20 DIAGNOSIS — L89222 Pressure ulcer of left hip, stage 2: Secondary | ICD-10-CM | POA: Diagnosis not present

## 2024-01-20 DIAGNOSIS — J439 Emphysema, unspecified: Secondary | ICD-10-CM | POA: Diagnosis not present

## 2024-01-20 LAB — URINE CULTURE: Culture: NO GROWTH

## 2024-01-20 MED ORDER — OXYCODONE HCL 5 MG PO TABS
10.0000 mg | ORAL_TABLET | Freq: Four times a day (QID) | ORAL | Status: DC | PRN
  Administered 2024-01-20 – 2024-01-21 (×2): 10 mg via ORAL
  Filled 2024-01-20 (×3): qty 2

## 2024-01-20 MED ORDER — LACTULOSE 10 GM/15ML PO SOLN
30.0000 g | Freq: Once | ORAL | Status: DC
  Filled 2024-01-20: qty 60

## 2024-01-20 MED ORDER — POLYETHYLENE GLYCOL 3350 17 G PO PACK
17.0000 g | PACK | Freq: Two times a day (BID) | ORAL | Status: DC
  Filled 2024-01-20 (×2): qty 1

## 2024-01-20 NOTE — Progress Notes (Signed)
 PT Cancellation Note  Patient Details Name: Nicole [name] MRN: [id number] DOB: 04-22-1961   Cancelled Treatment:    Reason Eval/Treat Not Completed: Other (comment).  Chart reviewed and attempted to see pt.  Pt wanting to get over to the [facility] and therapist offered to assist nursing to help her over to the [facility].  Pt not wanting a female to be in the room and harshly declined.  Pt also stating she was not going to work with therapy at all, and was educated on needing to consistently work with therapies in for STR placement.  Pt voiced that she did not care, that she would go home if that was the case while also yelling obscenities.  Therapist left room.  Will re-attempt as medically appropriate.     Fonda Simpers, PT, DPT Physical Therapist - [facility]  01/20/24, 3:15 PM

## 2024-01-20 NOTE — TOC Progression Note (Signed)
 Transition of Care [facility]) - Progression Note    Patient Details  Name: Nicole [name] MRN: [id number] Date of Birth: 1960/11/06  [facility] CM/SW Contact  Elouise LULLA Capri, RN Phone Number: 01/20/2024, 5:23 PM  Clinical Narrative:     CM follow up call placed to [place], Admissions, [employer] regarding SNF referral. Per [place], SNF can accept at sister facility, [address] [place], [place], [place]. CM to patient's room regarding accepting [employer]. Per patient will discuss with patient's husband and inform CM.   Expected Discharge Plan: Skilled Nursing Facility Barriers to Discharge: Continued Medical Work up  Expected Discharge Plan and Services     SNF Living arrangements for the past 2 months: Single Family Home    Social Determinants of Health (SDOH) Interventions SDOH Screenings   Food Insecurity: No Food Insecurity (01/15/2024)  Housing: Low Risk  (01/15/2024)  Transportation Needs: No Transportation Needs (01/15/2024)  Utilities: Not At Risk (01/15/2024)  Alcohol Screen: Low Risk  (10/03/2021)  Depression (PHQ2-9): High Risk (04/17/2022)  Tobacco Use: High Risk (01/16/2024)    Readmission Risk Interventions     No data to display

## 2024-01-20 NOTE — Progress Notes (Signed)
 Occupational Therapy Treatment Patient Details Name: Nicole [name] MRN: [id number] DOB: June 09, 1961 Today's Date: 01/20/2024   History of present illness Pt is a 63 y/o F admitted on 01/15/24 after presenting with c/o acute onset of generalized weakness that has been worsening over the last couple of weeks, associated with nausea & decreased appetite; pt also endorses significant alcohol intake. Pt is being treated for hyponatremia. PMH: anxiety, asthma, bipolar disorder, Hep C, COPD, HTN, collagen vascular disease, empyema   OT comments  Pt seen for OT tx this date; received pain meds prior to session with reported pain 9/10 in buttock area. Pt participates in session with max encouragement. Improved tolerance to sit EOB for extended period (15+ mins) while combing hair. Pt is able to comb front and sides of hair while seated, requires MAX A for de-tangling of matts on back of head. MIN A to doff/don clean gown. Edu provided to pt on importance of frequent repositioning and continued efforts towards progressing mobility (pt declining to attempt walking or standing due to fatigue/pain levels, pt was able to transfer t/f [facility] with NT earlier with +1 assist). Discharge recommendation remains appropriate, patient will benefit from continued inpatient follow up therapy, <3 hours/day       If plan is discharge home, recommend the following:  A lot of help with walking and/or transfers;A lot of help with bathing/dressing/bathroom;Help with stairs or ramp for entrance;Assistance with cooking/housework;Direct supervision/assist for medications management;Direct supervision/assist for financial management   Equipment Recommendations  Other (comment)    Recommendations for Other Services      Precautions / Restrictions Precautions Precautions: Fall Recall of Precautions/Restrictions: Impaired Precaution/Restrictions Comments: reduce shearing forces on buttocks Restrictions Weight Bearing  Restrictions Per Provider Order: No       Mobility Bed Mobility Overal bed mobility: Needs Assistance Bed Mobility: Rolling Rolling: Supervision, Used rails   Supine to sit: Supervision     General bed mobility comments: pt does not require physical assist; however, requires excessive time and heavy use of bed features to perform    Transfers                         Balance Overall balance assessment: Needs assistance Sitting-balance support: Bilateral upper extremity supported   Sitting balance - Comments: reliant on HOB elevated for support seated. Pt cannot sit upright due to buttock pain/discomfort, prefers to elevate HOB and lean on L side with pillow. able to maintain balance for short periods without UE support to comb hair Postural control: Left lateral lean     Standing balance comment: NT                           ADL either performed or assessed with clinical judgement   ADL Overall ADL's : Needs assistance/impaired     Grooming: Brushing hair;Sitting;Maximal assistance Grooming Details (indicate cue type and reason): sitting EOB, pt reliant on use of bed features for supported sitting postion. pt is able to comb front/sides of hair with max encourgement, OT provides MAX A for detangling/de-matting around back. edu to pt re: frequent brushing and use of conditioner to prevent further tangles. most of mats were combed out, pt's hair placed in braid         Upper Body Dressing : Minimal assistance Upper Body Dressing Details (indicate cue type and reason): doff/don gown  General ADL Comments: Pt recently up to [facility] with NT for large BM. Pt declines OOB mobility, declined standing to take a few lateral sidesteps. Educated pt on importance of mobility and continued work with therapy team for preventation of further deconditioning.     Communication Communication Communication: No apparent difficulties   Cognition  Arousal: Alert Behavior During Therapy: Anxious Cognition: No family/caregiver present to determine baseline, Cognition impaired     Awareness: Intellectual awareness impaired, Online awareness impaired   Attention impairment (select first level of impairment): Sustained attention Executive functioning impairment (select all impairments): Reasoning, Problem solving OT - Cognition Comments: max encourgement to participate                 Following commands: Impaired Following commands impaired: Follows one step commands with increased time      Cueing   Cueing Techniques: Verbal cues, Gestural cues, Tactile cues, Visual cues             Pertinent Vitals/ Pain       Pain Assessment Pain Score: 9  Pain Location: buttocks Pain Descriptors / Indicators: Discomfort, Crying, Moaning, Grimacing, Guarding Pain Intervention(s): Limited activity within patient's tolerance, Monitored during session, Premedicated before session   Frequency  Min 2X/week        Progress Toward Goals  OT Goals(current goals can now be found in the care plan section)  Progress towards OT goals: Progressing toward goals  Acute Rehab OT Goals OT Goal Formulation: With patient Time For Goal Achievement: 01/29/24 Potential to Achieve Goals: Good  Plan         AM-PAC OT 6 Clicks Daily Activity     Outcome Measure   Help from another person eating meals?: None Help from another person taking care of personal grooming?: A Little Help from another person toileting, which includes using toliet, bedpan, or urinal?: A Lot Help from another person bathing (including washing, rinsing, drying)?: A Lot Help from another person to put on and taking off regular upper body clothing?: A Little Help from another person to put on and taking off regular lower body clothing?: A Lot 6 Click Score: 16    End of Session    OT Visit Diagnosis: Other abnormalities of gait and mobility (R26.89);Muscle  weakness (generalized) (M62.81)   Activity Tolerance Patient limited by pain   Patient Left in bed;with bed alarm set;with chair alarm set   Nurse Communication Mobility status        Time: 9095-9072 OT Time Calculation (min): 23 min  Charges: OT General Charges $OT Visit: 1 Visit OT Treatments $Self Care/Home Management : 23-37 mins  Raianna Slight L. Adolpho Meenach, OTR/L  01/20/24, 9:43 AM

## 2024-01-20 NOTE — Plan of Care (Signed)
   Problem: Education: Goal: Knowledge of General Education information will improve Description Including pain rating scale, medication(s)/side effects and non-pharmacologic comfort measures Outcome: Progressing

## 2024-01-20 NOTE — Progress Notes (Addendum)
 Progress Note   Patient: Nicole [name] FMW:[id number] DOB: 03-24-1961 DOA: 01/15/2024     5 DOS: the patient was seen and examined on 01/20/2024   Brief hospital course: 63 y.o. female with medical history significant for anxiety, asthma, Bipolar disorder, COPD, Hepatitis C, hypertension and collagen vascular disease, who presented to the emergency room with acute onset of generalized weakness which has been worsening over the last couple of weeks with associated nausea and diminished appetite.  She has become so weak she cannot get up or out of bed unless her husband left her.  She admits to significant alcohol intake but stated that she has not been drinking over the last 4 days due to anorexia.  She only ate a pack of crackers today.  5 days ago she had a fall with subsequent left arm bruising.  No nausea or vomiting or diarrhea.  She also has gluteal ulceration which has been bleeding.  No fever or chills.  No chest pain or palpitations.  No cough or wheezing or dyspnea.  No chest pain or palpitations.  No dysuria, oliguria or hematuria or flank pain.  She has been having recurrent falls without injuries.  She drinks about a wine cooler daily.   ED Course: When she came to the ER, BP was 130/75 with heart rate 119 with otherwise normal vital signs.  Labs revealed hyponatremia 124 and hypochloremia of 87 with a blood glucose of 155, BUN of 31 and creatinine 1.18 with albumin 2.6 and total protein 6.3.  AST was 70 and ALT 34.  CBC showed leukocytosis of 14.8 and anemia lower than previous levels with hemoglobin 11.1 hematocrit 30.1 compared to 15.3 and 43.4 on 01/07/2024.  Blood group was O+ with positive antibody screen.  TSH was 1.85. EKG as reviewed by me : None Imaging: Portable chest x-ray showed the following: 1. No radiographic evidence of acute cardiopulmonary disease. 2. Chronic findings in the lungs concerning for potential interstitial lung disease. Outpatient referral to Pulmonology  for further clinical evaluation is recommended. Follow-up nonemergent high-resolution chest CT should be considered if clinically appropriate. 3. Aortic atherosclerosis.   The patient was given 1 L bolus of IV normal saline.  She will be admitted to a medical telemetry bed for further evaluation and management.  7/16.  Patient asking about her Suboxone  and complaining of her buttock being painful.  Patient stopped eating for few days.  With her drop in hemoglobin I asked her about any bleeding.  Patient stated she had some bleeding about a week ago.  Husband stated it was less than that and he thinks it was vaginal bleeding.  Patient had a total abdominal hysterectomy previously. 7/17 EGD showing duodenitis and no active bleeding.  Morning's hemoglobin was 8.5 and the afternoon was 9.1 7/18.  Patient complaining of a lot of buttock pain.  Discontinued the naltrexone  part of her Suboxone  to get better pain control. 7/19.  Low-grade fever.  This morning she felt like she was going to freeze and also had sweating.  Will get blood cultures and urine analysis.  Chest x-ray negative.  Will start empiric Rocephin  and Diflucan .  COVID and respiratory panel negative 7/20.  No further fever.  Continue empiric Rocephin  and Diflucan . 7/21.  Still having quite a bit of pain in the buttock will increase oxycodone  to 10 mg as needed and get rid of buprenorphine .  Assessment and Plan: * Fever I started empiric antibiotics Rocephin  and Diflucan  on 7/19.  COVID test and  viral respiratory panel negative.  Blood cultures so far negative.  This is not a urine infection.  Rocephin  would cover skin organisms and Diflucan  would cover any fungal dermatitis.  Drop in hemoglobin Suspected upper GI bleed.  EGD showing duodenitis hemoglobin 11.1 on presentation now 8.7 and stable.  (Last admission range between 13 and 15) continue to monitor closely.  EGD showed duodenitis.  GI recommends an outpatient colonoscopy.  B12  level high.  Patient also has lots of bruising and may have lost blood that way also.  Hyponatremia Sodium 134.  Generalized weakness Physical therapy recommending rehab  Intractable nausea and vomiting Improved.  Patient tolerating diet.  Protonix  ordered twice daily  Decubitus ulcer of left hip, stage 2 (HCC) Documented by nursing staff.  Continue rotating in the bed.  AKI (acute kidney injury) (HCC) Creatinine as high as 1.18 and last creatinine down to 0.77.  Yesterday's creatinine 1.11.  Check BMP tomorrow  Irritant dermatitis Buttock.  Cleanse buttocks/sacrum and posterior thighs and inner thighs with Vashe wound cleanser Soila [phone]) do not rinse and allowed to air dry.  Apply Gerhard's Butt cream 3 times a day.  Continue empiric Diflucan  and Rocephin .  COPD (chronic obstructive pulmonary disease) (HCC) With initial wheezing, continue nebulizer treatments as needed  Chronic pain Acute on chronic pain.  Discontinue buprenorphine .  Increase oxycodone  to 10 mg as needed.  Depression Continue Remeron  and Seroquel .  GERD without esophagitis Switched PPI back to oral twice daily  Essential hypertension Patient on hydralazine  and irbesartan .  Alcohol abuse Patient stated she quit drinking prior to admission.        Subjective: Patient still having a lot of pain in the buttock.  Patient stated that she walked to the commode to have a bowel movement.  Initially admitted with a fall and poor appetite and found to be hyponatremic  Physical Exam: Vitals:   01/19/24 2000 01/20/24 0319 01/20/24 0730 01/20/24 1508  BP: (!) 145/92 130/80 129/84 (!) 139/94  Pulse: 100 (!) 106 95 93  Resp: 16 18 17 17   Temp: 98.6 F (37 C) 98.5 F (36.9 C) 98.4 F (36.9 C) 98.9 F (37.2 C)  TempSrc:      SpO2: 100% 100% 99% 99%  Weight:      Height:       Physical Exam HENT:     Head: Normocephalic.     Mouth/Throat:     Pharynx: No oropharyngeal exudate.  Eyes:      General: Lids are normal.     Conjunctiva/sclera: Conjunctivae normal.  Cardiovascular:     Rate and Rhythm: Normal rate and regular rhythm.     Heart sounds: Normal heart sounds, S1 normal and S2 normal.  Pulmonary:     Breath sounds: Examination of the right-lower field reveals decreased breath sounds. Examination of the left-lower field reveals decreased breath sounds. Decreased breath sounds present. No wheezing, rhonchi or rales.  Abdominal:     Palpations: Abdomen is soft.     Tenderness: There is no abdominal tenderness.  Musculoskeletal:     Right lower leg: No swelling.     Left lower leg: No swelling.  Skin:    Comments: Numerous areas of bruising upper extremities.  Irritation bilateral buttock.  Stage II decubiti of left hip.  Neurological:     Mental Status: She is alert and oriented to person, place, and time.     Data Reviewed: No new data today  Family Communication: Spoke with husband on the  phone  Disposition: Status is: Inpatient Remains inpatient appropriate because: TOC looking into rehab, medically stable  Planned Discharge Destination: Rehab    Time spent: 28 minutes  Author: Charlie Patterson, MD 01/20/2024 5:00 PM  For on call review [email or web address].

## 2024-01-21 ENCOUNTER — Encounter: Payer: Self-pay | Admitting: Pediatrics

## 2024-01-21 DIAGNOSIS — F1090 Alcohol use, unspecified, uncomplicated: Secondary | ICD-10-CM

## 2024-01-21 DIAGNOSIS — E871 Hypo-osmolality and hyponatremia: Secondary | ICD-10-CM | POA: Diagnosis not present

## 2024-01-21 DIAGNOSIS — R112 Nausea with vomiting, unspecified: Secondary | ICD-10-CM | POA: Diagnosis not present

## 2024-01-21 DIAGNOSIS — G894 Chronic pain syndrome: Secondary | ICD-10-CM | POA: Diagnosis not present

## 2024-01-21 DIAGNOSIS — L249 Irritant contact dermatitis, unspecified cause: Secondary | ICD-10-CM | POA: Diagnosis not present

## 2024-01-21 DIAGNOSIS — R509 Fever, unspecified: Secondary | ICD-10-CM | POA: Diagnosis not present

## 2024-01-21 DIAGNOSIS — L89222 Pressure ulcer of left hip, stage 2: Secondary | ICD-10-CM | POA: Diagnosis not present

## 2024-01-21 DIAGNOSIS — R531 Weakness: Secondary | ICD-10-CM | POA: Diagnosis not present

## 2024-01-21 DIAGNOSIS — R710 Precipitous drop in hematocrit: Secondary | ICD-10-CM | POA: Diagnosis not present

## 2024-01-21 DIAGNOSIS — N179 Acute kidney failure, unspecified: Secondary | ICD-10-CM | POA: Diagnosis not present

## 2024-01-21 DIAGNOSIS — F3289 Other specified depressive episodes: Secondary | ICD-10-CM | POA: Diagnosis not present

## 2024-01-21 LAB — CBC
HCT: 25.3 % — ABNORMAL LOW (ref 36.0–46.0)
Hemoglobin: 8.6 g/dL — ABNORMAL LOW (ref 12.0–15.0)
MCH: 35.4 pg — ABNORMAL HIGH (ref 26.0–34.0)
MCHC: 34 g/dL (ref 30.0–36.0)
MCV: 104.1 fL — ABNORMAL HIGH (ref 80.0–100.0)
Platelets: 270 K/uL (ref 150–400)
RBC: 2.43 MIL/uL — ABNORMAL LOW (ref 3.87–5.11)
RDW: 14.6 % (ref 11.5–15.5)
WBC: 4.6 K/uL (ref 4.0–10.5)
nRBC: 0 % (ref 0.0–0.2)

## 2024-01-21 LAB — BASIC METABOLIC PANEL WITH GFR
Anion gap: 8 (ref 5–15)
BUN: 15 mg/dL (ref 8–23)
CO2: 20 mmol/L — ABNORMAL LOW (ref 22–32)
Calcium: 8.2 mg/dL — ABNORMAL LOW (ref 8.9–10.3)
Chloride: 107 mmol/L (ref 98–111)
Creatinine, Ser: 0.71 mg/dL (ref 0.44–1.00)
GFR, Estimated: >60 mL/min (ref >60)
Glucose, Bld: 93 mg/dL (ref 70–99)
Potassium: 3.6 mmol/L (ref 3.5–5.1)
Sodium: 135 mmol/L (ref 135–145)

## 2024-01-21 MED ORDER — ALBUTEROL SULFATE (2.5 MG/3ML) 0.083% IN NEBU: 3.0000 mL | INHALATION_SOLUTION | Freq: Four times a day (QID) | RESPIRATORY_TRACT | Status: DC | PRN

## 2024-01-21 MED ORDER — UMECLIDINIUM-VILANTEROL 62.5-25 MCG/ACT IN AEPB
1.0000 | INHALATION_SPRAY | Freq: Every day | RESPIRATORY_TRACT | Status: DC
  Administered 2024-01-21 – 2024-01-22 (×2): 1 via RESPIRATORY_TRACT
  Filled 2024-01-21: qty 14

## 2024-01-21 NOTE — Progress Notes (Signed)
 PT Cancellation Note  Patient Details Name: Nicole [name] MRN: [id number] DOB: [birth year]   Cancelled Treatment:    Reason Eval/Treat Not Completed: Other (comment).  Chart reviewed and attempted to see pt.  Nursing in room and pt requesting to not be seen by PT at this moment.  Will re-attempt at later date/time as medically appropriate.     Fonda Simpers, PT, DPT Physical Therapist - [facility]  01/21/24, 4:29 PM

## 2024-01-21 NOTE — Progress Notes (Signed)
 Progress Note   Patient: Nicole [name] FMW:[id number] DOB: May 08, 1961 DOA: 01/15/2024     6 DOS: the patient was seen and examined on 01/21/2024   Brief hospital course: 63 y.o. female with medical history significant for anxiety, asthma, Bipolar disorder, COPD, Hepatitis C, hypertension and collagen vascular disease, who presented to the emergency room with acute onset of generalized weakness which has been worsening over the last couple of weeks with associated nausea and diminished appetite.  She has become so weak she cannot get up or out of bed unless her husband left her.  She admits to significant alcohol intake but stated that she has not been drinking over the last 4 days due to anorexia.  She only ate a pack of crackers today.  5 days ago she had a fall with subsequent left arm bruising.  No nausea or vomiting or diarrhea.  She also has gluteal ulceration which has been bleeding.  No fever or chills.  No chest pain or palpitations.  No cough or wheezing or dyspnea.  No chest pain or palpitations.  No dysuria, oliguria or hematuria or flank pain.  She has been having recurrent falls without injuries.  She drinks about a wine cooler daily.   ED Course: When she came to the ER, BP was 130/75 with heart rate 119 with otherwise normal vital signs.  Labs revealed hyponatremia 124 and hypochloremia of 87 with a blood glucose of 155, BUN of 31 and creatinine 1.18 with albumin 2.6 and total protein 6.3.  AST was 70 and ALT 34.  CBC showed leukocytosis of 14.8 and anemia lower than previous levels with hemoglobin 11.1 hematocrit 30.1 compared to 15.3 and 43.4 on 01/07/2024.  Blood group was O+ with positive antibody screen.  TSH was 1.85. EKG as reviewed by me : None Imaging: Portable chest x-ray showed the following: 1. No radiographic evidence of acute cardiopulmonary disease. 2. Chronic findings in the lungs concerning for potential interstitial lung disease. Outpatient referral to Pulmonology  for further clinical evaluation is recommended. Follow-up nonemergent high-resolution chest CT should be considered if clinically appropriate. 3. Aortic atherosclerosis.   The patient was given 1 L bolus of IV normal saline.  She will be admitted to a medical telemetry bed for further evaluation and management.  7/16.  Patient asking about her Suboxone  and complaining of her buttock being painful.  Patient stopped eating for few days.  With her drop in hemoglobin I asked her about any bleeding.  Patient stated she had some bleeding about a week ago.  Husband stated it was less than that and he thinks it was vaginal bleeding.  Patient had a total abdominal hysterectomy previously. 7/17 EGD showing duodenitis and no active bleeding.  Morning's hemoglobin was 8.5 and the afternoon was 9.1 7/18.  Patient complaining of a lot of buttock pain.  Discontinued the naltrexone  part of her Suboxone  to get better pain control. 7/19.  Low-grade fever.  This morning she felt like she was going to freeze and also had sweating.  Will get blood cultures and urine analysis.  Chest x-ray negative.  Will start empiric Rocephin  and Diflucan .  COVID and respiratory panel negative 7/20.  No further fever.  Continue empiric Rocephin  and Diflucan . 7/21.  Still having quite a bit of pain in the buttock will increase oxycodone  to 10 mg as needed and get rid of buprenorphine . 7/22.  Patient declined rehab bed in [place].  No local bed offers.  Will try to set up  equipment for going home with DME wheelchair, 3 in 1 commode and walker.  Assessment and Plan: * Fever I started empiric antibiotics Rocephin  and Diflucan  on 7/19.  COVID test and viral respiratory panel negative.  Blood cultures so far negative.  This is not a urine infection.  Rocephin  would cover skin organisms and Diflucan  would cover any fungal dermatitis.  Drop in hemoglobin Suspected upper GI bleed.  EGD showing duodenitis.  Hemoglobin 11.1 on  presentation now 8.6 and stable.  (Last admission range between 13 and 15) continue to monitor closely.  GI recommends an outpatient colonoscopy.  B12 level high.  Patient also has lots of bruising and may have lost blood that way also.  Hyponatremia Last sodium in the normal range  Generalized weakness Physical therapy recommending rehab.  Patient declined the 1 rehab bed offer that she got.  Intractable nausea and vomiting Improved.  Patient tolerating diet.  Protonix  ordered twice daily  Decubitus ulcer of left hip, stage 2 (HCC) Documented by nursing staff.  Continue rotating in the bed.  AKI (acute kidney injury) (HCC) Creatinine as high as 1.18 and last creatinine down to 0.71.  Irritant dermatitis Buttock.  Cleanse buttocks/sacrum and posterior thighs and inner thighs with Vashe wound cleanser Soila [phone]) do not rinse and allowed to air dry.  Apply Gerhard's Butt cream 3 times a day.  Continue empiric Diflucan  and Rocephin  for now.  Plan on sending patient home with Foley catheter.  Since we are unable to set up home health I will have to refer to urology to change the Foley in 3 weeks or discontinued in 3 weeks if her buttock is better.  COPD (chronic obstructive pulmonary disease) (HCC) With initial wheezing, continue nebulizer treatments as needed  Chronic pain Acute on chronic pain.  Discontinued buprenorphine .  Increased oxycodone  to 10 mg as needed.  Depression Continue Remeron  and Seroquel .  GERD without esophagitis Switched PPI back to oral twice daily  Essential hypertension Patient on hydralazine  and irbesartan .  Alcohol use disorder Patient stated she quit drinking prior to admission.        Subjective: Patient not feeling well this morning complains of upset stomach.  Had a few bowel movements yesterday.  Physical Exam: Vitals:   01/20/24 1508 01/20/24 1955 01/21/24 0300 01/21/24 0758  BP: (!) 139/94 (!) 143/91 116/77 131/81  Pulse: 93 93 100  92  Resp: 17 18 18 18   Temp: 98.9 F (37.2 C) 98.7 F (37.1 C) 98.4 F (36.9 C) 99.1 F (37.3 C)  TempSrc:      SpO2: 99% 100% 100% 99%  Weight:      Height:       Physical Exam HENT:     Head: Normocephalic.     Mouth/Throat:     Pharynx: No oropharyngeal exudate.  Eyes:     General: Lids are normal.     Conjunctiva/sclera: Conjunctivae normal.  Cardiovascular:     Rate and Rhythm: Normal rate and regular rhythm.     Heart sounds: Normal heart sounds, S1 normal and S2 normal.  Pulmonary:     Breath sounds: Examination of the right-lower field reveals decreased breath sounds. Examination of the left-lower field reveals decreased breath sounds. Decreased breath sounds present. No wheezing, rhonchi or rales.  Abdominal:     Palpations: Abdomen is soft.     Tenderness: There is no abdominal tenderness.  Musculoskeletal:     Right lower leg: No swelling.     Left lower leg: No  swelling.  Skin:    Comments: Numerous areas of bruising upper extremities.  Irritation bilateral buttock.  Stage II decubiti of left hip.  Neurological:     Mental Status: She is alert and oriented to person, place, and time.     Data Reviewed: Sodium 135, potassium 3.6, creatinine 0.71, white blood count 4.6, hemoglobin 8.6, platelet count 270  Family Communication: Spoke with husband on the phone  Disposition: Status is: Inpatient Remains inpatient appropriate because: Patient declined rehab bed so we will have to work on equipment to go home with home health.  Patient will need wheelchair, 3 in 1 commode and walker.  Patient will need to be taught how to drain the Foley catheter.  Unable to set up home health secondary to insurance will have to be referred to outpatient PT and OT.  Planned Discharge Destination: Home    Time spent: 28 minutes  Author: Charlie Patterson, MD 01/21/2024 1:35 PM  For on call review [email or web address].

## 2024-01-21 NOTE — Progress Notes (Signed)
   01/21/24 1015  Spiritual Encounters  Type of Visit Initial  Care provided to: Patient  Conversation partners present during encounter Nurse;Other (comment) (Physical Therapist)  Reason for visit Routine spiritual support  OnCall Visit Yes   Chaplain visited with patient per the suggestion of the staff.  Patient declined spiritual care but Chaplain let the patient know if she needed care, she could have the Nurse page the [place] phone.   Rev. Rana M. Nicholaus, M.Div. Chaplain Resident [facility].

## 2024-01-21 NOTE — Assessment & Plan Note (Signed)
 Currently using Symbicort, uncertain of effectiveness. Has not quit smoking. Breztri recommended. - Provided Breztri samples, instructed two puffs twice daily. - Continue albuterol  as needed. - Discussed smoking cessation options.

## 2024-01-21 NOTE — Progress Notes (Signed)
 Patient is not able to walk the distance required to go the bathroom, or he/she is unable to safely negotiate stairs required to access the bathroom.  A 3in1 BSC will alleviate this problem

## 2024-01-21 NOTE — Assessment & Plan Note (Signed)
 Seroquel  not effective. Interested in seeing a psychiatrist for better management. - Referred to local psychiatrist for evaluation and management.

## 2024-01-21 NOTE — Assessment & Plan Note (Signed)
 Blood pressure very elevated. She has not obtained lisinopril . Prefers olmesartan  for fewer side effects. - Prescribed olmesartan  20 mg, adjust as needed. - Monitor blood pressure at follow-up next week.

## 2024-01-21 NOTE — Plan of Care (Signed)
  Problem: Clinical Measurements: Goal: Diagnostic test results will improve Outcome: Progressing   Problem: Activity: Goal: Risk for activity intolerance will decrease Outcome: Progressing   Problem: Pain Managment: Goal: General experience of comfort will improve and/or be controlled Outcome: Progressing
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
## 2024-01-21 NOTE — Assessment & Plan Note (Signed)
 Expresses interest in restarting naltrexone  to reduce alcohol cravings. - Prescribed naltrexone  50 mg daily.

## 2024-01-21 NOTE — TOC Progression Note (Signed)
 Transition of Care [facility]) - Progression Note    Patient Details  Name: Nicole [name] MRN: [id number] Date of Birth: Nov 08, 1960  [facility] CM/SW Contact  Marinda Cooks, RN Phone Number: 01/21/2024, 1:31 PM  Clinical Narrative:    This CM spoke with pt regarding bed offer at [facility] in [place] pt declined informing facility is too far, this CM reached out to [facility] after reviewing in the Hub a bed offering pending with [facility] and was informed pt would need to pay 1 month upfront . Pt informed of this info and declined reporting she could not afford it. This CM spoke with pt about dc plan with HH and she agreed & denied having a preference . This CM attempted to coordinate [facility] was unable to secure an agency due to pt's insurance being prepaid Medicaid. Referral sent to [facility] for newly recommended DME per pt desire to dc home. This  CM updated by covering MD pt's dc canceled for today for further medically clearance. TOC wioll cont to follow dc planning /care coordination and update as applicable.    Expected Discharge Plan: Skilled Nursing Facility Barriers to Discharge: Continued Medical Work up  Expected Discharge Plan and Services       Living arrangements for the past 2 months: Single Family Home                                       Social Determinants of Health (SDOH) Interventions SDOH Screenings   Food Insecurity: No Food Insecurity (01/15/2024)  Housing: Low Risk  (01/15/2024)  Transportation Needs: No Transportation Needs (01/15/2024)  Utilities: Not At Risk (01/15/2024)  Alcohol Screen: Low Risk  (10/03/2021)  Depression (PHQ2-9): High Risk (04/17/2022)  Tobacco Use: High Risk (01/21/2024)    Readmission Risk Interventions     No data to display

## 2024-01-22 ENCOUNTER — Other Ambulatory Visit: Payer: Self-pay

## 2024-01-22 DIAGNOSIS — N179 Acute kidney failure, unspecified: Secondary | ICD-10-CM | POA: Diagnosis not present

## 2024-01-22 DIAGNOSIS — R5081 Fever presenting with conditions classified elsewhere: Secondary | ICD-10-CM

## 2024-01-22 DIAGNOSIS — E66813 Obesity, class 3: Secondary | ICD-10-CM | POA: Insufficient documentation

## 2024-01-22 DIAGNOSIS — L89222 Pressure ulcer of left hip, stage 2: Secondary | ICD-10-CM | POA: Diagnosis not present

## 2024-01-22 DIAGNOSIS — R531 Weakness: Secondary | ICD-10-CM | POA: Diagnosis not present

## 2024-01-22 DIAGNOSIS — L249 Irritant contact dermatitis, unspecified cause: Secondary | ICD-10-CM | POA: Diagnosis not present

## 2024-01-22 MED ORDER — AMOXICILLIN-POT CLAVULANATE 875-125 MG PO TABS
1.0000 | ORAL_TABLET | Freq: Two times a day (BID) | ORAL | 0 refills | Status: AC
  Filled 2024-01-22: qty 6, 3d supply, fill #0

## 2024-01-22 MED ORDER — BUPRENORPHINE HCL-NALOXONE HCL 8-2 MG SL FILM
1.0000 | ORAL_FILM | Freq: Two times a day (BID) | SUBLINGUAL | 0 refills | Status: DC
  Filled 2024-01-22: qty 8, 4d supply, fill #0

## 2024-01-22 MED ORDER — FLUCONAZOLE 100 MG PO TABS
100.0000 mg | ORAL_TABLET | Freq: Every day | ORAL | 0 refills | Status: AC
  Filled 2024-01-22: qty 3, 3d supply, fill #0

## 2024-01-22 MED ORDER — GERHARDT'S BUTT CREAM
1.0000 | TOPICAL_CREAM | Freq: Three times a day (TID) | CUTANEOUS | 0 refills | Status: DC
  Filled 2024-01-22: qty 1, 1d supply, fill #0

## 2024-01-22 NOTE — Discharge Summary (Signed)
 Physician Discharge Summary   Patient: Nicole [name] MRN: [id number] DOB: Jul 08, 1960  Admit date:     01/15/2024  Discharge date: 01/22/24  Discharge Physician: Murvin Mana   PCP: Herold Hadassah SQUIBB, MD   Recommendations at discharge:   Follow-up PCP in 1 week. Follow-up with urology as scheduled. Follow-up with pain clinic  Discharge Diagnoses: Principal Problem:   Fever Active Problems:   Drop in hemoglobin   Hyponatremia   Generalized weakness   Intractable nausea and vomiting   Alcohol use disorder   Essential hypertension   GERD without esophagitis   Depression   Chronic pain   COPD (chronic obstructive pulmonary disease) (HCC)   Irritant dermatitis   Symptomatic anemia   Acute posthemorrhagic anemia   Duodenitis   AKI (acute kidney injury) (HCC)   Decubitus ulcer of left hip, stage 2 (HCC)   Class 3 obesity Hypokalemia resolved. Minimal metabolic acidosis. Resolved Problems:   * No resolved hospital problems. *  Hospital Course: 63 y.o. female with medical history significant for anxiety, asthma, Bipolar disorder, COPD, Hepatitis C, hypertension and collagen vascular disease, who presented to the emergency room with acute onset of generalized weakness which has been worsening over the last couple of weeks with associated nausea and diminished appetite.  She has become so weak she cannot get up or out of bed unless her husband left her.  She admits to significant alcohol intake but stated that she has not been drinking over the last 4 days due to anorexia.  She only ate a pack of crackers today.  5 days ago she had a fall with subsequent left arm bruising.  No nausea or vomiting or diarrhea.  She also has gluteal ulceration which has been bleeding.  No fever or chills.  No chest pain or palpitations.  No cough or wheezing or dyspnea.  No chest pain or palpitations.  No dysuria, oliguria or hematuria or flank pain.  She has been having recurrent falls without  injuries.  She drinks about a wine cooler daily.   ED Course: When she came to the ER, BP was 130/75 with heart rate 119 with otherwise normal vital signs.  Labs revealed hyponatremia 124 and hypochloremia of 87 with a blood glucose of 155, BUN of 31 and creatinine 1.18 with albumin 2.6 and total protein 6.3.  AST was 70 and ALT 34.  CBC showed leukocytosis of 14.8 and anemia lower than previous levels with hemoglobin 11.1 hematocrit 30.1 compared to 15.3 and 43.4 on 01/07/2024.  Blood group was O+ with positive antibody screen.  TSH was 1.85. EKG as reviewed by me : None Imaging: Portable chest x-ray showed the following: 1. No radiographic evidence of acute cardiopulmonary disease. 2. Chronic findings in the lungs concerning for potential interstitial lung disease. Outpatient referral to Pulmonology for further clinical evaluation is recommended. Follow-up nonemergent high-resolution chest CT should be considered if clinically appropriate. 3. Aortic atherosclerosis.   The patient was given 1 L bolus of IV normal saline.  She will be admitted to a medical telemetry bed for further evaluation and management.  7/16.  Patient asking about her Suboxone  and complaining of her buttock being painful.  Patient stopped eating for few days.  With her drop in hemoglobin I asked her about any bleeding.  Patient stated she had some bleeding about a week ago.  Husband stated it was less than that and he thinks it was vaginal bleeding.  Patient had a total abdominal hysterectomy previously. 7/17  EGD showing duodenitis and no active bleeding.  Morning's hemoglobin was 8.5 and the afternoon was 9.1 7/18.  Patient complaining of a lot of buttock pain.  Discontinued the naltrexone  part of her Suboxone  to get better pain control. 7/19.  Low-grade fever.  This morning she felt like she was going to freeze and also had sweating.  Will get blood cultures and urine analysis.  Chest x-ray negative.  Will start empiric  Rocephin  and Diflucan .  COVID and respiratory panel negative 7/20.  No further fever.  Continue empiric Rocephin  and Diflucan . 7/21.  Still having quite a bit of pain in the buttock will increase oxycodone  to 10 mg as needed and get rid of buprenorphine . 7/22.  Patient declined rehab bed in [place].  No local bed offers.  Will try to set up equipment for going home with DME wheelchair, 3 in 1 commode and walker.  Assessment and Plan: * Fever I started empiric antibiotics Rocephin  and Diflucan  on 7/19.  COVID test and viral respiratory panel negative.  Blood cultures negative.  This is not a urine infection.  Rocephin  would cover skin organisms and Diflucan  would cover any fungal dermatitis. Will continue Augmentin  and Diflucan  for total course of 7 days.  She no longer has a fever.  Acute on chronic anemia. Suspected upper GI bleed.  EGD showing duodenitis.  Hemoglobin 11.1 on presentation now 8.6 and stable.  (Last admission range between 13 and 15) continue to monitor closely.  GI recommends an outpatient colonoscopy.  B12 level high.  Patient also has lots of bruising and may have lost blood that way also.  Patient will follow-up with Dr. Onita in 4 weeks.  Hyponatremia Last sodium in the normal range  Generalized weakness Physical therapy recommending rehab.  Patient declined the 1 rehab bed offer that she got.   Intractable nausea and vomiting Improved.  Patient tolerating diet.  Protonix  ordered twice daily  Decubitus ulcer of left hip, stage 2 (HCC) Wound care order placed, patient be followed by PCP as outpatient.  AKI (acute kidney injury) (HCC) Creatinine as high as 1.18 and last creatinine down to 0.71.  Irritant dermatitis Buttock.  Cleanse buttocks/sacrum and posterior thighs and inner thighs with Vashe wound cleanser Soila [phone]) do not rinse and allowed to air dry.  Apply Gerhard's Butt cream 3 times a day.  Continue empiric Diflucan  and Rocephin  for now.  Plan  on sending patient home with Foley catheter.  Since we are unable to set up home health I will have to refer to urology to change the Foley in 3 weeks or discontinued in 3 weeks if her buttock is better. Follow-up with urology to remove Foley catheter when wound is better  COPD (chronic obstructive pulmonary disease) (HCC) Resume home treatment.  Chronic pain Resumed Suboxone .  Discussed with pharmacy, verified that patient had ran out Suboxone  she needs going to her pain clinic to get her meds.  I have prescribed 4 day dose to bridge the gap.  Depression Continue Remeron  and Seroquel .  GERD without esophagitis Resume home treatment.  Essential hypertension Patient on hydralazine  and irbesartan .  Alcohol use disorder Patient stated she quit drinking prior to admission.  Severe protein calorie malnutrition Patient has severe atrophy.  Encourage p.o. intake.  Patient had significant anorexia for 3 months, appetite started improving after arrival in the hospital      Consultants: GI Procedures performed: None  Disposition: Home Diet recommendation:  Discharge Diet Orders (From admission, onward)  Start     Ordered   01/22/24 0000  Diet - low sodium heart healthy        01/22/24 1131           Cardiac diet DISCHARGE MEDICATION: Allergies as of 01/22/2024   No Known Allergies      Medication List     STOP taking these medications    doxycycline  100 MG tablet Commonly known as: VIBRA -TABS       TAKE these medications    albuterol  108 (90 Base) MCG/ACT inhaler Commonly known as: Proventil  HFA Inhale 1-2 puffs into the lungs once every 6 (six) hours as needed for wheezing or shortness of breath.   amoxicillin -clavulanate 875-125 MG tablet Commonly known as: AUGMENTIN  Take 1 tablet by mouth 2 (two) times daily for 3 days.   Blood Pressure Kit USE AS DIRECTED TWICE DAILY.   Buprenorphine  HCl-Naloxone  HCl 8-2 MG Film Place 1 Film under the tongue 2  (two) times daily. What changed: how much to take   fluconazole  100 MG tablet Commonly known as: DIFLUCAN  Take 1 tablet (100 mg total) by mouth daily for 3 days. Start taking on: January 23, 2024   Gerhardt's butt cream Crea Apply 1 Application topically 3 (three) times daily.   guaiFENesin  100 MG/5ML liquid Commonly known as: ROBITUSSIN Take 5 mLs (100 mg total) by mouth every 4 (four) hours as needed for cough or to loosen phlegm. Take during the day   hydrALAZINE  50 MG tablet Commonly known as: APRESOLINE  Take 1 tablet (50 mg total) by mouth 3 (three) times daily. APPOINTMENT OVERDUE   hydrOXYzine  25 MG tablet Commonly known as: ATARAX  SMARTSIG:1 Tablet(s) By Mouth Every 12 Hours PRN   mirtazapine  30 MG tablet Commonly known as: REMERON  Take 30 mg by mouth at bedtime.   naltrexone  50 MG tablet Commonly known as: DEPADE Take 1 tablet (50 mg total) by mouth daily.   naproxen  250 MG tablet Commonly known as: NAPROSYN  Take 250 mg by mouth 2 (two) times daily as needed for mild pain or moderate pain.   olmesartan  20 MG tablet Commonly known as: BENICAR  Take 1 tablet (20 mg total) by mouth daily.   omeprazole 40 MG capsule Commonly known as: PRILOSEC Take by mouth 2 (two) times daily.   QUEtiapine  200 MG tablet Commonly known as: SEROQUEL  Take 200 mg by mouth daily.               Durable Medical Equipment  (From admission, onward)           Start     Ordered   01/21/24 1324  For home use only DME 3 n 1  Once       Comments: Unsteady gait   01/21/24 1323   01/21/24 1324  For home use only DME lightweight manual wheelchair with seat cushion  Once       Comments: Patient suffers from severe pain which impairs their ability to perform daily activities like dressing in the home.  A walker will not resolve  issue with performing activities of daily living. A wheelchair will allow patient to safely perform daily activities. Patient is not able to propel  themselves in the home using a standard weight wheelchair due to general weakness. Patient can self propel in the lightweight wheelchair. Length of need 12 months . Accessories: elevating leg rests (ELRs), wheel locks, extensions and anti-tippers.   01/21/24 1324   01/21/24 1305  For home use only DME Walker rolling  Once       Question Answer Comment  Walker: With 5 Inch Wheels   Patient needs a walker to treat with the following condition Unsteady gait when walking      01/21/24 1304              Discharge Care Instructions  (From admission, onward)           Start     Ordered   01/22/24 0000  Discharge wound care:       Comments: Cleanse buttocks/sacrum/posterior thighs, inner thighs and groin with Vashe wound cleanser Soila [phone]) do not rinse and allow to air dry.  Apply Gerhardt's Butt Cream 3 times a day to entire area and prn soiling.  May sprinkle over Gerhardt's with floor stock antifungal (white and green label Microguard) for extra drying effect   01/22/24 1131            Follow-up Information     Herold Hadassah SQUIBB, MD Follow up in 5 day(s).   Specialty: Family Medicine Contact information: [address] [place] [address] [phone]         Twylla Glendia BROCKS, MD Follow up in 3 week(s).   Specialty: Urology Why: either change foley or remove foley (based on how the skin on her buttocks look) Contact information: [address] [place] [address] [phone]         Onita Elspeth Sharper, DO Follow up in 4 week(s).   Specialty: Gastroenterology Why: outpatient colonoscopy Contact information: [address] [facility] [place] [address] [phone]                Discharge Exam: Fredricka Weights   01/14/24 2253  Weight: 46.9 kg   General exam: Appears calm and comfortable, severely malnourished Respiratory system: Decreased breath sounds. Respiratory effort normal. Cardiovascular system: S1 &  S2 heard, RRR. No JVD, murmurs, rubs, gallops or clicks. No pedal edema. Gastrointestinal system: Abdomen is nondistended, soft and nontender. No organomegaly or masses felt. Normal bowel sounds heard. Central nervous system: Alert and oriented. No focal neurological deficits. Extremities: Severe muscle atrophy Skin: Diffuse skin ecchymosis Psychiatry: Judgement and insight appear normal. Mood & affect appropriate.    Condition at discharge: good  The results of significant diagnostics from this hospitalization (including imaging, microbiology, ancillary and laboratory) are listed below for reference.   Imaging Studies: DG Chest Port 1 View Result Date: 01/18/2024 CLINICAL DATA:  Cough EXAM: PORTABLE CHEST 1 VIEW COMPARISON:  January 28, 2022 FINDINGS: No focal consolidation. Increased interstitial markings bilaterally. No pleural effusions. No pneumothorax. Unchanged cardiomediastinal silhouette.  No acute osseous findings. IMPRESSION: Chronic interstitial markings bilaterally.  No acute findings. Electronically Signed   By: Michaeline Blanch M.D.   On: 01/18/2024 13:41    Microbiology: Results for orders placed or performed during the hospital encounter of 01/15/24  MRSA Next Gen by PCR, Nasal     Status: None   Collection Time: 01/15/24  2:37 AM   Specimen: Nasal Mucosa; Nasal Swab  Result Value Ref Range Status   MRSA by PCR Next Gen NOT DETECTED NOT DETECTED Final    Comment: (NOTE) The GeneXpert MRSA Assay (FDA approved for NASAL specimens only), is one component of a comprehensive MRSA colonization surveillance program. It is not intended to diagnose MRSA infection nor to guide or monitor treatment for MRSA infections. Test performance is not FDA approved in patients less than 63 years old. Performed at [facility],  [address] [place], [place] [address]   Culture, blood (Routine X 2) w Reflex to ID Panel     Status: None (Preliminary result)   Collection Time:  01/18/24  4:06 PM   Specimen: BLOOD  Result Value Ref Range Status   Specimen Description BLOOD RIGHT ANTECUBITAL  Final   Special Requests   Final    BOTTLES DRAWN AEROBIC AND ANAEROBIC Blood Culture adequate volume   Culture   Final    NO GROWTH 4 DAYS Performed at [facility], [address], [place], [place] [address]    Report Status PENDING  Incomplete  Culture, blood (Routine X 2) w Reflex to ID Panel     Status: None (Preliminary result)   Collection Time: 01/18/24  4:06 PM   Specimen: BLOOD  Result Value Ref Range Status   Specimen Description BLOOD BLOOD LEFT WRIST  Final   Special Requests   Final    BOTTLES DRAWN AEROBIC AND ANAEROBIC Blood Culture adequate volume   Culture   Final    NO GROWTH 4 DAYS Performed at [facility], [address] [place], [place] [address]    Report Status PENDING  Incomplete  Respiratory (~20 pathogens) panel by PCR     Status: None   Collection Time: 01/18/24  4:14 PM   Specimen: Nasopharyngeal Swab; Respiratory  Result Value Ref Range Status   Adenovirus NOT DETECTED NOT DETECTED Final   Coronavirus 229E NOT DETECTED NOT DETECTED Final    Comment: (NOTE) The Coronavirus on the Respiratory Panel, DOES NOT test for the novel  Coronavirus (2019 nCoV)    Coronavirus HKU1 NOT DETECTED NOT DETECTED Final   Coronavirus NL63 NOT DETECTED NOT DETECTED Final   Coronavirus OC43 NOT DETECTED NOT DETECTED Final   Metapneumovirus NOT DETECTED NOT DETECTED Final   Rhinovirus / Enterovirus NOT DETECTED NOT DETECTED Final   Influenza A NOT DETECTED NOT DETECTED Final   Influenza B NOT DETECTED NOT DETECTED Final   Parainfluenza Virus 1 NOT DETECTED NOT DETECTED Final   Parainfluenza Virus 2 NOT DETECTED NOT DETECTED Final   Parainfluenza Virus 3 NOT DETECTED NOT DETECTED Final   Parainfluenza Virus 4 NOT DETECTED NOT DETECTED Final   Respiratory Syncytial Virus NOT DETECTED NOT DETECTED Final   Bordetella pertussis NOT  DETECTED NOT DETECTED Final   Bordetella Parapertussis NOT DETECTED NOT DETECTED Final   Chlamydophila pneumoniae NOT DETECTED NOT DETECTED Final   Mycoplasma pneumoniae NOT DETECTED NOT DETECTED Final    Comment: Performed at [facility], [address], [place], [place] [address]  SARS Coronavirus 2 by RT PCR (hospital order, performed in [facility] hospital lab) *cepheid single result test* Anterior Nasal Swab     Status: None   Collection Time: 01/18/24  4:14 PM   Specimen: Anterior Nasal Swab  Result Value Ref Range Status   SARS Coronavirus 2 by RT PCR NEGATIVE NEGATIVE Final    Comment: (NOTE) SARS-CoV-2 target nucleic acids are NOT DETECTED.  The SARS-CoV-2 RNA is generally detectable in upper and lower respiratory specimens during the acute phase of infection. The lowest concentration of SARS-CoV-2 viral copies this assay can detect is 250 copies / mL. A negative result does not preclude SARS-CoV-2 infection and should not be used as the sole basis for treatment or other patient management decisions.  A negative result may occur with improper specimen collection / handling, submission of specimen other than nasopharyngeal swab, presence of viral mutation(s) within the  areas targeted by this assay, and inadequate number of viral copies (<250 copies / mL). A negative result must be combined with clinical observations, patient history, and epidemiological information.  Fact Sheet for Patients:   RoadLapTop.co.za  Fact Sheet for Healthcare Providers: http://kim-miller.com/  This test is not yet approved or  cleared by the [place]  FDA and has been authorized for detection and/or diagnosis of SARS-CoV-2 by FDA under an Emergency Use Authorization (EUA).  This EUA will remain in effect (meaning this test can be used) for the duration of the COVID-19 declaration under Section 564(b)(1) of the Act, 21 U.S.C. section  360bbb-3(b)(1), unless the authorization is terminated or revoked sooner.  Performed at [facility], [address] [place], [place] [address]   Urine Culture     Status: None   Collection Time: 01/18/24  6:50 PM   Specimen: Urine, Random  Result Value Ref Range Status   Specimen Description   Final    URINE, RANDOM Performed at [facility], [address] [place], [place] [address]    Special Requests   Final    NONE Reflexed from [id number] Performed at [facility], [address] [place], [place] [address]    Culture   Final    NO GROWTH Performed at [facility], [address] [place], [place] [address]    Report Status 01/20/2024 FINAL  Final    Labs: CBC: Recent Labs  Lab 01/16/24 0525 01/16/24 1417 01/17/24 0501 01/18/24 0548 01/19/24 0530 01/21/24 0518  WBC 9.0  --  7.5  --  4.3 4.6  NEUTROABS  --   --   --   --  2.6  --   HGB 8.5* 9.1* 8.8* 8.7* 8.7* 8.6*  HCT 23.9*  --  24.4*  --  25.0* 25.3*  MCV 99.6  --  99.6  --  101.6* 104.1*  PLT 243  --  282  --  257 270   Basic Metabolic Panel: Recent Labs  Lab 01/16/24 0525 01/17/24 0501 01/18/24 0548 01/19/24 0530 01/21/24 0518  NA 130* 131* 132* 134* 135  K 3.6 3.9 3.8 3.4* 3.6  CL 104 106 107 104 107  CO2 20* 22 21* 20* 20*  GLUCOSE 125* 115* 99 132* 93  BUN 27* 18 16 14 15   CREATININE 1.09* 0.77 0.89 1.11* 0.71  CALCIUM 8.4* 8.8* 8.3* 8.3* 8.2*   Liver Function Tests: No results for input(s): AST, ALT, ALKPHOS, BILITOT, PROT, ALBUMIN in the last 168 hours. CBG: No results for input(s): GLUCAP in the last 168 hours.  Discharge time spent: greater than 30 minutes.  Signed: Murvin Mana, MD [facility] 01/22/2024

## 2024-01-22 NOTE — Plan of Care (Signed)

## 2024-01-22 NOTE — Progress Notes (Signed)
 Physical Therapy Treatment Patient Details Name: Nicole [name] MRN: [id number] DOB: Nov 06, 1960 Today's Date: 01/22/2024   History of Present Illness Pt is a 63 y/o F admitted on 01/15/24 after presenting with c/o acute onset of generalized weakness that has been worsening over the last couple of weeks, associated with nausea & decreased appetite; pt also endorses significant alcohol intake. Pt is being treated for hyponatremia. PMH: anxiety, asthma, bipolar disorder, Hep C, COPD, HTN, collagen vascular disease, empyema    PT Comments  On arrival pt was in good spirits I'm going home today and was willing to do some activity with PT.  She was slow and labored with getting to sitting EOB but did so w/o any direct assist.  Similarly needed some time to get herself together before getting to standing from EOB.  She needed to use UEs heavily to rise and then on the walker, but did maintain balance with relative confidence (does not use walker at baseline).  She managed to ambulate to/from the door but was exhausted with the effort and HR was found to maintain in the 150s (up to >160) with the modest effort - stayed elevated 120-130 range even a few minutes after return to sitting.  Spoke with pt about course of recovery, need for continued HHPT, educated on how to safely manage stairs (she was too fatigued to try this morning after the minimal walk) and answered pt questions.  Pt will benefit from continued PT to address functional limitations.     If plan is discharge home, recommend the following: A little help with walking and/or transfers;A little help with bathing/dressing/bathroom;Assistance with cooking/housework;Assist for transportation;Help with stairs or ramp for entrance   Can travel by private vehicle     Yes  Equipment Recommendations       Recommendations for Other Services       Precautions / Restrictions Precautions Precautions: Fall Recall of Precautions/Restrictions:  Impaired Restrictions Weight Bearing Restrictions Per Provider Order: No     Mobility  Bed Mobility Overal bed mobility: Needs Assistance Bed Mobility: Rolling Rolling: Supervision, Used rails   Supine to sit: Supervision          Transfers Overall transfer level: Modified independent Equipment used: Rolling walker (2 wheels) Transfers: Sit to/from Stand Sit to Stand: Contact guard assist           General transfer comment: Pt better able to initiate and succeed in getting to standing this date, minimal cuing and no direct assist    Ambulation/Gait Ambulation/Gait assistance: Supervision Gait Distance (Feet): 35 Feet Assistive device: Rolling walker (2 wheels)         General Gait Details: Pt showed good effort but fatigued very quickly with modeste in-room ambulation effort.  Her HR was elevated 150-160 bpm and remained elevated for many minutes sitting post activity   Stairs             Wheelchair Mobility     Tilt Bed    Modified Rankin (Stroke Patients Only)       Balance Overall balance assessment: Needs assistance Sitting-balance support: No upper extremity supported Sitting balance-Leahy Scale: Good     Standing balance support: Bilateral upper extremity supported Standing balance-Leahy Scale: Fair Standing balance comment: needed b/l UEs on walker.  A little impulsive and needing cuing but no LOBs during dynamic standing/gait tasks  Communication Communication Communication: No apparent difficulties  Cognition Arousal: Alert Behavior During Therapy: WFL for tasks assessed/performed   PT - Cognitive impairments: No apparent impairments                         Following commands: Intact      Cueing Cueing Techniques: Verbal cues, Gestural cues  Exercises      General Comments General comments (skin integrity, edema, etc.): Pt was able to move relatively well, but fatigued  extremely quickly with minimal in-home distance ambulation      Pertinent Vitals/Pain Pain Assessment Faces Pain Scale: Hurts little more Pain Location: buttocks    Home Living                          Prior Function            PT Goals (current goals can now be found in the care plan section) Progress towards PT goals: Progressing toward goals    Frequency    Min 2X/week      PT Plan      Co-evaluation              AM-PAC PT 6 Clicks Mobility   Outcome Measure  Help needed turning from your back to your side while in a flat bed without using bedrails?: None Help needed moving from lying on your back to sitting on the side of a flat bed without using bedrails?: A Little Help needed moving to and from a bed to a chair (including a wheelchair)?: A Little Help needed standing up from a chair using your arms (e.g., wheelchair or bedside chair)?: A Little Help needed to walk in hospital room?: A Little Help needed climbing 3-5 steps with a railing? : A Lot 6 Click Score: 18    End of Session Equipment Utilized During Treatment: Gait belt Activity Tolerance: Patient tolerated treatment well Patient left: with bed alarm set;with call bell/phone within reach Nurse Communication: Mobility status (HR to 160 with minimal ambulation) PT Visit Diagnosis: Muscle weakness (generalized) (M62.81);Other abnormalities of gait and mobility (R26.89);Pain;Difficulty in walking, not elsewhere classified (R26.2)     Time: 8991-8963 PT Time Calculation (min) (ACUTE ONLY): 28 min  Charges:    $Gait Training: 8-22 mins $Therapeutic Exercise: 8-22 mins PT General Charges $$ ACUTE PT VISIT: 1 Visit                     Carmin JONELLE Deed, DPT 01/22/2024, 12:12 PM

## 2024-01-22 NOTE — TOC Transition Note (Signed)
 Transition of Care [facility]) - Discharge Note   Patient Details  Name: Nicole [name] MRN: [id number] Date of Birth: Nov 23, 1960  Transition of Care [facility]) CM/SW Contact:  Elouise LULLA Capri, RN 01/22/2024, 9:56 AM   Clinical Narrative:     Patient to discharge to home via private transportation. CM awaiting DME per orders sent today to Thomasina Gaudier, phone: [phone] regarding rolling walker, 3 in 1 bedside commode and wheelchair. Noted, ambulatory referral to PT/OT. CM will fax to [facility] fax: [phone].  Final next level of care: Home/Self Care Barriers to Discharge: No Barriers Identified   Patient Goals and CMS Choice   OP Rehab  Discharge Placement   OP Rehab        Discharge Plan and Services Additional resources added to the After Visit Summary for               DME Arranged: Wheelchair manual, Walker rolling, 3-N-1 DME Agency: [employer] Date DME Agency Contacted: 01/22/24 Time DME Agency Contacted: [phone] Representative spoke with at DME Agency: Thomasina   Social Drivers of Health (SDOH) Interventions SDOH Screenings   Food Insecurity: No Food Insecurity (01/15/2024)  Housing: Low Risk  (01/15/2024)  Transportation Needs: No Transportation Needs (01/15/2024)  Utilities: Not At Risk (01/15/2024)  Alcohol Screen: Low Risk  (10/03/2021)  Depression (PHQ2-9): High Risk (04/17/2022)  Tobacco Use: High Risk (01/21/2024)     Readmission Risk Interventions     No data to display

## 2024-01-23 ENCOUNTER — Telehealth: Payer: Self-pay

## 2024-01-23 LAB — CULTURE, BLOOD (ROUTINE X 2)
Culture: NO GROWTH
Culture: NO GROWTH
Special Requests: ADEQUATE
Special Requests: ADEQUATE

## 2024-01-23 NOTE — Transitions of Care (Post Inpatient/ED Visit) (Unsigned)
   01/23/2024  Name: Nicole [name] MRN: [id number] DOB: 11-05-1960  Today's TOC FU Call Status: Today's TOC FU Call Status:: Unsuccessful Call (1st Attempt) Unsuccessful Call (1st Attempt) Date: 01/23/24  Attempted to reach the patient regarding the most recent Inpatient/ED visit.  Follow Up Plan: Additional outreach attempts will be made to reach the patient to complete the Transitions of Care (Post Inpatient/ED visit) call.   Signature Julian Lemmings, LPN [facility] Nurse Health Advisor Direct Dial [phone]

## 2024-01-24 ENCOUNTER — Telehealth: Payer: Self-pay | Admitting: Pediatrics

## 2024-01-24 NOTE — Transitions of Care (Post Inpatient/ED Visit) (Signed)
 01/24/2024  Name: Nicole [name] MRN: [id number] DOB: 03-08-1961  Today's TOC FU Call Status: Today's TOC FU Call Status:: Successful TOC FU Call Completed Unsuccessful Call (1st Attempt) Date: 01/23/24 [facility] FU Call Complete Date: 01/24/24 Patient's Name and Date of Birth confirmed.  Transition Care Management Follow-up Telephone Call Date of Discharge: 01/23/24 Discharge Facility: [facility]) Type of Discharge: Inpatient Admission Primary Inpatient Discharge Diagnosis:: weakness How have you been since you were released from the hospital?: Better Any questions or concerns?: No  Items Reviewed: Did you receive and understand the discharge instructions provided?: Yes Medications obtained,verified, and reconciled?: Yes (Medications Reviewed) Any new allergies since your discharge?: No Dietary orders reviewed?: Yes Do you have support at home?: Yes People in Home [RPT]: spouse  Medications Reviewed Today: Medications Reviewed Today     Reviewed by Emmitt Pan, LPN (Licensed Practical Nurse) on 01/24/24 at 1101  Med List Status: <None>   Medication Order Taking? Sig Documenting Provider Last Dose Status Informant  albuterol  (PROVENTIL  HFA) 108 (90 Base) MCG/ACT inhaler [id number] Yes Inhale 1-2 puffs into the lungs once every 6 (six) hours as needed for wheezing or shortness of breath. Medina-Vargas, Monina C, NP  Active Spouse/Significant Other, Pharmacy Records  amoxicillin -clavulanate (AUGMENTIN ) 875-125 MG tablet [id number] Yes Take 1 tablet by mouth 2 (two) times daily for 3 days. Laurita Pillion, MD  Active   Blood Pressure KIT [id number] Yes USE AS DIRECTED TWICE DAILY. Iloabachie, Chioma E, NP  Active Spouse/Significant Other, Pharmacy Records  Buprenorphine  HCl-Naloxone  HCl 8-2 MG FILM [id number] Yes Place 1 Film under the tongue 2 (two) times daily. Laurita Pillion, MD  Active   fluconazole  (DIFLUCAN ) 100 MG tablet [id number] Yes Take 1 tablet  (100 mg total) by mouth daily for 3 days. Laurita Pillion, MD  Active   guaiFENesin  (ROBITUSSIN) 100 MG/5ML liquid [id number] Yes Take 5 mLs (100 mg total) by mouth every 4 (four) hours as needed for cough or to loosen phlegm. Take during the day Iloabachie, Chioma E, NP  Active Spouse/Significant Other, Pharmacy Records  hydrALAZINE  (APRESOLINE ) 50 MG tablet [id number]  Take 1 tablet (50 mg total) by mouth 3 (three) times daily. APPOINTMENT OVERDUE  Patient not taking: Reported on 01/24/2024   Medina-Vargas, Monina C, NP  Active Spouse/Significant Other, Pharmacy Records  hydrOXYzine  (ATARAX ) 25 MG tablet [id number] Yes SMARTSIG:1 Tablet(s) By Mouth Every 12 Hours PRN [provider]  Active Spouse/Significant Other, Pharmacy Records  mirtazapine  (REMERON ) 30 MG tablet [id number] Yes Take 30 mg by mouth at bedtime. [provider]  Active Spouse/Significant Other, Pharmacy Records  naltrexone  (DEPADE) 50 MG tablet [id number] Yes Take 1 tablet (50 mg total) by mouth daily. Herold Hadassah SQUIBB, MD  Active Spouse/Significant Other, Pharmacy Records  naproxen  (NAPROSYN ) 250 MG tablet [id number] Yes Take 250 mg by mouth 2 (two) times daily as needed for mild pain or moderate pain. [provider]  Active Spouse/Significant Other, Pharmacy Records  Nystatin (GERHARDT'S BUTT CREAM) CREA [id number] Yes Apply 1 Application topically 3 (three) times daily. Laurita Pillion, MD  Active   olmesartan  (BENICAR ) 20 MG tablet [id number] Yes Take 1 tablet (20 mg total) by mouth daily. Herold Hadassah SQUIBB, MD  Active Spouse/Significant Other, Pharmacy Records  omeprazole (PRILOSEC) 40 MG capsule [id number] Yes Take by mouth 2 (two) times daily. [provider]  Active Spouse/Significant Other, Pharmacy Records  QUEtiapine  (SEROQUEL ) 200 MG tablet [id number] Yes Take 200 mg by mouth daily. [provider]  Active Spouse/Significant Other,  Pharmacy Records            Home Care and  Equipment/Supplies: Were Home Health Services Ordered?: NA Any new equipment or medical supplies ordered?: Yes Name of Medical supply agency?: unknown Were you able to get the equipment/medical supplies?: Yes Do you have any questions related to the use of the equipment/supplies?: No  Functional Questionnaire: Do you need assistance with bathing/showering or dressing?: Yes Do you need assistance with meal preparation?: Yes Do you need assistance with eating?: No Do you have difficulty maintaining continence: Yes Do you need assistance with getting out of bed/getting out of a chair/moving?: Yes Do you have difficulty managing or taking your medications?: No  Follow up appointments reviewed: PCP Follow-up appointment confirmed?: Yes (appt already scheduled, patient didn't want to change it) Date of PCP follow-up appointment?: 02/13/24 Follow-up Provider: [facility] Follow-up appointment confirmed?: Yes Date of Specialist follow-up appointment?: 02/12/24 Follow-Up Specialty Provider:: uro Do you need transportation to your follow-up appointment?: No Do you understand care options if your condition(s) worsen?: Yes-patient verbalized understanding    SIGNATURE Julian Lemmings, LPN [facility] Nurse Health Advisor Direct Dial [phone]

## 2024-01-24 NOTE — Telephone Encounter (Signed)
 Called and informed patient. Voiced understanding no questions or concerns at this time

## 2024-01-24 NOTE — Telephone Encounter (Signed)
See note and please advise °

## 2024-01-24 NOTE — Telephone Encounter (Unsigned)
 Copied from CRM #[id number]. Topic: Clinical - Medical Advice >> Jan 24, 2024 11:23 AM Donee H wrote: Reason for CRM: Patient was hospitalized and just was discharged a week ago. Patient states was prescribed antifungal medication at discharged. She is wanting to know do she begin taking her medications that was prescribed by Dr. Herold to her prior to being in the hospital. Please follow up with patient on further instructions on what to do. Callback number [phone]

## 2024-01-27 ENCOUNTER — Ambulatory Visit: Payer: Self-pay

## 2024-01-27 DIAGNOSIS — N179 Acute kidney failure, unspecified: Secondary | ICD-10-CM | POA: Diagnosis not present

## 2024-01-27 DIAGNOSIS — E66813 Obesity, class 3: Secondary | ICD-10-CM | POA: Diagnosis not present

## 2024-01-27 DIAGNOSIS — L89222 Pressure ulcer of left hip, stage 2: Secondary | ICD-10-CM | POA: Diagnosis not present

## 2024-01-27 DIAGNOSIS — R531 Weakness: Secondary | ICD-10-CM | POA: Diagnosis not present

## 2024-01-27 NOTE — Telephone Encounter (Signed)
 FYI Only or Action Required?: FYI only for provider.  Patient was last seen in primary care on 01/07/2024 by Herold Hadassah SQUIBB, MD.  Called Nurse Triage reporting No chief complaint on file..  Symptoms began several days ago.  Interventions attempted: Other: Antibiotic ointment/bandage.  Symptoms are: unchanged.  Triage Disposition: No disposition on file.  Patient/caregiver understands and will follow disposition?:   **CAL notified and patient was scheduled for 7/31 @ 2:40pm**            Copied from CRM #[id number]. Topic: Clinical - Red Word Triage >> Jan 27, 2024  2:01 PM Elle L wrote: Red Word that prompted transfer to Nurse Triage: The patient was discharged from the hospital Thursday. However, she has a bed sore that is causing her severe pain and it is not healing. She is unsure rather to keep it wrapped or leave it open. Reason for Disposition  [1] Looks infected (e.g., spreading redness, pus) AND [2] no fever  Answer Assessment - Initial Assessment Questions 1. APPEARANCE What does the wound look like?      Right leg, by buttock area  2. ONSET: How long ago did the injury occur?      At home she occurred the wound.  3. LOCATION: Where is the injury located?      Right leg, by buttock area   4. SIZE: How large is the cut?      She describes the sore as round, 50 cent piece in size, raw, red  5. BLEEDING: Is it bleeding now? If Yes, ask: Is it difficult to stop?      No   6. PAIN: Is there any pain? If Yes, ask: How bad is the pain? (Scale 0-10; or none, mild, moderate, severe)     8/10  7. MECHANISM: Tell me how it happened.      Non movement from fall x 1 week ago.   Patient was D/C'd from hospital x 3 days ago. CAL notified to get patient seen sooner than 8/15.  Protocols used: Skin Injury-A-AH

## 2024-01-30 ENCOUNTER — Encounter: Payer: Self-pay | Admitting: Pediatrics

## 2024-01-30 ENCOUNTER — Ambulatory Visit (INDEPENDENT_AMBULATORY_CARE_PROVIDER_SITE_OTHER): Admitting: Pediatrics

## 2024-01-30 VITALS — BP 160/116 | HR 124 | Temp 97.6°F | Wt 106.0 lb

## 2024-01-30 DIAGNOSIS — L89222 Pressure ulcer of left hip, stage 2: Secondary | ICD-10-CM

## 2024-01-30 DIAGNOSIS — I10 Essential (primary) hypertension: Secondary | ICD-10-CM

## 2024-01-30 DIAGNOSIS — Z09 Encounter for follow-up examination after completed treatment for conditions other than malignant neoplasm: Secondary | ICD-10-CM

## 2024-01-30 DIAGNOSIS — F1190 Opioid use, unspecified, uncomplicated: Secondary | ICD-10-CM | POA: Diagnosis not present

## 2024-01-30 MED ORDER — BUPRENORPHINE HCL-NALOXONE HCL 8-2 MG SL FILM
1.0000 | ORAL_FILM | Freq: Two times a day (BID) | SUBLINGUAL | 0 refills | Status: DC
Start: 2024-01-30 — End: 2024-02-04

## 2024-01-30 MED ORDER — OXYCODONE HCL ER 10 MG PO T12A
10.0000 mg | EXTENDED_RELEASE_TABLET | Freq: Two times a day (BID) | ORAL | 0 refills | Status: DC
Start: 2024-01-30 — End: 2024-02-04

## 2024-01-30 MED ORDER — DOXYCYCLINE HYCLATE 100 MG PO TABS: 100.0000 mg | ORAL_TABLET | Freq: Two times a day (BID) | ORAL | 0 refills | Status: DC

## 2024-01-30 NOTE — Progress Notes (Unsigned)
   Office Visit  BP (!) 160/116   Pulse (!) 124   Temp 97.6 F (36.4 C) (Oral)   Wt 106 lb (48.1 kg)   SpO2 99%   BMI 19.39 kg/m    Subjective:    Patient ID: Nicole [name], female    DOB: [birth year], 63 y.o.   MRN: [id number]  HPI: Nicole [name] is a 63 y.o. female  Chief Complaint  Patient presents with  . Hospitalization Follow-up    Discussed the use of AI scribe software for clinical note transcription with the patient, who gave verbal consent to proceed.  History of Present Illness     Relevant past medical, surgical, family and social history reviewed and updated as indicated. Interim medical history since our last visit reviewed. Allergies and medications reviewed and updated.  ROS per HPI unless specifically indicated above     Objective:    BP (!) 160/116   Pulse (!) 124   Temp 97.6 F (36.4 C) (Oral)   Wt 106 lb (48.1 kg)   SpO2 99%   BMI 19.39 kg/m   Wt Readings from Last 3 Encounters:  01/30/24 106 lb (48.1 kg)  01/14/24 103 lb 6.3 oz (46.9 kg)  01/07/24 103 lb 6.4 oz (46.9 kg)     Physical Exam      01/30/2024    2:49 PM 04/17/2022    5:16 PM 04/10/2022    2:17 PM 03/06/2022    2:18 PM 12/13/2021    2:03 PM  Depression screen PHQ 2/9  Decreased Interest 1 2 3 2 3   Down, Depressed, Hopeless 1 2 2 2 3   PHQ - 2 Score 2 4 5 4 6   Altered sleeping 1 3 3 2 3   Tired, decreased energy 1 3 2 3 3   Change in appetite 1 3 3  0 3  Feeling bad or failure about yourself  1 1 1  0 2  Trouble concentrating 2 3 3  0 3  Moving slowly or fidgety/restless 2 1 0 0 0  Suicidal thoughts 2 0 0 0 0  PHQ-9 Score 12 18 17 9 20   Difficult doing work/chores Somewhat difficult  Very difficult Somewhat difficult Extremely dIfficult       01/30/2024    2:49 PM 04/17/2022    5:16 PM 04/10/2022    2:16 PM 03/06/2022    2:16 PM  GAD 7 : Generalized Anxiety Score  Nervous, Anxious, on Edge 1 3 3 3   Control/stop worrying 1 3 3 3   Worry too much -  different things 1 3 3 3   Trouble relaxing 1 3 3 3   Restless 1 2 0 0  Easily annoyed or irritable 1 0 0 0  Afraid - awful might happen 1 3 3 3   Total GAD 7 Score 7 17 15 15   Anxiety Difficulty Somewhat difficult Very difficult Very difficult Very difficult       Assessment & Plan:  Assessment & Plan   There are no diagnoses linked to this encounter.   Assessment and Plan Assessment & Plan      Follow up plan: No follow-ups on file.  Hadassah SHAUNNA Nett, MD

## 2024-01-30 NOTE — Patient Instructions (Addendum)
 For your dressing changes, clean it with betadine   EMLA cream - let sit before your wound changes  I sent a short course of oxycodone  10mg  for breakthrough pain  I sent doxycycline  100mg  twice daily

## 2024-01-31 ENCOUNTER — Other Ambulatory Visit: Payer: Self-pay

## 2024-01-31 ENCOUNTER — Other Ambulatory Visit: Payer: Self-pay | Admitting: Pediatrics

## 2024-01-31 ENCOUNTER — Ambulatory Visit: Payer: Self-pay

## 2024-01-31 ENCOUNTER — Encounter: Payer: Self-pay | Admitting: Pediatrics

## 2024-01-31 DIAGNOSIS — L89222 Pressure ulcer of left hip, stage 2: Secondary | ICD-10-CM

## 2024-01-31 LAB — CBC WITH DIFFERENTIAL/PLATELET
Basophils Absolute: 0.1 x10E3/uL (ref 0.0–0.2)
Basos: 1 %
EOS (ABSOLUTE): 0.2 x10E3/uL (ref 0.0–0.4)
Eos: 3 %
Hematocrit: 34.7 % (ref 34.0–46.6)
Hemoglobin: 11.8 g/dL (ref 11.1–15.9)
Immature Grans (Abs): 0 x10E3/uL (ref 0.0–0.1)
Immature Granulocytes: 0 %
Lymphocytes Absolute: 2.3 x10E3/uL (ref 0.7–3.1)
Lymphs: 39 %
MCH: 35.2 pg — ABNORMAL HIGH (ref 26.6–33.0)
MCHC: 34 g/dL (ref 31.5–35.7)
MCV: 104 fL — ABNORMAL HIGH (ref 79–97)
Monocytes Absolute: 0.4 x10E3/uL (ref 0.1–0.9)
Monocytes: 7 %
Neutrophils Absolute: 2.9 x10E3/uL (ref 1.4–7.0)
Neutrophils: 49 %
Platelets: 364 x10E3/uL (ref 150–450)
RBC: 3.35 x10E6/uL — ABNORMAL LOW (ref 3.77–5.28)
RDW: 12.6 % (ref 11.7–15.4)
WBC: 5.9 x10E3/uL (ref 3.4–10.8)

## 2024-01-31 LAB — BASIC METABOLIC PANEL WITH GFR
BUN/Creatinine Ratio: 18 (ref 12–28)
BUN: 16 mg/dL (ref 8–27)
CO2: 16 mmol/L — ABNORMAL LOW (ref 20–29)
Calcium: 9.2 mg/dL (ref 8.7–10.3)
Chloride: 106 mmol/L (ref 96–106)
Creatinine, Ser: 0.91 mg/dL (ref 0.57–1.00)
Glucose: 107 mg/dL — ABNORMAL HIGH (ref 70–99)
Potassium: 4.8 mmol/L (ref 3.5–5.2)
Sodium: 134 mmol/L (ref 134–144)
eGFR: 72 mL/min/1.73 (ref >59)

## 2024-01-31 LAB — SEDIMENTATION RATE: Sed Rate: 33 mm/h (ref 0–40)

## 2024-01-31 LAB — C-REACTIVE PROTEIN: CRP: <1 mg/L (ref 0–10)

## 2024-01-31 MED ORDER — GABAPENTIN 100 MG PO CAPS: 100.0000 mg | ORAL_CAPSULE | Freq: Three times a day (TID) | ORAL | 0 refills | Status: DC

## 2024-01-31 NOTE — Telephone Encounter (Signed)
 Pt states that she called her [facility] insurance and she can get the medication [facility] [phone].  Called CAL and spoke with Nathanel, and she states she will get the medication called into [facility] pharmacy. Instructed pt to check with pharmacy in a hour.

## 2024-01-31 NOTE — Telephone Encounter (Signed)
 FYI Only or Action Required?: Action required by provider: update on patient condition.  Patient was last seen in primary care on 01/30/2024 by Herold Hadassah SQUIBB, MD.  Called Nurse Triage reporting urine catheter problem.  Symptoms began today.  Interventions attempted: Nothing.  Symptoms are: accidental removal of urinary catheter, burning and pressure last night that resolved when catheter removed, patient states she is producing yellow urine since catheter removal completely resolved.  Triage Disposition: Call PCP Now (overriding See HCP Within 4 Hours (Or PCP Triage))  Patient/caregiver understands and will follow disposition?: Yes                    Copied from CRM [id number]. Topic: Clinical - Red Word Triage >> Jan 31, 2024  8:14 AM Suzen RAMAN wrote: Red Word that prompted transfer to Nurse Triage: catheter pain and burning. Reason for Disposition  Catheter was accidentally pulled-out  Answer Assessment - Initial Assessment Questions Patient states this morning she accidentally got her urinary catheter hooked around something and it pulled out (self-removed). She states she has been urinating on her own since it removed. Denies any blood in her urine. Patient states she does not want to come in and have a new urinary catheter placed. Patient also would like to address with PCP that the pharmacy is blocking her from getting her oxycodone  filled that was sent yesterday.  1. SYMPTOMS: What symptoms are you concerned about?     Burning and pressure prior to catheter pulling out this morning. She states the symptoms have resolved since the catheter is out.  2. ONSET:  When did the symptoms start?     Last night.  3. FEVER: Do you have a fever? If Yes, ask: What is the temperature, how was it measured, and when did it start?     No.  4. ABDOMEN PAIN: Is there any abdomen pain? (Scale 1-10; or mild, moderate, severe)     No.  5. URINE COLOR: What color is  the urine?  Is there blood present in the urine? (e.g., clear, yellow, cloudy, tea-colored, blood streaks, bright red)     Not real dark, yellow and no blood.  6. URINE AMOUNT: When did you last empty the urine from the collection bag? How much urine was in the bag at that time? How much urine is in the collection bag now?     N/A due to catheter is no longer inserted.  7. INSERTION: How long have you (they) had the catheter?     She states over a week, placed in the hospital.  8. OTHER SYMPTOMS: Are there any other symptoms? (e.g., abdomen swelling, back pain, bladder spasms, constipation, foul smelling urine, leaking of urine)      She states her only additional symptoms are buttocks pain and the bruises on left arm from her fall 2 weeks ago.  9. MEDICINES: Are you taking any medicines to treat urinary problems? (e.g., antibiotics for a urinary tract infection, medicines to treat bladder spasms)      No.  10. PREGNANCY: Is there any chance you are pregnant? When was your last menstrual period?       N/A.  Protocols used: Urinary Catheter (e.g., Foley) Symptoms and Questions-A-AH

## 2024-01-31 NOTE — Telephone Encounter (Signed)
 Routing to provider. Can patients medication be resent to [facility] please?

## 2024-01-31 NOTE — Telephone Encounter (Signed)
 Spoke with [facility] patient does have suboxone  but was unable to get the prn oxy for breakthrough. Unable to send as she has a lock in place from prior PCP. Spoke with pharmacist to send gabapentin  100mg  TID 15 tabs to carry through weekend.  Nicole SHAUNNA Nett, MD

## 2024-01-31 NOTE — Telephone Encounter (Signed)
 Copied from CRM [id number]. Topic: Clinical - Prescription Issue >> Jan 31, 2024 11:54 AM Edsel HERO wrote: Nicole [name] with [facility] called and stated that patient is unable to get her oxyCODONE  (OXYCONTIN ) 10 mg 12 hr tablet filled at [facility] and it will need to be transferred to [facility]. [facility]  Phone: [phone] Fax: [phone]

## 2024-01-31 NOTE — Telephone Encounter (Signed)
 Contacted [facility] to get more information. Pharmacist stated that the patient is in what's called a lock in program with her insurance meaning a certain provider can be the only one to write the medication and/or a certain pharmacy must be used to fill the medication. Patient must contact her insurance to discuss this which previous message states that the patient was instructed to do.

## 2024-01-31 NOTE — Telephone Encounter (Signed)
 FYI Only or Action Required?: Action required by provider: medication refill request.  Patient was last seen in primary care on 01/30/2024 by Herold Hadassah SQUIBB, MD.  Called Nurse Triage reporting Medication Refill.  Symptoms began today.  Interventions attempted: Nothing.  Symptoms are: gradually worsening.  Patient; is requesting the medication prescribed by Dr. Herold on yesterday be sent to another Pharmacy as soon as possible, due to [facility] refusing to fill the medication.

## 2024-01-31 NOTE — Telephone Encounter (Signed)
 Requested medications are due for refill today.  Pt need rx sent to a different pharmacy  Requested medications are on the active medications list.  yes  Last refill. 01/30/2024 - not filled  Future visit scheduled.   yes  Notes to clinic.  Please send rx to [facility] pharmacy at [facility]   Requested Prescriptions  Pending Prescriptions Disp Refills   oxyCODONE  (OXYCONTIN ) 10 mg 12 hr tablet 14 tablet 0    Sig: Take 1 tablet (10 mg total) by mouth every 12 (twelve) hours.     Not Delegated - Analgesics:  Opioid Agonists Failed - 01/31/2024  4:14 PM      Failed - This refill cannot be delegated      Failed - Urine Drug Screen completed in last 360 days      Passed - Valid encounter within last 3 months    Recent Outpatient Visits           Yesterday Decubitus ulcer of left hip, stage 2 (HCC)   [place] Hadassah SQUIBB, MD   3 weeks ago Primary hypertension   [place] [facility] Hadassah SQUIBB, MD       Future Appointments             In 1 week Vaillancourt, Samantha, PA-C [facility]   In 1 week Maurine Lukes, [facility]

## 2024-01-31 NOTE — Telephone Encounter (Signed)
 Copied from CRM [id number]. Topic: Clinical - Medication Refill >> Jan 31, 2024 12:08 PM Delon HERO wrote: Medication: oxyCODONE  (OXYCONTIN ) 10 mg 12 hr tablet [[id number]] Patient requesting if the pharmacy to be changed to below  Has the patient contacted their pharmacy? Yes (Agent: If no, request that the patient contact the pharmacy for the refill. If patient does not wish to contact the pharmacy document the reason why and proceed with request.) (Agent: If yes, when and what did the pharmacy advise?)  This is the patient's preferred pharmacy:   [facility] [address] [place] [address] Phone: [phone] Fax: [phone]   Is this the correct pharmacy for this prescription? Yes If no, delete pharmacy and type the correct one.   Has the prescription been filled recently? Yes  Is the patient out of the medication? Yes  Has the patient been seen for an appointment in the last year OR does the patient have an upcoming appointment? Yes  Can we respond through MyChart? Yes  Agent: Please be advised that Rx refills may take up to 3 business days. We ask that you follow-up with your pharmacy.

## 2024-01-31 NOTE — Telephone Encounter (Signed)
 Patient calling back with concerns about being able to get pain medication from pharmacy. Patient reports pharmacy states that she has a block on her account. Patient instructed to call insurance company to get more information and assistance

## 2024-02-03 ENCOUNTER — Ambulatory Visit: Payer: Self-pay | Admitting: Pediatrics

## 2024-02-03 ENCOUNTER — Ambulatory Visit: Payer: Self-pay

## 2024-02-03 ENCOUNTER — Ambulatory Visit: Admitting: Physician Assistant

## 2024-02-03 NOTE — Telephone Encounter (Signed)
 FYI Only or Action Required?: Action required by provider: update on patient condition and requesting medication for pain.  Patient was last seen in primary care on 01/30/2024 by Nicole Hadassah SQUIBB, MD.  Called Nurse Triage reporting Leg Pain.  Symptoms began the whole time per patient.  Interventions attempted: Prescription medications: Gabapentin  and Rest, hydration, or home remedies.  Symptoms are: rapidly worsening.  Triage Disposition: See HCP Within 4 Hours (Or PCP Triage)  Patient/caregiver understands and will follow disposition?:              Copied from CRM [id number]. Topic: Clinical - Medical Advice >> Feb 03, 2024 12:56 PM Myrick T wrote: Reason for CRM: patient called stated she was taking gabapentin  (NEURONTIN ) 100 MG capsule for the pain she feels on her top right leg. Patient is requesting another pain med that pharmacy will not block that will help her get some relief. Reason for Disposition  [1] MODERATE weakness (e.g., interferes with work, school, normal activities) AND [2] cause unknown  (Exceptions: Weakness from acute minor illness or poor fluid intake; weakness is chronic and not worse.)  Answer Assessment - Initial Assessment Questions Patient states that she just feels so weak and is in a lot of pain with this area on the back of her right leg under her right butt cheek She is advised that with the feeling weak/no energy to a point it is affecting her daily activity, along with severe pain it is recommended that she is seen and evaluated in the next 4 hours Patient states that she has an appointment with her PCP this Thursday 02/06/2024 and she can wait until then to see her PCP She just wanted to see if her PCP Dr Hadassah Nicole could send in something for pain that won't be blocked, other than Gabapentin , which is not helping at all per patient She is advised to call us  if anything changes and also to go to the Emergency Room if anything worsens Patient  verbalized understanding    1. DESCRIPTION: Describe how you are feeling.     Weak and no energy 2. SEVERITY: How bad is it?  Can you stand and walk?     Can walk to the kitchen and back but then she has to sit down and rest 3. ONSET: When did these symptoms begin? (e.g., hours, days, weeks, months)     The whole time--I didn't get up any when I was in the hospital 4. CAUSE: What do you think is causing the weakness or fatigue? (e.g., not drinking enough fluids, medical problem, trouble sleeping)     Unsure 5. NEW MEDICINES:  Have you started on any new medicines recently? (e.g., opioid pain medicines, benzodiazepines, muscle relaxants, antidepressants, antihistamines, neuroleptics, beta blockers)     Gabapentin , Doxycycline ,  6. OTHER SYMPTOMS: Do you have any other symptoms? (e.g., chest pain, fever, cough, SOB, vomiting, diarrhea, bleeding, other areas of pain)     Pain under right butt cheek, patient states she looks pale  Answer Assessment - Initial Assessment Questions Patient states that the Gabapentin  is not helping her pain at all Patient states she feels weak and no energy She is eating and drinking well though    1. ONSET: When did the pain start?      Almost three weeks 2. LOCATION: Where is the pain located?      Under right butt cheek per patient 3. PAIN: How bad is the pain?    (Scale 1-10; or mild, moderate,  severe)     Patient states she cannot put on pants due to it hurting so bad 4. WORK OR EXERCISE: Has there been any recent work or exercise that involved this part of the body?      Daily use 5. CAUSE: What do you think is causing the leg pain?     Area recently assessed 6. OTHER SYMPTOMS: Do you have any other symptoms? (e.g., chest pain, back pain, breathing difficulty, swelling, rash, fever, numbness, weakness)     Weak and no energy--patient states her husband bought her some B12 to try to help  Protocols used: Leg Pain-A-AH,  Weakness (Generalized) and Fatigue-A-AH

## 2024-02-04 ENCOUNTER — Other Ambulatory Visit: Payer: Self-pay | Admitting: Pediatrics

## 2024-02-04 ENCOUNTER — Other Ambulatory Visit: Payer: Self-pay

## 2024-02-04 ENCOUNTER — Telehealth: Payer: Self-pay | Admitting: Pediatrics

## 2024-02-04 DIAGNOSIS — L89222 Pressure ulcer of left hip, stage 2: Secondary | ICD-10-CM

## 2024-02-04 DIAGNOSIS — F1190 Opioid use, unspecified, uncomplicated: Secondary | ICD-10-CM

## 2024-02-04 MED ORDER — OXYCODONE HCL 5 MG PO TABS
5.0000 mg | ORAL_TABLET | Freq: Four times a day (QID) | ORAL | 0 refills | Status: DC | PRN
  Filled 2024-02-04: qty 15, 4d supply, fill #0

## 2024-02-04 MED ORDER — BUPRENORPHINE HCL-NALOXONE HCL 8-2 MG SL FILM
1.0000 | ORAL_FILM | Freq: Two times a day (BID) | SUBLINGUAL | 0 refills | Status: DC
  Filled 2024-02-04: qty 14, 7d supply, fill #0

## 2024-02-04 NOTE — Telephone Encounter (Signed)
 Called to speak with patient to figure out exactly what she was needing, she is requesting a refill for Buprenorphine  [facility] [place] also states that the gabapentin  is not helping for pain and she is requesting something stronger        Copied from CRM #[id number]. Topic: Clinical - Medication Question >> Feb 04, 2024 10:17 AM Shereese L wrote: Reason for CRM: patient called in yesterday and is  waiting on a call back from the nurse in reference to pain meds  Buprenorphine  HCl-Naloxone  HCl 8-2 MG FILM  She stated that she was suppose to receive a call back yesterday before 5pm

## 2024-02-04 NOTE — Telephone Encounter (Unsigned)
 Copied from CRM [id number]. Topic: Clinical - Medication Refill >> Feb 04, 2024 10:08 AM Shereese L wrote: Medication: Buprenorphine  HCl-Naloxone  HCl 8-2 MG FILM  Has the patient contacted their pharmacy? Yes (Agent: If no, request that the patient contact the pharmacy for the refill. If patient does not wish to contact the pharmacy document the reason why and proceed with request.) (Agent: If yes, when and what did the pharmacy advise?)  This is the patient's preferred pharmacy:  [id number] [place] [place] - [address] [place] [address] Phone: [phone] Fax: [phone]    Is this the correct pharmacy for this prescription? Yes If no, delete pharmacy and type the correct one.   Has the prescription been filled recently? Yes  Is the patient out of the medication? Yes  Has the patient been seen for an appointment in the last year OR does the patient have an upcoming appointment? Yes  Can we respond through MyChart? Yes  Agent: Please be advised that Rx refills may take up to 3 business days. We ask that you follow-up with your pharmacy.

## 2024-02-04 NOTE — Telephone Encounter (Signed)
 Copied from CRM #[id number]. Topic: Clinical - Medication Question >> Feb 04, 2024 10:17 AM Shereese L wrote: Reason for CRM: patient called in yesterday and is  waiting on a call back from the nurse in reference to pain meds  Buprenorphine  HCl-Naloxone  HCl 8-2 MG FILM  She stated that she was suppose to receive a call back yesterday before 5pm

## 2024-02-04 NOTE — Telephone Encounter (Signed)
Pt called to check status of request, please advise

## 2024-02-04 NOTE — Telephone Encounter (Signed)
 Duplicate encounter

## 2024-02-04 NOTE — Telephone Encounter (Signed)
 Called and informed patient on what provider stated below pt voiced understanding she informed me she wants to continue going to the urgent care clinic in [place] for her suboxone .  Also states she has a wound appointment 8/28 so she would not be going to the emergency room.

## 2024-02-04 NOTE — Progress Notes (Signed)
 Sent 7 day suboxone  refill, unable to go to suboxone  clinic due to ulcer Sent short course of oxy IR 5mg  given ulcer. Stop gabapentin .  Nicole SHAUNNA Nett, MD

## 2024-02-06 ENCOUNTER — Ambulatory Visit: Admitting: Pediatrics

## 2024-02-06 ENCOUNTER — Other Ambulatory Visit: Payer: Self-pay

## 2024-02-06 ENCOUNTER — Other Ambulatory Visit: Payer: Self-pay | Admitting: Pediatrics

## 2024-02-06 VITALS — BP 177/107 | HR 105 | Temp 97.6°F

## 2024-02-06 DIAGNOSIS — L89222 Pressure ulcer of left hip, stage 2: Secondary | ICD-10-CM

## 2024-02-06 DIAGNOSIS — F1190 Opioid use, unspecified, uncomplicated: Secondary | ICD-10-CM

## 2024-02-06 DIAGNOSIS — I10 Essential (primary) hypertension: Secondary | ICD-10-CM

## 2024-02-06 DIAGNOSIS — G4700 Insomnia, unspecified: Secondary | ICD-10-CM | POA: Diagnosis not present

## 2024-02-06 MED ORDER — BUPRENORPHINE HCL-NALOXONE HCL 8-2 MG SL FILM
1.0000 | ORAL_FILM | Freq: Two times a day (BID) | SUBLINGUAL | 0 refills | Status: DC
  Filled 2024-02-06 – 2024-02-10 (×3): qty 42, 21d supply, fill #0

## 2024-02-06 MED ORDER — LIDOCAINE-PRILOCAINE 2.5-2.5 % EX CREA
1.0000 | TOPICAL_CREAM | CUTANEOUS | 0 refills | Status: DC | PRN
  Filled 2024-02-06: qty 30, 30d supply, fill #0

## 2024-02-06 MED ORDER — OXYCODONE HCL 10 MG PO TABS
10.0000 mg | ORAL_TABLET | Freq: Four times a day (QID) | ORAL | 0 refills | Status: DC | PRN
  Filled 2024-02-06: qty 15, 4d supply, fill #0

## 2024-02-06 MED ORDER — QUETIAPINE FUMARATE 200 MG PO TABS
200.0000 mg | ORAL_TABLET | Freq: Every day | ORAL | 2 refills | Status: DC
Start: 2024-02-06 — End: 2024-04-10
  Filled 2024-02-06: qty 30, 30d supply, fill #0
  Filled 2024-02-26 – 2024-03-03 (×2): qty 30, 30d supply, fill #1
  Filled 2024-03-20: qty 30, 30d supply, fill #2

## 2024-02-06 NOTE — Progress Notes (Signed)
 Office Visit  BP (!) 177/107   Pulse (!) 105   Temp 97.6 F (36.4 C) (Oral)   SpO2 98%    Subjective:    Patient ID: Nicole [name], female    DOB: Sep 15, 1960, 63 y.o.   MRN: [id number]  HPI: Nicole [name] is a 63 y.o. female  Chief Complaint  Patient presents with   Follow-up    Discussed the use of AI scribe software for clinical note transcription with the patient, who gave verbal consent to proceed.  History of Present Illness   Nicole [name] is a 63 year old female who presents with inadequate pain control and sleep disturbances.  She is experiencing severe pain with minimal relief from oxycodone , which was prescribed for five days. The patient describes the wound as raw, leaking, and causing her pajamas to stick to it. She has an appointment at the wound clinic on the 28th.  She had an incident where her catheter was accidentally dislodged without causing pain or bleeding, and she is urinating normally. She uses baby powder to manage moisture.  She is taking doxycycline  twice daily but feels it is not significantly improving her condition. She describes the wound as deep and painful, with the top being particularly raw.  She is experiencing severe insomnia and has been without her Seroquel , previously prescribed at 200 mg. She describes the insomnia as 'it will drive you insane' and is seeking to resume it for better sleep.  Her blood pressure is elevated, with a systolic reading of 186. She is on hydralazine  and olmesartan , the latter at 20 mg. She denies headaches but reports neck pain.  Her son, who lacks a driver's license, works and needs to be at work by 9 AM, affecting her transportation options.      Relevant past medical, surgical, family and social history reviewed and updated as indicated. Interim medical history since our last visit reviewed. Allergies and medications reviewed and updated.  ROS per HPI unless specifically indicated  above     Objective:    BP (!) 177/107   Pulse (!) 105   Temp 97.6 F (36.4 C) (Oral)   SpO2 98%   Wt Readings from Last 3 Encounters:  02/13/24 108 lb (49 kg)  01/30/24 106 lb (48.1 kg)  01/14/24 103 lb 6.3 oz (46.9 kg)     Physical Exam Constitutional:      Appearance: Normal appearance.     Comments: Uncomfortable appearing  Pulmonary:     Effort: Pulmonary effort is normal.  Musculoskeletal:        General: Normal range of motion.  Skin:    Comments: Ulcer as seen below in media tab (improved- sensitive image)  Neurological:     General: No focal deficit present.     Mental Status: She is alert. Mental status is at baseline.  Psychiatric:        Mood and Affect: Mood normal.        Behavior: Behavior normal.        Thought Content: Thought content normal.          02/06/2024    2:54 PM 01/30/2024    2:49 PM 04/17/2022    5:16 PM 04/10/2022    2:17 PM 03/06/2022    2:18 PM  Depression screen PHQ 2/9  Decreased Interest 2 1 2 3 2   Down, Depressed, Hopeless 2 1 2 2 2   PHQ - 2 Score 4 2 4 5 4   Altered  sleeping 0 1 3 3 2   Tired, decreased energy 3 1 3 2 3   Change in appetite 2 1 3 3  0  Feeling bad or failure about yourself  2 1 1 1  0  Trouble concentrating 2 2 3 3  0  Moving slowly or fidgety/restless 2 2 1  0 0  Suicidal thoughts 1 2 0 0 0  PHQ-9 Score 16 12 18 17 9   Difficult doing work/chores Somewhat difficult Somewhat difficult  Very difficult Somewhat difficult       02/06/2024    2:54 PM 01/30/2024    2:49 PM 04/17/2022    5:16 PM 04/10/2022    2:16 PM  GAD 7 : Generalized Anxiety Score  Nervous, Anxious, on Edge 2 1 3 3   Control/stop worrying 2 1 3 3   Worry too much - different things 2 1 3 3   Trouble relaxing 2 1 3 3   Restless 2 1 2  0  Easily annoyed or irritable 2 1 0 0  Afraid - awful might happen 2 1 3 3   Total GAD 7 Score 14 7 17 15   Anxiety Difficulty Very difficult Somewhat difficult Very difficult Very difficult       Assessment &  Plan:  Assessment & Plan   Decubitus ulcer of left hip, stage 2 (HCC) Chronic ulcer healing but remains painful. Current pain management insufficient. Doxycycline  may be beneficial. - Increase oxycodone  to 10 mg for pain management. - Continue doxycycline  twice daily for one more week. - Apply numbing cream or petroleum jelly as a barrier and cover the ulcer. - Keep pressure off the ulcer. - Provide petroleum jelly for ulcer care. -     Lidocaine -Prilocaine ; Apply 1 Application topically as needed.  Dispense: 30 g; Refill: 0  Insomnia, unspecified type Insomnia worsened by Seroquel  discontinuation, severely affecting sleep and quality of life. - Prescribe [facility]quel  [facility] -     QUEtiapine  Fumarate; Take 1 tablet (200 mg total) by mouth at bedtime.  Dispense: 30 tablet; Refill: 2  Primary hypertension Hypertension with recent systolic reading of 186. Medication adjustment needed. - Increase olmesartan  to 40 mg. - Ensure lisinopril  is not taken with olmesartan .  Opioid use disorder Opioid use disorder managed with Suboxone . Unable to travel for management. - Take over Suboxone  prescription management. - Continue current Suboxone  dose for one month until able to re-establish -     Buprenorphine  HCl-Naloxone  HCl; Place 1 Film under the tongue 2 (two) times daily for 21 days.  Dispense: 42 Film; Refill: 0     Follow up plan: Return in about 1 week (around 02/13/2024).  Hadassah SHAUNNA Nett, MD

## 2024-02-06 NOTE — Patient Instructions (Signed)
 Plan: I sent seroquel  200mg  to help with sleep  I'll take over the suboxone  prescription until we can get you back to [place]  I will increase the dose of the oxycodone  to 10mg .   We will follow up in 1 week

## 2024-02-10 ENCOUNTER — Other Ambulatory Visit: Payer: Self-pay

## 2024-02-10 ENCOUNTER — Other Ambulatory Visit: Payer: Self-pay | Admitting: Pediatrics

## 2024-02-10 DIAGNOSIS — L89222 Pressure ulcer of left hip, stage 2: Secondary | ICD-10-CM

## 2024-02-11 ENCOUNTER — Other Ambulatory Visit: Payer: Self-pay

## 2024-02-11 ENCOUNTER — Telehealth: Payer: Self-pay | Admitting: Pediatrics

## 2024-02-11 DIAGNOSIS — R531 Weakness: Secondary | ICD-10-CM | POA: Diagnosis not present

## 2024-02-11 DIAGNOSIS — E66813 Obesity, class 3: Secondary | ICD-10-CM | POA: Diagnosis not present

## 2024-02-11 DIAGNOSIS — N179 Acute kidney failure, unspecified: Secondary | ICD-10-CM | POA: Diagnosis not present

## 2024-02-11 DIAGNOSIS — Z419 Encounter for procedure for purposes other than remedying health state, unspecified: Secondary | ICD-10-CM | POA: Diagnosis not present

## 2024-02-11 DIAGNOSIS — L89222 Pressure ulcer of left hip, stage 2: Secondary | ICD-10-CM | POA: Diagnosis not present

## 2024-02-11 NOTE — Telephone Encounter (Unsigned)
 Copied from CRM #[id number]. Topic: Clinical - Medication Refill >> Feb 11, 2024  9:26 AM Cynthia K wrote: Medication: Oxycodone  HCl 10 MG TABS   Has the patient contacted their pharmacy? Yes (Agent: If no, request that the patient contact the pharmacy for the refill. If patient does not wish to contact the pharmacy document the reason why and proceed with request.) (Agent: If yes, when and what did the pharmacy advise?) Pharmacy needs order to refill  This is the patient's preferred pharmacy:   [facility] [address] [place] [address] Phone: [phone] Fax: [phone]  Is this the correct pharmacy for this prescription? Yes If no, delete pharmacy and type the correct one.   Has the prescription been filled recently? No  Is the patient out of the medication? Yes  Has the patient been seen for an appointment in the last year OR does the patient have an upcoming appointment? Yes  Can we respond through MyChart? No  Agent: Please be advised that Rx refills may take up to 3 business days. We ask that you follow-up with your pharmacy.

## 2024-02-12 ENCOUNTER — Telehealth: Payer: Self-pay

## 2024-02-12 ENCOUNTER — Ambulatory Visit: Admitting: Physician Assistant

## 2024-02-12 NOTE — Telephone Encounter (Signed)
 noted

## 2024-02-12 NOTE — Telephone Encounter (Signed)
 Ok for E2C2 to review.  Please transfer call to CAL.

## 2024-02-13 ENCOUNTER — Other Ambulatory Visit: Payer: Self-pay

## 2024-02-13 ENCOUNTER — Ambulatory Visit: Admitting: Pediatrics

## 2024-02-13 VITALS — BP 148/103 | HR 117 | Temp 97.8°F | Wt 108.0 lb

## 2024-02-13 DIAGNOSIS — F1090 Alcohol use, unspecified, uncomplicated: Secondary | ICD-10-CM

## 2024-02-13 DIAGNOSIS — Z8659 Personal history of other mental and behavioral disorders: Secondary | ICD-10-CM | POA: Diagnosis not present

## 2024-02-13 DIAGNOSIS — F1191 Opioid use, unspecified, in remission: Secondary | ICD-10-CM | POA: Diagnosis not present

## 2024-02-13 DIAGNOSIS — I10 Essential (primary) hypertension: Secondary | ICD-10-CM

## 2024-02-13 DIAGNOSIS — J449 Chronic obstructive pulmonary disease, unspecified: Secondary | ICD-10-CM | POA: Diagnosis not present

## 2024-02-13 DIAGNOSIS — L89222 Pressure ulcer of left hip, stage 2: Secondary | ICD-10-CM

## 2024-02-13 DIAGNOSIS — L299 Pruritus, unspecified: Secondary | ICD-10-CM | POA: Diagnosis not present

## 2024-02-13 MED ORDER — NALTREXONE HCL 50 MG PO TABS
50.0000 mg | ORAL_TABLET | Freq: Every day | ORAL | 3 refills | Status: DC
  Filled 2024-02-13: qty 90, 90d supply, fill #0

## 2024-02-13 MED ORDER — OXYCODONE HCL 10 MG PO TABS
10.0000 mg | ORAL_TABLET | Freq: Four times a day (QID) | ORAL | 0 refills | Status: AC | PRN
Start: 2024-02-13 — End: 2024-02-21
  Filled 2024-02-13: qty 15, 4d supply, fill #0

## 2024-02-13 MED ORDER — BREZTRI AEROSPHERE 160-9-4.8 MCG/ACT IN AERO
2.0000 | INHALATION_SPRAY | Freq: Two times a day (BID) | RESPIRATORY_TRACT | 11 refills | Status: DC
  Filled 2024-02-13: qty 10.7, 30d supply, fill #0

## 2024-02-13 MED ORDER — AMLODIPINE BESYLATE 5 MG PO TABS
5.0000 mg | ORAL_TABLET | Freq: Every day | ORAL | 2 refills | Status: DC
  Filled 2024-02-13: qty 30, 30d supply, fill #0

## 2024-02-13 MED ORDER — OLMESARTAN MEDOXOMIL 40 MG PO TABS
40.0000 mg | ORAL_TABLET | Freq: Every day | ORAL | 3 refills | Status: DC
  Filled 2024-02-13 – 2024-02-14 (×3): qty 90, 90d supply, fill #0

## 2024-02-13 MED ORDER — OXYCODONE HCL 10 MG PO TABS: 10.0000 mg | ORAL_TABLET | Freq: Four times a day (QID) | ORAL | 0 refills | Status: DC | PRN

## 2024-02-13 MED ORDER — HYDROXYZINE HCL 25 MG PO TABS
25.0000 mg | ORAL_TABLET | Freq: Three times a day (TID) | ORAL | 2 refills | Status: DC | PRN
  Filled 2024-02-13 – 2024-02-20 (×2): qty 30, 10d supply, fill #0
  Filled 2024-03-20: qty 30, 10d supply, fill #1

## 2024-02-13 MED ORDER — HYDROXYZINE HCL 25 MG PO TABS: 25.0000 mg | ORAL_TABLET | Freq: Three times a day (TID) | ORAL | 2 refills | Status: DC | PRN

## 2024-02-13 MED ORDER — AMLODIPINE BESYLATE 5 MG PO TABS: 5.0000 mg | ORAL_TABLET | Freq: Every day | ORAL | 2 refills | Status: DC

## 2024-02-13 NOTE — Patient Instructions (Addendum)
 For blood pressure: take 2 olmesartan  20mg  for total 40mg  (I will send the 40mg  pills), and add amlodipine  5mg  daily. Eventually maybe we can combine these to one pill once we find the right dose for you  I sent more naltrexone  for alcohol cravings, keep up the good work!  See below cream for itchiness.

## 2024-02-13 NOTE — Telephone Encounter (Signed)
 Requested medication (s) are due for refill today: yes  Requested medication (s) are on the active medication list: yes  Last refill:  02/06/24 #15 tabs (end date 02/14/24)  Future visit scheduled: yes  Notes to clinic:  med not delegated to NT to RF   Requested Prescriptions  Pending Prescriptions Disp Refills   Oxycodone  HCl 10 MG TABS [Pharmacy Med Name: Oxycodone  HCl 10 MG Tab] 15 tablet 0    Sig: Take 1 tablet (10 mg total) by mouth every 6 (six) hours as needed for up to 8 days for breakthrough pain.     Not Delegated - Analgesics:  Opioid Agonists Failed - 02/13/2024  1:06 PM      Failed - This refill cannot be delegated      Failed - Urine Drug Screen completed in last 360 days      Passed - Valid encounter within last 3 months    Recent Outpatient Visits           1 week ago Decubitus ulcer of left hip, stage 2 (HCC)   [place] [facility] Hadassah SQUIBB, MD   2 weeks ago Decubitus ulcer of left hip, stage 2 [facility])   [place] [facility] Hadassah SQUIBB, MD   1 month ago Primary hypertension   [place] [facility] Hadassah SQUIBB, MD

## 2024-02-13 NOTE — Progress Notes (Unsigned)
   Office Visit  BP (!) 148/103   Pulse (!) 117   Temp 97.8 F (36.6 C) (Oral)   Wt 108 lb (49 kg)   SpO2 95%   BMI 19.75 kg/m    Subjective:    Patient ID: Nicole [name], female    DOB: Dec 16, 1960, 63 y.o.   MRN: [id number]  HPI: Nicole [name] is a 63 y.o. female  Chief Complaint  Patient presents with   Wound Check    Discussed the use of AI scribe software for clinical note transcription with the patient, who gave verbal consent to proceed.  History of Present Illness     Relevant past medical, surgical, family and social history reviewed and updated as indicated. Interim medical history since our last visit reviewed. Allergies and medications reviewed and updated.  ROS per HPI unless specifically indicated above     Objective:    BP (!) 148/103   Pulse (!) 117   Temp 97.8 F (36.6 C) (Oral)   Wt 108 lb (49 kg)   SpO2 95%   BMI 19.75 kg/m   Wt Readings from Last 3 Encounters:  02/13/24 108 lb (49 kg)  01/30/24 106 lb (48.1 kg)  01/14/24 103 lb 6.3 oz (46.9 kg)     Physical Exam      02/06/2024    2:54 PM 01/30/2024    2:49 PM 04/17/2022    5:16 PM 04/10/2022    2:17 PM 03/06/2022    2:18 PM  Depression screen PHQ 2/9  Decreased Interest 2 1 2 3 2   Down, Depressed, Hopeless 2 1 2 2 2   PHQ - 2 Score 4 2 4 5 4   Altered sleeping 0 1 3 3 2   Tired, decreased energy 3 1 3 2 3   Change in appetite 2 1 3 3  0  Feeling bad or failure about yourself  2 1 1 1  0  Trouble concentrating 2 2 3 3  0  Moving slowly or fidgety/restless 2 2 1  0 0  Suicidal thoughts 1 2 0 0 0  PHQ-9 Score 16 12 18 17 9   Difficult doing work/chores Somewhat difficult Somewhat difficult  Very difficult Somewhat difficult       02/06/2024    2:54 PM 01/30/2024    2:49 PM 04/17/2022    5:16 PM 04/10/2022    2:16 PM  GAD 7 : Generalized Anxiety Score  Nervous, Anxious, on Edge 2 1 3 3   Control/stop worrying 2 1 3 3   Worry too much - different things 2 1 3 3   Trouble  relaxing 2 1 3 3   Restless 2 1 2  0  Easily annoyed or irritable 2 1 0 0  Afraid - awful might happen 2 1 3 3   Total GAD 7 Score 14 7 17 15   Anxiety Difficulty Very difficult Somewhat difficult Very difficult Very difficult       Assessment & Plan:  Assessment & Plan   Primary hypertension     Assessment and Plan Assessment & Plan      Follow up plan: No follow-ups on file.  Hadassah SHAUNNA Nett, MD

## 2024-02-14 ENCOUNTER — Inpatient Hospital Stay: Admitting: Pediatrics

## 2024-02-14 ENCOUNTER — Other Ambulatory Visit: Payer: Self-pay

## 2024-02-16 ENCOUNTER — Encounter: Payer: Self-pay | Admitting: Pediatrics

## 2024-02-18 ENCOUNTER — Encounter: Payer: Self-pay | Admitting: Pediatrics

## 2024-02-18 ENCOUNTER — Other Ambulatory Visit (HOSPITAL_COMMUNITY): Payer: Self-pay

## 2024-02-18 ENCOUNTER — Telehealth: Payer: Self-pay

## 2024-02-18 DIAGNOSIS — L299 Pruritus, unspecified: Secondary | ICD-10-CM | POA: Insufficient documentation

## 2024-02-18 DIAGNOSIS — F1191 Opioid use, unspecified, in remission: Secondary | ICD-10-CM | POA: Insufficient documentation

## 2024-02-18 NOTE — Assessment & Plan Note (Signed)
 Stable on naltrexone . CTM.

## 2024-02-18 NOTE — Telephone Encounter (Signed)
 Pharmacy Patient Advocate Encounter   Received notification from CoverMyMeds that prior authorization for Breztri  Aerosphere 160-9-4.8MCG/ACT aerosol is required/requested.   Insurance verification completed.   The patient is insured through [facility] .   Per test claim: PA required; PA started via CoverMyMeds. KEY [id number] . Waiting for clinical questions to populate.

## 2024-02-18 NOTE — Assessment & Plan Note (Signed)
 Improved sx on breztri . Plan to send refills.

## 2024-02-18 NOTE — Telephone Encounter (Signed)
 Pharmacy Patient Advocate Encounter  Received notification from [facility] that Prior Authorization for Breztri  Aerosphere 160-9-4.8MCG/ACT aerosol has been APPROVED from 02/04/2024 to 02/17/2025   PA #/Case ID/Reference #: [id number]

## 2024-02-18 NOTE — Assessment & Plan Note (Signed)
 Hypertension managed with olmesartan  40 mg. Blood pressure improved but slightly elevated. - Continue olmesartan  at 40 mg. - Initiate amlodipine  at low dose for additional blood pressure control. - Discontinue lisinopril . - Ensure hydralazine  is not being taken.

## 2024-02-18 NOTE — Assessment & Plan Note (Signed)
 Bipolar disorder managed with Seroquel . Seroquel  aids mood stabilization but not sleep. Wishes to continue Seroquel . - Continue Seroquel  for bipolar disorder management.

## 2024-02-18 NOTE — Assessment & Plan Note (Signed)
 Wound improving with new skin formation and reduced depth. Itching and sticking to clothing likely due to healing. - Evaluate wound at next appointment with wound care team on August 28th. - Consider using a barrier to prevent sticking to clothing. - Discuss prevention strategies for future wounds with wound care team.

## 2024-02-18 NOTE — Assessment & Plan Note (Signed)
 Severe pruritus causing scratching and welts. Hydroxyzine  prescribed but not used. Possible relation to anxiety or other conditions. - Prescribe hydroxyzine  for itchiness, especially at night. - Recommend over-the-counter sarna cream for itch relief.

## 2024-02-18 NOTE — Telephone Encounter (Signed)
 Clinical questions answered, PA currently pending

## 2024-02-18 NOTE — Assessment & Plan Note (Signed)
 Opioid dependence managed with Suboxone . Taking over temporarily given unable to travel far due to ulcer. - Continue Suboxone  for opioid dependence. - Discuss potential transfer of Suboxone  management to current provider.

## 2024-02-20 ENCOUNTER — Other Ambulatory Visit: Payer: Self-pay

## 2024-02-21 ENCOUNTER — Other Ambulatory Visit: Payer: Self-pay

## 2024-02-24 ENCOUNTER — Other Ambulatory Visit: Payer: Self-pay

## 2024-02-24 ENCOUNTER — Other Ambulatory Visit: Payer: Self-pay | Admitting: Pediatrics

## 2024-02-24 DIAGNOSIS — F1190 Opioid use, unspecified, uncomplicated: Secondary | ICD-10-CM

## 2024-02-25 ENCOUNTER — Other Ambulatory Visit: Payer: Self-pay

## 2024-02-25 ENCOUNTER — Other Ambulatory Visit: Payer: Self-pay | Admitting: Pediatrics

## 2024-02-25 DIAGNOSIS — F1190 Opioid use, unspecified, uncomplicated: Secondary | ICD-10-CM

## 2024-02-25 NOTE — Telephone Encounter (Signed)
 Requested medications are due for refill today.  unsure  Requested medications are on the active medications list.  yes  Last refill. 02/10/2024 #42 0 rf  Future visit scheduled.   yes  Notes to clinic.  Refill not delegated.    Requested Prescriptions  Pending Prescriptions Disp Refills   Buprenorphine  HCl-Naloxone  HCl 8-2 MG FILM 42 Film 0    Sig: Place 1 Film under the tongue 2 (two) times daily for 21 days.     Not Delegated - Analgesics:  Opioid Agonist Combinations Failed - 02/25/2024  5:53 PM      Failed - This refill cannot be delegated      Failed - Urine Drug Screen completed in last 360 days      Passed - Valid encounter within last 3 months    Recent Outpatient Visits           1 week ago Decubitus ulcer of left hip, stage 2 (HCC)   [place] [facility] Herold Hadassah SQUIBB, MD   2 weeks ago Decubitus ulcer of left hip, stage 2 [facility])   [place] [facility] Herold Hadassah SQUIBB, MD   3 weeks ago Decubitus ulcer of left hip, stage 2 [facility])   [place] [facility] Herold Hadassah SQUIBB, MD   1 month ago Primary hypertension   [place] [facility] Herold Hadassah SQUIBB, MD

## 2024-02-26 ENCOUNTER — Other Ambulatory Visit: Payer: Self-pay

## 2024-02-26 ENCOUNTER — Other Ambulatory Visit: Payer: Self-pay | Admitting: Pediatrics

## 2024-02-26 DIAGNOSIS — F1190 Opioid use, unspecified, uncomplicated: Secondary | ICD-10-CM

## 2024-02-26 NOTE — Telephone Encounter (Signed)
 Requested medication (s) are due for refill today: yes  Requested medication (s) are on the active medication list: yes  Last refill:  02/06/24  Future visit scheduled: yes  Notes to clinic:  Unable to refill per protocol, cannot delegate.      Requested Prescriptions  Pending Prescriptions Disp Refills   Buprenorphine  HCl-Naloxone  HCl 8-2 MG FILM 42 Film 0    Sig: Place 1 Film under the tongue 2 (two) times daily for 21 days.     Not Delegated - Analgesics:  Opioid Agonist Combinations Failed - 02/26/2024 12:57 PM      Failed - This refill cannot be delegated      Failed - Urine Drug Screen completed in last 360 days      Passed - Valid encounter within last 3 months    Recent Outpatient Visits           1 week ago Decubitus ulcer of left hip, stage 2 (HCC)   [place] [facility] Herold Hadassah SQUIBB, MD   2 weeks ago Decubitus ulcer of left hip, stage 2 [facility])   [place] [facility] Herold Hadassah SQUIBB, MD   3 weeks ago Decubitus ulcer of left hip, stage 2 [facility])   [place] [facility] Herold Hadassah SQUIBB, MD   1 month ago Primary hypertension   [place] [facility] Herold Hadassah SQUIBB, MD

## 2024-02-27 ENCOUNTER — Encounter: Attending: Internal Medicine | Admitting: Internal Medicine

## 2024-02-27 ENCOUNTER — Ambulatory Visit: Admitting: Internal Medicine

## 2024-02-27 DIAGNOSIS — R531 Weakness: Secondary | ICD-10-CM | POA: Diagnosis not present

## 2024-02-27 DIAGNOSIS — E66813 Obesity, class 3: Secondary | ICD-10-CM | POA: Diagnosis not present

## 2024-02-27 DIAGNOSIS — L89890 Pressure ulcer of other site, unstageable: Secondary | ICD-10-CM | POA: Insufficient documentation

## 2024-02-27 DIAGNOSIS — E440 Moderate protein-calorie malnutrition: Secondary | ICD-10-CM | POA: Diagnosis not present

## 2024-02-27 DIAGNOSIS — F17200 Nicotine dependence, unspecified, uncomplicated: Secondary | ICD-10-CM | POA: Insufficient documentation

## 2024-02-27 DIAGNOSIS — N179 Acute kidney failure, unspecified: Secondary | ICD-10-CM | POA: Diagnosis not present

## 2024-02-27 DIAGNOSIS — L89222 Pressure ulcer of left hip, stage 2: Secondary | ICD-10-CM | POA: Diagnosis not present

## 2024-02-27 NOTE — Telephone Encounter (Signed)
 Requested medications are due for refill today.  yes  Requested medications are on the active medications list.  yes  Last refill. 02/10/2024 #42 0 rf  Future visit scheduled.   yes  Notes to clinic.  Refill not delegated.    Requested Prescriptions  Pending Prescriptions Disp Refills   Buprenorphine  HCl-Naloxone  HCl 8-2 MG FILM [Pharmacy Med Name: BUPRENORPHINE -NALOX 8-2MG  FILM] 14 Film 0    Sig: PLACE 1 FILM UNDER THE TONGUE 2 (TWO) TIMES DAILY FOR 7 DAYS.     Not Delegated - Analgesics:  Opioid Agonist Combinations Failed - 02/27/2024  5:47 PM      Failed - This refill cannot be delegated      Failed - Urine Drug Screen completed in last 360 days      Passed - Valid encounter within last 3 months    Recent Outpatient Visits           2 weeks ago Decubitus ulcer of left hip, stage 2 (HCC)   [place] [facility] Herold Hadassah SQUIBB, MD   3 weeks ago Decubitus ulcer of left hip, stage 2 [facility])   [place] [facility] Herold Hadassah SQUIBB, MD   4 weeks ago Decubitus ulcer of left hip, stage 2 [facility])   [place] [facility] Herold Hadassah SQUIBB, MD   1 month ago Primary hypertension    [facility] Herold Hadassah SQUIBB, MD

## 2024-02-28 ENCOUNTER — Ambulatory Visit: Admitting: Pediatrics

## 2024-02-28 ENCOUNTER — Other Ambulatory Visit: Payer: Self-pay

## 2024-02-28 VITALS — BP 179/114 | HR 96 | Temp 98.5°F | Wt 115.6 lb

## 2024-02-28 DIAGNOSIS — L89222 Pressure ulcer of left hip, stage 2: Secondary | ICD-10-CM

## 2024-02-28 DIAGNOSIS — F1191 Opioid use, unspecified, in remission: Secondary | ICD-10-CM | POA: Diagnosis not present

## 2024-02-28 DIAGNOSIS — F1190 Opioid use, unspecified, uncomplicated: Secondary | ICD-10-CM

## 2024-02-28 DIAGNOSIS — J449 Chronic obstructive pulmonary disease, unspecified: Secondary | ICD-10-CM

## 2024-02-28 DIAGNOSIS — I10 Essential (primary) hypertension: Secondary | ICD-10-CM

## 2024-02-28 MED ORDER — HYDROCHLOROTHIAZIDE 12.5 MG PO CAPS: 12.5000 mg | ORAL_CAPSULE | Freq: Every day | ORAL | 3 refills | Status: DC

## 2024-02-28 MED ORDER — BUPRENORPHINE HCL-NALOXONE HCL 8-2 MG SL FILM
1.0000 | ORAL_FILM | Freq: Two times a day (BID) | SUBLINGUAL | 0 refills | Status: DC
  Filled 2024-02-28 – 2024-03-11 (×2): qty 60, 30d supply, fill #0

## 2024-02-28 NOTE — Progress Notes (Signed)
 Office Visit  BP (!) 179/114   Pulse 96   Temp 98.5 F (36.9 C) (Oral)   Wt 115 lb 9.6 oz (52.4 kg)   SpO2 98%   BMI 21.14 kg/m    Subjective:    Patient ID: Nicole [name], female    DOB: 10/10/1960, 63 y.o.   MRN: [id number]  HPI: Nicole [name] is a 63 y.o. female  Chief Complaint  Patient presents with   Wound Check    Discussed the use of AI scribe software for clinical note transcription with the patient, who gave verbal consent to proceed.  History of Present Illness   Nicole [name] is a 63 year old female who presents with persistent wound pain and difficulty obtaining recommended wound care supplies.  She experiences persistent soreness in a wound that [facility]. She has been unable to find [employer] at [employer] including [facility], [facility], and [facility], but has not checked [place] yet. The cost of bandages is a concern, and she is advised to change them every other day.  She is currently taking Suboxone  for pain management and uses Tylenol  at home for additional pain relief. She accidentally discarded the Suboxone  bottle and inquired about the refill date, which is September 11.  She has been prescribed amlodipine  5 mg for blood pressure management but reports feeling unwell and scared when taking it, leading her to discontinue its use. She is open to trying alternative medications such as hydrochlorothiazide  or lisinopril .  She uses an inhaler, Breztri , but is unsure of its effectiveness. She previously used Symbicort  and is currently out of it. She is open to trying Breztri  again to assess its impact on her symptoms.        Relevant past medical, surgical, family and social history reviewed and updated as indicated. Interim medical history since our last visit reviewed. Allergies and medications reviewed and updated.  ROS per HPI unless specifically indicated above     Objective:    BP (!) 179/114    Pulse 96   Temp 98.5 F (36.9 C) (Oral)   Wt 115 lb 9.6 oz (52.4 kg)   SpO2 98%   BMI 21.14 kg/m   Wt Readings from Last 3 Encounters:  02/28/24 115 lb 9.6 oz (52.4 kg)  02/13/24 108 lb (49 kg)  01/30/24 106 lb (48.1 kg)     Physical Exam Constitutional:      Appearance: Normal appearance.  Pulmonary:     Effort: Pulmonary effort is normal.  Musculoskeletal:        General: Normal range of motion.  Skin:    Comments: Normal skin color  Neurological:     General: No focal deficit present.     Mental Status: She is alert. Mental status is at baseline.  Psychiatric:        Mood and Affect: Mood normal.        Behavior: Behavior normal.        Thought Content: Thought content normal.         02/28/2024    2:32 PM 02/06/2024    2:54 PM 01/30/2024    2:49 PM 04/17/2022    5:16 PM 04/10/2022    2:17 PM  Depression screen PHQ 2/9  Decreased Interest 2 2 1 2 3   Down, Depressed, Hopeless 2 2 1 2 2   PHQ - 2 Score 4 4 2 4 5   Altered sleeping 3 0 1 3 3  Tired, decreased energy 2 3 1 3 2   Change in appetite 2 2 1 3 3   Feeling bad or failure about yourself  2 2 1 1 1   Trouble concentrating 2 2 2 3 3   Moving slowly or fidgety/restless 2 2 2 1  0  Suicidal thoughts 0 1 2 0 0  PHQ-9 Score 17 16 12 18 17   Difficult doing work/chores  Somewhat difficult Somewhat difficult  Very difficult       02/28/2024    2:33 PM 02/06/2024    2:54 PM 01/30/2024    2:49 PM 04/17/2022    5:16 PM  GAD 7 : Generalized Anxiety Score  Nervous, Anxious, on Edge 2 2 1 3   Control/stop worrying 3 2 1 3   Worry too much - different things 3 2 1 3   Trouble relaxing 2 2 1 3   Restless 2 2 1 2   Easily annoyed or irritable 2 2 1  0  Afraid - awful might happen 2 2 1 3   Total GAD 7 Score 16 14 7 17   Anxiety Difficulty Somewhat difficult Very difficult Somewhat difficult Very difficult       Assessment & Plan:  Assessment & Plan   Decubitus ulcer of left hip, stage 2 (HCC) Assessment & Plan: Wound  remains painful. Hesitant to remove scab due to pain concerns. Unable to find Meta Honey locally. - Contact wound care specialist about Meta Honey availability and pharmacies. - Provide nonstick bandages and Tinaderm. - Advise use of antibacterial Dial soap for wound care. - Follow up with wound care specialist in two weeks.   Chronic obstructive pulmonary disease, unspecified COPD type (HCC) Assessment & Plan: Using Breztri  for COPD management. Uncertain of its effectiveness compared to Symbicort . Requires refill. - Provide Breztri  refill for one month. - Instruct to assess Breztri  effectiveness and report differences at next visit.   Primary hypertension Assessment & Plan: Blood pressure management needs adjustment. Amlodipine  increase reconsidered due to adverse reaction. - Prescribe low dose hydrochlorothiazide  as alternative to increasing amlodipine .  Orders: -     hydroCHLOROthiazide ; Take 1 capsule (12.5 mg total) by mouth daily.  Dispense: 30 capsule; Refill: 3  Opioid use disorder in remission Assessment & Plan: Stable managed w Suboxone . Refill scheduled for September 11. Clinic policy limits pain medication prescriptions. She will return to [facility] after recovers from ulcer and can travel to [place]. - Refill Suboxone  on September 11. - Advise continued use of Tylenol  for pain management.  Orders: -     Buprenorphine  HCl-Naloxone  HCl; Place 1 Film under the tongue 2 (two) times daily.  Dispense: 60 Film; Refill: 0      Follow up plan: Return in about 4 weeks (around 03/27/2024) for Chronic illness f/u.  Hadassah SHAUNNA Nett, MD

## 2024-02-28 NOTE — Patient Instructions (Addendum)
 For your blood pressure, start hydrochlorothiazide  12.5mg  daily Do not take amlodipine , keep taking olmesartan  40mg   I will reach out to the wound people to see where they might have your supplies  Let me know how the breztri  goes -

## 2024-03-03 ENCOUNTER — Other Ambulatory Visit: Payer: Self-pay

## 2024-03-06 ENCOUNTER — Other Ambulatory Visit: Payer: Self-pay

## 2024-03-08 ENCOUNTER — Encounter: Payer: Self-pay | Admitting: Pediatrics

## 2024-03-08 NOTE — Assessment & Plan Note (Signed)
 Using Breztri  for COPD management. Uncertain of its effectiveness compared to Symbicort . Requires refill. - Provide Breztri  refill for one month. - Instruct to assess Breztri  effectiveness and report differences at next visit.

## 2024-03-08 NOTE — Assessment & Plan Note (Signed)
 Stable managed w Suboxone . Refill scheduled for September 11. Clinic policy limits pain medication prescriptions. She will return to [facility] after recovers from ulcer and can travel to . - Refill Suboxone  on September 11. - Advise continued use of Tylenol  for pain management.

## 2024-03-08 NOTE — Assessment & Plan Note (Signed)
 Wound remains painful. Hesitant to remove scab due to pain concerns. Unable to find Meta Honey locally. - Contact wound care specialist about Meta Honey availability and pharmacies. - Provide nonstick bandages and Tinaderm. - Advise use of antibacterial Dial soap for wound care. - Follow up with wound care specialist in two weeks.

## 2024-03-08 NOTE — Assessment & Plan Note (Signed)
 Blood pressure management needs adjustment. Amlodipine  increase reconsidered due to adverse reaction. - Prescribe low dose hydrochlorothiazide  as alternative to increasing amlodipine .

## 2024-03-10 ENCOUNTER — Other Ambulatory Visit: Payer: Self-pay

## 2024-03-11 ENCOUNTER — Other Ambulatory Visit: Payer: Self-pay

## 2024-03-12 ENCOUNTER — Encounter: Admitting: Physician Assistant

## 2024-03-13 DIAGNOSIS — Z419 Encounter for procedure for purposes other than remedying health state, unspecified: Secondary | ICD-10-CM | POA: Diagnosis not present

## 2024-03-20 ENCOUNTER — Other Ambulatory Visit: Payer: Self-pay | Admitting: Pediatrics

## 2024-03-20 ENCOUNTER — Ambulatory Visit: Admitting: Physician Assistant

## 2024-03-20 ENCOUNTER — Other Ambulatory Visit: Payer: Self-pay

## 2024-03-20 DIAGNOSIS — F1191 Opioid use, unspecified, in remission: Secondary | ICD-10-CM

## 2024-03-23 ENCOUNTER — Other Ambulatory Visit: Payer: Self-pay

## 2024-03-24 ENCOUNTER — Other Ambulatory Visit: Payer: Self-pay | Admitting: Pediatrics

## 2024-03-24 ENCOUNTER — Other Ambulatory Visit: Payer: Self-pay

## 2024-03-24 DIAGNOSIS — F1191 Opioid use, unspecified, in remission: Secondary | ICD-10-CM

## 2024-03-24 NOTE — Telephone Encounter (Unsigned)
 Copied from CRM #[id number]. Topic: Clinical - Medication Refill >> Mar 24, 2024  5:01 PM Kevelyn M wrote: Medication: Buprenorphine  HCl-Naloxone  HCl 8-2 MG FILM  Has the patient contacted their pharmacy? Yes (Agent: If no, request that the patient contact the pharmacy for the refill. If patient does not wish to contact the pharmacy document the reason why and proceed with request.) (Agent: If yes, when and what did the pharmacy advise?)  This is the patient's preferred pharmacy:  [id number] [place] - [address] [place] [address] Phone: [phone] Fax: [phone]  Is this the correct pharmacy for this prescription? Yes If no, delete pharmacy and type the correct one.   Has the prescription been filled recently? No  Is the patient out of the medication? Yes  Has the patient been seen for an appointment in the last year OR does the patient have an upcoming appointment? Yes  Can we respond through MyChart? No  Agent: Please be advised that Rx refills may take up to 3 business days. We ask that you follow-up with your pharmacy.

## 2024-03-24 NOTE — Telephone Encounter (Unsigned)
 Copied from CRM [id number]. Topic: Clinical - Medication Refill >> Mar 24, 2024  9:31 AM Cynthia K wrote: Medication: Buprenorphine  HCl-Naloxone  HCl 8-2 MG FILM  Has the patient contacted their pharmacy? Yes (Agent: If no, request that the patient contact the pharmacy for the refill. If patient does not wish to contact the pharmacy document the reason why and proceed with request.) (Agent: If yes, when and what did the pharmacy advise?) Pharmacy needs order to refill  This is the patient's preferred pharmacy:  [id number] [place] - [address] [place] [address] Phone: [phone] Fax: [phone]  Is this the correct pharmacy for this prescription? Yes If no, delete pharmacy and type the correct one.   Has the prescription been filled recently? No  Is the patient out of the medication? Yes She has been out of medications a few weeks.  Has the patient been seen for an appointment in the last year OR does the patient have an upcoming appointment? Yes  Can we respond through MyChart? No  Agent: Please be advised that Rx refills may take up to 3 business days. We ask that you follow-up with your pharmacy.

## 2024-03-25 NOTE — Telephone Encounter (Signed)
 Requested medication (s) are due for refill today: yes  Requested medication (s) are on the active medication list: yes  Last refill:  02/28/24  Future visit scheduled: yes  Notes to clinic:  Unable to refill per protocol, cannot delegate.      Requested Prescriptions  Pending Prescriptions Disp Refills   Buprenorphine  HCl-Naloxone  HCl 8-2 MG FILM 60 Film 0    Sig: Place 1 Film under the tongue 2 (two) times daily.     Not Delegated - Analgesics:  Opioid Agonist Combinations Failed - 03/25/2024  9:40 AM      Failed - This refill cannot be delegated      Failed - Urine Drug Screen completed in last 360 days      Passed - Valid encounter within last 3 months    Recent Outpatient Visits           3 weeks ago Decubitus ulcer of left hip, stage 2 (HCC)   [place] [facility] Herold Hadassah SQUIBB, MD   1 month ago Decubitus ulcer of left hip, stage 2 [facility])   [place] [facility] Herold Hadassah SQUIBB, MD   1 month ago Decubitus ulcer of left hip, stage 2 [facility])   [place] [facility] Herold Hadassah SQUIBB, MD   1 month ago Decubitus ulcer of left hip, stage 2 [facility])   [place] [facility] Herold Hadassah SQUIBB, MD   2 months ago Primary hypertension   [place] [facility] Herold Hadassah SQUIBB, MD

## 2024-03-26 NOTE — Telephone Encounter (Signed)
 Just picked up, can discuss at upcoming appointment.

## 2024-03-26 NOTE — Telephone Encounter (Signed)
 Requested medications are due for refill today.  unsure  Requested medications are on the active medications list.  yes  Last refill. Pt has a refill starting 04/10/2024.  Future visit scheduled.   yes  Notes to clinic.  Refill not delegated.    Requested Prescriptions  Pending Prescriptions Disp Refills   Buprenorphine  HCl-Naloxone  HCl 8-2 MG FILM 60 Film 0    Sig: Place 1 Film under the tongue 2 (two) times daily.     Not Delegated - Analgesics:  Opioid Agonist Combinations Failed - 03/26/2024  8:36 AM      Failed - This refill cannot be delegated      Failed - Urine Drug Screen completed in last 360 days      Passed - Valid encounter within last 3 months    Recent Outpatient Visits           3 weeks ago Decubitus ulcer of left hip, stage 2 (HCC)   [place] [facility] Herold Hadassah SQUIBB, MD   1 month ago Decubitus ulcer of left hip, stage 2 [facility])   [place] [facility] Herold Hadassah SQUIBB, MD   1 month ago Decubitus ulcer of left hip, stage 2 [facility])   [place] [facility] Herold Hadassah SQUIBB, MD   1 month ago Decubitus ulcer of left hip, stage 2 [facility])   [place] [facility] Herold Hadassah SQUIBB, MD   2 months ago Primary hypertension   [place] [facility] Herold Hadassah SQUIBB, MD

## 2024-03-27 ENCOUNTER — Ambulatory Visit: Admitting: Pediatrics

## 2024-03-29 DIAGNOSIS — L89222 Pressure ulcer of left hip, stage 2: Secondary | ICD-10-CM | POA: Diagnosis not present

## 2024-03-29 DIAGNOSIS — E66813 Obesity, class 3: Secondary | ICD-10-CM | POA: Diagnosis not present

## 2024-03-29 DIAGNOSIS — R531 Weakness: Secondary | ICD-10-CM | POA: Diagnosis not present

## 2024-03-29 DIAGNOSIS — N179 Acute kidney failure, unspecified: Secondary | ICD-10-CM | POA: Diagnosis not present

## 2024-03-30 ENCOUNTER — Ambulatory Visit: Admitting: Physician Assistant

## 2024-03-31 ENCOUNTER — Ambulatory Visit: Payer: Self-pay

## 2024-03-31 NOTE — Telephone Encounter (Signed)
 FYI Only or Action Required?: FYI only for provider.  Patient was last seen in primary care on 02/28/2024 by Herold Hadassah SQUIBB, MD.  Called Nurse Triage reporting Fall.  Symptoms began about a month ago.  Interventions attempted: Nothing.  Symptoms are: stable.  Triage Disposition: See PCP When Office is Open (Within 3 Days)  Patient/caregiver understands and will follow disposition?: Yes Reason for Disposition  MILD weakness (e.g., does not interfere with ability to work, go to school, normal activities)  (Exception: Mild weakness is a chronic symptom.)  Answer Assessment - Initial Assessment Questions Has high blood pressure, takes medication daily. Taking Naltrexone . Fallen 4 times in the last month from dizzy spells. Patient states feeling so depressed and is all messed up from previous hospital stays.   1. MECHANISM: How did the fall happen?     Went into kitchen to get ice, got dizzy blacked out and woke up on the floor, took about 10-15 minutes to get off the floor.  2. ONSET: When did the fall happen? (e.g., minutes, hours, or days ago)     Sunday or Monday night, can't remember. Almost fell again today  3. LOCATION: What part of the body hit the ground? (e.g., back, buttocks, head, hips, knees, hands, head, stomach)     Left side back, knee, hip and arm  4. PAIN: Is there any pain? If Yes, ask: How bad is the pain? (e.g., Scale 0-10; or none, mild,      Moderate  5. SIZE: For cuts, bruises, or swelling, ask: How large is it? (e.g., inches or centimeters)      Bruises, scabs on arms, back ribs  9. OTHER SYMPTOMS: Do you have any other symptoms? (e.g., dizziness, fever, weakness; new-onset or worsening).      Dizziness  10. CAUSE: What do you think caused the fall (or falling)? (e.g., dizzy spell, tripped)       Getting dizzy spells  Protocols used: Falls and [facility]  Copied from CRM [id number]. Topic: Clinical - Red Word Triage >> Mar 31, 2024  10:16 AM Nathanel BROCKS wrote: Red Word that prompted transfer to Nurse Triage:   Dizzy, passing out, sore ribs, knee and hip (on left side) from fall.   ----------------------------------------------------------------------- From previous Reason for Contact - Scheduling: Patient/patient representative is calling to schedule an appointment. Refer to attachments for appointment information.

## 2024-04-02 ENCOUNTER — Ambulatory Visit: Admitting: Pediatrics

## 2024-04-02 VITALS — BP 116/78 | HR 123 | Temp 97.8°F | Wt 119.0 lb

## 2024-04-02 DIAGNOSIS — W19XXXA Unspecified fall, initial encounter: Secondary | ICD-10-CM | POA: Diagnosis not present

## 2024-04-02 DIAGNOSIS — R000 Tachycardia, unspecified: Secondary | ICD-10-CM

## 2024-04-02 DIAGNOSIS — J449 Chronic obstructive pulmonary disease, unspecified: Secondary | ICD-10-CM | POA: Diagnosis not present

## 2024-04-02 DIAGNOSIS — Z8659 Personal history of other mental and behavioral disorders: Secondary | ICD-10-CM

## 2024-04-02 DIAGNOSIS — F1191 Opioid use, unspecified, in remission: Secondary | ICD-10-CM

## 2024-04-02 DIAGNOSIS — J418 Mixed simple and mucopurulent chronic bronchitis: Secondary | ICD-10-CM

## 2024-04-02 DIAGNOSIS — I10 Essential (primary) hypertension: Secondary | ICD-10-CM | POA: Diagnosis not present

## 2024-04-02 DIAGNOSIS — F32A Depression, unspecified: Secondary | ICD-10-CM

## 2024-04-02 LAB — MICROSCOPIC EXAMINATION: Bacteria, UA: NONE SEEN

## 2024-04-02 LAB — URINALYSIS, ROUTINE W REFLEX MICROSCOPIC
Bilirubin, UA: NEGATIVE
Glucose, UA: NEGATIVE
Leukocytes,UA: NEGATIVE
Nitrite, UA: NEGATIVE
RBC, UA: NEGATIVE
Specific Gravity, UA: 1.015 (ref 1.005–1.030)
Urobilinogen, Ur: 1 mg/dL (ref 0.2–1.0)
pH, UA: 6.5 (ref 5.0–7.5)

## 2024-04-02 MED ORDER — ALBUTEROL SULFATE HFA 108 (90 BASE) MCG/ACT IN AERS: 1.0000 | INHALATION_SPRAY | Freq: Four times a day (QID) | RESPIRATORY_TRACT | 5 refills | Status: DC | PRN

## 2024-04-02 MED ORDER — TRAMADOL HCL 50 MG PO TABS: 50.0000 mg | ORAL_TABLET | Freq: Three times a day (TID) | ORAL | 0 refills | Status: AC | PRN

## 2024-04-02 MED ORDER — BREZTRI AEROSPHERE 160-9-4.8 MCG/ACT IN AERO: 2.0000 | INHALATION_SPRAY | Freq: Two times a day (BID) | RESPIRATORY_TRACT | 11 refills | Status: DC

## 2024-04-02 NOTE — Progress Notes (Addendum)
 Office Visit  BP 116/78   Pulse (!) 123   Temp 97.8 F (36.6 C) (Oral)   Wt 119 lb (54 kg)   SpO2 98%   BMI 21.77 kg/m    Subjective:    Patient ID: Nicole [name], female    DOB: [birth year], 63 y.o.   MRN: [id number]  HPI: Nicole [name] is a 63 y.o. female  Chief Complaint  Patient presents with   Fall    Pt got dizzy and fell while getting    Follow-up    Pt wants bloodwork     Discussed the use of AI scribe software for clinical note transcription with the patient, who gave verbal consent to proceed.  History of Present Illness   Nicole [name] is a 63 year old female who presents with left rib and hip pain following a fall.  She has been experiencing significant pain in her left rib and right hip following a fall that occurred three days ago. She reports that the pain is affecting her ability to walk, and she has also experienced dizziness. She did not seek emergency care at the time of the fall.  She describes her heart rate as elevated and 'pounding'. She is currently taking hydrochlorothiazide  12.5 mg for blood pressure management but is not taking olmesartan  or lisinopril , which were previously prescribed. She is confused about her medication regimen.  She has a scab on her hip that stuck to her pajamas and was painful to remove. She uses Neosporin and has petroleum jelly at home but does not use a Band-Aid due to fear of pulling off the scab. The scab on her hip swelled up like a knot after it was ripped off this morning.  She is currently using Suboxone  and prefers to continue receiving it locally rather than traveling to [place]. She mentions her inhaler is out and requests a refill for gabapentin , which she has been using for pain management. She has previously used oxycodone  and Tylenol  for pain relief.      Relevant past medical, surgical, family and social history reviewed and updated as indicated. Interim medical history since our last visit  reviewed. Allergies and medications reviewed and updated.  ROS per HPI unless specifically indicated above     Objective:    BP 116/78   Pulse (!) 123   Temp 97.8 F (36.6 C) (Oral)   Wt 119 lb (54 kg)   SpO2 98%   BMI 21.77 kg/m   Wt Readings from Last 3 Encounters:  04/02/24 119 lb (54 kg)  02/28/24 115 lb 9.6 oz (52.4 kg)  02/13/24 108 lb (49 kg)     Physical Exam Constitutional:      Appearance: Normal appearance.  Pulmonary:     Effort: Pulmonary effort is normal.  Musculoskeletal:        General: Normal range of motion.  Skin:    Findings: Bruising present.     Comments: Skin abrasions with bruising throughout bilateral upper extremities  Neurological:     General: No focal deficit present.     Mental Status: She is alert. Mental status is at baseline.  Psychiatric:        Mood and Affect: Mood normal.        Behavior: Behavior normal.        Thought Content: Thought content normal.         04/02/2024    2:41 PM 02/28/2024    2:32 PM 02/06/2024  2:54 PM 01/30/2024    2:49 PM 04/17/2022    5:16 PM  Depression screen PHQ 2/9  Decreased Interest 2 2 2 1 2   Down, Depressed, Hopeless 3 2 2 1 2   PHQ - 2 Score 5 4 4 2 4   Altered sleeping 3 3 0 1 3  Tired, decreased energy 3 2 3 1 3   Change in appetite 3 2 2 1 3   Feeling bad or failure about yourself  3 2 2 1 1   Trouble concentrating 2 2 2 2 3   Moving slowly or fidgety/restless 2 2 2 2 1   Suicidal thoughts 1 0 1 2 0  PHQ-9 Score 22 17 16 12 18   Difficult doing work/chores Somewhat difficult  Somewhat difficult Somewhat difficult        04/02/2024    2:42 PM 02/28/2024    2:33 PM 02/06/2024    2:54 PM 01/30/2024    2:49 PM  GAD 7 : Generalized Anxiety Score  Nervous, Anxious, on Edge 3 2 2 1   Control/stop worrying 3 3 2 1   Worry too much - different things 3 3 2 1   Trouble relaxing 3 2 2 1   Restless 2 2 2 1   Easily annoyed or irritable 2 2 2 1   Afraid - awful might happen 3 2 2 1   Total GAD 7 Score  19 16 14 7   Anxiety Difficulty Very difficult Somewhat difficult Very difficult Somewhat difficult       Assessment & Plan:  Assessment & Plan   Primary hypertension Assessment & Plan: Confusion with medication regimen; only taking hydrochlorothiazide . - Continue hydrochlorothiazide  12.5 mg. - Discontinue olmesartan  and lisinopril . - Monitor blood pressure.   Chronic obstructive pulmonary disease, unspecified COPD type (HCC) Assessment & Plan: Requesting breztri  refills.  Orders: -     Breztri  Aerosphere; Inhale 2 puffs into the lungs 2 (two) times daily.  Dispense: 10.7 g; Refill: 11 -     Albuterol  Sulfate HFA; Inhale 1-2 puffs into the lungs once every 6 (six) hours as needed for wheezing or shortness of breath.  Dispense: 6.7 g; Refill: 5  Opioid use disorder in remission Assessment & Plan: On suboxone  therapy. Scheduled suboxone  refill. Discussed would take over these medication given difficulty transportation to [facility]. If needing adjustments would need to go back to that clinic or consider [facility] as they offer virtual visits.   Depression, unspecified depression type History of bipolar disorder Assessment & Plan: Patient with complicated psych hx, unclear which bipolar type. Currently on seroquel  to help with sleep. Plan to refer to psychiatry.   Orders: -     Ambulatory referral to Psychiatry  Tachycardia Elevated heart rate possibly due to pain or anxiety. EKG w sinus tachy. Lab work below.  -     EKG 12-Lead -     CBC with Differential/Platelet -     Basic metabolic panel with GFR -     TSH -     Iron and TIBC -     Ferritin  Fall, initial encounter Pain following a fall with concern for fracture or dislocation. Denied any head trauma. Tramadol sent for acute pain. Wound care done for open abrasions.  - Order x-rays of right hip and left ribs. - Provide high-dose ibuprofen  -     DG HIP UNILAT W OR W/O PELVIS 2-3 VIEWS LEFT; Future -     DG Chest 2 View;  Future -     CBC with Differential/Platelet -  Basic metabolic panel with GFR -     TSH -     Iron and TIBC -     Ferritin -     Urinalysis, Routine w reflex microscopic -     Urine Culture -     traMADol HCl; Take 1 tablet (50 mg total) by mouth every 8 (eight) hours as needed for up to 5 days.  Dispense: 15 tablet; Refill: 0 -     Microscopic Examination   Follow up plan: Return in about 2 weeks (around 04/16/2024) for fall follow up.  Nicole SHAUNNA Nett, MD

## 2024-04-02 NOTE — Patient Instructions (Addendum)
 Please go to this address to get xrays done today of your ribs and hip: [facility] [address] [place],  [place]  [address]  For your pain: Take ibuprofen  every 6-8hours (can take 400-600mg ) Take tylenol  extra strength very 8 hours (2 pills of the 500mg )  For breakthrough pain: you can take tramadol 50mg 

## 2024-04-03 ENCOUNTER — Encounter: Attending: Physician Assistant | Admitting: Physician Assistant

## 2024-04-03 DIAGNOSIS — S41112A Laceration without foreign body of left upper arm, initial encounter: Secondary | ICD-10-CM | POA: Diagnosis not present

## 2024-04-03 DIAGNOSIS — W19XXXA Unspecified fall, initial encounter: Secondary | ICD-10-CM | POA: Diagnosis not present

## 2024-04-03 DIAGNOSIS — S41111A Laceration without foreign body of right upper arm, initial encounter: Secondary | ICD-10-CM | POA: Diagnosis not present

## 2024-04-03 DIAGNOSIS — S51012A Laceration without foreign body of left elbow, initial encounter: Secondary | ICD-10-CM | POA: Insufficient documentation

## 2024-04-03 DIAGNOSIS — E440 Moderate protein-calorie malnutrition: Secondary | ICD-10-CM | POA: Diagnosis not present

## 2024-04-03 DIAGNOSIS — F17210 Nicotine dependence, cigarettes, uncomplicated: Secondary | ICD-10-CM | POA: Diagnosis not present

## 2024-04-03 DIAGNOSIS — Z681 Body mass index (BMI) 19 or less, adult: Secondary | ICD-10-CM | POA: Diagnosis not present

## 2024-04-03 LAB — CBC WITH DIFFERENTIAL/PLATELET
Basophils Absolute: 0 x10E3/uL (ref 0.0–0.2)
Basos: 0 %
EOS (ABSOLUTE): 0.1 x10E3/uL (ref 0.0–0.4)
Eos: 1 %
Hematocrit: 36.3 % (ref 34.0–46.6)
Hemoglobin: 12.3 g/dL (ref 11.1–15.9)
Immature Grans (Abs): 0 x10E3/uL (ref 0.0–0.1)
Immature Granulocytes: 0 %
Lymphocytes Absolute: 1.5 x10E3/uL (ref 0.7–3.1)
Lymphs: 21 %
MCH: 33.2 pg — ABNORMAL HIGH (ref 26.6–33.0)
MCHC: 33.9 g/dL (ref 31.5–35.7)
MCV: 98 fL — ABNORMAL HIGH (ref 79–97)
Monocytes Absolute: 0.4 x10E3/uL (ref 0.1–0.9)
Monocytes: 6 %
Neutrophils Absolute: 4.9 x10E3/uL (ref 1.4–7.0)
Neutrophils: 71 %
Platelets: 184 x10E3/uL (ref 150–450)
RBC: 3.71 x10E6/uL — ABNORMAL LOW (ref 3.77–5.28)
RDW: 12.6 % (ref 11.7–15.4)
WBC: 7 x10E3/uL (ref 3.4–10.8)

## 2024-04-03 LAB — BASIC METABOLIC PANEL WITH GFR
BUN/Creatinine Ratio: 27 (ref 12–28)
BUN: 32 mg/dL — ABNORMAL HIGH (ref 8–27)
CO2: 22 mmol/L (ref 20–29)
Calcium: 9.6 mg/dL (ref 8.7–10.3)
Chloride: 99 mmol/L (ref 96–106)
Creatinine, Ser: 1.2 mg/dL — ABNORMAL HIGH (ref 0.57–1.00)
Glucose: 98 mg/dL (ref 70–99)
Potassium: 3.8 mmol/L (ref 3.5–5.2)
Sodium: 136 mmol/L (ref 134–144)
eGFR: 51 mL/min/1.73 — ABNORMAL LOW (ref >59)

## 2024-04-03 LAB — IRON AND TIBC
Iron Saturation: 12 % — ABNORMAL LOW (ref 15–55)
Iron: 41 ug/dL (ref 27–139)
Total Iron Binding Capacity: 352 ug/dL (ref 250–450)
UIBC: 311 ug/dL (ref 118–369)

## 2024-04-03 LAB — FERRITIN: Ferritin: 168 ng/mL — ABNORMAL HIGH (ref 15–150)

## 2024-04-03 LAB — TSH: TSH: 0.219 u[IU]/mL — ABNORMAL LOW (ref 0.450–4.500)

## 2024-04-06 LAB — URINE CULTURE

## 2024-04-07 ENCOUNTER — Other Ambulatory Visit: Payer: Self-pay | Admitting: Pediatrics

## 2024-04-07 ENCOUNTER — Ambulatory Visit: Payer: Self-pay | Admitting: Pediatrics

## 2024-04-07 DIAGNOSIS — I10 Essential (primary) hypertension: Secondary | ICD-10-CM

## 2024-04-07 DIAGNOSIS — G4700 Insomnia, unspecified: Secondary | ICD-10-CM

## 2024-04-07 DIAGNOSIS — F1191 Opioid use, unspecified, in remission: Secondary | ICD-10-CM

## 2024-04-07 DIAGNOSIS — N3000 Acute cystitis without hematuria: Secondary | ICD-10-CM

## 2024-04-07 MED ORDER — NITROFURANTOIN MONOHYD MACRO 100 MG PO CAPS: 100.0000 mg | ORAL_CAPSULE | Freq: Two times a day (BID) | ORAL | 0 refills | Status: AC

## 2024-04-07 MED ORDER — BUPRENORPHINE HCL-NALOXONE HCL 8-2 MG SL FILM: 1.0000 | ORAL_FILM | Freq: Two times a day (BID) | SUBLINGUAL | 0 refills | Status: DC

## 2024-04-07 NOTE — Progress Notes (Signed)
 Sent macrobid for UTI. Updated suboxone  refill date, can pick up 10/10  Nicole SHAUNNA Nett, MD

## 2024-04-07 NOTE — Telephone Encounter (Signed)
 Copied from CRM [id number]. Topic: Clinical - Medication Question >> Apr 07, 2024  1:03 PM Cleave MATSU wrote: Reason for CRM: pt wants to know if Dr.Santos will fill her heartburn medication for her and also wants to know about Buprenorphine  as well

## 2024-04-08 ENCOUNTER — Encounter: Payer: Self-pay | Admitting: Pediatrics

## 2024-04-08 NOTE — Assessment & Plan Note (Signed)
 Confusion with medication regimen; only taking hydrochlorothiazide . - Continue hydrochlorothiazide  12.5 mg. - Discontinue olmesartan  and lisinopril . - Monitor blood pressure.

## 2024-04-08 NOTE — Assessment & Plan Note (Signed)
 Requesting breztri  refills.

## 2024-04-08 NOTE — Assessment & Plan Note (Signed)
 On suboxone  therapy. Scheduled suboxone  refill. Discussed would take over these medication given difficulty transportation to [facility]. If needing adjustments would need to go back to that clinic or consider [facility] as they offer virtual visits.

## 2024-04-08 NOTE — Assessment & Plan Note (Signed)
 Patient with complicated psych hx, unclear which bipolar type. Currently on seroquel  to help with sleep. Plan to refer to psychiatry.

## 2024-04-09 NOTE — Telephone Encounter (Signed)
 Requested medication (s) are due for refill today: yes  Requested medication (s) are on the active medication list: yes  Last refill:  02/06/24 and 01/07/24  Future visit scheduled: yes  Notes to clinic:  Unable to refill per protocol, cannot delegate Quetiapine . Omeprazole is historical medication.      Requested Prescriptions  Pending Prescriptions Disp Refills   QUEtiapine  (SEROQUEL ) 200 MG tablet [Pharmacy Med Name: QUETIAPINE  FUMARATE 200 MG TAB] 30 tablet 2    Sig: TAKE 1 TABLET BY MOUTH EVERYDAY AT BEDTIME     Not Delegated - Psychiatry:  Antipsychotics - Second Generation (Atypical) - quetiapine  Failed - 04/09/2024 11:42 AM      Failed - This refill cannot be delegated      Failed - TSH in normal range and within 360 days    TSH  Date Value Ref Range Status  04/02/2024 0.219 (L) 0.450 - 4.500 uIU/mL Final         Failed - Last Heart Rate in normal range    Pulse Readings from Last 1 Encounters:  04/02/24 (!) 123         Failed - Lipid Panel in normal range within the last 12 months    Cholesterol, Total  Date Value Ref Range Status  10/03/2021 151 100 - 199 mg/dL Final   Cholesterol  Date Value Ref Range Status  07/30/2022 161 <200 mg/dL Final   LDL Cholesterol (Calc)  Date Value Ref Range Status  07/30/2022 67 mg/dL (calc) Final    Comment:    Reference range: <100 . Desirable range <100 mg/dL for primary prevention;   <70 mg/dL for patients with CHD or diabetic patients  with > or = 2 CHD risk factors. SABRA LDL-C is now calculated using the Martin-Hopkins  calculation, which is a validated novel method providing  better accuracy than the Friedewald equation in the  estimation of LDL-C.  Gladis APPLETHWAITE et al. SANDREA. 7986;689(80): 2061-2068  (http://education.[employer].com/faq/FAQ164)    HDL  Date Value Ref Range Status  07/30/2022 70 > OR = 50 mg/dL Final  95/95/7976 77 >60 mg/dL Final   Triglycerides  Date Value Ref Range Status  07/30/2022 158  (H) <150 mg/dL Final         Failed - CBC within normal limits and completed in the last 12 months    WBC  Date Value Ref Range Status  04/02/2024 7.0 3.4 - 10.8 x10E3/uL Final  01/21/2024 4.6 4.0 - 10.5 K/uL Final   RBC  Date Value Ref Range Status  04/02/2024 3.71 (L) 3.77 - 5.28 x10E6/uL Final  01/21/2024 2.43 (L) 3.87 - 5.11 MIL/uL Final   Hemoglobin  Date Value Ref Range Status  04/02/2024 12.3 11.1 - 15.9 g/dL Final   Hematocrit  Date Value Ref Range Status  04/02/2024 36.3 34.0 - 46.6 % Final   MCHC  Date Value Ref Range Status  04/02/2024 33.9 31.5 - 35.7 g/dL Final  92/77/7974 65.9 30.0 - 36.0 g/dL Final   [facility]  Date Value Ref Range Status  04/02/2024 33.2 (H) 26.6 - 33.0 pg Final  01/21/2024 35.4 (H) 26.0 - 34.0 pg Final   MCV  Date Value Ref Range Status  04/02/2024 98 (H) 79 - 97 fL Final   No results found for: PLTCOUNTKUC, LABPLAT, POCPLA RDW  Date Value Ref Range Status  04/02/2024 12.6 11.7 - 15.4 % Final         Passed - Completed PHQ-2 or PHQ-9 in the last 360 days  Passed - Last BP in normal range    BP Readings from Last 1 Encounters:  04/02/24 116/78         Passed - Valid encounter within last 6 months    Recent Outpatient Visits           1 week ago Primary hypertension   [place] [facility] Herold Hadassah SQUIBB, MD   1 month ago Decubitus ulcer of left hip, stage 2 (HCC)   [place] [facility] Herold Hadassah SQUIBB, MD   1 month ago Decubitus ulcer of left hip, stage 2 (HCC)   [place] [facility] Herold Hadassah SQUIBB, MD   2 months ago Decubitus ulcer of left hip, stage 2 (HCC)   [place] [facility] Herold Hadassah SQUIBB, MD   2 months ago Decubitus ulcer of left hip, stage 2 (HCC)   [place] [facility] Herold Hadassah SQUIBB, MD              Passed - CMP within normal limits and completed in the last 12 months    Albumin  Date Value Ref Range Status   01/14/2024 2.6 (L) 3.5 - 5.0 g/dL Final  92/91/7974 3.5 (L) 3.8 - 4.9 g/dL Final   Alkaline Phosphatase  Date Value Ref Range Status  01/14/2024 82 38 - 126 U/L Final   Alkaline phosphatase (APISO)  Date Value Ref Range Status  07/30/2022 112 37 - 153 U/L Final   ALT  Date Value Ref Range Status  01/14/2024 34 0 - 44 U/L Final   AST  Date Value Ref Range Status  01/14/2024 70 (H) 15 - 41 U/L Final   BUN  Date Value Ref Range Status  04/02/2024 32 (H) 8 - 27 mg/dL Final   Calcium  Date Value Ref Range Status  04/02/2024 9.6 8.7 - 10.3 mg/dL Final   CO2  Date Value Ref Range Status  04/02/2024 22 20 - 29 mmol/L Final   Creat  Date Value Ref Range Status  07/30/2022 1.24 (H) 0.50 - 1.03 mg/dL Final   Creatinine, Ser  Date Value Ref Range Status  04/02/2024 1.20 (H) 0.57 - 1.00 mg/dL Final   Glucose  Date Value Ref Range Status  04/02/2024 98 70 - 99 mg/dL Final   Glucose, Bld  Date Value Ref Range Status  01/21/2024 93 70 - 99 mg/dL Final    Comment:    Glucose reference range applies only to samples taken after fasting for at least 8 hours.   Potassium  Date Value Ref Range Status  04/02/2024 3.8 3.5 - 5.2 mmol/L Final   Sodium  Date Value Ref Range Status  04/02/2024 136 134 - 144 mmol/L Final   Total Bilirubin  Date Value Ref Range Status  01/14/2024 1.2 0.0 - 1.2 mg/dL Final   Bilirubin Total  Date Value Ref Range Status  01/07/2024 0.5 0.0 - 1.2 mg/dL Final   Protein, ur  Date Value Ref Range Status  01/18/2024 100 (A) NEGATIVE mg/dL Final   Protein,UA  Date Value Ref Range Status  04/02/2024 1+ (A) Negative/Trace Final   Total Protein  Date Value Ref Range Status  01/14/2024 6.3 (L) 6.5 - 8.1 g/dL Final  92/91/7974 8.9 (H) 6.0 - 8.5 g/dL Final   GFR calc Af Amer  Date Value Ref Range Status  01/24/2019 >60 >60 mL/min Final   eGFR  Date Value Ref Range Status  04/02/2024 51 (L) >59 mL/min/1.73 Final  GFR, Estimated  Date  Value Ref Range Status  01/21/2024 >60 >60 mL/min Final    Comment:    (NOTE) Calculated using the CKD-EPI Creatinine Equation (2021)           omeprazole (PRILOSEC) 40 MG capsule      Sig: Take by mouth 2 (two) times daily.     Gastroenterology: Proton Pump Inhibitors Passed - 04/09/2024 11:42 AM      Passed - Valid encounter within last 12 months    Recent Outpatient Visits           1 week ago Primary hypertension   [place] [facility] Herold Hadassah SQUIBB, MD   1 month ago Decubitus ulcer of left hip, stage 2 (HCC)   [place] [facility] Herold Hadassah SQUIBB, MD   1 month ago Decubitus ulcer of left hip, stage 2 (HCC)   [place] [facility] Herold Hadassah SQUIBB, MD   2 months ago Decubitus ulcer of left hip, stage 2 (HCC)   [place] [facility] Herold Hadassah SQUIBB, MD   2 months ago Decubitus ulcer of left hip, stage 2 (HCC)   [place] [facility] Herold Hadassah SQUIBB, MD              Refused Prescriptions Disp Refills   olmesartan  (BENICAR ) 20 MG tablet [Pharmacy Med Name: OLMESARTAN  MEDOXOMIL 20 MG TAB] 90 tablet     Sig: TAKE 1 TABLET BY MOUTH EVERY DAY     Cardiovascular:  Angiotensin Receptor Blockers Failed - 04/09/2024 11:42 AM      Failed - Cr in normal range and within 180 days    Creat  Date Value Ref Range Status  07/30/2022 1.24 (H) 0.50 - 1.03 mg/dL Final   Creatinine, Ser  Date Value Ref Range Status  04/02/2024 1.20 (H) 0.57 - 1.00 mg/dL Final         Passed - K in normal range and within 180 days    Potassium  Date Value Ref Range Status  04/02/2024 3.8 3.5 - 5.2 mmol/L Final         Passed - Patient is not pregnant      Passed - Last BP in normal range    BP Readings from Last 1 Encounters:  04/02/24 116/78         Passed - Valid encounter within last 6 months    Recent Outpatient Visits           1 week ago Primary hypertension   [place] [facility]  Herold Hadassah SQUIBB, MD   1 month ago Decubitus ulcer of left hip, stage 2 (HCC)   [place] [facility] Herold Hadassah SQUIBB, MD   1 month ago Decubitus ulcer of left hip, stage 2 [facility])   [place] [facility] Herold Hadassah SQUIBB, MD   2 months ago Decubitus ulcer of left hip, stage 2 [facility])   [facility] Herold Hadassah SQUIBB, MD   2 months ago Decubitus ulcer of left hip, stage 2 (HCC)   [place] [facility] Herold Hadassah SQUIBB, MD               amLODipine  (NORVASC ) 5 MG tablet [Pharmacy Med Name: AMLODIPINE  BESYLATE 5 MG TAB] 90 tablet     Sig: TAKE 1 TABLET (5 MG TOTAL) BY MOUTH DAILY.     Cardiovascular: Calcium Channel Blockers 2 Failed - 04/09/2024 11:42  AM      Failed - Last Heart Rate in normal range    Pulse Readings from Last 1 Encounters:  04/02/24 (!) 123         Passed - Last BP in normal range    BP Readings from Last 1 Encounters:  04/02/24 116/78         Passed - Valid encounter within last 6 months    Recent Outpatient Visits           1 week ago Primary hypertension   [place] [facility] Herold Hadassah SQUIBB, MD   1 month ago Decubitus ulcer of left hip, stage 2 [facility])   [place] [facility] Herold Hadassah SQUIBB, MD   1 month ago Decubitus ulcer of left hip, stage 2 [facility])   [place] [facility] Herold Hadassah SQUIBB, MD   2 months ago Decubitus ulcer of left hip, stage [facility])   [place] [facility] Herold Hadassah SQUIBB, MD   2 months ago Decubitus ulcer of left hip, stage 2 [place] [facility])   [place] [facility] Herold Hadassah SQUIBB, MD

## 2024-04-09 NOTE — Telephone Encounter (Signed)
 Amlodipine  and Olmesartan  discontinued on 02/13/24.  Requested Prescriptions  Pending Prescriptions Disp Refills   QUEtiapine  (SEROQUEL ) 200 MG tablet [Pharmacy Med Name: QUETIAPINE  FUMARATE 200 MG TAB] 30 tablet 2    Sig: TAKE 1 TABLET BY MOUTH EVERYDAY AT BEDTIME     Not Delegated - Psychiatry:  Antipsychotics - Second Generation (Atypical) - quetiapine  Failed - 04/09/2024 11:30 AM      Failed - This refill cannot be delegated      Failed - TSH in normal range and within 360 days    TSH  Date Value Ref Range Status  04/02/2024 0.219 (L) 0.450 - 4.500 uIU/mL Final         Failed - Last Heart Rate in normal range    Pulse Readings from Last 1 Encounters:  04/02/24 (!) 123         Failed - Lipid Panel in normal range within the last 12 months    Cholesterol, Total  Date Value Ref Range Status  10/03/2021 151 100 - 199 mg/dL Final   Cholesterol  Date Value Ref Range Status  07/30/2022 161 <200 mg/dL Final   LDL Cholesterol (Calc)  Date Value Ref Range Status  07/30/2022 67 mg/dL (calc) Final    Comment:    Reference range: <100 . Desirable range <100 mg/dL for primary prevention;   <70 mg/dL for patients with CHD or diabetic patients  with > or = 2 CHD risk factors. SABRA LDL-C is now calculated using the Martin-Hopkins  calculation, which is a validated novel method providing  better accuracy than the Friedewald equation in the  estimation of LDL-C.  Gladis APPLETHWAITE et al. SANDREA. 7986;689(80): 2061-2068  (http://education.[facility].com/faq/FAQ164)    HDL  Date Value Ref Range Status  07/30/2022 70 > OR = 50 mg/dL Final  95/95/7976 77 >60 mg/dL Final   Triglycerides  Date Value Ref Range Status  07/30/2022 158 (H) <150 mg/dL Final         Failed - CBC within normal limits and completed in the last 12 months    WBC  Date Value Ref Range Status  04/02/2024 7.0 3.4 - 10.8 x10E3/uL Final  01/21/2024 4.6 4.0 - 10.5 K/uL Final   RBC  Date Value Ref Range Status   04/02/2024 3.71 (L) 3.77 - 5.28 x10E6/uL Final  01/21/2024 2.43 (L) 3.87 - 5.11 MIL/uL Final   Hemoglobin  Date Value Ref Range Status  04/02/2024 12.3 11.1 - 15.9 g/dL Final   Hematocrit  Date Value Ref Range Status  04/02/2024 36.3 34.0 - 46.6 % Final   MCHC  Date Value Ref Range Status  04/02/2024 33.9 31.5 - 35.7 g/dL Final  92/77/7974 65.9 30.0 - 36.0 g/dL Final   [facility]  Date Value Ref Range Status  04/02/2024 33.2 (H) 26.6 - 33.0 pg Final  01/21/2024 35.4 (H) 26.0 - 34.0 pg Final   MCV  Date Value Ref Range Status  04/02/2024 98 (H) 79 - 97 fL Final   No results found for: PLTCOUNTKUC, LABPLAT, POCPLA RDW  Date Value Ref Range Status  04/02/2024 12.6 11.7 - 15.4 % Final         Passed - Completed PHQ-2 or PHQ-9 in the last 360 days      Passed - Last BP in normal range    BP Readings from Last 1 Encounters:  04/02/24 116/78         Passed - Valid encounter within last 6 months    Recent Outpatient Visits  1 week ago Primary hypertension   [place] [facility] Herold Hadassah SQUIBB, MD   1 month ago Decubitus ulcer of left hip, stage 2 (HCC)   [place] [facility] Herold Hadassah SQUIBB, MD   1 month ago Decubitus ulcer of left hip, stage 2 (HCC)   [place] [facility] Herold Hadassah SQUIBB, MD   2 months ago Decubitus ulcer of left hip, stage 2 (HCC)   [place] [facility] Herold Hadassah SQUIBB, MD   2 months ago Decubitus ulcer of left hip, stage 2 (HCC)   [place] [facility] Herold Hadassah SQUIBB, MD              Passed - CMP within normal limits and completed in the last 12 months    Albumin  Date Value Ref Range Status  01/14/2024 2.6 (L) 3.5 - 5.0 g/dL Final  92/91/7974 3.5 (L) 3.8 - 4.9 g/dL Final   Alkaline Phosphatase  Date Value Ref Range Status  01/14/2024 82 38 - 126 U/L Final   Alkaline phosphatase (APISO)  Date Value Ref Range Status  07/30/2022 112 37 - 153 U/L  Final   ALT  Date Value Ref Range Status  01/14/2024 34 0 - 44 U/L Final   AST  Date Value Ref Range Status  01/14/2024 70 (H) 15 - 41 U/L Final   BUN  Date Value Ref Range Status  04/02/2024 32 (H) 8 - 27 mg/dL Final   Calcium  Date Value Ref Range Status  04/02/2024 9.6 8.7 - 10.3 mg/dL Final   CO2  Date Value Ref Range Status  04/02/2024 22 20 - 29 mmol/L Final   Creat  Date Value Ref Range Status  07/30/2022 1.24 (H) 0.50 - 1.03 mg/dL Final   Creatinine, Ser  Date Value Ref Range Status  04/02/2024 1.20 (H) 0.57 - 1.00 mg/dL Final   Glucose  Date Value Ref Range Status  04/02/2024 98 70 - 99 mg/dL Final   Glucose, Bld  Date Value Ref Range Status  01/21/2024 93 70 - 99 mg/dL Final    Comment:    Glucose reference range applies only to samples taken after fasting for at least 8 hours.   Potassium  Date Value Ref Range Status  04/02/2024 3.8 3.5 - 5.2 mmol/L Final   Sodium  Date Value Ref Range Status  04/02/2024 136 134 - 144 mmol/L Final   Total Bilirubin  Date Value Ref Range Status  01/14/2024 1.2 0.0 - 1.2 mg/dL Final   Bilirubin Total  Date Value Ref Range Status  01/07/2024 0.5 0.0 - 1.2 mg/dL Final   Protein, ur  Date Value Ref Range Status  01/18/2024 100 (A) NEGATIVE mg/dL Final   Protein,UA  Date Value Ref Range Status  04/02/2024 1+ (A) Negative/Trace Final   Total Protein  Date Value Ref Range Status  01/14/2024 6.3 (L) 6.5 - 8.1 g/dL Final  92/91/7974 8.9 (H) 6.0 - 8.5 g/dL Final   GFR calc Af Amer  Date Value Ref Range Status  01/24/2019 >60 >60 mL/min Final   eGFR  Date Value Ref Range Status  04/02/2024 51 (L) >59 mL/min/1.73 Final   GFR, Estimated  Date Value Ref Range Status  01/21/2024 >60 >60 mL/min Final    Comment:    (NOTE) Calculated using the CKD-EPI Creatinine Equation (2021)           olmesartan  (BENICAR ) 20 MG tablet [Pharmacy Med Name: OLMESARTAN  MEDOXOMIL  20 MG TAB] 90 tablet     Sig: TAKE 1  TABLET BY MOUTH EVERY DAY     Cardiovascular:  Angiotensin Receptor Blockers Failed - 04/09/2024 11:30 AM      Failed - Cr in normal range and within 180 days    Creat  Date Value Ref Range Status  07/30/2022 1.24 (H) 0.50 - 1.03 mg/dL Final   Creatinine, Ser  Date Value Ref Range Status  04/02/2024 1.20 (H) 0.57 - 1.00 mg/dL Final         Passed - K in normal range and within 180 days    Potassium  Date Value Ref Range Status  04/02/2024 3.8 3.5 - 5.2 mmol/L Final         Passed - Patient is not pregnant      Passed - Last BP in normal range    BP Readings from Last 1 Encounters:  04/02/24 116/78         Passed - Valid encounter within last 6 months    Recent Outpatient Visits           1 week ago Primary hypertension   [place] [facility] Herold Hadassah SQUIBB, MD   1 month ago Decubitus ulcer of left hip, stage 2 (HCC)   [place] [facility] Herold Hadassah SQUIBB, MD   1 month ago Decubitus ulcer of left hip, stage 2 [facility])   [place] [facility] Herold Hadassah SQUIBB, MD   2 months ago Decubitus ulcer of left hip, stage 2 [facility])   [place] [facility] Herold Hadassah SQUIBB, MD   2 months ago Decubitus ulcer of left hip, stage 2 (HCC)   [place] [facility] Herold Hadassah SQUIBB, MD               amLODipine  (NORVASC ) 5 MG tablet [Pharmacy Med Name: AMLODIPINE  BESYLATE 5 MG TAB] 90 tablet     Sig: TAKE 1 TABLET (5 MG TOTAL) BY MOUTH DAILY.     Cardiovascular: Calcium Channel Blockers 2 Failed - 04/09/2024 11:30 AM      Failed - Last Heart Rate in normal range    Pulse Readings from Last 1 Encounters:  04/02/24 (!) 123         Passed - Last BP in normal range    BP Readings from Last 1 Encounters:  04/02/24 116/78         Passed - Valid encounter within last 6 months    Recent Outpatient Visits           1 week ago Primary hypertension   [place] [facility] Herold Hadassah SQUIBB, MD   1  month ago Decubitus ulcer of left hip, stage 2 [facility])   [place] [facility] Herold Hadassah SQUIBB, MD   1 month ago Decubitus ulcer of left hip, stage 2 [facility])   [place] [facility] Herold Hadassah SQUIBB, MD   2 months ago Decubitus ulcer of left hip, stage 2 [facility])   [place] [facility] Herold Hadassah SQUIBB, MD   2 months ago Decubitus ulcer of left hip, stage 2 [facility])    [facility] Herold Hadassah SQUIBB, MD               omeprazole (PRILOSEC) 40 MG capsule      Sig: Take by mouth 2 (two) times daily.     Gastroenterology: Proton Pump Inhibitors Passed -  04/09/2024 11:30 AM      Passed - Valid encounter within last 12 months    Recent Outpatient Visits           1 week ago Primary hypertension   [place] [facility] Herold Hadassah SQUIBB, MD   1 month ago Decubitus ulcer of left hip, stage 2 [facility])   [facility] Herold Hadassah SQUIBB, MD   1 month ago Decubitus ulcer of left hip, stage 2 [facility])   [place] [facility] Herold Hadassah SQUIBB, MD   2 months ago Decubitus ulcer of left hip, stage 2 [facility])   [place] [facility] Herold Hadassah SQUIBB, MD   2 months ago Decubitus ulcer of left hip, stage 2 [facility])   [place] [facility] Herold Hadassah SQUIBB, MD

## 2024-04-09 NOTE — Telephone Encounter (Signed)
 Requested medication (s) are due for refill today - provider review   Requested medication (s) are on the active medication list -yes  Future visit scheduled -yes  Last refill: omeprazole- 11/19/23- listed as historical                   Quetiapine - 02/06/24 #30 2RF- non delegated Rx  Notes to clinic: duplicate request- see above  Requested Prescriptions  Pending Prescriptions Disp Refills   QUEtiapine  (SEROQUEL ) 200 MG tablet [Pharmacy Med Name: QUETIAPINE  FUMARATE 200 MG TAB] 30 tablet 2    Sig: TAKE 1 TABLET BY MOUTH EVERYDAY AT BEDTIME     Not Delegated - Psychiatry:  Antipsychotics - Second Generation (Atypical) - quetiapine  Failed - 04/09/2024  1:16 PM      Failed - This refill cannot be delegated      Failed - TSH in normal range and within 360 days    TSH  Date Value Ref Range Status  04/02/2024 0.219 (L) 0.450 - 4.500 uIU/mL Final         Failed - Last Heart Rate in normal range    Pulse Readings from Last 1 Encounters:  04/02/24 (!) 123         Failed - Lipid Panel in normal range within the last 12 months    Cholesterol, Total  Date Value Ref Range Status  10/03/2021 151 100 - 199 mg/dL Final   Cholesterol  Date Value Ref Range Status  07/30/2022 161 <200 mg/dL Final   LDL Cholesterol (Calc)  Date Value Ref Range Status  07/30/2022 67 mg/dL (calc) Final    Comment:    Reference range: <100 . Desirable range <100 mg/dL for primary prevention;   <70 mg/dL for patients with CHD or diabetic patients  with > or = 2 CHD risk factors. SABRA LDL-C is now calculated using the Martin-Hopkins  calculation, which is a validated novel method providing  better accuracy than the Friedewald equation in the  estimation of LDL-C.  Gladis APPLETHWAITE et al. SANDREA. 7986;689(80): 2061-2068  (http://education.[facility].com/faq/FAQ164)    HDL  Date Value Ref Range Status  07/30/2022 70 > OR = 50 mg/dL Final  95/95/7976 77 >60 mg/dL Final   Triglycerides  Date Value Ref Range  Status  07/30/2022 158 (H) <150 mg/dL Final         Failed - CBC within normal limits and completed in the last 12 months    WBC  Date Value Ref Range Status  04/02/2024 7.0 3.4 - 10.8 x10E3/uL Final  01/21/2024 4.6 4.0 - 10.5 K/uL Final   RBC  Date Value Ref Range Status  04/02/2024 3.71 (L) 3.77 - 5.28 x10E6/uL Final  01/21/2024 2.43 (L) 3.87 - 5.11 MIL/uL Final   Hemoglobin  Date Value Ref Range Status  04/02/2024 12.3 11.1 - 15.9 g/dL Final   Hematocrit  Date Value Ref Range Status  04/02/2024 36.3 34.0 - 46.6 % Final   MCHC  Date Value Ref Range Status  04/02/2024 33.9 31.5 - 35.7 g/dL Final  92/77/7974 65.9 30.0 - 36.0 g/dL Final   [facility]  Date Value Ref Range Status  04/02/2024 33.2 (H) 26.6 - 33.0 pg Final  01/21/2024 35.4 (H) 26.0 - 34.0 pg Final   MCV  Date Value Ref Range Status  04/02/2024 98 (H) 79 - 97 fL Final   No results found for: PLTCOUNTKUC, LABPLAT, POCPLA RDW  Date Value Ref Range Status  04/02/2024 12.6 11.7 - 15.4 % Final  Passed - Completed PHQ-2 or PHQ-9 in the last 360 days      Passed - Last BP in normal range    BP Readings from Last 1 Encounters:  04/02/24 116/78         Passed - Valid encounter within last 6 months    Recent Outpatient Visits           1 week ago Primary hypertension   [place] [facility] Herold Hadassah SQUIBB, MD   1 month ago Decubitus ulcer of left hip, stage 2 (HCC)   [place] [facility] Herold Hadassah SQUIBB, MD   1 month ago Decubitus ulcer of left hip, stage 2 (HCC)   [place] [facility] Herold Hadassah SQUIBB, MD   2 months ago Decubitus ulcer of left hip, stage 2 (HCC)   [place] [facility] Herold Hadassah SQUIBB, MD   2 months ago Decubitus ulcer of left hip, stage 2 (HCC)   [place] [facility] Herold Hadassah SQUIBB, MD              Passed - CMP within normal limits and completed in the last 12 months    Albumin  Date Value  Ref Range Status  01/14/2024 2.6 (L) 3.5 - 5.0 g/dL Final  92/91/7974 3.5 (L) 3.8 - 4.9 g/dL Final   Alkaline Phosphatase  Date Value Ref Range Status  01/14/2024 82 38 - 126 U/L Final   Alkaline phosphatase (APISO)  Date Value Ref Range Status  07/30/2022 112 37 - 153 U/L Final   ALT  Date Value Ref Range Status  01/14/2024 34 0 - 44 U/L Final   AST  Date Value Ref Range Status  01/14/2024 70 (H) 15 - 41 U/L Final   BUN  Date Value Ref Range Status  04/02/2024 32 (H) 8 - 27 mg/dL Final   Calcium  Date Value Ref Range Status  04/02/2024 9.6 8.7 - 10.3 mg/dL Final   CO2  Date Value Ref Range Status  04/02/2024 22 20 - 29 mmol/L Final   Creat  Date Value Ref Range Status  07/30/2022 1.24 (H) 0.50 - 1.03 mg/dL Final   Creatinine, Ser  Date Value Ref Range Status  04/02/2024 1.20 (H) 0.57 - 1.00 mg/dL Final   Glucose  Date Value Ref Range Status  04/02/2024 98 70 - 99 mg/dL Final   Glucose, Bld  Date Value Ref Range Status  01/21/2024 93 70 - 99 mg/dL Final    Comment:    Glucose reference range applies only to samples taken after fasting for at least 8 hours.   Potassium  Date Value Ref Range Status  04/02/2024 3.8 3.5 - 5.2 mmol/L Final   Sodium  Date Value Ref Range Status  04/02/2024 136 134 - 144 mmol/L Final   Total Bilirubin  Date Value Ref Range Status  01/14/2024 1.2 0.0 - 1.2 mg/dL Final   Bilirubin Total  Date Value Ref Range Status  01/07/2024 0.5 0.0 - 1.2 mg/dL Final   Protein, ur  Date Value Ref Range Status  01/18/2024 100 (A) NEGATIVE mg/dL Final   Protein,UA  Date Value Ref Range Status  04/02/2024 1+ (A) Negative/Trace Final   Total Protein  Date Value Ref Range Status  01/14/2024 6.3 (L) 6.5 - 8.1 g/dL Final  92/91/7974 8.9 (H) 6.0 - 8.5 g/dL Final   GFR calc Af Amer  Date Value Ref Range Status  01/24/2019 >60 >60 mL/min Final  eGFR  Date Value Ref Range Status  04/02/2024 51 (L) >59 mL/min/1.73 Final   GFR,  Estimated  Date Value Ref Range Status  01/21/2024 >60 >60 mL/min Final    Comment:    (NOTE) Calculated using the CKD-EPI Creatinine Equation (2021)           omeprazole (PRILOSEC) 40 MG capsule      Sig: Take by mouth 2 (two) times daily.     Gastroenterology: Proton Pump Inhibitors Passed - 04/09/2024  1:16 PM      Passed - Valid encounter within last 12 months    Recent Outpatient Visits           1 week ago Primary hypertension   [place] [facility] Herold Hadassah SQUIBB, MD   1 month ago Decubitus ulcer of left hip, stage 2 (HCC)   [place] [facility] Herold Hadassah SQUIBB, MD   1 month ago Decubitus ulcer of left hip, stage 2 (HCC)   [place] [facility] Herold Hadassah SQUIBB, MD   2 months ago Decubitus ulcer of left hip, stage 2 (HCC)   [place] [facility] Herold Hadassah SQUIBB, MD   2 months ago Decubitus ulcer of left hip, stage 2 (HCC)   [place] [facility] Herold Hadassah SQUIBB, MD              Refused Prescriptions Disp Refills   olmesartan  (BENICAR ) 20 MG tablet [Pharmacy Med Name: OLMESARTAN  MEDOXOMIL 20 MG TAB] 90 tablet     Sig: TAKE 1 TABLET BY MOUTH EVERY DAY     Cardiovascular:  Angiotensin Receptor Blockers Failed - 04/09/2024  1:16 PM      Failed - Cr in normal range and within 180 days    Creat  Date Value Ref Range Status  07/30/2022 1.24 (H) 0.50 - 1.03 mg/dL Final   Creatinine, Ser  Date Value Ref Range Status  04/02/2024 1.20 (H) 0.57 - 1.00 mg/dL Final         Passed - K in normal range and within 180 days    Potassium  Date Value Ref Range Status  04/02/2024 3.8 3.5 - 5.2 mmol/L Final         Passed - Patient is not pregnant      Passed - Last BP in normal range    BP Readings from Last 1 Encounters:  04/02/24 116/78         Passed - Valid encounter within last 6 months    Recent Outpatient Visits           1 week ago Primary hypertension   [place] [facility] Herold Hadassah SQUIBB, MD   1 month ago Decubitus ulcer of left hip, stage 2 (HCC)   [place] [facility] Herold Hadassah SQUIBB, MD   1 month ago Decubitus ulcer of left hip, stage 2 [facility])   [place] [facility] Herold Hadassah SQUIBB, MD   2 months ago Decubitus ulcer of left hip, stage 2 [facility])   [place] [facility] Herold Hadassah SQUIBB, MD   2 months ago Decubitus ulcer of left hip, stage 2 (HCC)   [place] [facility] Herold Hadassah SQUIBB, MD               amLODipine  (NORVASC ) 5 MG tablet [Pharmacy Med Name: AMLODIPINE  BESYLATE 5 MG TAB] 90 tablet     Sig: TAKE 1 TABLET (5  MG TOTAL) BY MOUTH DAILY.     Cardiovascular: Calcium Channel Blockers 2 Failed - 04/09/2024  1:16 PM      Failed - Last Heart Rate in normal range    Pulse Readings from Last 1 Encounters:  04/02/24 (!) 123         Passed - Last BP in normal range    BP Readings from Last 1 Encounters:  04/02/24 116/78         Passed - Valid encounter within last 6 months    Recent Outpatient Visits           1 week ago Primary hypertension   [place] [facility] Herold Hadassah SQUIBB, MD   1 month ago Decubitus ulcer of left hip, stage 2 (HCC)   [place] [facility] Herold Hadassah SQUIBB, MD   1 month ago Decubitus ulcer of left hip, stage 2 (HCC)   [place] [facility] Herold Hadassah SQUIBB, MD   2 months ago Decubitus ulcer of left hip, stage 2 (HCC)   [place] [facility] Herold Hadassah SQUIBB, MD   2 months ago Decubitus ulcer of left hip, stage 2 (HCC)   [place] [facility] Herold Hadassah SQUIBB, MD                 Requested Prescriptions  Pending Prescriptions Disp Refills   QUEtiapine  (SEROQUEL ) 200 MG tablet [Pharmacy Med Name: QUETIAPINE  FUMARATE 200 MG TAB] 30 tablet 2    Sig: TAKE 1 TABLET BY MOUTH EVERYDAY AT BEDTIME     Not Delegated - Psychiatry:  Antipsychotics - Second Generation  (Atypical) - quetiapine  Failed - 04/09/2024  1:16 PM      Failed - This refill cannot be delegated      Failed - TSH in normal range and within 360 days    TSH  Date Value Ref Range Status  04/02/2024 0.219 (L) 0.450 - 4.500 uIU/mL Final         Failed - Last Heart Rate in normal range    Pulse Readings from Last 1 Encounters:  04/02/24 (!) 123         Failed - Lipid Panel in normal range within the last 12 months    Cholesterol, Total  Date Value Ref Range Status  10/03/2021 151 100 - 199 mg/dL Final   Cholesterol  Date Value Ref Range Status  07/30/2022 161 <200 mg/dL Final   LDL Cholesterol (Calc)  Date Value Ref Range Status  07/30/2022 67 mg/dL (calc) Final    Comment:    Reference range: <100 . Desirable range <100 mg/dL for primary prevention;   <70 mg/dL for patients with CHD or diabetic patients  with > or = 2 CHD risk factors. SABRA LDL-C is now calculated using the Martin-Hopkins  calculation, which is a validated novel method providing  better accuracy than the Friedewald equation in the  estimation of LDL-C.  Gladis APPLETHWAITE et al. SANDREA. 7986;689(80): 2061-2068  (http://education.[facility].com/faq/FAQ164)    HDL  Date Value Ref Range Status  07/30/2022 70 > OR = 50 mg/dL Final  95/95/7976 77 >60 mg/dL Final   Triglycerides  Date Value Ref Range Status  07/30/2022 158 (H) <150 mg/dL Final         Failed - CBC within normal limits and completed in the last 12 months    WBC  Date Value Ref Range Status  04/02/2024 7.0 3.4 - 10.8 x10E3/uL Final  01/21/2024 4.6  4.0 - 10.5 K/uL Final   RBC  Date Value Ref Range Status  04/02/2024 3.71 (L) 3.77 - 5.28 x10E6/uL Final  01/21/2024 2.43 (L) 3.87 - 5.11 MIL/uL Final   Hemoglobin  Date Value Ref Range Status  04/02/2024 12.3 11.1 - 15.9 g/dL Final   Hematocrit  Date Value Ref Range Status  04/02/2024 36.3 34.0 - 46.6 % Final   MCHC  Date Value Ref Range Status  04/02/2024 33.9 31.5 - 35.7 g/dL  Final  92/77/7974 65.9 30.0 - 36.0 g/dL Final   [facility]  Date Value Ref Range Status  04/02/2024 33.2 (H) 26.6 - 33.0 pg Final  01/21/2024 35.4 (H) 26.0 - 34.0 pg Final   MCV  Date Value Ref Range Status  04/02/2024 98 (H) 79 - 97 fL Final   No results found for: PLTCOUNTKUC, LABPLAT, POCPLA RDW  Date Value Ref Range Status  04/02/2024 12.6 11.7 - 15.4 % Final         Passed - Completed PHQ-2 or PHQ-9 in the last 360 days      Passed - Last BP in normal range    BP Readings from Last 1 Encounters:  04/02/24 116/78         Passed - Valid encounter within last 6 months    Recent Outpatient Visits           1 week ago Primary hypertension   [place] [facility] Herold Hadassah SQUIBB, MD   1 month ago Decubitus ulcer of left hip, stage 2 (HCC)   [place] [facility] Herold Hadassah SQUIBB, MD   1 month ago Decubitus ulcer of left hip, stage 2 (HCC)   [place] [facility] Herold Hadassah SQUIBB, MD   2 months ago Decubitus ulcer of left hip, stage 2 (HCC)   [place] [facility] Herold Hadassah SQUIBB, MD   2 months ago Decubitus ulcer of left hip, stage 2 (HCC)   [place] [facility] Herold Hadassah SQUIBB, MD              Passed - CMP within normal limits and completed in the last 12 months    Albumin  Date Value Ref Range Status  01/14/2024 2.6 (L) 3.5 - 5.0 g/dL Final  92/91/7974 3.5 (L) 3.8 - 4.9 g/dL Final   Alkaline Phosphatase  Date Value Ref Range Status  01/14/2024 82 38 - 126 U/L Final   Alkaline phosphatase (APISO)  Date Value Ref Range Status  07/30/2022 112 37 - 153 U/L Final   ALT  Date Value Ref Range Status  01/14/2024 34 0 - 44 U/L Final   AST  Date Value Ref Range Status  01/14/2024 70 (H) 15 - 41 U/L Final   BUN  Date Value Ref Range Status  04/02/2024 32 (H) 8 - 27 mg/dL Final   Calcium  Date Value Ref Range Status  04/02/2024 9.6 8.7 - 10.3 mg/dL Final   CO2  Date Value Ref  Range Status  04/02/2024 22 20 - 29 mmol/L Final   Creat  Date Value Ref Range Status  07/30/2022 1.24 (H) 0.50 - 1.03 mg/dL Final   Creatinine, Ser  Date Value Ref Range Status  04/02/2024 1.20 (H) 0.57 - 1.00 mg/dL Final   Glucose  Date Value Ref Range Status  04/02/2024 98 70 - 99 mg/dL Final   Glucose, Bld  Date Value Ref Range Status  01/21/2024 93 70 - 99 mg/dL Final  Comment:    Glucose reference range applies only to samples taken after fasting for at least 8 hours.   Potassium  Date Value Ref Range Status  04/02/2024 3.8 3.5 - 5.2 mmol/L Final   Sodium  Date Value Ref Range Status  04/02/2024 136 134 - 144 mmol/L Final   Total Bilirubin  Date Value Ref Range Status  01/14/2024 1.2 0.0 - 1.2 mg/dL Final   Bilirubin Total  Date Value Ref Range Status  01/07/2024 0.5 0.0 - 1.2 mg/dL Final   Protein, ur  Date Value Ref Range Status  01/18/2024 100 (A) NEGATIVE mg/dL Final   Protein,UA  Date Value Ref Range Status  04/02/2024 1+ (A) Negative/Trace Final   Total Protein  Date Value Ref Range Status  01/14/2024 6.3 (L) 6.5 - 8.1 g/dL Final  92/91/7974 8.9 (H) 6.0 - 8.5 g/dL Final   GFR calc Af Amer  Date Value Ref Range Status  01/24/2019 >60 >60 mL/min Final   eGFR  Date Value Ref Range Status  04/02/2024 51 (L) >59 mL/min/1.73 Final   GFR, Estimated  Date Value Ref Range Status  01/21/2024 >60 >60 mL/min Final    Comment:    (NOTE) Calculated using the CKD-EPI Creatinine Equation (2021)           omeprazole (PRILOSEC) 40 MG capsule      Sig: Take by mouth 2 (two) times daily.     Gastroenterology: Proton Pump Inhibitors Passed - 04/09/2024  1:16 PM      Passed - Valid encounter within last 12 months    Recent Outpatient Visits           1 week ago Primary hypertension   [place] [facility] Herold Hadassah SQUIBB, MD   1 month ago Decubitus ulcer of left hip, stage 2 (HCC)   [place] [facility] Herold Hadassah SQUIBB, MD   1 month ago Decubitus ulcer of left hip, stage 2 (HCC)   [place] [facility] Herold Hadassah SQUIBB, MD   2 months ago Decubitus ulcer of left hip, stage 2 (HCC)   [place] [facility] Herold Hadassah SQUIBB, MD   2 months ago Decubitus ulcer of left hip, stage 2 (HCC)   [place] [facility] Herold Hadassah SQUIBB, MD              Refused Prescriptions Disp Refills   olmesartan  (BENICAR ) 20 MG tablet [Pharmacy Med Name: OLMESARTAN  MEDOXOMIL 20 MG TAB] 90 tablet     Sig: TAKE 1 TABLET BY MOUTH EVERY DAY     Cardiovascular:  Angiotensin Receptor Blockers Failed - 04/09/2024  1:16 PM      Failed - Cr in normal range and within 180 days    Creat  Date Value Ref Range Status  07/30/2022 1.24 (H) 0.50 - 1.03 mg/dL Final   Creatinine, Ser  Date Value Ref Range Status  04/02/2024 1.20 (H) 0.57 - 1.00 mg/dL Final         Passed - K in normal range and within 180 days    Potassium  Date Value Ref Range Status  04/02/2024 3.8 3.5 - 5.2 mmol/L Final         Passed - Patient is not pregnant      Passed - Last BP in normal range    BP Readings from Last 1 Encounters:  04/02/24 116/78         Passed - Valid encounter within last  6 months    Recent Outpatient Visits           1 week ago Primary hypertension   [facility] [facility] Herold Hadassah SQUIBB, MD   1 month ago Decubitus ulcer of left hip, stage 2 [facility])   [place] [facility] Herold Hadassah SQUIBB, MD   1 month ago Decubitus ulcer of left hip, stage 2 [facility])    [place] [facility] Herold Hadassah SQUIBB, MD   2 months ago Decubitus ulcer of left hip, stage 2 [facility])   [place] [facility] Herold Hadassah SQUIBB, MD   2 months ago Decubitus ulcer of left hip, stage 2 (HCC)   [place] [facility] Herold Hadassah SQUIBB, MD               amLODipine  (NORVASC ) 5 MG tablet [Pharmacy Med Name: AMLODIPINE  BESYLATE 5 MG TAB] 90 tablet      Sig: TAKE 1 TABLET (5 MG TOTAL) BY MOUTH DAILY.     Cardiovascular: Calcium Channel Blockers 2 Failed - 04/09/2024  1:16 PM      Failed - Last Heart Rate in normal range    Pulse Readings from Last 1 Encounters:  04/02/24 (!) 123         Passed - Last BP in normal range    BP Readings from Last 1 Encounters:  04/02/24 116/78         Passed - Valid encounter within last 6 months    Recent Outpatient Visits           1 week ago Primary hypertension   [place] [facility] Herold Hadassah SQUIBB, MD   1 month ago Decubitus ulcer of left hip, stage 2 [facility])   [place] [facility] Herold Hadassah SQUIBB, MD   1 month ago Decubitus ulcer of left hip, stage 2 [facility])   [place] [facility] Herold Hadassah SQUIBB, MD   2 months ago Decubitus ulcer of left hip, stage 2 [facility])   [place] [facility] Herold Hadassah SQUIBB, MD   2 months ago Decubitus ulcer of left hip, stage 2 [facility])   [place] [facility] Herold Hadassah SQUIBB, MD

## 2024-04-10 MED ORDER — OMEPRAZOLE 40 MG PO CPDR: 40.0000 mg | DELAYED_RELEASE_CAPSULE | Freq: Two times a day (BID) | ORAL | 1 refills | Status: AC

## 2024-04-14 ENCOUNTER — Ambulatory Visit: Admitting: Physician Assistant

## 2024-04-16 ENCOUNTER — Ambulatory Visit: Admitting: Pediatrics

## 2024-04-22 ENCOUNTER — Other Ambulatory Visit: Payer: Self-pay

## 2024-04-22 ENCOUNTER — Emergency Department

## 2024-04-22 ENCOUNTER — Encounter: Payer: Self-pay | Admitting: Intensive Care

## 2024-04-22 ENCOUNTER — Inpatient Hospital Stay
Admission: EM | Admit: 2024-04-22 | Discharge: 2024-04-24 | DRG: 304 | Disposition: A | Discharge status: Home / Self Care | Attending: Internal Medicine | Admitting: Internal Medicine

## 2024-04-22 DIAGNOSIS — F17210 Nicotine dependence, cigarettes, uncomplicated: Secondary | ICD-10-CM | POA: Diagnosis present

## 2024-04-22 DIAGNOSIS — F419 Anxiety disorder, unspecified: Secondary | ICD-10-CM | POA: Diagnosis present

## 2024-04-22 DIAGNOSIS — Z8261 Family history of arthritis: Secondary | ICD-10-CM | POA: Diagnosis not present

## 2024-04-22 DIAGNOSIS — Z8249 Family history of ischemic heart disease and other diseases of the circulatory system: Secondary | ICD-10-CM | POA: Diagnosis not present

## 2024-04-22 DIAGNOSIS — Z823 Family history of stroke: Secondary | ICD-10-CM

## 2024-04-22 DIAGNOSIS — Z832 Family history of diseases of the blood and blood-forming organs and certain disorders involving the immune mechanism: Secondary | ICD-10-CM

## 2024-04-22 DIAGNOSIS — J449 Chronic obstructive pulmonary disease, unspecified: Secondary | ICD-10-CM | POA: Diagnosis not present

## 2024-04-22 DIAGNOSIS — Z8041 Family history of malignant neoplasm of ovary: Secondary | ICD-10-CM | POA: Diagnosis not present

## 2024-04-22 DIAGNOSIS — F10939 Alcohol use, unspecified with withdrawal, unspecified: Secondary | ICD-10-CM | POA: Diagnosis present

## 2024-04-22 DIAGNOSIS — M419 Scoliosis, unspecified: Secondary | ICD-10-CM | POA: Diagnosis present

## 2024-04-22 DIAGNOSIS — Z79899 Other long term (current) drug therapy: Secondary | ICD-10-CM

## 2024-04-22 DIAGNOSIS — R569 Unspecified convulsions: Secondary | ICD-10-CM | POA: Diagnosis not present

## 2024-04-22 DIAGNOSIS — F319 Bipolar disorder, unspecified: Secondary | ICD-10-CM | POA: Diagnosis not present

## 2024-04-22 DIAGNOSIS — F1191 Opioid use, unspecified, in remission: Secondary | ICD-10-CM | POA: Diagnosis not present

## 2024-04-22 DIAGNOSIS — E871 Hypo-osmolality and hyponatremia: Secondary | ICD-10-CM | POA: Diagnosis not present

## 2024-04-22 DIAGNOSIS — J4489 Other specified chronic obstructive pulmonary disease: Secondary | ICD-10-CM | POA: Diagnosis present

## 2024-04-22 DIAGNOSIS — Z820 Family history of epilepsy and other diseases of the nervous system: Secondary | ICD-10-CM

## 2024-04-22 DIAGNOSIS — N200 Calculus of kidney: Secondary | ICD-10-CM | POA: Diagnosis not present

## 2024-04-22 DIAGNOSIS — Z8349 Family history of other endocrine, nutritional and metabolic diseases: Secondary | ICD-10-CM

## 2024-04-22 DIAGNOSIS — R000 Tachycardia, unspecified: Secondary | ICD-10-CM | POA: Diagnosis not present

## 2024-04-22 DIAGNOSIS — F1026 Alcohol dependence with alcohol-induced persisting amnestic disorder: Secondary | ICD-10-CM | POA: Diagnosis present

## 2024-04-22 DIAGNOSIS — B1920 Unspecified viral hepatitis C without hepatic coma: Secondary | ICD-10-CM | POA: Diagnosis present

## 2024-04-22 DIAGNOSIS — M549 Dorsalgia, unspecified: Secondary | ICD-10-CM | POA: Insufficient documentation

## 2024-04-22 DIAGNOSIS — I10 Essential (primary) hypertension: Secondary | ICD-10-CM | POA: Diagnosis not present

## 2024-04-22 DIAGNOSIS — I160 Hypertensive urgency: Secondary | ICD-10-CM | POA: Diagnosis not present

## 2024-04-22 DIAGNOSIS — G9341 Metabolic encephalopathy: Secondary | ICD-10-CM | POA: Diagnosis not present

## 2024-04-22 DIAGNOSIS — Z818 Family history of other mental and behavioral disorders: Secondary | ICD-10-CM

## 2024-04-22 DIAGNOSIS — K8689 Other specified diseases of pancreas: Secondary | ICD-10-CM | POA: Diagnosis not present

## 2024-04-22 DIAGNOSIS — I161 Hypertensive emergency: Principal | ICD-10-CM | POA: Diagnosis present

## 2024-04-22 DIAGNOSIS — I5032 Chronic diastolic (congestive) heart failure: Secondary | ICD-10-CM | POA: Diagnosis not present

## 2024-04-22 DIAGNOSIS — F1410 Cocaine abuse, uncomplicated: Secondary | ICD-10-CM | POA: Diagnosis present

## 2024-04-22 DIAGNOSIS — R918 Other nonspecific abnormal finding of lung field: Secondary | ICD-10-CM | POA: Diagnosis not present

## 2024-04-22 DIAGNOSIS — I700 Atherosclerosis of aorta: Secondary | ICD-10-CM | POA: Diagnosis not present

## 2024-04-22 DIAGNOSIS — N179 Acute kidney failure, unspecified: Secondary | ICD-10-CM | POA: Diagnosis not present

## 2024-04-22 DIAGNOSIS — Z7951 Long term (current) use of inhaled steroids: Secondary | ICD-10-CM

## 2024-04-22 DIAGNOSIS — Z90710 Acquired absence of both cervix and uterus: Secondary | ICD-10-CM

## 2024-04-22 DIAGNOSIS — M1990 Unspecified osteoarthritis, unspecified site: Secondary | ICD-10-CM | POA: Diagnosis present

## 2024-04-22 DIAGNOSIS — Z789 Other specified health status: Secondary | ICD-10-CM | POA: Diagnosis not present

## 2024-04-22 DIAGNOSIS — I110 Hypertensive heart disease with heart failure: Secondary | ICD-10-CM | POA: Diagnosis not present

## 2024-04-22 DIAGNOSIS — E876 Hypokalemia: Secondary | ICD-10-CM | POA: Diagnosis not present

## 2024-04-22 DIAGNOSIS — F1111 Opioid abuse, in remission: Secondary | ICD-10-CM | POA: Diagnosis not present

## 2024-04-22 DIAGNOSIS — J439 Emphysema, unspecified: Secondary | ICD-10-CM | POA: Diagnosis not present

## 2024-04-22 DIAGNOSIS — F17200 Nicotine dependence, unspecified, uncomplicated: Secondary | ICD-10-CM | POA: Diagnosis present

## 2024-04-22 DIAGNOSIS — Z8659 Personal history of other mental and behavioral disorders: Secondary | ICD-10-CM

## 2024-04-22 DIAGNOSIS — R6883 Chills (without fever): Secondary | ICD-10-CM | POA: Diagnosis not present

## 2024-04-22 DIAGNOSIS — R531 Weakness: Secondary | ICD-10-CM | POA: Diagnosis not present

## 2024-04-22 DIAGNOSIS — R911 Solitary pulmonary nodule: Secondary | ICD-10-CM | POA: Diagnosis not present

## 2024-04-22 DIAGNOSIS — F418 Other specified anxiety disorders: Secondary | ICD-10-CM | POA: Diagnosis present

## 2024-04-22 DIAGNOSIS — R0602 Shortness of breath: Secondary | ICD-10-CM | POA: Diagnosis not present

## 2024-04-22 DIAGNOSIS — F10931 Alcohol use, unspecified with withdrawal delirium: Secondary | ICD-10-CM | POA: Diagnosis not present

## 2024-04-22 DIAGNOSIS — Z8262 Family history of osteoporosis: Secondary | ICD-10-CM

## 2024-04-22 DIAGNOSIS — R059 Cough, unspecified: Secondary | ICD-10-CM | POA: Diagnosis not present

## 2024-04-22 DIAGNOSIS — R4182 Altered mental status, unspecified: Secondary | ICD-10-CM | POA: Diagnosis not present

## 2024-04-22 LAB — URINALYSIS, W/ REFLEX TO CULTURE (INFECTION SUSPECTED)
Bacteria, UA: NONE SEEN
Bilirubin Urine: NEGATIVE
Glucose, UA: NEGATIVE mg/dL
Hgb urine dipstick: NEGATIVE
Ketones, ur: NEGATIVE mg/dL
Leukocytes,Ua: NEGATIVE
Nitrite: NEGATIVE
Protein, ur: NEGATIVE mg/dL
RBC / HPF: 0 RBC/hpf (ref 0–5)
Specific Gravity, Urine: 1.008 (ref 1.005–1.030)
pH: 7 (ref 5.0–8.0)

## 2024-04-22 LAB — COMPREHENSIVE METABOLIC PANEL WITH GFR
ALT: 50 U/L — ABNORMAL HIGH (ref 0–44)
AST: 82 U/L — ABNORMAL HIGH (ref 15–41)
Albumin: 3.7 g/dL (ref 3.5–5.0)
Alkaline Phosphatase: 155 U/L — ABNORMAL HIGH (ref 38–126)
Anion gap: 11 (ref 5–15)
BUN: 14 mg/dL (ref 8–23)
CO2: 21 mmol/L — ABNORMAL LOW (ref 22–32)
Calcium: 9.7 mg/dL (ref 8.9–10.3)
Chloride: 104 mmol/L (ref 98–111)
Creatinine, Ser: 0.96 mg/dL (ref 0.44–1.00)
GFR, Estimated: >60 mL/min (ref >60)
Glucose, Bld: 112 mg/dL — ABNORMAL HIGH (ref 70–99)
Potassium: 3.8 mmol/L (ref 3.5–5.1)
Sodium: 136 mmol/L (ref 135–145)
Total Bilirubin: 0.8 mg/dL (ref 0.0–1.2)
Total Protein: 8.2 g/dL — ABNORMAL HIGH (ref 6.5–8.1)

## 2024-04-22 LAB — URINE DRUG SCREEN, QUALITATIVE (ARMC ONLY)
Amphetamines, Ur Screen: NOT DETECTED
Barbiturates, Ur Screen: NOT DETECTED
Benzodiazepine, Ur Scrn: NOT DETECTED
Cannabinoid 50 Ng, Ur ~~LOC~~: NOT DETECTED
Cocaine Metabolite,Ur ~~LOC~~: NOT DETECTED
MDMA (Ecstasy)Ur Screen: NOT DETECTED
Methadone Scn, Ur: NOT DETECTED
Opiate, Ur Screen: NOT DETECTED
Phencyclidine (PCP) Ur S: NOT DETECTED
Tricyclic, Ur Screen: NOT DETECTED

## 2024-04-22 LAB — TROPONIN I (HIGH SENSITIVITY)
Troponin I (High Sensitivity): 8 ng/L (ref <18)
Troponin I (High Sensitivity): 23 ng/L — ABNORMAL HIGH (ref <18)

## 2024-04-22 LAB — LACTIC ACID, PLASMA
Lactic Acid, Venous: 1.2 mmol/L (ref 0.5–1.9)
Lactic Acid, Venous: 1 mmol/L (ref 0.5–1.9)

## 2024-04-22 LAB — CBC WITH DIFFERENTIAL/PLATELET
Abs Immature Granulocytes: 0.03 K/uL (ref 0.00–0.07)
Basophils Absolute: 0.1 K/uL (ref 0.0–0.1)
Basophils Relative: 1 %
Eosinophils Absolute: 0.2 K/uL (ref 0.0–0.5)
Eosinophils Relative: 3 %
HCT: 39.9 % (ref 36.0–46.0)
Hemoglobin: 13.6 g/dL (ref 12.0–15.0)
Immature Granulocytes: 0 %
Lymphocytes Relative: 30 %
Lymphs Abs: 2 K/uL (ref 0.7–4.0)
MCH: 32.7 pg (ref 26.0–34.0)
MCHC: 34.1 g/dL (ref 30.0–36.0)
MCV: 95.9 fL (ref 80.0–100.0)
Monocytes Absolute: 0.7 K/uL (ref 0.1–1.0)
Monocytes Relative: 11 %
Neutro Abs: 3.7 K/uL (ref 1.7–7.7)
Neutrophils Relative %: 55 %
Platelets: 241 K/uL (ref 150–400)
RBC: 4.16 MIL/uL (ref 3.87–5.11)
RDW: 13.7 % (ref 11.5–15.5)
WBC: 6.7 K/uL (ref 4.0–10.5)
nRBC: 0 % (ref 0.0–0.2)

## 2024-04-22 LAB — MAGNESIUM: Magnesium: 1.3 mg/dL — ABNORMAL LOW (ref 1.7–2.4)

## 2024-04-22 LAB — BLOOD GAS, VENOUS
Acid-Base Excess: 0.8 mmol/L (ref 0.0–2.0)
Bicarbonate: 25.3 mmol/L (ref 20.0–28.0)
O2 Saturation: 53 %
Patient temperature: 37
pCO2, Ven: 39 mmHg — ABNORMAL LOW (ref 44–60)
pH, Ven: 7.42 (ref 7.25–7.43)

## 2024-04-22 LAB — ETHANOL: Alcohol, Ethyl (B): <15 mg/dL (ref <15)

## 2024-04-22 LAB — CBG MONITORING, ED: Glucose-Capillary: 120 mg/dL — ABNORMAL HIGH (ref 70–99)

## 2024-04-22 LAB — PHOSPHORUS: Phosphorus: 2.5 mg/dL (ref 2.5–4.6)

## 2024-04-22 LAB — BRAIN NATRIURETIC PEPTIDE: B Natriuretic Peptide: 315 pg/mL — ABNORMAL HIGH (ref 0.0–100.0)

## 2024-04-22 MED ORDER — LABETALOL HCL 5 MG/ML IV SOLN
10.0000 mg | Freq: Once | INTRAVENOUS | Status: AC
  Administered 2024-04-22: 10 mg via INTRAVENOUS
  Filled 2024-04-22: qty 4

## 2024-04-22 MED ORDER — THIAMINE HCL 100 MG/ML IJ SOLN: 100.0000 mg | Freq: Every day | INTRAMUSCULAR | Status: DC

## 2024-04-22 MED ORDER — LORAZEPAM 2 MG/ML IJ SOLN: 0.0000 mg | Freq: Two times a day (BID) | INTRAMUSCULAR | Status: DC

## 2024-04-22 MED ORDER — THIAMINE MONONITRATE 100 MG PO TABS
100.0000 mg | ORAL_TABLET | Freq: Every day | ORAL | Status: DC
  Administered 2024-04-23 – 2024-04-24 (×2): 100 mg via ORAL
  Filled 2024-04-22 (×2): qty 1

## 2024-04-22 MED ORDER — HYDRALAZINE HCL 20 MG/ML IJ SOLN: 10.0000 mg | INTRAMUSCULAR | Status: DC | PRN

## 2024-04-22 MED ORDER — IBUPROFEN 400 MG PO TABS
400.0000 mg | ORAL_TABLET | Freq: Four times a day (QID) | ORAL | Status: DC | PRN
  Administered 2024-04-23 – 2024-04-24 (×2): 400 mg via ORAL
  Filled 2024-04-22 (×2): qty 1

## 2024-04-22 MED ORDER — IPRATROPIUM-ALBUTEROL 0.5-2.5 (3) MG/3ML IN SOLN
6.0000 mL | Freq: Once | RESPIRATORY_TRACT | Status: AC
  Administered 2024-04-22: 6 mL via RESPIRATORY_TRACT
  Filled 2024-04-22: qty 6

## 2024-04-22 MED ORDER — ONDANSETRON HCL 4 MG/2ML IJ SOLN: 4.0000 mg | Freq: Three times a day (TID) | INTRAMUSCULAR | Status: DC | PRN

## 2024-04-22 MED ORDER — FOLIC ACID 1 MG PO TABS
1.0000 mg | ORAL_TABLET | Freq: Every day | ORAL | Status: DC
  Administered 2024-04-23 – 2024-04-24 (×2): 1 mg via ORAL
  Filled 2024-04-22 (×2): qty 1

## 2024-04-22 MED ORDER — IOHEXOL 350 MG/ML SOLN
100.0000 mL | Freq: Once | INTRAVENOUS | Status: AC | PRN
  Administered 2024-04-22: 100 mL via INTRAVENOUS

## 2024-04-22 MED ORDER — NICOTINE 21 MG/24HR TD PT24
21.0000 mg | MEDICATED_PATCH | Freq: Every day | TRANSDERMAL | Status: DC
  Administered 2024-04-23: 21 mg via TRANSDERMAL
  Filled 2024-04-22: qty 1

## 2024-04-22 MED ORDER — LORAZEPAM 1 MG PO TABS: 1.0000 mg | ORAL_TABLET | ORAL | Status: DC | PRN

## 2024-04-22 MED ORDER — DM-GUAIFENESIN ER 30-600 MG PO TB12: 1.0000 | ORAL_TABLET | Freq: Two times a day (BID) | ORAL | Status: DC | PRN

## 2024-04-22 MED ORDER — ENOXAPARIN SODIUM 40 MG/0.4ML IJ SOSY
40.0000 mg | PREFILLED_SYRINGE | INTRAMUSCULAR | Status: DC
  Administered 2024-04-23: 40 mg via SUBCUTANEOUS
  Filled 2024-04-22 (×2): qty 0.4

## 2024-04-22 MED ORDER — LORAZEPAM 2 MG/ML IJ SOLN
2.0000 mg | Freq: Once | INTRAMUSCULAR | Status: AC
  Administered 2024-04-22: 2 mg via INTRAVENOUS
  Filled 2024-04-22: qty 1

## 2024-04-22 MED ORDER — NICARDIPINE HCL IN NACL 20-0.86 MG/200ML-% IV SOLN
3.0000 mg/h | INTRAVENOUS | Status: DC
  Administered 2024-04-22: 5 mg/h via INTRAVENOUS
  Filled 2024-04-22 (×2): qty 200

## 2024-04-22 MED ORDER — ALBUTEROL SULFATE (2.5 MG/3ML) 0.083% IN NEBU: 2.5000 mg | INHALATION_SOLUTION | RESPIRATORY_TRACT | Status: DC | PRN

## 2024-04-22 MED ORDER — PHENOBARBITAL SODIUM 65 MG/ML IJ SOLN: 32.5000 mg | Freq: Three times a day (TID) | INTRAMUSCULAR | Status: DC

## 2024-04-22 MED ORDER — LORAZEPAM 2 MG/ML IJ SOLN
0.0000 mg | Freq: Four times a day (QID) | INTRAMUSCULAR | Status: DC
  Administered 2024-04-23: 2 mg via INTRAVENOUS

## 2024-04-22 MED ORDER — PHENOBARBITAL SODIUM 130 MG/ML IJ SOLN
97.5000 mg | Freq: Three times a day (TID) | INTRAMUSCULAR | Status: DC
  Administered 2024-04-23 – 2024-04-24 (×4): 97.5 mg via INTRAVENOUS
  Filled 2024-04-22 (×4): qty 1

## 2024-04-22 MED ORDER — LORAZEPAM 2 MG/ML IJ SOLN: 1.0000 mg | INTRAMUSCULAR | Status: DC | PRN

## 2024-04-22 MED ORDER — ADULT MULTIVITAMIN W/MINERALS CH
1.0000 | ORAL_TABLET | Freq: Every day | ORAL | Status: DC
  Administered 2024-04-23 – 2024-04-24 (×2): 1 via ORAL
  Filled 2024-04-22 (×2): qty 1

## 2024-04-22 MED ORDER — LORAZEPAM 2 MG/ML IJ SOLN
1.0000 mg | INTRAMUSCULAR | Status: DC | PRN
  Filled 2024-04-22: qty 1

## 2024-04-22 MED ORDER — ONDANSETRON HCL 4 MG/2ML IJ SOLN
4.0000 mg | Freq: Once | INTRAMUSCULAR | Status: AC
  Administered 2024-04-22: 4 mg via INTRAVENOUS
  Filled 2024-04-22: qty 2

## 2024-04-22 MED ORDER — PHENOBARBITAL SODIUM 65 MG/ML IJ SOLN: 65.0000 mg | Freq: Three times a day (TID) | INTRAMUSCULAR | Status: DC

## 2024-04-22 MED ORDER — LORAZEPAM 2 MG/ML IJ SOLN
1.0000 mg | Freq: Once | INTRAMUSCULAR | Status: AC
  Administered 2024-04-22: 1 mg via INTRAVENOUS
  Filled 2024-04-22: qty 1

## 2024-04-22 MED ORDER — SODIUM CHLORIDE 0.9 % IV SOLN
260.0000 mg | Freq: Once | INTRAVENOUS | Status: AC
  Administered 2024-04-22: 260 mg via INTRAVENOUS
  Filled 2024-04-22: qty 2

## 2024-04-22 MED ORDER — BUPRENORPHINE HCL-NALOXONE HCL 8-2 MG SL SUBL
1.0000 | SUBLINGUAL_TABLET | Freq: Once | SUBLINGUAL | Status: AC
  Administered 2024-04-22: 1 via SUBLINGUAL
  Filled 2024-04-22: qty 1

## 2024-04-22 NOTE — ED Notes (Signed)
 Patient keeps having intermittent uncontrollable symptoms of stiffening her body

## 2024-04-22 NOTE — ED Triage Notes (Incomplete)
 Arrived by [facility] From home. C/o chills/cold sweats, mucous, and hypertension. Reports feeling dizzy. Patient reports taking blood pressure medicine today. Started feeling bad around 4:30pm today.    Takes suboxene   EMS vitals: 244/134 b/p 86HR 98% RA 97.1oral

## 2024-04-22 NOTE — ED Notes (Signed)
 Pt's HR will jump to the 140's and pt starts yelling out in pain in her legs. Pt states that she took her suboxone  today, denies alcohol use. Pt is A&Ox4.

## 2024-04-22 NOTE — ED Notes (Signed)
 Pt placed on bedpan. Once finished bedpan removed and peri care provide.

## 2024-04-22 NOTE — ED Notes (Signed)
 Pt cleaned of urine, bed linen changed with Kayla, RN. Pt more agitated, won't hold still, and more altered.

## 2024-04-22 NOTE — ED Provider Notes (Signed)
 [facility] Provider Note   Event Date/Time   First MD Initiated Contact with Patient 04/22/24 1811     (approximate) History  Hypertension  HPI Nicole [name] is a 63 y.o. female with a past medical history of polysubstance abuse, hypertension, COPD, GERD, and bipolar disorder who presents with multiple complaints including shortness of breath, muscular spasms, hypertension, and productive cough.  Patient states she is also having shaking chills.  Patient was found to be significantly hypertensive in triage with BP of 236/134. ROS: Patient currently denies any vision changes, tinnitus, difficulty speaking, facial droop, chest pain, abdominal pain, nausea/vomiting/diarrhea, dysuria, or weakness/numbness/paresthesias in any extremity   Physical Exam  Triage Vital Signs: ED Triage Vitals [04/22/24 1802]  Encounter Vitals Group     BP (!) 241/121     Girls Systolic BP Percentile      Girls Diastolic BP Percentile      Boys Systolic BP Percentile      Boys Diastolic BP Percentile      Pulse Rate 85     Resp 18     Temp 97.9 F (36.6 C)     Temp Source Oral     SpO2 97 %     Weight      Height      Head Circumference      Peak Flow      Pain Score      Pain Loc      Pain Education      Exclude from Growth Chart    Most recent vital signs: Vitals:   04/22/24 2045 04/22/24 2055  BP: (!) 198/162 (!) 192/112  Pulse: (!) 127 (!) 144  Resp: 20 (!) 31  Temp:    SpO2: 100% 98%   General: Awake, oriented x4. CV:  Good peripheral perfusion. Resp:  Normal effort.  Inspiratory and extra wheezing over bilateral lung fields Abd:  No distention. Other:  Middle-aged well-developed, well-nourished Caucasian female coughing with posttussive gagging without any emesis.  Patient has episodes of muscular tightening that occur approximately every 5 minutes, last less than 10 seconds, and resolved spontaneously.  Patient is conscious during these episodes ED  Results / Procedures / Treatments  Labs (all labs ordered are listed, but only abnormal results are displayed) Labs Reviewed  BLOOD GAS, VENOUS - Abnormal; Notable for the following components:      Result Value   pCO2, Ven 39 (*)    All other components within normal limits  URINALYSIS, W/ REFLEX TO CULTURE (INFECTION SUSPECTED) - Abnormal; Notable for the following components:   Color, Urine STRAW (*)    APPearance CLEAR (*)    All other components within normal limits  COMPREHENSIVE METABOLIC PANEL WITH GFR - Abnormal; Notable for the following components:   CO2 21 (*)    Glucose, Bld 112 (*)    Total Protein 8.2 (*)    AST 82 (*)    ALT 50 (*)    Alkaline Phosphatase 155 (*)    All other components within normal limits  BRAIN NATRIURETIC PEPTIDE - Abnormal; Notable for the following components:   B Natriuretic Peptide 315.0 (*)    All other components within normal limits  CBG MONITORING, ED - Abnormal; Notable for the following components:   Glucose-Capillary 120 (*)    All other components within normal limits  CBC WITH DIFFERENTIAL/PLATELET  LACTIC ACID, PLASMA  ETHANOL  URINE DRUG SCREEN, QUALITATIVE ([facility] ONLY)  LACTIC ACID, PLASMA  TROPONIN  I (HIGH SENSITIVITY)  TROPONIN I (HIGH SENSITIVITY)   EKG ED ECG REPORT I, Artist MARLA Kerns, the attending physician, personally viewed and interpreted this ECG. Date: 04/22/2024 EKG Time: 1805 Rate: 84 Rhythm: normal sinus rhythm QRS Axis: normal Intervals: normal ST/T Wave abnormalities: normal Narrative Interpretation: no evidence of acute ischemia RADIOLOGY ED MD interpretation: One-view portable chest x-ray interpreted by me shows no evidence of acute abnormalities including no pneumonia, pneumothorax, or widened mediastinum CT angiography of the chest/abdomen/pelvis shows no evidence of acute abnormalities - All radiology independently interpreted and agree with radiology assessment Official radiology report(s): CT  Angio Chest/Abd/Pel for Dissection W and/or W/WO Result Date: 04/22/2024 CLINICAL DATA:  Intermittent uncontrollable symptoms of stiffening shortness of breath EXAM: CT ANGIOGRAPHY CHEST, ABDOMEN AND PELVIS TECHNIQUE: Non-contrast CT of the chest was initially obtained. Multidetector CT imaging through the chest, abdomen and pelvis was performed using the standard protocol during bolus administration of intravenous contrast. Multiplanar reconstructed images and MIPs were obtained and reviewed to evaluate the vascular anatomy. RADIATION DOSE REDUCTION: This exam was performed according to the departmental dose-optimization program which includes automated exposure control, adjustment of the mA and/or kV according to patient size and/or use of iterative reconstruction technique. CONTRAST:  OMNIPAQUE  IOHEXOL  350 MG/ML SOLN COMPARISON:  Chest x-ray 04/22/2024, chest CT 02/22/2022 FINDINGS: CTA CHEST FINDINGS Cardiovascular: Non contrasted images of the chest demonstrate no acute intramural hematoma. Negative for aneurysm or dissection. Common origin of the left common carotid and right brachiocephalic arteries. Mild coronary vascular calcification. Normal cardiac size. No pericardial effusion thickened appearance of the left ventricle. Mediastinum/Nodes: Patent trachea. No thyroid mass. No suspicious lymph nodes. Esophagus within normal limits. Lungs/Pleura: Emphysema. No acute confluent airspace disease. Mild subpleural reticulation as was seen on the prior exam. Scattered small pulmonary nodules many of which are stable, for example punctate 2 mm left upper lobe subpleural nodule on series 6, image 50, previously 2 mm. Subpleural left lower lobe 4 mm pulmonary nodule on series 6, image 89, previously 5 mm. Subpleural right lower lobe pulmonary nodule measuring 5 mm on series 6, image 89, previously 5 mm. New or not definitively seen right apical slightly irregular pulmonary nodule measuring 8 by 5 mm on series  6, image 31. Musculoskeletal: Sternum appears intact. No acute osseous abnormality. Review of the MIP images confirms the above findings. CTA ABDOMEN AND PELVIS FINDINGS VASCULAR Aorta: Normal caliber aorta without aneurysm, dissection, vasculitis or significant stenosis. Moderate aortic atherosclerosis Celiac: Patent without evidence of aneurysm, dissection, vasculitis or significant stenosis. SMA: Mild narrowing at the origin but widely patent distally. Renals: Both renal arteries are patent without evidence of aneurysm, dissection, vasculitis, fibromuscular dysplasia or significant stenosis. IMA: Patent without evidence of aneurysm, dissection, vasculitis or significant stenosis. Inflow: Patent without evidence of aneurysm, dissection, vasculitis or significant stenosis. Veins: Suboptimally assessed Review of the MIP images confirms the above findings. NON-VASCULAR Hepatobiliary: No focal hepatic abnormality. No calcified gallstones. Possible gallbladder wall thickening, series 5 image 155. Prominent common bile duct to the head of pancreas measuring up to 8 mm. No intra hepatic biliary dilatation Pancreas: Slightly prominent pancreatic duct.  No inflammation Spleen: Borderline enlarged at 12 cm craniocaudal. Heterogenous hypodense areas presumably due to phase of arterial enhancement. Adrenals/Urinary Tract: Adrenal glands are normal. Atrophic left kidney with scarring. No hydronephrosis. Small stone lower pole left kidney. The bladder is unremarkable Stomach/Bowel: Fall the stomach is within normal limits. There is no dilated small bowel. No acute bowel wall thickening. Negative  appendix. Lymphatic: No suspicious lymph nodes. Reproductive: Hysterectomy.  No adnexal mass Other: . negative for pelvic effusion or free air Musculoskeletal: Levoscoliosis and degenerative changes. No acute osseous abnormality Review of the MIP images confirms the above findings. IMPRESSION: 1. Negative for acute aortic dissection or  aneurysm. 2. Emphysema with mild subpleural reticulation/mild interstitial disease as was seen on the prior exam. 3. Multiple small pulmonary nodules, most of which are stable. There is a new or not definitively seen right apical pulmonary nodule measuring 8 x 5 mm. Non-contrast chest CT at 6-12 months is recommended. If the nodule is stable at time of repeat CT, then future CT at 18-24 months (from today's scan) is considered optional for low-risk patients, but is recommended for high-risk patients. This recommendation follows the consensus statement: Guidelines for Management of Incidental Pulmonary Nodules Detected on CT Images: From the Fleischner Society 2017; Radiology 2017; 284:228-243. 4. Possible gallbladder wall thickening, no calcified stones by CT. Ultrasound follow-up as indicated. Prominent common bile duct to the head of pancreas measuring up to 8 mm. Correlate with LFTs with follow-up MRCP as indicated. 5. Atrophic left kidney with scarring. Nonobstructing left kidney stone. 6. Thickened appearance of the left ventricle which may be correlated with echocardiography 7. Aortic atherosclerosis. Aortic Atherosclerosis (ICD10-I70.0) and Emphysema (ICD10-J43.9). Electronically Signed   By: Luke Bun M.D.   On: 04/22/2024 21:59   DG Chest Port 1 View Result Date: 04/22/2024 CLINICAL DATA:  Shortness of breath and cough EXAM: PORTABLE CHEST 1 VIEW COMPARISON:  01/18/2024, CT chest 02/22/2022 FINDINGS: Emphysema and chronic appearing interstitial opacities. No acute airspace disease or effusion. Stable cardiomediastinal silhouette with aortic atherosclerosis IMPRESSION: No active disease. Emphysema and chronic interstitial changes. Electronically Signed   By: Luke Bun M.D.   On: 04/22/2024 19:06   CT Head Wo Contrast Result Date: 04/22/2024 SWITCH COMPARISON 7 / 25 / 20 EXAM: CT HEAD WITHOUT CONTRAST 04/22/2024 06:55:32 PM TECHNIQUE: CT of the head was performed without the administration of  intravenous contrast. Automated exposure control, iterative reconstruction, and/or weight based adjustment of the mA/kV was utilized to reduce the radiation dose to as low as reasonably achievable. COMPARISON: 7 / 25 / 20 CLINICAL HISTORY: Mental status change, unknown cause. Patient keeps having intermittent uncontrollable symptoms of stiffening her body. FINDINGS: BRAIN AND VENTRICLES: No acute hemorrhage. No evidence of acute infarct. No hydrocephalus. No extra-axial collection. No mass effect or midline shift. Patchy white matter hypodensities, compatible with chronic microvascular ischemic disease. ORBITS: No acute abnormality. SINUSES: No acute abnormality. SOFT TISSUES AND SKULL: No acute soft tissue abnormality. No skull fracture. IMPRESSION: 1. No acute intracranial abnormality. Electronically signed by: Norman Gatlin MD 04/22/2024 06:58 PM EDT RP Workstation: [id number]   PROCEDURES: Critical Care performed: Yes, see critical care procedure note(s) .1-3 Lead EKG Interpretation  Performed by: Jossie Artist POUR, MD Authorized by: Jossie Artist POUR, MD     Interpretation: abnormal     ECG rate:  131   ECG rate assessment: tachycardic     Rhythm: sinus tachycardia     Ectopy: none     Conduction: normal   CRITICAL CARE Performed by: Sharne Linders K Isrrael Fluckiger  Total critical care time: 47 minutes  Critical care time was exclusive of separately billable procedures and treating other patients.  Critical care was necessary to treat or prevent imminent or life-threatening deterioration.  Critical care was time spent personally by me on the following activities: development of treatment plan with patient and/or surrogate as well  as nursing, discussions with consultants, evaluation of patient's response to treatment, examination of patient, obtaining history from patient or surrogate, ordering and performing treatments and interventions, ordering and review of laboratory studies, ordering and review of  radiographic studies, pulse oximetry and re-evaluation of patient's condition.  MEDICATIONS ORDERED IN ED: Medications  nicardipine (CARDENE) 20mg  in 0.86% saline 200ml IV infusion (0.1 mg/ml) (12.5 mg/hr Intravenous Rate/Dose Change 04/22/24 2052)  PHENObarbital (LUMINAL) 260 mg in sodium chloride  0.9 % 100 mL IVPB (has no administration in time range)  ipratropium-albuterol  (DUONEB) 0.5-2.5 (3) MG/3ML nebulizer solution 6 mL (6 mLs Nebulization Given 04/22/24 1824)  ondansetron  (ZOFRAN ) injection 4 mg (4 mg Intravenous Given 04/22/24 1822)  LORazepam  (ATIVAN ) injection 1 mg (1 mg Intravenous Given 04/22/24 2017)  iohexol  (OMNIPAQUE ) 350 MG/ML injection 100 mL (100 mLs Intravenous Contrast Given 04/22/24 2124)  buprenorphine -naloxone  (SUBOXONE ) 8-2 mg per SL tablet 1 tablet (1 tablet Sublingual Given 04/22/24 2212)  LORazepam  (ATIVAN ) injection 2 mg (2 mg Intravenous Given 04/22/24 2211)   IMPRESSION / MDM / ASSESSMENT AND PLAN / ED COURSE  I reviewed the triage vital signs and the nursing notes.                             The patient is on the cardiac monitor to evaluate for evidence of arrhythmia and/or significant heart rate changes. Patient's presentation is most consistent with acute presentation with potential threat to life or bodily function. Patient is a 63 year old female with the above-stated past medical history that presents for multiple complaints including shortness of breath, muscular spasm, and shaking chills DDx: COPD exacerbation, hypercarbic respiratory failure, intracranial hemorrhage, hypertensive emergency Plan: CBC, CMP, troponin, BNP, VBG, lactic acid, UA, UDS, ethanol, chest x-ray, head CT, EKG  Given negative chest x-ray, will move forward with CT angiography of the chest concerning for possible aortic disease given persistent hypertensive and tachycardia.  Results show persistently elevated transaminitis and it is mildly elevated from baseline at this time.   Patient has elevated BNP at 315 concerning for hypertensive urgency.  Lactic acid 1.2.  Troponin negative x 1.  UDS negative.  Alcohol negative.  Chest x-ray shows emphysematous changes.  CT angiography of the chest and abdomen and pelvis show incidental findings without any evidence of acute abnormalities  Patient now on Cardene drip due to hypertensive emergency with elevated BNP.  Due to the possibility of alcohol withdrawal, patient given Ativan  with moderate improvement in her blood pressure. Consults: I spoke to Dr. Hilma and the pulmonologist service who agrees with admission  Dispo: Admit to medicine Clinical Course as of 04/22/24 2231  Wed Apr 22, 2024  2210 Temp Source: Oral [NR]    Clinical Course User Index [NR] Levander Slate, MD   FINAL CLINICAL IMPRESSION(S) / ED DIAGNOSES   Final diagnoses:  Hypertensive urgency  Tachycardia   Rx / DC Orders   ED Discharge Orders     None      Note:  This document was prepared using Dragon voice recognition software and may include unintentional dictation errors.   Jossie Artist POUR, MD 04/22/24 [phone]

## 2024-04-22 NOTE — ED Notes (Signed)
 Pt c/o feeling wet. This tech and Magen, Charity fundraiser in rm with pt. Pure wick was not in proper place. Pt bed linen changed, brief placed on pt and pure wick placed properly. Call light within reach. Pt has no further needs at this time.

## 2024-04-22 NOTE — ED Notes (Signed)
 Arm board placed on pt's arm so cardene gtt would go in. Pt keeps bending arm. Denies Chest pain

## 2024-04-22 NOTE — H&P (Incomplete)
 History and Physical    Nicole [name] FMW:[id number] DOB: 11-01-1960 DOA: 04/22/2024  Referring MD/NP/PA:   PCP: Herold Hadassah SQUIBB, MD   Patient coming from:  The patient is coming from home.     Chief Complaint: Confusion, agitation, elevated blood pressure, SOB,  HPI: Nicole [name] is a 63 y.o. 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  Patient has confusion and agitation, and is unable to provide accurate medical history, therefore, most of the history is obtained by discussing the case with ED physician, per EMS report, and with the nursing staff. I also called her husband, who provided some information.  Per her husband, patient's blood pressure has been elevated since this morning.  She is taking her blood pressure medications per her husband.  Per report, her blood pressure was up to 281/121 --> 241/121 in ED.  Per husband, patient did not have chest pain or abdominal pain.  Did not have nausea, vomiting or diarrhea at home.  No fever or chills.  When I saw patient in ED, patient is confused, knows her own name, agitated, not cooperative, not following command, not orientated to place and time.  Patient is tremulous.  Heart rate up to 140-160s in ED. She has dry cough and SOB.  She moves all extremities.  Not sure if patient has symptoms of UTI.  Not sure when the last time she drinks alcohol.  Data reviewed independently and ED Course: pt was found to have negative UDS, WBC 6.7, alcohol level less than 15, negative UA, GFR> 60, BNP 315, troponin 8, lactic acid 1.2.  Temperature normal, RR 31, oxygen sat 98% on room air.  VBG with pH 7.42, CO2 39 and O2 53.  Chest x-ray clear.  CT of  head negative.  CTA of chest/abdomen/pelvis negative for dissection.  Patient is admitted to PCU as inpatient.   CTA of chest/abd/pelvis:  1. Negative for acute aortic dissection or aneurysm. 2. Emphysema with mild subpleural reticulation/mild interstitial disease as was seen on the prior exam. 3. Multiple small pulmonary nodules, most of which are stable. There is a new or not definitively seen right apical pulmonary nodule measuring 8 x 5 mm. Non-contrast chest CT at 6-12 months is recommended. If the nodule is stable at time of repeat CT, then future CT at 18-24 months (from today's scan) is considered optional for low-risk patients, but is recommended for high-risk patients. This recommendation follows the consensus statement: Guidelines for Management of Incidental Pulmonary Nodules Detected on CT Images: From the Fleischner Society 2017; Radiology 2017; 284:228-243. 4. Possible gallbladder wall thickening, no calcified stones by CT. Ultrasound follow-up as indicated. Prominent common bile duct to the head of pancreas measuring up to 8 mm. Correlate with LFTs with follow-up MRCP as indicated. 5. Atrophic left kidney with scarring. Nonobstructing left kidney stone. 6. Thickened appearance of the left ventricle which may be correlated with echocardiography 7. Aortic atherosclerosis.   Aortic Atherosclerosis (ICD10-I70.0) and Emphysema (ICD10-J43.9).    EKG: I have personally reviewed.  Sinus rhythm, QTc 437, poor R wave aggression, possible left atrial enlargement.   Review of Systems: Could not be reviewed due to altered mental status.   Allergy: No Known Allergies  Past Medical History:  Diagnosis Date   Alcohol  withdrawal (HCC) 01/28/2022   Anxiety    Arthritis    Asthma    Bipolar disorder (HCC)    Collagen vascular disease    COPD (chronic obstructive pulmonary disease) (HCC)    Empyema (HCC) 09/05/2020   Hepatitis C    Hypertension    Korsakoff's psychosis,  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   Substance abuse (HCC)     Past Surgical History:  Procedure Laterality Date   ABDOMINAL HYSTERECTOMY     ESOPHAGOGASTRODUODENOSCOPY N/A 01/16/2024   Procedure: EGD (ESOPHAGOGASTRODUODENOSCOPY);  Surgeon: Jinny Carmine, MD;  Location: [facility] ENDOSCOPY;  Service: Endoscopy;  Laterality: N/A;   TONSILLECTOMY     TONSILLECTOMY      Social History:  reports that she has been smoking cigarettes. She has a 66 pack-year smoking history. She has never used smokeless tobacco. She reports current alcohol use of about 4.0 standard drinks of alcohol per week. She reports current drug use. Drugs: Cocaine and Marijuana.  Family History:  Family History  Problem Relation Age of Onset   Depression Mother    Hypertension Mother    Heart attack Mother    Insomnia Mother    Thyroid disease Mother    Bipolar disorder Mother    Other Father        Parkinson's disease   Parkinson's disease Father    Arthritis Maternal Grandmother    Bipolar disorder Maternal Grandmother    Depression Maternal Grandmother    Other Maternal Grandfather        unknown medical history   Heart attack Paternal Grandmother    Ovarian cancer Paternal Grandmother    Hyperlipidemia Paternal Grandfather    Hypertension Paternal Grandfather    Congestive Heart Failure Paternal Grandfather    Rheum arthritis Other    Osteoporosis Other    Clotting disorder Other    Stroke Other      Prior to Admission medications   Medication Sig Start Date End Date Taking? Authorizing Provider  albuterol  (PROVENTIL  HFA) 108 (90 Base) MCG/ACT inhaler Inhale 1-2 puffs into the lungs once every 6 (six) hours as needed for wheezing or shortness of breath. 04/02/24   Herold Hadassah SQUIBB, MD  Blood Pressure KIT USE AS DIRECTED TWICE DAILY. 10/03/21   Iloabachie, Chioma E, NP  budesonide -glycopyrrolate -formoterol  (BREZTRI  AEROSPHERE) 160-9-4.8 MCG/ACT AERO inhaler Inhale 2 puffs into the lungs 2 (two) times daily.  04/02/24   Herold Hadassah SQUIBB, MD  Buprenorphine  HCl-Naloxone  HCl 8-2 MG FILM Place 1 Film under the tongue 2 (two) times daily. 04/10/24 05/10/24  Herold Hadassah SQUIBB, MD  hydrochlorothiazide  (MICROZIDE ) 12.5 MG capsule Take 1 capsule (12.5 mg total) by mouth daily. 02/28/24   Herold Hadassah SQUIBB, MD  hydrOXYzine  (ATARAX ) 25 MG tablet Take 1 tablet (25 mg total) by mouth every 8 (eight) hours as needed for itching. 02/13/24   Herold Hadassah SQUIBB, MD  lidocaine -prilocaine  (EMLA ) cream Apply 1 Application topically as needed. 02/06/24   Herold Hadassah SQUIBB, MD  mirtazapine  (REMERON ) 30 MG tablet Take 30 mg by mouth at bedtime. 12/20/23   [provider]  naltrexone  (DEPADE) 50 MG tablet Take 1 tablet (50 mg total) by mouth daily. 02/13/24   Herold Hadassah SQUIBB, MD  naproxen  (NAPROSYN ) 250 MG tablet Take 250 mg by mouth 2 (two) times daily as needed for mild pain or moderate pain.    [provider]  Nystatin (GERHARDT'S BUTT CREAM) CREA Apply 1 Application topically 3 (three) times daily. 01/22/24   Zhang, Dekui, MD  omeprazole (PRILOSEC)  40 MG capsule Take 1 capsule (40 mg total) by mouth 2 (two) times daily. 04/10/24   Herold Hadassah SQUIBB, MD  QUEtiapine  (SEROQUEL ) 200 MG tablet TAKE 1 TABLET BY MOUTH EVERYDAY AT BEDTIME 04/10/24   Herold Hadassah SQUIBB, MD    Physical Exam: Vitals:   04/23/24 1030 04/23/24 1103 04/23/24 1126 04/23/24 1130  BP: (!) 161/100 (!) 145/95  (!) 148/90  Pulse: (!) 101 (!) 115  (!) 102  Resp: (!) 22 (!) 26  14  Temp:   98.2 F (36.8 C)   TempSrc:   Oral   SpO2: 94% 99%  98%  Weight:      Height:       General: Patient is tremulous.  Dry mucous membrane HEENT:       Eyes: PERRL, EOMI, no jaundice       ENT: No discharge from the ears and nose,       Neck: No JVD, no bruit, no mass felt. Heme: No neck lymph node enlargement. Cardiac: S1/S2, RRR, No murmurs, No gallops or rubs. Respiratory: No rales, wheezing, rhonchi or rubs. GI: Soft, nondistended, no organomegaly, BS  present. GU: No hematuria Ext: No pitting leg edema bilaterally. 1+DP/PT pulse bilaterally. Musculoskeletal: No joint deformities, No joint redness or warmth, no limitation of ROM in spin. Skin: No rashes.  Neuro: Confused with agitation, knows her own name, not following command, not cooperative, no orientated to time and place. Cranial nerves II-XII grossly intact, moves all extremities Psych: Patient is not psychotic, no suicidal or hemocidal ideation.  Labs on Admission: I have personally reviewed following labs and imaging studies  CBC: Recent Labs  Lab 04/22/24 1817 04/23/24 0357  WBC 6.7 8.0  NEUTROABS 3.7  --   HGB 13.6 13.8  HCT 39.9 40.0  MCV 95.9 93.9  PLT 241 254   Basic Metabolic Panel: Recent Labs  Lab 04/22/24 1817 04/22/24 2219 04/23/24 0357  NA 136  --  130*  K 3.8  --  3.6  CL 104  --  105  CO2 21*  --  22  GLUCOSE 112*  --  128*  BUN 14  --  10  CREATININE 0.96  --  0.81  CALCIUM 9.7  --  9.0  MG  --  1.3*  --   PHOS  --  2.5  --    GFR: Estimated Creatinine Clearance: 57.7 mL/min (by C-G formula based on SCr of 0.81 mg/dL). Liver Function Tests: Recent Labs  Lab 04/22/24 1817 04/23/24 0357  AST 82* 86*  ALT 50* 51*  ALKPHOS 155* 137*  BILITOT 0.8 1.1  PROT 8.2* 8.7*  ALBUMIN 3.7 3.7   No results for input(s): LIPASE, AMYLASE in the last 168 hours. Recent Labs  Lab 04/23/24 0106  AMMONIA 24   Coagulation Profile: No results for input(s): INR, PROTIME in the last 168 hours. Cardiac Enzymes: No results for input(s): CKTOTAL, CKMB, CKMBINDEX, TROPONINI in the last 168 hours. BNP (last 3 results) No results for input(s): PROBNP in the last 8760 hours. HbA1C: No results for input(s): HGBA1C in the last 72 hours. CBG: Recent Labs  Lab 04/22/24 1812  GLUCAP 120*   Lipid Profile: No results for input(s): CHOL, HDL, LDLCALC, TRIG, CHOLHDL, LDLDIRECT in the last 72 hours. Thyroid Function  Tests: Recent Labs    04/23/24 0357  TSH 0.454  FREET4 0.56*   Anemia Panel: No results for input(s): VITAMINB12, FOLATE, FERRITIN, TIBC, IRON, RETICCTPCT in the last 72 hours. Urine analysis:  Component Value Date/Time   COLORURINE STRAW (A) 04/22/2024 1858   APPEARANCEUR CLEAR (A) 04/22/2024 1858   APPEARANCEUR Clear 04/02/2024 1536   LABSPEC 1.008 04/22/2024 1858   PHURINE 7.0 04/22/2024 1858   GLUCOSEU NEGATIVE 04/22/2024 1858   HGBUR NEGATIVE 04/22/2024 1858   BILIRUBINUR NEGATIVE 04/22/2024 1858   BILIRUBINUR Negative 04/02/2024 1536   KETONESUR NEGATIVE 04/22/2024 1858   PROTEINUR NEGATIVE 04/22/2024 1858   NITRITE NEGATIVE 04/22/2024 1858   LEUKOCYTESUR NEGATIVE 04/22/2024 1858   Sepsis Labs: @LABRCNTIP (procalcitonin:4,lacticidven:4) )No results found for this or any previous visit (from the past 240 hours).   Radiological Exams on Admission:   Assessment/Plan Principal Problem:   Hypertensive urgency Active Problems:   Sinus tachycardia   Alcohol withdrawal (HCC)   Chronic diastolic CHF (congestive heart failure) (HCC)   Acute metabolic encephalopathy   COPD (chronic obstructive pulmonary disease) (HCC)   History of bipolar disorder   Depression with anxiety   Opioid use disorder in remission   Cocaine abuse (HCC)   Nicotine  dependence   Lung nodule   Hyponatremia   Assessment and Plan:   Hypertensive urgency: her blood pressure was up to 281/121 --> 241/121 in ED  -will admit to PCU as inpt - Give 10 mg of IV labetalol  - Continue home HCTZ 12.5 mg daily - Started Cardene drip in the ED - IV hydralazine  as needed  Sinus tachycardia: Likely due to alcohol withdrawal.  Patient had abnormal TSH 0.219 on 04/02/2024. Patient received 10 mg of IV labetalol , heart rate improved to 110. -check TSH, Free T4 and Free T3  Alcohol withdrawal with delirium - CIWA protocol - Started phenobarbital taper per pharmacist dosing  Chronic  diastolic CHF (congestive heart failure) (HCC): Today: 09/07/2020 showed EF of 55 to 60% with grade 1 diastolic dysfunction.  BNP is elevated at 315, but clinically patient is dry. -Hold off diuretics.  Acute metabolic encephalopathy: Likely due to alcohol withdrawal delirium.  CT head negative. -Frequent neurocheck - Fall precaution  COPD (chronic obstructive pulmonary disease) (HCC): - Bronchodilators and as needed Mucinex   History of bipolar disorder, Depression with anxiety -Seroquel , Remeron , as needed hydroxyzine   Opioid use disorder in remission -Suboxone  twice daily  Cocaine abuse, Nicotine  dependence: UDS negative today - Nicotine  patch  Lung nodule: This is incidental findings. - Need to follow-up with her PCP as outpatient for repeating CT scan in 6 months      DVT ppx: SQ Lovenox   Code Status: Full code    Family Communication:     Yes, patient's husband by phone    Disposition Plan:  Anticipate discharge back to previous environment  Consults called:  none  Admission status and Level of care: Progressive:  as inpt        Dispo: The patient is from: Home              Anticipated d/c is to: Home              Anticipated d/c date is: 2 days              Patient currently is not medically stable to d/c.    Severity of Illness:  The appropriate patient status for this patient is INPATIENT. Inpatient status is judged to be reasonable and necessary in order to provide the required intensity of service to ensure the patient's safety. The patient's presenting symptoms, physical exam findings, and initial radiographic and laboratory data in the context of their chronic comorbidities is felt to  place them at high risk for further clinical deterioration. Furthermore, it is not anticipated that the patient will be medically stable for discharge from the hospital within 2 midnights of admission.   * I certify that at the point of admission it is my clinical judgment  that the patient will require inpatient hospital care spanning beyond 2 midnights from the point of admission due to high intensity of service, high risk for further deterioration and high frequency of surveillance required.*       Date of Service 04/23/2024    Caleb Exon [facility]   If 7PM-7AM, please contact night-coverage [email or web address] 04/23/2024, 12:01 PM

## 2024-04-23 ENCOUNTER — Inpatient Hospital Stay

## 2024-04-23 DIAGNOSIS — R911 Solitary pulmonary nodule: Secondary | ICD-10-CM

## 2024-04-23 DIAGNOSIS — G9341 Metabolic encephalopathy: Secondary | ICD-10-CM

## 2024-04-23 DIAGNOSIS — R000 Tachycardia, unspecified: Secondary | ICD-10-CM | POA: Diagnosis not present

## 2024-04-23 DIAGNOSIS — I160 Hypertensive urgency: Secondary | ICD-10-CM | POA: Diagnosis not present

## 2024-04-23 DIAGNOSIS — E871 Hypo-osmolality and hyponatremia: Secondary | ICD-10-CM

## 2024-04-23 DIAGNOSIS — F10931 Alcohol use, unspecified with withdrawal delirium: Secondary | ICD-10-CM

## 2024-04-23 DIAGNOSIS — I5032 Chronic diastolic (congestive) heart failure: Secondary | ICD-10-CM | POA: Diagnosis not present

## 2024-04-23 DIAGNOSIS — J439 Emphysema, unspecified: Secondary | ICD-10-CM

## 2024-04-23 LAB — CBC
HCT: 40 % (ref 36.0–46.0)
Hemoglobin: 13.8 g/dL (ref 12.0–15.0)
MCH: 32.4 pg (ref 26.0–34.0)
MCHC: 34.5 g/dL (ref 30.0–36.0)
MCV: 93.9 fL (ref 80.0–100.0)
Platelets: 254 K/uL (ref 150–400)
RBC: 4.26 MIL/uL (ref 3.87–5.11)
RDW: 13.4 % (ref 11.5–15.5)
WBC: 8 K/uL (ref 4.0–10.5)
nRBC: 0 % (ref 0.0–0.2)

## 2024-04-23 LAB — COMPREHENSIVE METABOLIC PANEL WITH GFR
ALT: 51 U/L — ABNORMAL HIGH (ref 0–44)
AST: 86 U/L — ABNORMAL HIGH (ref 15–41)
Albumin: 3.7 g/dL (ref 3.5–5.0)
Alkaline Phosphatase: 137 U/L — ABNORMAL HIGH (ref 38–126)
Anion gap: 3 — ABNORMAL LOW (ref 5–15)
BUN: 10 mg/dL (ref 8–23)
CO2: 22 mmol/L (ref 22–32)
Calcium: 9 mg/dL (ref 8.9–10.3)
Chloride: 105 mmol/L (ref 98–111)
Creatinine, Ser: 0.81 mg/dL (ref 0.44–1.00)
GFR, Estimated: >60 mL/min (ref >60)
Glucose, Bld: 128 mg/dL — ABNORMAL HIGH (ref 70–99)
Potassium: 3.6 mmol/L (ref 3.5–5.1)
Sodium: 130 mmol/L — ABNORMAL LOW (ref 135–145)
Total Bilirubin: 1.1 mg/dL (ref 0.0–1.2)
Total Protein: 8.7 g/dL — ABNORMAL HIGH (ref 6.5–8.1)

## 2024-04-23 LAB — T4, FREE: Free T4: 0.56 ng/dL — ABNORMAL LOW (ref 0.61–1.12)

## 2024-04-23 LAB — TSH: TSH: 0.454 u[IU]/mL (ref 0.350–4.500)

## 2024-04-23 LAB — AMMONIA: Ammonia: 24 umol/L (ref 9–35)

## 2024-04-23 MED ORDER — IRBESARTAN 150 MG PO TABS
150.0000 mg | ORAL_TABLET | Freq: Every day | ORAL | Status: DC
  Administered 2024-04-23: 150 mg via ORAL
  Filled 2024-04-23 (×2): qty 1

## 2024-04-23 MED ORDER — BUDESON-GLYCOPYRROL-FORMOTEROL 160-9-4.8 MCG/ACT IN AERO
2.0000 | INHALATION_SPRAY | Freq: Two times a day (BID) | RESPIRATORY_TRACT | Status: DC
Start: 2024-04-23 — End: 2024-04-24
  Administered 2024-04-23 – 2024-04-24 (×2): 2 via RESPIRATORY_TRACT
  Filled 2024-04-23 (×2): qty 5.9

## 2024-04-23 MED ORDER — AMLODIPINE BESYLATE 5 MG PO TABS
10.0000 mg | ORAL_TABLET | Freq: Every day | ORAL | Status: DC
  Administered 2024-04-23 – 2024-04-24 (×2): 10 mg via ORAL
  Filled 2024-04-23 (×2): qty 2

## 2024-04-23 MED ORDER — QUETIAPINE FUMARATE 200 MG PO TABS
200.0000 mg | ORAL_TABLET | Freq: Every day | ORAL | Status: DC
Start: 2024-04-23 — End: 2024-04-24
  Administered 2024-04-23: 200 mg via ORAL
  Filled 2024-04-23: qty 1

## 2024-04-23 MED ORDER — BUPRENORPHINE HCL-NALOXONE HCL 8-2 MG SL SUBL
1.0000 | SUBLINGUAL_TABLET | Freq: Every day | SUBLINGUAL | Status: DC
  Administered 2024-04-23: 1 via SUBLINGUAL
  Filled 2024-04-23: qty 1

## 2024-04-23 MED ORDER — MIRTAZAPINE 15 MG PO TABS
30.0000 mg | ORAL_TABLET | Freq: Every day | ORAL | Status: DC
  Administered 2024-04-23: 30 mg via ORAL
  Filled 2024-04-23: qty 2

## 2024-04-23 MED ORDER — PANTOPRAZOLE SODIUM 40 MG PO TBEC
40.0000 mg | DELAYED_RELEASE_TABLET | Freq: Every day | ORAL | Status: DC
  Administered 2024-04-23 – 2024-04-24 (×2): 40 mg via ORAL
  Filled 2024-04-23 (×2): qty 1

## 2024-04-23 MED ORDER — MAGNESIUM SULFATE 4 GM/100ML IV SOLN
4.0000 g | Freq: Once | INTRAVENOUS | Status: AC
  Administered 2024-04-23: 4 g via INTRAVENOUS
  Filled 2024-04-23: qty 100

## 2024-04-23 MED ORDER — HYDROCHLOROTHIAZIDE 25 MG PO TABS
12.5000 mg | ORAL_TABLET | Freq: Every day | ORAL | Status: DC
  Administered 2024-04-23: 12.5 mg via ORAL
  Filled 2024-04-23: qty 1

## 2024-04-23 MED ORDER — HYDROXYZINE HCL 25 MG PO TABS
25.0000 mg | ORAL_TABLET | Freq: Three times a day (TID) | ORAL | Status: DC | PRN
  Administered 2024-04-24: 25 mg via ORAL
  Filled 2024-04-23: qty 1

## 2024-04-23 NOTE — ED Notes (Signed)
 Pt eating dinner at this time

## 2024-04-23 NOTE — Hospital Course (Signed)
 63 y.o. 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   Patient has confusion and agitation, and is unable to provide accurate medical history, therefore, most of the history is obtained by discussing the case with ED physician, per EMS report, and with the nursing staff. I also called her husband, who provided some information.   Per her husband, patient's blood pressure has been elevated since this morning.  She is taking her blood pressure medications per her husband.  Per report, her blood pressure was up to 281/121 --> 241/121 in ED.  Per husband, patient did not have chest pain or abdominal pain.  Did not have nausea, vomiting or diarrhea at home.  No fever or chills.   When I saw patient in ED, patient is confused, knows her own name, agitated, not cooperative, not following command, not orientated to place and time.  Patient is tremulous.  Heart rate up to 140-160s in ED. She has dry cough and SOB.  She moves all extremities.  Not sure if patient has symptoms of UTI.  Not sure when the last time she drinks alcohol.   Data reviewed independently and ED Course: pt was found to have negative UDS, WBC 6.7, alcohol level less than 15, negative UA, GFR> 60, BNP 315, troponin 8, lactic acid 1.2.  Temperature normal, RR 31, oxygen sat 98% on room air.  VBG with pH 7.42, CO2 39 and O2 53.  Chest x-ray clear.  CT of head negative.  CTA of chest/abdomen/pelvis negative for dissection.  Patient is admitted to PCU as inpatient.  10/23.  Patient's lethargic (has been went back to sleep tried to talk a little bit.  She was able to move all of her extremities on her own but not to command.  Has been states that patient has not  been drinking (as far as he knows).

## 2024-04-23 NOTE — Assessment & Plan Note (Signed)
 Potentially due to alcohol versus hypertensive urgency.  Will get an MRI of the brain and EEG.

## 2024-04-23 NOTE — Assessment & Plan Note (Addendum)
 No signs of heart failure currently.  Last EF 55%

## 2024-04-23 NOTE — ED Notes (Signed)
 Pt to MRI

## 2024-04-23 NOTE — Assessment & Plan Note (Addendum)
 Placed on Cardene drip.  CT scan of the chest does not show aortic dissection

## 2024-04-23 NOTE — Assessment & Plan Note (Signed)
 Ruled out.  Patient and husband deny alcohol withdrawal.  I will continue thiamine  and folic acid  but get rid of alcohol withdrawal protocol.  No signs of tremor.

## 2024-04-23 NOTE — ED Notes (Signed)
 Fall bundle in place. Non slip socks, fall risk band, and bed alarm

## 2024-04-23 NOTE — Assessment & Plan Note (Addendum)
 Patient on Cardene drip.  Norvasc  if able to take.  Will hold hydrochlorothiazide .  Blood pressure trending better.

## 2024-04-23 NOTE — Progress Notes (Signed)
 Eeg done

## 2024-04-23 NOTE — ED Notes (Signed)
 Called CCMD for central monitoring at this time

## 2024-04-23 NOTE — ED Notes (Signed)
 Assumed care of pt from [facility]. Went to introduce self to pt. Pt has eyes closed. Chest rise and fall noted. Respirations wnl. VS stable at this time.

## 2024-04-23 NOTE — ED Notes (Signed)
 Messaged pharmacy regarding patient's missing dose of irbesartan 

## 2024-04-23 NOTE — Assessment & Plan Note (Signed)
Respiratory status stable. 

## 2024-04-23 NOTE — Progress Notes (Addendum)
 Progress Note   Patient: Nicole [name] FMW:[id number] DOB: Dec 03, 1960 DOA: 04/22/2024     1 DOS: the patient was seen and examined on 04/23/2024   Brief hospital course: 63 y.o. 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   Patient has confusion and agitation, and is unable to provide accurate medical history, therefore, most of the history is obtained by discussing the case with ED physician, per EMS report, and with the nursing staff. I also called her husband, who provided some information.   Per her husband, patient's blood pressure has been elevated since this morning.  She is taking her blood pressure medications per her husband.  Per report, her blood pressure was up to 281/121 --> 241/121 in ED.  Per husband, patient did not have chest pain or abdominal pain.  Did not have nausea, vomiting or diarrhea at home.  No fever or chills.   When I saw patient in ED, patient is confused, knows her own name, agitated, not cooperative, not following command, not orientated to place and time.  Patient is tremulous.  Heart rate up to 140-160s in ED. She has dry cough and SOB.  She moves all extremities.  Not sure if patient has symptoms of UTI.  Not sure when the last time she drinks alcohol.   Data reviewed independently and ED Course: pt was found to have negative UDS, WBC 6.7, alcohol level less than 15, negative UA, GFR> 60, BNP 315, troponin 8, lactic acid 1.2.  Temperature normal, RR 31, oxygen sat 98% on room air.  VBG with pH 7.42, CO2 39 and O2 53.  Chest x-ray clear.  CT of head negative.  CTA of chest/abdomen/pelvis negative for dissection.  Patient is admitted to PCU as inpatient.  10/23.   Patient's lethargic (has been went back to sleep tried to talk a little bit.  She was able to move all of her extremities on her own but not to command.  Has been states that patient has not been drinking (as far as he knows).  Assessment and Plan: * Hypertensive urgency Patient on Cardene drip.  Norvasc  if able to take.  Will hold hydrochlorothiazide .  Blood pressure trending better.  Sinus tachycardia Placed on Cardene drip.  CT scan of the chest does not show aortic dissection  Acute metabolic encephalopathy Potentially due to alcohol versus hypertensive urgency.  Will get an MRI of the brain and EEG.  Chronic diastolic CHF (congestive heart failure) (HCC) No signs of heart failure currently.  Last EF 55%  Alcohol withdrawal (HCC) Possibility of alcohol withdrawal.  Placed on phenobarbital protocol.  COPD (chronic obstructive pulmonary disease) (HCC) Respiratory status stable  History of bipolar disorder On Seroquel  Remeron  and as needed hydroxyzine   Opioid use disorder in remission On Suboxone .  Urine toxicology negative.  Nicotine  dependence Nicotine  patch  Lung nodule Multiple lung nodules.  Follow-up as outpatient with repeat CT scan.  Hyponatremia Hold hydrochlorothiazide  with sodium of 130        Subjective: Patient was agitated earlier but when I saw her she was resting.  She opened her eyes for second tried to talk and went back to sleep.  She did move all her extremities on her own adjusting herself in bed.  Admitted with acute metabolic  encephalopathy and hypertensive urgency.  Physical Exam: Vitals:   04/23/24 1030 04/23/24 1103 04/23/24 1126 04/23/24 1130  BP: (!) 161/100 (!) 145/95  (!) 148/90  Pulse: (!) 101 (!) 115  (!) 102  Resp: (!) 22 (!) 26  14  Temp:   98.2 F (36.8 C)   TempSrc:   Oral   SpO2: 94% 99%  98%  Weight:      Height:       Physical Exam HENT:     Head: Normocephalic.  Eyes:     General: Lids are normal.      Conjunctiva/sclera: Conjunctivae normal.  Cardiovascular:     Rate and Rhythm: Normal rate and regular rhythm.     Heart sounds: Normal heart sounds, S1 normal and S2 normal.  Pulmonary:     Breath sounds: No decreased breath sounds, wheezing, rhonchi or rales.  Abdominal:     Palpations: Abdomen is soft.     Tenderness: There is no abdominal tenderness.  Musculoskeletal:     Right lower leg: No swelling.     Left lower leg: No swelling.  Skin:    General: Skin is warm.     Findings: No rash.  Neurological:     Mental Status: She is lethargic.     Comments: Moved her extremities on her own.     Data Reviewed: Urine toxicology negative, sodium 130, potassium 3.6, creatinine 0.81, liver function test AST 86, ALT 51, total bilirubin 1.1, alkaline phosphatase 137, white blood cell count 8.0, hemoglobin 13.8, platelet count 254, urine analysis negative, CT scan of the head negative, CT scan of the chest negative for aortic dissection shows emphysema with multiple small pulmonary nodules recommending CT scan in 6 to 12 months Family Communication: Spoke with husband on the phone  Disposition: Status is: Inpatient Remains inpatient appropriate because: Patient lethargic today.  Need to get an MRI of the brain and EEG.  Planned Discharge Destination: Home    Time spent: 28 minutes  Author: Charlie Patterson, MD 04/23/2024 12:06 PM  For on call review [email or web address].

## 2024-04-23 NOTE — ED Notes (Signed)
 Secure chatted Dr. Cleatus:  Hey Dr. Cleatus. This pt is being admitted for hypertensive crisis, AMS, possible alcohol withdrawal.  BP on arrival to ED was 241/121. She was placed on Cardene gtt. HR was in between 130's-150's. Dr. Hilma told me to give 10mg  IV Labetalol . I did. HR and BP improved and Dr. Hilma said for me to stop the Cardene gtt. BP is slowly creeping back up. BP right now is 195/101 and keeps jumping into the 200's systolic. Do you want me to give something else or start the Cardene gtt again? Don't wanna drop her pressure too much since it was originally so high.

## 2024-04-23 NOTE — Assessment & Plan Note (Signed)
 Hold hydrochlorothiazide  with sodium of 130

## 2024-04-23 NOTE — Assessment & Plan Note (Addendum)
 On Suboxone .  Urine toxicology negative.

## 2024-04-23 NOTE — Assessment & Plan Note (Signed)
 On Seroquel  Remeron  and as needed hydroxyzine 

## 2024-04-23 NOTE — Assessment & Plan Note (Signed)
 Nicotine  patch

## 2024-04-23 NOTE — Assessment & Plan Note (Signed)
 Multiple lung nodules.  Follow-up as outpatient with repeat CT scan.

## 2024-04-24 DIAGNOSIS — R000 Tachycardia, unspecified: Secondary | ICD-10-CM | POA: Diagnosis not present

## 2024-04-24 DIAGNOSIS — G9341 Metabolic encephalopathy: Secondary | ICD-10-CM | POA: Diagnosis not present

## 2024-04-24 DIAGNOSIS — R569 Unspecified convulsions: Secondary | ICD-10-CM

## 2024-04-24 DIAGNOSIS — I160 Hypertensive urgency: Secondary | ICD-10-CM | POA: Diagnosis not present

## 2024-04-24 DIAGNOSIS — I5032 Chronic diastolic (congestive) heart failure: Secondary | ICD-10-CM | POA: Diagnosis not present

## 2024-04-24 DIAGNOSIS — R4182 Altered mental status, unspecified: Secondary | ICD-10-CM

## 2024-04-24 DIAGNOSIS — M549 Dorsalgia, unspecified: Secondary | ICD-10-CM | POA: Insufficient documentation

## 2024-04-24 DIAGNOSIS — E876 Hypokalemia: Secondary | ICD-10-CM

## 2024-04-24 DIAGNOSIS — N179 Acute kidney failure, unspecified: Secondary | ICD-10-CM

## 2024-04-24 LAB — BASIC METABOLIC PANEL WITH GFR
Anion gap: 13 (ref 5–15)
BUN: 25 mg/dL — ABNORMAL HIGH (ref 8–23)
CO2: 23 mmol/L (ref 22–32)
Calcium: 9.1 mg/dL (ref 8.9–10.3)
Chloride: 101 mmol/L (ref 98–111)
Creatinine, Ser: 1.46 mg/dL — ABNORMAL HIGH (ref 0.44–1.00)
GFR, Estimated: 41 mL/min — ABNORMAL LOW (ref >60)
Glucose, Bld: 118 mg/dL — ABNORMAL HIGH (ref 70–99)
Potassium: 3.3 mmol/L — ABNORMAL LOW (ref 3.5–5.1)
Sodium: 134 mmol/L — ABNORMAL LOW (ref 135–145)

## 2024-04-24 LAB — CBC
HCT: 38.4 % (ref 36.0–46.0)
Hemoglobin: 13.2 g/dL (ref 12.0–15.0)
MCH: 32.4 pg (ref 26.0–34.0)
MCHC: 34.4 g/dL (ref 30.0–36.0)
MCV: 94.1 fL (ref 80.0–100.0)
Platelets: 240 K/uL (ref 150–400)
RBC: 4.08 MIL/uL (ref 3.87–5.11)
RDW: 13.4 % (ref 11.5–15.5)
WBC: 7.5 K/uL (ref 4.0–10.5)
nRBC: 0 % (ref 0.0–0.2)

## 2024-04-24 LAB — T3, FREE: T3, Free: 3.4 pg/mL (ref 2.0–4.4)

## 2024-04-24 MED ORDER — POTASSIUM CHLORIDE CRYS ER 20 MEQ PO TBCR
20.0000 meq | EXTENDED_RELEASE_TABLET | Freq: Once | ORAL | Status: AC
  Administered 2024-04-24: 20 meq via ORAL
  Filled 2024-04-24: qty 1

## 2024-04-24 MED ORDER — VITAMIN B-1 100 MG PO TABS: 100.0000 mg | ORAL_TABLET | Freq: Every day | ORAL | 0 refills | Status: DC

## 2024-04-24 MED ORDER — FOLIC ACID 1 MG PO TABS: 1.0000 mg | ORAL_TABLET | Freq: Every day | ORAL | 0 refills | Status: AC

## 2024-04-24 MED ORDER — SODIUM CHLORIDE 0.9 % IV BOLUS
500.0000 mL | Freq: Once | INTRAVENOUS | Status: AC
  Administered 2024-04-24: 500 mL via INTRAVENOUS

## 2024-04-24 MED ORDER — METHOCARBAMOL 500 MG PO TABS: 500.0000 mg | ORAL_TABLET | Freq: Three times a day (TID) | ORAL | 0 refills | Status: DC | PRN

## 2024-04-24 MED ORDER — AMLODIPINE BESYLATE 10 MG PO TABS: 10.0000 mg | ORAL_TABLET | Freq: Every day | ORAL | 0 refills | Status: DC

## 2024-04-24 MED ORDER — BUPRENORPHINE HCL-NALOXONE HCL 8-2 MG SL SUBL
1.0000 | SUBLINGUAL_TABLET | Freq: Two times a day (BID) | SUBLINGUAL | Status: DC
  Administered 2024-04-24: 1 via SUBLINGUAL
  Filled 2024-04-24: qty 1

## 2024-04-24 MED ORDER — METHOCARBAMOL 500 MG PO TABS
500.0000 mg | ORAL_TABLET | Freq: Three times a day (TID) | ORAL | Status: DC
  Administered 2024-04-24: 500 mg via ORAL
  Filled 2024-04-24: qty 1

## 2024-04-24 NOTE — Discharge Instructions (Signed)
 Residential Substance Use Treatment Services   [facility] ([facility]  [address]  [place], [place] [address]  [phone] or [phone] Detox (Medicare, Medicaid, private insurance, and self pay)  Residential Rehab 14 days (Medicare, [place], private insurance, and self pay)   [facility])  [address] [place], [place]  [phone]  Female and Female Detox (Self Pay and Medicaid limited availability)  Rehab only Female ([place] and self pay only)   [facility] [address]      [address]  [place], [place] [address]  [phone] or [phone] Detox and Residential Treatment Private Insurance Only   [facility]  [address] [place]  [place], [place] [address]  [phone]  Treatment Only, must make assessment appointment, and must be sober for assessment appointment.  Self Pay Only, Medicare A&B, [facility], [place] ID only! *Transportation assistance offered from [place] [employer]     [address] [place], [place] [address] Walk in interviews M-Sat 8-4p No pending legal charges [phone]     ADATC:  [facility] Referral  [address] [place], [place] [phone] (Self Pay, [facility])  [facility] [address] [place], [place] [address] [phone] Detox and Residential Treatment Medicare and Private Insurance  [facility] [address]  [place], [place] [address] 28 Day Women's Facility: [phone] 28 Day Men's Facility: [phone] Long-term Residential Program:  [phone] Males 25 and Over (No Insurance, upfront fee)  [facility]  [address] [place], [place] [address] [phone] Private Insurance with [employer], Private Pay  [facility] [address] [place], [place] [address] Local [phone] Private Insurance Only  [facility] [address]  [place], [place] [address]  [phone] (Males, upfront fee)  [facility] [address]  [place], [address] [phone] Private Insurance

## 2024-04-24 NOTE — Discharge Summary (Signed)
 Physician Discharge Summary   Patient: Nicole [name] MRN: [id number] DOB: [birth year]  Admit date:     04/22/2024  Discharge date: 04/24/24  Discharge Physician: Charlie Patterson   PCP: Herold Hadassah SQUIBB, MD   Recommendations at discharge:   Follow-up PCP 5 days  Discharge Diagnoses: Principal Problem:   Hypertensive urgency Active Problems:   Sinus tachycardia   Alcohol withdrawal (HCC)   Chronic diastolic CHF (congestive heart failure) (HCC)   Acute metabolic encephalopathy   COPD (chronic obstructive pulmonary disease) (HCC)   History of bipolar disorder   Depression with anxiety   Opioid use disorder in remission   Cocaine abuse (HCC)   Nicotine  dependence   Lung nodule   Hyponatremia   Hypokalemia   AKI (acute kidney injury)   Back pain   Hospital Course: 63 y.o. 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   Patient has confusion and agitation, and is unable to provide accurate medical history, therefore, most of the history is obtained by discussing the case with ED physician, per EMS report, and with the nursing staff. I also called her husband, who provided some information.   Per her husband, patient's blood pressure has been elevated since this morning.  She is taking her blood pressure medications per her husband.  Per report, her blood pressure was up to 281/121 --> 241/121 in ED.  Per husband, patient did not have chest pain or abdominal pain.  Did not have nausea, vomiting or diarrhea at home.  No fever or chills.   In ED, patient is confused, knows her own name, agitated, not cooperative, not following command, not orientated to place and time.  Patient is tremulous.   Heart rate up to 140-160s in ED. She has dry cough and SOB.  She moves all extremities.  Not sure if patient has symptoms of UTI.  Not sure when the last time she drinks alcohol.   Data reviewed independently and ED Course: pt was found to have negative UDS, WBC 6.7, alcohol level less than 15, negative UA, GFR> 60, BNP 315, troponin 8, lactic acid 1.2.  Temperature normal, RR 31, oxygen sat 98% on room air.  VBG with pH 7.42, CO2 39 and O2 53.  Chest x-ray clear.  CT of head negative.  CTA of chest/abdomen/pelvis negative for dissection.  Patient is admitted to PCU as inpatient.  10/23.  Patient's lethargic (has been went back to sleep tried to talk a little bit.  She was able to move all of her extremities on her own but not to command.  Has been states that patient has not been drinking (as far as he knows). 10/24.  Patient feeling a lot better.  Wants to go home.  Patient states that she has been eating a lot of canned tomato soup.  I told her that this is a lot of salt and would elevate her blood pressure.  Assessment and Plan: * Hypertensive urgency Patient initially on Cardene drip.  Now patient on Norvasc .  Last blood pressure 127/80.  Patient advised that she cannot eat 4 canned tomato soups per day because of the amount of sodium.  Sinus tachycardia Placed initial on Cardene drip.  CT scan of the chest does not show aortic dissection.  Last heart  rate 81  Acute metabolic encephalopathy EEG negative for seizure.  MRI of the brain showed no acute intracranial abnormality.  Cystic focus along the left aspect of the body of the sphenoid adjacent to the carotid canal medial to the left neck okay if nonemergent MRI of the skull base/trigeminal nerve may be helpful for further evaluation.  Since patient wanted to go home and felt well I will leave this up to the primary care physician.  Mental status much improved.  Chronic diastolic CHF (congestive heart failure) (HCC) No signs of heart failure  currently.  Last EF 55%  Alcohol withdrawal (HCC) Ruled out.  Patient and husband deny alcohol withdrawal.  I will continue thiamine  and folic acid  but get rid of alcohol withdrawal protocol.  No signs of tremor.  COPD (chronic obstructive pulmonary disease) (HCC) Respiratory status stable  History of bipolar disorder On Seroquel  Remeron  and as needed hydroxyzine   Opioid use disorder in remission On Suboxone .  Urine toxicology negative.  Nicotine  dependence Nicotine  patch  Lung nodule Multiple lung nodules.  Follow-up as outpatient with repeat CT scan.  Back pain Patient given a dose of Robaxin which helped.  Patient does have scoliosis.  AKI (acute kidney injury) Creatinine went up to 1.46 on 1024.  She was given a fluid bolus.  Patient wanted to go home.  Will hold olmesartan  and hydrochlorothiazide .  Continue Norvasc  for blood pressure.  Recommend checking a BMP and follow-up appointment.  Hypokalemia Oral potassium replacement  Hyponatremia Hold hydrochlorothiazide  with hyponatremia.  Sodium upon discharge 134         Consultants: None Procedures performed: None Disposition: Home Diet recommendation:  Cardiac diet DISCHARGE MEDICATION: Allergies as of 04/24/2024   No Known Allergies      Medication List     STOP taking these medications    Gerhardt's butt cream Crea   hydrochlorothiazide  12.5 MG capsule Commonly known as: MICROZIDE    naltrexone  50 MG tablet Commonly known as: DEPADE   naproxen  250 MG tablet Commonly known as: NAPROSYN        TAKE these medications    albuterol  108 (90 Base) MCG/ACT inhaler Commonly known as: Proventil  HFA Inhale 1-2 puffs into the lungs once every 6 (six) hours as needed for wheezing or shortness of breath.   amLODipine  10 MG tablet Commonly known as: NORVASC  Take 1 tablet (10 mg total) by mouth daily. Start taking on: April 25, 2024   Blood Pressure Kit USE AS DIRECTED TWICE DAILY.   Breztri   Aerosphere 160-9-4.8 MCG/ACT Aero inhaler Generic drug: budesonide -glycopyrrolate -formoterol  Inhale 2 puffs into the lungs 2 (two) times daily.   Buprenorphine  HCl-Naloxone  HCl 8-2 MG Film Place 1 Film under the tongue 2 (two) times daily.   folic acid  1 MG tablet Commonly known as: FOLVITE  Take 1 tablet (1 mg total) by mouth daily. Start taking on: April 25, 2024   hydrOXYzine  25 MG tablet Commonly known as: ATARAX  Take 1 tablet (25 mg total) by mouth every 8 (eight) hours as needed for itching.   lidocaine -prilocaine  cream Commonly known as: EMLA  Apply 1 Application topically as needed.   methocarbamol 500 MG tablet Commonly known as: ROBAXIN Take 1 tablet (500 mg total) by mouth every 8 (eight) hours as needed for muscle spasms.   mirtazapine  30 MG tablet Commonly known as: REMERON  Take 30 mg by mouth at bedtime.   omeprazole 40 MG capsule Commonly known as: PRILOSEC Take 1 capsule (40 mg total) by mouth 2 (two) times daily.   QUEtiapine   200 MG tablet Commonly known as: SEROQUEL  TAKE 1 TABLET BY MOUTH EVERYDAY AT BEDTIME   thiamine  100 MG tablet Commonly known as: Vitamin B-1 Take 1 tablet (100 mg total) by mouth daily. Start taking on: April 25, 2024        Follow-up Information     Herold Hadassah SQUIBB, MD Follow up in 5 day(s).   Specialty: Family Medicine Contact information: [address] [place] [address] [phone]                Discharge Exam: Fredricka Weights   04/22/24 1814  Weight: 54 kg   Physical Exam HENT:     Head: Normocephalic.  Eyes:     General: Lids are normal.     Conjunctiva/sclera: Conjunctivae normal.  Cardiovascular:     Rate and Rhythm: Normal rate and regular rhythm.     Heart sounds: Normal heart sounds, S1 normal and S2 normal.  Pulmonary:     Breath sounds: No decreased breath sounds, wheezing, rhonchi or rales.  Abdominal:     Palpations: Abdomen is soft.     Tenderness: There is no abdominal tenderness.   Musculoskeletal:     Right lower leg: No swelling.     Left lower leg: No swelling.  Skin:    General: Skin is warm.     Findings: No rash.  Neurological:     Mental Status: She is alert and oriented to person, place, and time.      Condition at discharge: stable  The results of significant diagnostics from this hospitalization (including imaging, microbiology, ancillary and laboratory) are listed below for reference.   Imaging Studies: EEG adult Result Date: 04/24/2024 Matthews Elida HERO, MD     04/24/2024 11:36 AM Routine EEG Report Lynnix Schoneman Alvelo is a 64 y.o. female with a history of altered mental status who is undergoing an EEG to evaluate for seizures. Report: This EEG was acquired with electrodes placed according to the International 10-20 electrode system (including Fp1, Fp2, F3, F4, C3, C4, P3, P4, O1, O2, T3, T4, T5, T6, A1, A2, Fz, Cz, Pz). The following electrodes were missing or displaced: none. The occipital dominant rhythm was 8.5 Hz. This activity is reactive to stimulation. Drowsiness was manifested by background fragmentation; deeper stages of sleep were identified by K complexes and sleep spindles. There was no focal slowing. There were no interictal epileptiform discharges. There were no electrographic seizures identified. There was no abnormal response to photic stimulation or hyperventilation. Impression: This EEG was obtained while awake and asleep and is normal.   Clinical Correlation: Normal EEGs, however, do not rule out epilepsy. Elida Matthews, MD [facility] [phone] If 7pm- 7am, please page neurology on call as listed in AMION.   MR BRAIN WO CONTRAST Result Date: 04/23/2024 EXAM: MRI BRAIN WITHOUT CONTRAST 04/23/2024 02:07:36 PM TECHNIQUE: Multiplanar multisequence MRI of the head/brain was performed without the administration of intravenous contrast. COMPARISON: CT head 04/22/2024. CLINICAL HISTORY: Mental status change, unknown cause. Patient  has confusion and agitation, and is unable to provide accurate medical history. FINDINGS: BRAIN AND VENTRICLES: T2 FLAIR hyperintensity in the periventricular and subcortical white matter compatible with mild chronic microvascular ischemic changes. Mild parenchymal volume loss. 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 No acute infarct. No intracranial hemorrhage. No mass. No midline shift. No hydrocephalus. The sella is unremarkable. Normal flow voids. ORBITS: No acute abnormality. 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 There is a cystic focus along  the left aspect of the body of the sphenoid adjacent to the carotid canal. This focus appears medial to the left Meckel cave. No additional associated signal abnormality or restricted diffusion in this region. Consider nonemergent MRI of the skull base with dedicated images of the trigeminal nerves for further evaluation. No acute abnormality in the mastoids. BONES AND SOFT TISSUES: Degenerative changes in the visualized upper cervical spine with 3 mm anterolisthesis of C3 on C4. Normal marrow signal. No acute soft tissue abnormality. IMPRESSION: 1. No acute intracranial abnormality. 2. Mild chronic microvascular ischemic changes and mild parenchymal volume loss. 3. Cystic focus along the left aspect of the body of the sphenoid adjacent to the carotid canal, medial to the left Meckel cave. Nonemergent MRI of the skull base/trigeminal nerve may be helpful for further evaluation. Electronically signed by: Donnice Mania MD 04/23/2024 02:34 PM EDT RP Workstation: [id number]   CT Angio Chest/Abd/Pel for Dissection W and/or W/WO Result Date: 04/22/2024 CLINICAL DATA:  Intermittent uncontrollable symptoms of stiffening shortness of breath EXAM: CT ANGIOGRAPHY CHEST, ABDOMEN AND PELVIS TECHNIQUE: Non-contrast CT of the chest was initially obtained. Multidetector CT imaging through the chest, abdomen and pelvis was performed using  the standard protocol during bolus administration of intravenous contrast. Multiplanar reconstructed images and MIPs were obtained and reviewed to evaluate the vascular anatomy. RADIATION DOSE REDUCTION: This exam was performed according to the departmental dose-optimization program which includes automated exposure control, adjustment of the mA and/or kV according to patient size and/or use of iterative reconstruction technique. CONTRAST:  OMNIPAQUE  IOHEXOL  350 MG/ML SOLN COMPARISON:  Chest x-ray 04/22/2024, chest CT 02/22/2022 FINDINGS: CTA CHEST FINDINGS Cardiovascular: Non contrasted images of the chest demonstrate no acute intramural hematoma. Negative for aneurysm or dissection. Common origin of the left common carotid and right brachiocephalic arteries. Mild coronary vascular calcification. Normal cardiac size. No pericardial effusion thickened appearance of the left ventricle. Mediastinum/Nodes: Patent trachea. No thyroid mass. No suspicious lymph nodes. Esophagus within normal limits. Lungs/Pleura: Emphysema. No acute confluent airspace disease. Mild subpleural reticulation as was seen on the prior exam. Scattered small pulmonary nodules many of which are stable, for example punctate 2 mm left upper lobe subpleural nodule on series 6, image 50, previously 2 mm. Subpleural left lower lobe 4 mm pulmonary nodule on series 6, image 89, previously 5 mm. Subpleural right lower lobe pulmonary nodule measuring 5 mm on series 6, image 89, previously 5 mm. New or not definitively seen right apical slightly irregular pulmonary nodule measuring 8 by 5 mm on series 6, image 31. Musculoskeletal: Sternum appears intact. No acute osseous abnormality. Review of the MIP images confirms the above findings. CTA ABDOMEN AND PELVIS FINDINGS VASCULAR Aorta: Normal caliber aorta without aneurysm, dissection, vasculitis or significant stenosis. Moderate aortic atherosclerosis Celiac: Patent without evidence of aneurysm,  dissection, vasculitis or significant stenosis. SMA: Mild narrowing at the origin but widely patent distally. Renals: Both renal arteries are patent without evidence of aneurysm, dissection, vasculitis, fibromuscular dysplasia or significant stenosis. IMA: Patent without evidence of aneurysm, dissection, vasculitis or significant stenosis. Inflow: Patent without evidence of aneurysm, dissection, vasculitis or significant stenosis. Veins: Suboptimally assessed Review of the MIP images confirms the above findings. NON-VASCULAR Hepatobiliary: No focal hepatic abnormality. No calcified gallstones. Possible gallbladder wall thickening, series 5 image 155. Prominent common bile duct to the head of pancreas measuring up to 8 mm. No intra hepatic biliary dilatation Pancreas: Slightly prominent pancreatic duct.  No inflammation Spleen: Borderline enlarged at 12 cm craniocaudal. Heterogenous  hypodense areas presumably due to phase of arterial enhancement. Adrenals/Urinary Tract: Adrenal glands are normal. Atrophic left kidney with scarring. No hydronephrosis. Small stone lower pole left kidney. The bladder is unremarkable Stomach/Bowel: Fall the stomach is within normal limits. There is no dilated small bowel. No acute bowel wall thickening. Negative appendix. Lymphatic: No suspicious lymph nodes. Reproductive: Hysterectomy.  No adnexal mass Other: . negative for pelvic effusion or free air Musculoskeletal: Levoscoliosis and degenerative changes. No acute osseous abnormality Review of the MIP images confirms the above findings. IMPRESSION: 1. Negative for acute aortic dissection or aneurysm. 2. Emphysema with mild subpleural reticulation/mild interstitial disease as was seen on the prior exam. 3. Multiple small pulmonary nodules, most of which are stable. There is a new or not definitively seen right apical pulmonary nodule measuring 8 x 5 mm. Non-contrast chest CT at 6-12 months is recommended. If the nodule is stable at  time of repeat CT, then future CT at 18-24 months (from today's scan) is considered optional for low-risk patients, but is recommended for high-risk patients. This recommendation follows the consensus statement: Guidelines for Management of Incidental Pulmonary Nodules Detected on CT Images: From the Fleischner Society 2017; Radiology 2017; 284:228-243. 4. Possible gallbladder wall thickening, no calcified stones by CT. Ultrasound follow-up as indicated. Prominent common bile duct to the head of pancreas measuring up to 8 mm. Correlate with LFTs with follow-up MRCP as indicated. 5. Atrophic left kidney with scarring. Nonobstructing left kidney stone. 6. Thickened appearance of the left ventricle which may be correlated with echocardiography 7. Aortic atherosclerosis. Aortic Atherosclerosis (ICD10-I70.0) and Emphysema (ICD10-J43.9). Electronically Signed   By: Luke Bun M.D.   On: 04/22/2024 21:59   DG Chest Port 1 View Result Date: 04/22/2024 CLINICAL DATA:  Shortness of breath and cough EXAM: PORTABLE CHEST 1 VIEW COMPARISON:  01/18/2024, CT chest 02/22/2022 FINDINGS: Emphysema and chronic appearing interstitial opacities. No acute airspace disease or effusion. Stable cardiomediastinal silhouette with aortic atherosclerosis IMPRESSION: No active disease. Emphysema and chronic interstitial changes. Electronically Signed   By: Luke Bun M.D.   On: 04/22/2024 19:06   CT Head Wo Contrast Result Date: 04/22/2024 SWITCH COMPARISON 7 / 25 / 20 EXAM: CT HEAD WITHOUT CONTRAST 04/22/2024 06:55:32 PM TECHNIQUE: CT of the head was performed without the administration of intravenous contrast. Automated exposure control, iterative reconstruction, and/or weight based adjustment of the mA/kV was utilized to reduce the radiation dose to as low as reasonably achievable. COMPARISON: 7 / 25 / 20 CLINICAL HISTORY: Mental status change, unknown cause. Patient keeps having intermittent uncontrollable symptoms of  stiffening her body. FINDINGS: BRAIN AND VENTRICLES: No acute hemorrhage. No evidence of acute infarct. No hydrocephalus. No extra-axial collection. No mass effect or midline shift. Patchy white matter hypodensities, compatible with chronic microvascular ischemic disease. ORBITS: No acute abnormality. SINUSES: No acute abnormality. SOFT TISSUES AND SKULL: No acute soft tissue abnormality. No skull fracture. IMPRESSION: 1. No acute intracranial abnormality. Electronically signed by: Norman Gatlin MD 04/22/2024 06:58 PM EDT RP Workstation: [id number]     Labs: CBC: Recent Labs  Lab 04/22/24 1817 04/23/24 0357 04/24/24 0441  WBC 6.7 8.0 7.5  NEUTROABS 3.7  --   --   HGB 13.6 13.8 13.2  HCT 39.9 40.0 38.4  MCV 95.9 93.9 94.1  PLT 241 254 240   Basic Metabolic Panel: Recent Labs  Lab 04/22/24 1817 04/22/24 2219 04/23/24 0357 04/24/24 0441  NA 136  --  130* 134*  K 3.8  --  3.6  3.3*  CL 104  --  105 101  CO2 21*  --  22 23  GLUCOSE 112*  --  128* 118*  BUN 14  --  10 25*  CREATININE 0.96  --  0.81 1.46*  CALCIUM 9.7  --  9.0 9.1  MG  --  1.3*  --   --   PHOS  --  2.5  --   --    Liver Function Tests: Recent Labs  Lab 04/22/24 1817 04/23/24 0357  AST 82* 86*  ALT 50* 51*  ALKPHOS 155* 137*  BILITOT 0.8 1.1  PROT 8.2* 8.7*  ALBUMIN 3.7 3.7   CBG: Recent Labs  Lab 04/22/24 1812  GLUCAP 120*    Discharge time spent: greater than 30 minutes.  Signed: Charlie Patterson, MD [facility] 04/24/2024

## 2024-04-24 NOTE — Evaluation (Signed)
 Occupational Therapy Evaluation Patient Details Name: Nicole [name] MRN: [id number] DOB: June 18, 1961 Today's Date: 04/24/2024   History of Present Illness   Nicole [name] is a 63 y.o. female with a history of altered mental status, substance abuse disorder, anxiety, asthma, bipolar disorder, HepC, COPD, HTN and who is undergoing an EEG to assess for seizures.     Clinical Impressions Patient presenting with decreased Ind in self care,balance, and functional mobility/transfers, endurance, and safety awareness. Patient reports being Ind at baseline and living at home with son. Pt is likely poor historian and no family present to confirm baseline. Pt performing bed mobility and self care tasks from EOB/bed level with supervision. Pt is very fatigued and returned to bed and requesting warm blankets. Having difficulty keeping eyes opened but is being cooperative. Nurse states pt recently got all of her medications and has been OOB with staff today. Patient will benefit from acute OT to increase overall independence in the areas of ADLs, functional mobility, and safety awareness in order to safely discharge.      Functional Status Assessment   Patient has not had a recent decline in their functional status     Equipment Recommendations   None recommended by OT      Precautions/Restrictions   Precautions Precautions: Fall     Mobility Bed Mobility Overal bed mobility: Independent                  Transfers Overall transfer level: Needs assistance Equipment used: 1 person hand held assist Transfers: Sit to/from Stand Sit to Stand: Contact guard assist                  Balance Overall balance assessment: Mild deficits observed, not formally tested                                         ADL either performed or assessed with clinical judgement   ADL Overall ADL's : Needs assistance/impaired     Grooming: Oral care;Wash/dry  hands;Wash/dry face;Set up                   Toilet Transfer: Contact guard assist Toilet Transfer Details (indicate cue type and reason): simulated         Functional mobility during ADLs: Contact guard assist       Vision Patient Visual Report: No change from baseline              Pertinent Vitals/Pain Pain Assessment Pain Assessment: Faces Faces Pain Scale: Hurts little more Pain Location: back Pain Descriptors / Indicators: Discomfort Pain Intervention(s): Limited activity within patient's tolerance, Premedicated before session, Repositioned     Extremity/Trunk Assessment Upper Extremity Assessment Upper Extremity Assessment: Generalized weakness   Lower Extremity Assessment Lower Extremity Assessment: Generalized weakness       Communication Communication Communication: No apparent difficulties   Cognition Arousal: Alert Behavior During Therapy: Restless, Flat affect Cognition: No family/caregiver present to determine baseline             OT - Cognition Comments: slow processing                 Following commands: Impaired Following commands impaired: Follows one step commands with increased time, Only follows one step commands consistently     Cueing  General Comments   Cueing Techniques: Verbal cues  Home Living Family/patient expects to be discharged to:: Private residence Living Arrangements: Spouse/significant other Available Help at Discharge: Family Type of Home: House Home Access: Stairs to enter [employer] of Steps: 4-5 Entrance Stairs-Rails: Left Home Layout: Multi-level;Able to live on main level with bedroom/bathroom     Bathroom Shower/Tub: Tub/shower unit                    Prior Functioning/Environment Prior Level of Function : History of Falls (last six months);Patient poor historian/Family not available;Independent/Modified Independent             Mobility  Comments: Pt reports being Ind with ambulation ADLs Comments: Ind with ADLs and IADLs but does not drive    OT Problem List: Decreased strength;Decreased safety awareness;Decreased activity tolerance;Decreased knowledge of use of DME or AE;Impaired balance (sitting and/or standing)   OT Treatment/Interventions: Self-care/ADL training;Therapeutic activities;Therapeutic exercise;Energy conservation;Patient/family education;DME and/or AE instruction;Balance training      OT Goals(Current goals can be found in the care plan section)   Acute Rehab OT Goals Patient Stated Goal: to go home and feel better OT Goal Formulation: With patient Time For Goal Achievement: 05/08/24 Potential to Achieve Goals: Fair ADL Goals Pt Will Perform Grooming: with modified independence Pt Will Perform Lower Body Dressing: with modified independence;sit to/from stand Pt Will Transfer to Toilet: with modified independence;ambulating Pt Will Perform Toileting - Clothing Manipulation and hygiene: with modified independence;sit to/from stand   OT Frequency:  Min 1X/week       AM-PAC OT 6 Clicks Daily Activity     Outcome Measure Help from another person eating meals?: None Help from another person taking care of personal grooming?: None Help from another person toileting, which includes using toliet, bedpan, or urinal?: A Little Help from another person bathing (including washing, rinsing, drying)?: A Little Help from another person to put on and taking off regular upper body clothing?: None Help from another person to put on and taking off regular lower body clothing?: A Little 6 Click Score: 21   End of Session Nurse Communication: Mobility status  Activity Tolerance: Patient tolerated treatment well Patient left: in bed  OT Visit Diagnosis: Unsteadiness on feet (R26.81);Repeated falls (R29.6);Muscle weakness (generalized) (M62.81)                Time: 8991-8978 OT Time Calculation (min): 13  min Charges:  OT General Charges $OT Visit: 1 Visit OT Evaluation $OT Eval Low Complexity: 1 Low  Izetta Claude, MS, OTR/L , CBIS ascom [phone]  04/24/24, 1:27 PM

## 2024-04-24 NOTE — Procedures (Signed)
 Routine EEG Report  Nicole [name] is a 63 y.o. female with a history of altered mental status who is undergoing an EEG to evaluate for seizures.  Report: This EEG was acquired with electrodes placed according to the International 10-20 electrode system (including Fp1, Fp2, F3, F4, C3, C4, P3, P4, O1, O2, T3, T4, T5, T6, A1, A2, Fz, Cz, Pz). The following electrodes were missing or displaced: none.  The occipital dominant rhythm was 8.5 Hz. This activity is reactive to stimulation. Drowsiness was manifested by background fragmentation; deeper stages of sleep were identified by K complexes and sleep spindles. There was no focal slowing. There were no interictal epileptiform discharges. There were no electrographic seizures identified. There was no abnormal response to photic stimulation or hyperventilation.   Impression: This EEG was obtained while awake and asleep and is normal.    Clinical Correlation: Normal EEGs, however, do not rule out epilepsy.  [name], MD [facility] [phone]  If 7pm- 7am, please page neurology on call as listed in AMION.

## 2024-04-24 NOTE — Assessment & Plan Note (Signed)
Oral potassium replacement

## 2024-04-24 NOTE — TOC Initial Note (Signed)
 Transition of Care [facility]) - Initial/Assessment Note    Patient Details  Name: Nicole [name] MRN: [id number] Date of Birth: 1960-12-02  [facility]) CM/SW Contact:    [place] L Tyreon Frigon, LCSW Phone Number: 04/24/2024, 8:12 AM  Clinical Narrative:                  [facility] consult received requesting substance abuse counseling/education. TOC does not provide counseling and education.   Resources have been uploaded to the AVS.        Patient Goals and CMS Choice            Expected Discharge Plan and Services                                              Prior Living Arrangements/Services                       Activities of Daily Living      Permission Sought/Granted                  Emotional Assessment              Admission diagnosis:  Hypertensive urgency [I16.0] Patient Active Problem List   Diagnosis Date Noted   Hypertensive urgency 04/22/2024   Chronic diastolic CHF (congestive heart failure) (HCC) 04/22/2024   Depression with anxiety 04/22/2024   Acute metabolic encephalopathy 04/22/2024   Lung nodule 04/22/2024   Opioid use disorder in remission 02/18/2024   Generalized pruritus 02/18/2024   Decubitus ulcer of left hip, stage 2 (HCC) 01/19/2024   GERD without esophagitis 01/15/2024   Depression 01/15/2024   COPD (chronic obstructive pulmonary disease) (HCC) 01/15/2024   Irritant dermatitis 01/15/2024   Alcohol withdrawal (HCC) 01/28/2022   Hearing loss 11/08/2021   Sinus tachycardia 11/08/2021   Hip pain, chronic, right 10/10/2021   History of bipolar disorder 10/03/2021   History of COPD 10/03/2021   Ear stuffiness, left 10/03/2021   Ankle edema, bilateral 10/03/2021   Protein-calorie malnutrition, severe 09/02/2020   Collagen vascular disease    Hyponatremia    Nicotine  dependence    Hypertension 12/10/2018   Alcohol use disorder 10/22/2016   Cocaine abuse (HCC) 10/22/2016   Asthma 10/06/2015    PCP:  Herold Hadassah SQUIBB, MD Pharmacy:   [phone] [place] [place] - [address] [place] [address] Phone: [phone] Fax: [phone]     Social Drivers of Health (SDOH) Social History: SDOH Screenings   Food Insecurity: No Food Insecurity (01/15/2024)  Housing: Low Risk  (01/15/2024)  Transportation Needs: No Transportation Needs (01/15/2024)  Utilities: Not At Risk (01/15/2024)  Alcohol Screen: Low Risk  (10/03/2021)  Depression (PHQ2-9): High Risk (04/02/2024)  Tobacco Use: High Risk (04/22/2024)   SDOH Interventions:     Readmission Risk Interventions     No data to display

## 2024-04-24 NOTE — Assessment & Plan Note (Signed)
 Creatinine went up to 1.46 on 1024.  She was given a fluid bolus.  Patient wanted to go home.  Will hold olmesartan  and hydrochlorothiazide .  Continue Norvasc  for blood pressure.  Recommend checking a BMP and follow-up appointment.

## 2024-04-24 NOTE — ED Notes (Signed)
 A coke and graham crackers were given to the pt

## 2024-04-24 NOTE — Assessment & Plan Note (Signed)
 Patient given a dose of Robaxin which helped.  Patient does have scoliosis.

## 2024-04-27 ENCOUNTER — Telehealth: Payer: Self-pay

## 2024-04-27 NOTE — Transitions of Care (Post Inpatient/ED Visit) (Unsigned)
   04/27/2024  Name: Tanis Hensarling MRN: [id number] DOB: Feb 28, 1961  Today's TOC FU Call Status: Today's TOC FU Call Status:: Unsuccessful Call (1st Attempt) Unsuccessful Call (1st Attempt) Date: 04/27/24  Attempted to reach the patient regarding the most recent Inpatient/ED visit.  Follow Up Plan: Additional outreach attempts will be made to reach the patient to complete the Transitions of Care (Post Inpatient/ED visit) call.   Signature Julian Lemmings, LPN [facility] Nurse Health Advisor Direct Dial [phone]

## 2024-04-28 DIAGNOSIS — E66813 Obesity, class 3: Secondary | ICD-10-CM | POA: Diagnosis not present

## 2024-04-28 DIAGNOSIS — N179 Acute kidney failure, unspecified: Secondary | ICD-10-CM | POA: Diagnosis not present

## 2024-04-28 DIAGNOSIS — L89222 Pressure ulcer of left hip, stage 2: Secondary | ICD-10-CM | POA: Diagnosis not present

## 2024-04-28 DIAGNOSIS — R531 Weakness: Secondary | ICD-10-CM | POA: Diagnosis not present

## 2024-04-28 NOTE — Transitions of Care (Post Inpatient/ED Visit) (Signed)
 04/28/2024  Name: Nicole [name] MRN: [id number] DOB: 06-Feb-1961  Today's TOC FU Call Status: Today's TOC FU Call Status:: Successful TOC FU Call Completed Unsuccessful Call (1st Attempt) Date: 04/27/24 [facility] FU Call Complete Date: 04/28/24 Patient's Name and Date of Birth confirmed.  Transition Care Management Follow-up Telephone Call Date of Discharge: 04/24/24 Discharge Facility: [facility]) Type of Discharge: Inpatient Admission Primary Inpatient Discharge Diagnosis:: hypertensive urgency How have you been since you were released from the hospital?: Better Any questions or concerns?: No  Items Reviewed: Did you receive and understand the discharge instructions provided?: Yes Medications obtained,verified, and reconciled?: Yes (Medications Reviewed) Any new allergies since your discharge?: No Dietary orders reviewed?: Yes Do you have support at home?: Yes People in Home [RPT]: spouse  Medications Reviewed Today: Medications Reviewed Today     Reviewed by Emmitt Pan, LPN (Licensed Practical Nurse) on 04/28/24 at 1141  Med List Status: <None>   Medication Order Taking? Sig Documenting Provider Last Dose Status Informant  albuterol  (PROVENTIL  HFA) 108 (90 Base) MCG/ACT inhaler [id number] Yes Inhale 1-2 puffs into the lungs once every 6 (six) hours as needed for wheezing or shortness of breath. Herold Hadassah SQUIBB, MD  Active Spouse/Significant Other  amLODipine  (NORVASC ) 10 MG tablet [id number] Yes Take 1 tablet (10 mg total) by mouth daily. Josette Ade, MD  Active   Blood Pressure KIT [id number] Yes USE AS DIRECTED TWICE DAILY. Iloabachie, Chioma E, NP  Active Spouse/Significant Other, Pharmacy Records  budesonide -glycopyrrolate -formoterol  (BREZTRI  AEROSPHERE) 160-9-4.8 MCG/ACT AERO inhaler [id number] Yes Inhale 2 puffs into the lungs 2 (two) times daily. Herold Hadassah SQUIBB, MD  Active Spouse/Significant Other  Buprenorphine  HCl-Naloxone  HCl 8-2  MG FILM [id number] Yes Place 1 Film under the tongue 2 (two) times daily. Herold Hadassah SQUIBB, MD  Active Spouse/Significant Other  folic acid  (FOLVITE ) 1 MG tablet [id number] Yes Take 1 tablet (1 mg total) by mouth daily. Josette Ade, MD  Active   hydrOXYzine  (ATARAX ) 25 MG tablet [id number] Yes Take 1 tablet (25 mg total) by mouth every 8 (eight) hours as needed for itching. Herold Hadassah SQUIBB, MD  Active Spouse/Significant Other  lidocaine -prilocaine  (EMLA ) cream [id number] Yes Apply 1 Application topically as needed. Herold Hadassah SQUIBB, MD  Active Spouse/Significant Other  methocarbamol (ROBAXIN) 500 MG tablet [id number] Yes Take 1 tablet (500 mg total) by mouth every 8 (eight) hours as needed for muscle spasms. Josette Ade, MD  Active   mirtazapine  (REMERON ) 30 MG tablet [id number] Yes Take 30 mg by mouth at bedtime. [provider]  Active Spouse/Significant Other, Pharmacy Records  omeprazole (PRILOSEC) 40 MG capsule [id number] Yes Take 1 capsule (40 mg total) by mouth 2 (two) times daily. Herold Hadassah SQUIBB, MD  Active Spouse/Significant Other  QUEtiapine  (SEROQUEL ) 200 MG tablet [id number] Yes TAKE 1 TABLET BY MOUTH EVERYDAY AT BEDTIME Herold Hadassah SQUIBB, MD  Active Spouse/Significant Other  thiamine  (VITAMIN B-1) 100 MG tablet [id number] Yes Take 1 tablet (100 mg total) by mouth daily. Josette Ade, MD  Active             Home Care and Equipment/Supplies: Were Home Health Services Ordered?: NA Any new equipment or medical supplies ordered?: NA  Functional Questionnaire: Do you need assistance with bathing/showering or dressing?: No Do you need assistance with meal preparation?: No Do you need assistance with eating?: No Do you have difficulty maintaining continence: No Do you need assistance with getting out of bed/getting out of a chair/moving?: No Do you have  difficulty managing or taking your medications?: No  Follow up appointments reviewed: PCP Follow-up appointment  confirmed?: Yes Date of PCP follow-up appointment?: 05/06/24 Follow-up Provider: [facility] Follow-up appointment confirmed?: NA Do you need transportation to your follow-up appointment?: No Do you understand care options if your condition(s) worsen?: Yes-patient verbalized understanding    SIGNATURE Julian Lemmings, LPN [facility] Nurse Health Advisor Direct Dial [phone]

## 2024-04-29 DIAGNOSIS — M5459 Other low back pain: Secondary | ICD-10-CM | POA: Diagnosis not present

## 2024-04-29 DIAGNOSIS — M51362 Other intervertebral disc degeneration, lumbar region with discogenic back pain and lower extremity pain: Secondary | ICD-10-CM | POA: Diagnosis not present

## 2024-04-29 DIAGNOSIS — M41126 Adolescent idiopathic scoliosis, lumbar region: Secondary | ICD-10-CM | POA: Diagnosis not present

## 2024-04-29 DIAGNOSIS — M4807 Spinal stenosis, lumbosacral region: Secondary | ICD-10-CM | POA: Diagnosis not present

## 2024-04-29 DIAGNOSIS — M48062 Spinal stenosis, lumbar region with neurogenic claudication: Secondary | ICD-10-CM | POA: Diagnosis not present

## 2024-04-30 ENCOUNTER — Encounter: Payer: Self-pay | Admitting: Pediatrics

## 2024-04-30 ENCOUNTER — Ambulatory Visit: Admitting: Pediatrics

## 2024-04-30 VITALS — BP 157/102 | HR 89 | Temp 97.9°F | Resp 15 | Ht 62.01 in | Wt 128.9 lb

## 2024-04-30 DIAGNOSIS — Z1159 Encounter for screening for other viral diseases: Secondary | ICD-10-CM | POA: Diagnosis not present

## 2024-04-30 DIAGNOSIS — I10 Essential (primary) hypertension: Secondary | ICD-10-CM

## 2024-04-30 DIAGNOSIS — Z8659 Personal history of other mental and behavioral disorders: Secondary | ICD-10-CM | POA: Diagnosis not present

## 2024-04-30 DIAGNOSIS — F418 Other specified anxiety disorders: Secondary | ICD-10-CM

## 2024-04-30 DIAGNOSIS — Z9189 Other specified personal risk factors, not elsewhere classified: Secondary | ICD-10-CM

## 2024-04-30 NOTE — Progress Notes (Signed)
 Office Visit  BP (!) 157/102 (BP Location: Left Arm, Patient Position: Sitting, Cuff Size: Normal)   Pulse 89   Temp 97.9 F (36.6 C) (Oral)   Resp 15   Ht 5' 2.01 (1.575 m)   Wt 128 lb 14.4 oz (58.5 kg)   SpO2 98%   BMI 23.57 kg/m    Subjective:    Patient ID: Nicole [name], female    DOB: 1961-01-27, 63 y.o.   MRN: [id number]  HPI: Nicole [name] is a 63 y.o. female  Chief Complaint  Patient presents with   Hospitalization Follow-up    Hypertensive urgency. BP meds have been changed. Added folic acid . Mad muscle spasms and started prednisone  and gabapentin .     Discussed the use of AI scribe software for clinical note transcription with the patient, who gave verbal consent to proceed.  History of Present Illness   Nicole [name] is a 63 year old female with hypertension and chronic back pain who presents for follow-up after a recent hospital stay.  She has experienced recent weight gain, now weighing 128 pounds, which is unusual for her. Her hip pain has resolved, but she continues to have rib pain from a previous injury where her ribs were cracked.  Her blood pressure has been significantly elevated, with systolic readings reaching as high as 296 mmHg, accompanied by spasms. She was previously on multiple antihypertensive medications but is now only taking amlodipine . She has hydrochlorothiazide  at home but is not currently using it. She experiences significant ankle swelling, particularly on the right side, which was not present during her hospital stay. She attributes some of her swelling to high blood pressure and possibly the Seroquel  she restarted. She takes three Seroquel  at night but still struggles with insomnia, which affects her energy levels.  She has a history of chronic back pain and was recently prescribed prednisone  and gabapentin  after discontinuing muscle relaxers. She reports being told she has a herniated disc and scoliosis based on  recent x-rays, and she is awaiting an MRI. She has been on gabapentin  23 years ago for back pain and is apprehensive about surgery due to past family experiences.  She has a history of bipolar disorder and manic episodes, previously treated with clonazepam  and lithium, but she did not tolerate lithium well. She is interested in seeing a psychiatrist for medication management and mentions a past psychiatrist, Dr. Beverley, in [place].  Her social history includes a past of intravenous cocaine use, with a history of rehabilitation 15 years ago. She was diagnosed with rheumatoid arthritis and hepatitis C during her rehab stay. She recently saw a back specialist who confirmed arthritis and a curved spine.     Relevant past medical, surgical, family and social history reviewed and updated as indicated. Interim medical history since our last visit reviewed. Allergies and medications reviewed and updated.  ROS per HPI unless specifically indicated above     Objective:    BP (!) 157/102 (BP Location: Left Arm, Patient Position: Sitting, Cuff Size: Normal)   Pulse 89   Temp 97.9 F (36.6 C) (Oral)   Resp 15   Ht 5' 2.01 (1.575 m)   Wt 128 lb 14.4 oz (58.5 kg)   SpO2 98%   BMI 23.57 kg/m   Wt Readings from Last 3 Encounters:  04/30/24 128 lb 14.4 oz (58.5 kg)  04/22/24 119 lb 0.8 oz (54 kg)  04/02/24 119 lb (54 kg)     Physical Exam Constitutional:  Appearance: Normal appearance.  Cardiovascular:     Rate and Rhythm: Normal rate and regular rhythm.     Pulses: Normal pulses.     Heart sounds: Normal heart sounds.  Pulmonary:     Effort: Pulmonary effort is normal.  Musculoskeletal:        General: Normal range of motion.     Right lower leg: No edema.     Left lower leg: No edema.  Skin:    Comments: Normal skin color  Neurological:     General: No focal deficit present.     Mental Status: She is alert. Mental status is at baseline.  Psychiatric:        Mood and Affect: Mood  normal.        Behavior: Behavior normal.        Thought Content: Thought content normal.         04/02/2024    2:41 PM 02/28/2024    2:32 PM 02/06/2024    2:54 PM 01/30/2024    2:49 PM 04/17/2022    5:16 PM  Depression screen PHQ 2/9  Decreased Interest 2 2 2 1 2   Down, Depressed, Hopeless 3 2 2 1 2   PHQ - 2 Score 5 4 4 2 4   Altered sleeping 3 3 0 1 3  Tired, decreased energy 3 2 3 1 3   Change in appetite 3 2 2 1 3   Feeling bad or failure about yourself  3 2 2 1 1   Trouble concentrating 2 2 2 2 3   Moving slowly or fidgety/restless 2 2 2 2 1   Suicidal thoughts 1 0 1 2 0  PHQ-9 Score 22  17  16  12  18    Difficult doing work/chores Somewhat difficult  Somewhat difficult Somewhat difficult      Data saved with a previous flowsheet row definition       04/02/2024    2:42 PM 02/28/2024    2:33 PM 02/06/2024    2:54 PM 01/30/2024    2:49 PM  GAD 7 : Generalized Anxiety Score  Nervous, Anxious, on Edge 3 2 2 1   Control/stop worrying 3 3 2 1   Worry too much - different things 3 3 2 1   Trouble relaxing 3 2 2 1   Restless 2 2 2 1   Easily annoyed or irritable 2 2 2 1   Afraid - awful might happen 3 2 2 1   Total GAD 7 Score 19 16 14 7   Anxiety Difficulty Very difficult Somewhat difficult Very difficult Somewhat difficult       Assessment & Plan:  Assessment & Plan   Primary hypertension Assessment & Plan: Recent admission for HTN urgency here for follow up. Hypertension poorly controlled. Hydrochlorothiazide  discontinued due to side effects. Olmesartan  preferred for better side effect profile. Exam wnl today.  - Restart olmesartan  20 mg daily. - Continue amlodipine . - Check electrolytes today. - Follow up in 2 weeks to assess blood pressure control.  Orders: -     Magnesium  -     Comprehensive metabolic panel with GFR  Encounter for hepatitis C screening test for high risk patient -     Hep B Core Ab W/Reflex -     HBcIgM -     Hepatitis C antibody; Future -     Hepatitis  C antibody -     Specimen status report  History of bipolar disorder Depression with anxiety Assessment & Plan: Bipolar disorder with insomnia despite Seroquel . Clonazepam  effective for sleep  but increased appetite. Interested in psychiatric evaluation for medication adjustment. Discussed Abilify but advised against mixing with Seroquel  due to tardive dyskinesia risk. - Refer to psychiatry for medication management. - Avoid mixing Seroquel  with Abilify.  Orders: -     Ambulatory referral to Psychiatry   Follow up plan: Return in about 2 weeks (around 05/14/2024) for HTN.  Hadassah SHAUNNA Nett, MD

## 2024-04-30 NOTE — Patient Instructions (Addendum)
 Start olmesartan  20mg  again, continue amlodipine  10mg 

## 2024-05-01 ENCOUNTER — Other Ambulatory Visit: Payer: Self-pay | Admitting: Orthopedic Surgery

## 2024-05-01 DIAGNOSIS — M4807 Spinal stenosis, lumbosacral region: Secondary | ICD-10-CM

## 2024-05-01 DIAGNOSIS — M48062 Spinal stenosis, lumbar region with neurogenic claudication: Secondary | ICD-10-CM

## 2024-05-01 LAB — COMPREHENSIVE METABOLIC PANEL WITH GFR
ALT: 33 IU/L — ABNORMAL HIGH (ref 0–32)
AST: 43 IU/L — ABNORMAL HIGH (ref 0–40)
Albumin: 3.9 g/dL (ref 3.9–4.9)
Alkaline Phosphatase: 157 IU/L — ABNORMAL HIGH (ref 49–135)
BUN/Creatinine Ratio: 26 (ref 12–28)
BUN: 31 mg/dL — ABNORMAL HIGH (ref 8–27)
Bilirubin Total: <0.2 mg/dL (ref 0.0–1.2)
CO2: 19 mmol/L — ABNORMAL LOW (ref 20–29)
Calcium: 9.6 mg/dL (ref 8.7–10.3)
Chloride: 102 mmol/L (ref 96–106)
Creatinine, Ser: 1.2 mg/dL — ABNORMAL HIGH (ref 0.57–1.00)
Globulin, Total: 3 g/dL (ref 1.5–4.5)
Glucose: 85 mg/dL (ref 70–99)
Potassium: 5.3 mmol/L — ABNORMAL HIGH (ref 3.5–5.2)
Sodium: 135 mmol/L (ref 134–144)
Total Protein: 6.9 g/dL (ref 6.0–8.5)
eGFR: 51 mL/min/1.73 — ABNORMAL LOW (ref >59)

## 2024-05-01 LAB — HEPATITIS B CORE AB W/REFLEX: Hep B Core Total Ab: POSITIVE — AB

## 2024-05-01 LAB — MAGNESIUM: Magnesium: 1.7 mg/dL (ref 1.6–2.3)

## 2024-05-01 LAB — HBCIGM: Hep B C IgM: NEGATIVE

## 2024-05-02 LAB — HEPATITIS C ANTIBODY: Hep C Virus Ab: REACTIVE — AB

## 2024-05-02 LAB — SPECIMEN STATUS REPORT

## 2024-05-03 ENCOUNTER — Other Ambulatory Visit: Payer: Self-pay | Admitting: Pediatrics

## 2024-05-03 DIAGNOSIS — I10 Essential (primary) hypertension: Secondary | ICD-10-CM

## 2024-05-04 ENCOUNTER — Other Ambulatory Visit: Payer: Self-pay | Admitting: Pediatrics

## 2024-05-04 DIAGNOSIS — F1191 Opioid use, unspecified, in remission: Secondary | ICD-10-CM

## 2024-05-04 NOTE — Telephone Encounter (Unsigned)
 Copied from CRM [id number]. Topic: Clinical - Medication Refill >> May 04, 2024 11:03 AM Amy B wrote: Medication: Buprenorphine  HCl-Naloxone  HCl 8-2 MG FILM hydrochlorothiazide   Has the patient contacted their pharmacy? No (Agent: If no, request that the patient contact the pharmacy for the refill. If patient does not wish to contact the pharmacy document the reason why and proceed with request.) (Agent: If yes, when and what did the pharmacy advise?)  This is the patient's preferred pharmacy:  [id number] [place], [place] - [address] [place] [address] Phone: [phone] Fax: [phone]  Is this the correct pharmacy for this prescription? Yes If no, delete pharmacy and type the correct one.   Has the prescription been filled recently? No  Is the patient out of the medication? No  Has the patient been seen for an appointment in the last year OR does the patient have an upcoming appointment? Yes  Can we respond through MyChart? No  Agent: Please be advised that Rx refills may take up to 3 business days. We ask that you follow-up with your pharmacy.

## 2024-05-05 NOTE — Telephone Encounter (Signed)
 Requested medication (s) are due for refill today: yes  Requested medication (s) are on the active medication list: yes  Last refill:  04/07/24  Future visit scheduled: yes  Notes to clinic:  Unable to refill per protocol, cannot delegate.      Requested Prescriptions  Pending Prescriptions Disp Refills   Buprenorphine  HCl-Naloxone  HCl 8-2 MG FILM 60 Film 0    Sig: Place 1 Film under the tongue 2 (two) times daily.     Not Delegated - Analgesics:  Opioid Agonist Combinations Failed - 05/05/2024  4:14 PM      Failed - This refill cannot be delegated      Passed - Urine Drug Screen completed in last 360 days      Passed - Valid encounter within last 3 months    Recent Outpatient Visits           5 days ago Primary hypertension   [place] [facility] Herold Hadassah SQUIBB, MD   1 month ago Primary hypertension   [place] [facility] Herold Hadassah SQUIBB, MD   2 months ago Decubitus ulcer of left hip, stage 2 [facility])   [place] [facility] Herold Hadassah SQUIBB, MD   2 months ago Decubitus ulcer of left hip, stage 2 [facility])   [facility] Herold Hadassah SQUIBB, MD   2 months ago Decubitus ulcer of left hip, stage 2 [facility])    [facility] Herold Hadassah SQUIBB, MD

## 2024-05-05 NOTE — Telephone Encounter (Signed)
 Requested medications are due for refill today.  unsure  Requested medications are on the active medications list.  no  Last refill. 02/28/2024   Future visit scheduled.   yes  Notes to clinic.  Medication was d/c'd at discharge. Please review.    Requested Prescriptions  Pending Prescriptions Disp Refills   hydrochlorothiazide  (MICROZIDE ) 12.5 MG capsule [Pharmacy Med Name: HYDROCHLOROTHIAZIDE  12.5 MG CP] 90 capsule 1    Sig: TAKE 1 CAPSULE BY MOUTH EVERY DAY     Cardiovascular: Diuretics - Thiazide Failed - 05/05/2024 12:43 PM      Failed - Cr in normal range and within 180 days    Creat  Date Value Ref Range Status  07/30/2022 1.24 (H) 0.50 - 1.03 mg/dL Final   Creatinine, Ser  Date Value Ref Range Status  04/30/2024 1.20 (H) 0.57 - 1.00 mg/dL Final         Failed - K in normal range and within 180 days    Potassium  Date Value Ref Range Status  04/30/2024 5.3 (H) 3.5 - 5.2 mmol/L Final         Failed - Last BP in normal range    BP Readings from Last 1 Encounters:  04/30/24 (!) 157/102         Passed - Na in normal range and within 180 days    Sodium  Date Value Ref Range Status  04/30/2024 135 134 - 144 mmol/L Final         Passed - Valid encounter within last 6 months    Recent Outpatient Visits           5 days ago Primary hypertension   [place] [facility] Herold Hadassah SQUIBB, MD   1 month ago Primary hypertension   [place] [facility] Herold Hadassah SQUIBB, MD   2 months ago Decubitus ulcer of left hip, stage 2 [facility])   [place] [facility] Herold Hadassah SQUIBB, MD   2 months ago Decubitus ulcer of left hip, stage 2 [facility])   [place] [facility] Herold Hadassah SQUIBB, MD   2 months ago Decubitus ulcer of left hip, stage 2 [facility])   [place] [facility] Herold Hadassah SQUIBB, MD

## 2024-05-06 ENCOUNTER — Other Ambulatory Visit: Payer: Self-pay | Admitting: Pediatrics

## 2024-05-06 ENCOUNTER — Ambulatory Visit: Admitting: Pediatrics

## 2024-05-06 ENCOUNTER — Ambulatory Visit: Payer: Self-pay | Admitting: Pediatrics

## 2024-05-06 ENCOUNTER — Ambulatory Visit: Payer: Self-pay | Admitting: *Deleted

## 2024-05-06 DIAGNOSIS — L299 Pruritus, unspecified: Secondary | ICD-10-CM

## 2024-05-06 MED ORDER — BUPRENORPHINE HCL-NALOXONE HCL 8-2 MG SL FILM: 1.0000 | ORAL_FILM | Freq: Two times a day (BID) | SUBLINGUAL | 0 refills | Status: DC

## 2024-05-06 NOTE — Telephone Encounter (Unsigned)
 Copied from CRM [id number]. Topic: Clinical - Medication Refill >> May 06, 2024  9:48 AM Delon T wrote: Medication: naltrexone  (DEPADE) 50 MG tablet  hydrOXYzine  (ATARAX ) 25 MG tablet   Has the patient contacted their pharmacy? Yes (Agent: If no, request that the patient contact the pharmacy for the refill. If patient does not wish to contact the pharmacy document the reason why and proceed with request.) (Agent: If yes, when and what did the pharmacy advise?)  This is the patient's preferred pharmacy:  [id number] - [place], [place] - [address] [place] [address] Phone: [phone] Fax: [phone]  Is this the correct pharmacy for this prescription? Yes If no, delete pharmacy and type the correct one.   Has the prescription been filled recently? Yes  Is the patient out of the medication? Yes  Has the patient been seen for an appointment in the last year OR does the patient have an upcoming appointment? Yes  Can we respond through MyChart? No  Agent: Please be advised that Rx refills may take up to 3 business days. We ask that you follow-up with your pharmacy.

## 2024-05-06 NOTE — Telephone Encounter (Signed)
 FYI Only or Action Required?: FYI only for provider: appointment scheduled on 11/7.  Patient was last seen in primary care on 04/30/2024 by Herold Hadassah SQUIBB, MD.  Called Nurse Triage reporting Leg Swelling.  Symptoms began days ago.  Interventions attempted: Rest, hydration, or home remedies.  Symptoms are: unchanged.  Triage Disposition: See Physician Within 24 Hours  Patient/caregiver understands and will follow disposition?: Declines appointment within disposition- only wants to see PCP  Copied from CRM #[id number]. Topic: Clinical - Red Word Triage >> May 06, 2024 10:20 AM Nicole [name] wrote: Red Word that prompted transfer to Nurse Triage: Leg and ankle swelling, no improvement with elevation. Reason for Disposition  [1] MODERATE leg swelling (e.g., swelling extends up to knees) AND [2] new-onset or getting worse  Answer Assessment - Initial Assessment Questions 1. ONSET: When did the swelling start? (e.g., minutes, hours, days)     2 days ago 2. LOCATION: What part of the leg is swollen?  Are both legs swollen or just one leg?    bilateral 3. SEVERITY: How bad is the swelling? (e.g., localized; mild, moderate, severe)      Below the knee- skin tight 4. REDNESS: Is there redness or signs of infection?     Itching/scratching areas 5. PAIN: Is the swelling painful to touch? If Yes, ask: How painful is it?   (Scale 1-10; mild, moderate or severe)     Sore- more tight 6. FEVER: Do you have a fever? If Yes, ask: What is it, how was it measured, and when did it start?      no 7. CAUSE: What do you think is causing the leg swelling?     unsure 8. MEDICAL HISTORY: Do you have a history of blood clots (e.g., DVT), cancer, heart failure, kidney disease, or liver failure?     CHF 9. RECURRENT SYMPTOM: Have you had leg swelling before? If Yes, ask: When was the last time? What happened that time?     Yes-2 years ago- it went away 10. OTHER SYMPTOMS: Do you have any  other symptoms? (e.g., chest pain, difficulty breathing)       no  Protocols used: Leg Swelling and Edema-A-AH

## 2024-05-07 ENCOUNTER — Encounter: Payer: Self-pay | Admitting: Pediatrics

## 2024-05-07 MED ORDER — HYDROXYZINE HCL 25 MG PO TABS: 25.0000 mg | ORAL_TABLET | Freq: Three times a day (TID) | ORAL | 2 refills | Status: DC | PRN

## 2024-05-07 NOTE — Assessment & Plan Note (Signed)
 Bipolar disorder with insomnia despite Seroquel . Clonazepam  effective for sleep but increased appetite. Interested in psychiatric evaluation for medication adjustment. Discussed Abilify but advised against mixing with Seroquel  due to tardive dyskinesia risk. - Refer to psychiatry for medication management. - Avoid mixing Seroquel  with Abilify.

## 2024-05-07 NOTE — Assessment & Plan Note (Addendum)
 Recent admission for HTN urgency here for follow up. Hypertension poorly controlled. Hydrochlorothiazide  discontinued due to side effects. Olmesartan  preferred for better side effect profile. - Restart olmesartan  20 mg daily. - Continue amlodipine . - Check electrolytes today. - Follow up in 2 weeks to assess blood pressure control.

## 2024-05-08 ENCOUNTER — Ambulatory Visit: Admitting: Pediatrics

## 2024-05-08 ENCOUNTER — Encounter: Payer: Self-pay | Admitting: Pediatrics

## 2024-05-08 VITALS — BP 136/77 | HR 80 | Temp 98.0°F | Ht 62.0 in | Wt 124.4 lb

## 2024-05-08 DIAGNOSIS — I10 Essential (primary) hypertension: Secondary | ICD-10-CM | POA: Diagnosis not present

## 2024-05-08 DIAGNOSIS — R600 Localized edema: Secondary | ICD-10-CM | POA: Diagnosis not present

## 2024-05-08 NOTE — Progress Notes (Signed)
 Office Visit  BP 136/77   Pulse 80   Temp 98 F (36.7 C) (Oral)   Ht 5' 2 (1.575 m)   Wt 124 lb 6.4 oz (56.4 kg)   SpO2 96%   BMI 22.75 kg/m    Subjective:    Patient ID: Nicole [name], female    DOB: [birth year], 63 y.o.   MRN: [id number]  HPI: Nicole [name] is a 63 y.o. female  Chief Complaint  Patient presents with   Leg Swelling    Has been off and on for the last year. Worsening the beginning of this week.  Both legs. Right is worse Knees and Ankles     Discussed the use of AI scribe software for clinical note transcription with the patient, who gave verbal consent to proceed.  History of Present Illness   Nicole [name] is a 63 year old female with hypertension who presents with leg swelling.  She has experienced significant swelling in her legs over the past couple of days, describing them as 'huge' and 'dented all the way up to my knees.' The swelling has reduced after elevating her legs for the last two nights. The right leg is consistently worse, which she attributes to a previous scrape. She mentions losing four pounds, which she believes is due to fluid loss.  She has been experiencing increased urination, although she describes it as 'kind of' more than usual. She is currently taking amlodipine  and hydrochlorothiazide  12.5 mg, but forgot to switch to olmesartan  as previously planned. She also takes prednisone  and gabapentin .  She expresses concern about the swelling, stating 'it scared me cause it was bad.' She mentions a history of high blood pressure. She finds managing her medications challenging, stating 'God, I forgot. That's okay. As long as Dang, it's so much.'     Relevant past medical, surgical, family and social history reviewed and updated as indicated. Interim medical history since our last visit reviewed. Allergies and medications reviewed and updated.  ROS per HPI unless specifically indicated above     Objective:    BP  136/77   Pulse 80   Temp 98 F (36.7 C) (Oral)   Ht 5' 2 (1.575 m)   Wt 124 lb 6.4 oz (56.4 kg)   SpO2 96%   BMI 22.75 kg/m   Wt Readings from Last 3 Encounters:  05/08/24 124 lb 6.4 oz (56.4 kg)  04/30/24 128 lb 14.4 oz (58.5 kg)  04/22/24 119 lb 0.8 oz (54 kg)     Physical Exam Constitutional:      Appearance: Normal appearance.  HENT:     Head: Normocephalic and atraumatic.  Eyes:     Pupils: Pupils are equal, round, and reactive to light.  Cardiovascular:     Rate and Rhythm: Normal rate and regular rhythm.     Pulses: Normal pulses.     Heart sounds: Normal heart sounds.  Pulmonary:     Effort: Pulmonary effort is normal.     Breath sounds: Normal breath sounds.  Musculoskeletal:        General: Normal range of motion.     Cervical back: Normal range of motion.     Right lower leg: No edema.     Left lower leg: No edema.  Skin:    General: Skin is warm and dry.     Capillary Refill: Capillary refill takes less than 2 seconds.  Neurological:     General: No focal deficit present.  Mental Status: She is alert. Mental status is at baseline.  Psychiatric:        Mood and Affect: Mood normal.        Behavior: Behavior normal.         04/02/2024    2:41 PM 02/28/2024    2:32 PM 02/06/2024    2:54 PM 01/30/2024    2:49 PM 04/17/2022    5:16 PM  Depression screen PHQ 2/9  Decreased Interest 2 2 2 1 2   Down, Depressed, Hopeless 3 2 2 1 2   PHQ - 2 Score 5 4 4 2 4   Altered sleeping 3 3 0 1 3  Tired, decreased energy 3 2 3 1 3   Change in appetite 3 2 2 1 3   Feeling bad or failure about yourself  3 2 2 1 1   Trouble concentrating 2 2 2 2 3   Moving slowly or fidgety/restless 2 2 2 2 1   Suicidal thoughts 1 0 1 2 0  PHQ-9 Score 22  17  16  12  18    Difficult doing work/chores Somewhat difficult  Somewhat difficult Somewhat difficult      Data saved with a previous flowsheet row definition       04/02/2024    2:42 PM 02/28/2024    2:33 PM 02/06/2024    2:54 PM  01/30/2024    2:49 PM  GAD 7 : Generalized Anxiety Score  Nervous, Anxious, on Edge 3 2 2 1   Control/stop worrying 3 3 2 1   Worry too much - different things 3 3 2 1   Trouble relaxing 3 2 2 1   Restless 2 2 2 1   Easily annoyed or irritable 2 2 2 1   Afraid - awful might happen 3 2 2 1   Total GAD 7 Score 19 16 14 7   Anxiety Difficulty Very difficult Somewhat difficult Very difficult Somewhat difficult       Assessment & Plan:  Assessment & Plan   Lower extremity edema Significant swelling in lower extremities, improved with leg elevation. Possible contributors: hypertension, prednisone  use. Recent weight loss likely fluid loss. - Ordered blood work for kidney function. - Increased hydrochlorothiazide  to 25 mg. -     Brain natriuretic peptide -     Magnesium  -     Comprehensive metabolic panel with GFR  Primary hypertension Assessment & Plan: Blood pressure slightly above target. Current regimen: amlodipine , hydrochlorothiazide . Olmesartan  not started. BP control essential for edema and kidney health. - Discontinued olmesartan . - Continue amlodipine . - Increased hydrochlorothiazide  to 25 mg. - Scheduled follow-up in one month.    Follow up plan: 2-3 weeks  Nicole SHAUNNA Nett, MD

## 2024-05-08 NOTE — Patient Instructions (Signed)
 Go up on hydrochlorothiazide  25mg  daily

## 2024-05-09 ENCOUNTER — Ambulatory Visit
Admission: RE | Admit: 2024-05-09 | Discharge: 2024-05-09 | Disposition: A | Source: Ambulatory Visit | Attending: Orthopedic Surgery | Admitting: Orthopedic Surgery

## 2024-05-09 DIAGNOSIS — M48062 Spinal stenosis, lumbar region with neurogenic claudication: Secondary | ICD-10-CM

## 2024-05-09 DIAGNOSIS — M5127 Other intervertebral disc displacement, lumbosacral region: Secondary | ICD-10-CM | POA: Diagnosis not present

## 2024-05-09 DIAGNOSIS — M4807 Spinal stenosis, lumbosacral region: Secondary | ICD-10-CM

## 2024-05-09 DIAGNOSIS — M47817 Spondylosis without myelopathy or radiculopathy, lumbosacral region: Secondary | ICD-10-CM | POA: Diagnosis not present

## 2024-05-10 LAB — COMPREHENSIVE METABOLIC PANEL WITH GFR
ALT: 46 IU/L — ABNORMAL HIGH (ref 0–32)
AST: 73 IU/L — ABNORMAL HIGH (ref 0–40)
Albumin: 4 g/dL (ref 3.9–4.9)
Alkaline Phosphatase: 143 IU/L — ABNORMAL HIGH (ref 49–135)
BUN/Creatinine Ratio: 21 (ref 12–28)
BUN: 21 mg/dL (ref 8–27)
Bilirubin Total: 0.3 mg/dL (ref 0.0–1.2)
CO2: 24 mmol/L (ref 20–29)
Calcium: 9.6 mg/dL (ref 8.7–10.3)
Chloride: 101 mmol/L (ref 96–106)
Creatinine, Ser: 1.02 mg/dL — ABNORMAL HIGH (ref 0.57–1.00)
Globulin, Total: 3.2 g/dL (ref 1.5–4.5)
Glucose: 108 mg/dL — ABNORMAL HIGH (ref 70–99)
Potassium: 4.1 mmol/L (ref 3.5–5.2)
Sodium: 138 mmol/L (ref 134–144)
Total Protein: 7.2 g/dL (ref 6.0–8.5)
eGFR: 63 mL/min/1.73 (ref >59)

## 2024-05-10 LAB — MAGNESIUM: Magnesium: 1.7 mg/dL (ref 1.6–2.3)

## 2024-05-10 LAB — BRAIN NATRIURETIC PEPTIDE: BNP: 220.3 pg/mL — AB (ref 0.0–100.0)

## 2024-05-12 ENCOUNTER — Ambulatory Visit: Payer: Self-pay | Admitting: Pediatrics

## 2024-05-12 ENCOUNTER — Other Ambulatory Visit: Payer: Self-pay | Admitting: Pediatrics

## 2024-05-12 DIAGNOSIS — I10 Essential (primary) hypertension: Secondary | ICD-10-CM

## 2024-05-12 DIAGNOSIS — R600 Localized edema: Secondary | ICD-10-CM

## 2024-05-13 ENCOUNTER — Telehealth: Payer: Self-pay | Admitting: Pediatrics

## 2024-05-13 ENCOUNTER — Other Ambulatory Visit: Payer: Self-pay | Admitting: Pediatrics

## 2024-05-13 ENCOUNTER — Encounter: Payer: Self-pay | Admitting: Pediatrics

## 2024-05-13 DIAGNOSIS — K739 Chronic hepatitis, unspecified: Secondary | ICD-10-CM

## 2024-05-13 DIAGNOSIS — Z419 Encounter for procedure for purposes other than remedying health state, unspecified: Secondary | ICD-10-CM | POA: Diagnosis not present

## 2024-05-13 DIAGNOSIS — B1910 Unspecified viral hepatitis B without hepatic coma: Secondary | ICD-10-CM

## 2024-05-13 NOTE — Telephone Encounter (Unsigned)
 Copied from CRM [id number]. Topic: Clinical - Lab/Test Results >> May 13, 2024  3:34 PM Tiffini S wrote: Reason for CRM: Patient call back with questions about her lab results- have questions about swelling in her heart, ankles and legs- asked for information about referral and Hep B for infection  Please call the patient back at [phone] to discuss.

## 2024-05-13 NOTE — Progress Notes (Signed)
 Referral for ID given h/o hep c and unclear treatment.  Nicole SHAUNNA Nett, MD

## 2024-05-13 NOTE — Assessment & Plan Note (Signed)
 Blood pressure slightly above target. Current regimen: amlodipine , hydrochlorothiazide . Olmesartan  not started. BP control essential for edema and kidney health. - Discontinued olmesartan . - Continue amlodipine . - Increased hydrochlorothiazide  to 25 mg. - Scheduled follow-up in one month.

## 2024-05-14 ENCOUNTER — Ambulatory Visit: Admitting: Pediatrics

## 2024-05-14 NOTE — Telephone Encounter (Signed)
 Requested Prescriptions  Refused Prescriptions Disp Refills   olmesartan  (BENICAR ) 20 MG tablet [Pharmacy Med Name: OLMESARTAN  MEDOXOMIL 20 MG TAB] 90 tablet     Sig: TAKE 1 TABLET BY MOUTH EVERY DAY     Cardiovascular:  Angiotensin Receptor Blockers Failed - 05/14/2024  3:13 PM      Failed - Cr in normal range and within 180 days    Creat  Date Value Ref Range Status  07/30/2022 1.24 (H) 0.50 - 1.03 mg/dL Final   Creatinine, Ser  Date Value Ref Range Status  05/08/2024 1.02 (H) 0.57 - 1.00 mg/dL Final         Passed - K in normal range and within 180 days    Potassium  Date Value Ref Range Status  05/08/2024 4.1 3.5 - 5.2 mmol/L Final         Passed - Patient is not pregnant      Passed - Last BP in normal range    BP Readings from Last 1 Encounters:  05/08/24 136/77         Passed - Valid encounter within last 6 months    Recent Outpatient Visits           6 days ago Lower extremity edema   [place] [facility] Herold Hadassah SQUIBB, MD   2 weeks ago Primary hypertension   [place] [facility] Herold Hadassah SQUIBB, MD   1 month ago Primary hypertension   [place] [facility] Herold Hadassah SQUIBB, MD   2 months ago Decubitus ulcer of left hip, stage 2 [facility])   [place] [facility] Herold Hadassah SQUIBB, MD   3 months ago Decubitus ulcer of left hip, stage 2 [facility])   [place] [facility] Herold Hadassah SQUIBB, MD       Future Appointments             In 1 week Gollan, Timothy J, MD [facility]

## 2024-05-18 ENCOUNTER — Other Ambulatory Visit: Payer: Self-pay | Admitting: Pediatrics

## 2024-05-18 DIAGNOSIS — G4700 Insomnia, unspecified: Secondary | ICD-10-CM

## 2024-05-20 NOTE — Telephone Encounter (Signed)
 Requested medication (s) are due for refill today - no  Requested medication (s) are on the active medication list -yes  Future visit scheduled -yes  Last refill: 04/10/24 #30 2RF  Notes to clinic: non delegated Rx,   Pharmacy comment: REQUEST FOR 90 DAYS PRESCRIPTION.    Requested Prescriptions  Pending Prescriptions Disp Refills   SEROQUEL  200 MG tablet [Pharmacy Med Name: SEROQUEL  200 MG TABLET] 90 tablet 1    Sig: TAKE 1 TABLET BY MOUTH EVERYDAY AT BEDTIME     Not Delegated - Psychiatry:  Antipsychotics - Second Generation (Atypical) - quetiapine  Failed - 05/20/2024  1:38 PM      Failed - This refill cannot be delegated      Failed - Lipid Panel in normal range within the last 12 months    Cholesterol, Total  Date Value Ref Range Status  10/03/2021 151 100 - 199 mg/dL Final   Cholesterol  Date Value Ref Range Status  07/30/2022 161 <200 mg/dL Final   LDL Cholesterol (Calc)  Date Value Ref Range Status  07/30/2022 67 mg/dL (calc) Final    Comment:    Reference range: <100 . Desirable range <100 mg/dL for primary prevention;   <70 mg/dL for patients with CHD or diabetic patients  with > or = 2 CHD risk factors. SABRA LDL-C is now calculated using the Martin-Hopkins  calculation, which is a validated novel method providing  better accuracy than the Friedewald equation in the  estimation of LDL-C.  Gladis APPLETHWAITE et al. SANDREA. 7986;689(80): 2061-2068  (http://education.[facility].com/faq/FAQ164)    HDL  Date Value Ref Range Status  07/30/2022 70 > OR = 50 mg/dL Final  95/95/7976 77 >60 mg/dL Final   Triglycerides  Date Value Ref Range Status  07/30/2022 158 (H) <150 mg/dL Final         Failed - CMP within normal limits and completed in the last 12 months    Albumin  Date Value Ref Range Status  05/08/2024 4.0 3.9 - 4.9 g/dL Final   Alkaline Phosphatase  Date Value Ref Range Status  05/08/2024 143 (H) 49 - 135 IU/L Final   Alkaline phosphatase (APISO)   Date Value Ref Range Status  07/30/2022 112 37 - 153 U/L Final   ALT  Date Value Ref Range Status  05/08/2024 46 (H) 0 - 32 IU/L Final   AST  Date Value Ref Range Status  05/08/2024 73 (H) 0 - 40 IU/L Final   BUN  Date Value Ref Range Status  05/08/2024 21 8 - 27 mg/dL Final   Calcium  Date Value Ref Range Status  05/08/2024 9.6 8.7 - 10.3 mg/dL Final   CO2  Date Value Ref Range Status  05/08/2024 24 20 - 29 mmol/L Final   Bicarbonate  Date Value Ref Range Status  04/22/2024 25.3 20.0 - 28.0 mmol/L Final   Creat  Date Value Ref Range Status  07/30/2022 1.24 (H) 0.50 - 1.03 mg/dL Final   Creatinine, Ser  Date Value Ref Range Status  05/08/2024 1.02 (H) 0.57 - 1.00 mg/dL Final   Glucose  Date Value Ref Range Status  05/08/2024 108 (H) 70 - 99 mg/dL Final   Glucose, Bld  Date Value Ref Range Status  04/24/2024 118 (H) 70 - 99 mg/dL Final    Comment:    Glucose reference range applies only to samples taken after fasting for at least 8 hours.   Glucose-Capillary  Date Value Ref Range Status  04/22/2024 120 (H) 70 -  99 mg/dL Final    Comment:    Glucose reference range applies only to samples taken after fasting for at least 8 hours.   Potassium  Date Value Ref Range Status  05/08/2024 4.1 3.5 - 5.2 mmol/L Final   Sodium  Date Value Ref Range Status  05/08/2024 138 134 - 144 mmol/L Final   Bilirubin Total  Date Value Ref Range Status  05/08/2024 0.3 0.0 - 1.2 mg/dL Final   Protein, ur  Date Value Ref Range Status  04/22/2024 NEGATIVE NEGATIVE mg/dL Final   Total Protein  Date Value Ref Range Status  05/08/2024 7.2 6.0 - 8.5 g/dL Final   GFR calc Af Amer  Date Value Ref Range Status  01/24/2019 >60 >60 mL/min Final   eGFR  Date Value Ref Range Status  05/08/2024 63 >59 mL/min/1.73 Final   GFR, Estimated  Date Value Ref Range Status  04/24/2024 41 (L) >60 mL/min Final    Comment:    (NOTE) Calculated using the CKD-EPI Creatinine Equation  (2021)          Passed - TSH in normal range and within 360 days    TSH  Date Value Ref Range Status  04/23/2024 0.454 0.350 - 4.500 uIU/mL Final    Comment:    Performed by a 3rd Generation assay with a functional sensitivity of <=0.01 uIU/mL. Performed at [facility], [address] [place], [place] [address]   04/02/2024 0.219 (L) 0.450 - 4.500 uIU/mL Final         Passed - Completed PHQ-2 or PHQ-9 in the last 360 days      Passed - Last BP in normal range    BP Readings from Last 1 Encounters:  05/08/24 136/77         Passed - Last Heart Rate in normal range    Pulse Readings from Last 1 Encounters:  05/08/24 80         Passed - Valid encounter within last 6 months    Recent Outpatient Visits           1 week ago Lower extremity edema   [place] [facility] Herold Hadassah SQUIBB, MD   2 weeks ago Primary hypertension   [place] [facility] Herold Hadassah SQUIBB, MD   1 month ago Primary hypertension   [facility] Herold Hadassah SQUIBB, MD   2 months ago Decubitus ulcer of left hip, stage 2 (HCC)   [place] [facility] Herold Hadassah SQUIBB, MD   3 months ago Decubitus ulcer of left hip, stage 2 [facility])   [place] [facility] Herold Hadassah SQUIBB, MD       Future Appointments             In 6 days Gollan, Evalene PARAS, MD [facility] [place]   In 1 week Fayette Bodily, MD [facility]            Passed - CBC within normal limits and completed in the last 12 months    WBC  Date Value Ref Range Status  04/24/2024 7.5 4.0 - 10.5 K/uL Final   RBC  Date Value Ref Range Status  04/24/2024 4.08 3.87 - 5.11 MIL/uL Final   Hemoglobin  Date Value Ref Range Status  04/24/2024 13.2 12.0 - 15.0 g/dL Final  89/97/7974 87.6 11.1 - 15.9 g/dL Final   HCT  Date Value Ref Range Status  04/24/2024 38.4  36.0 - 46.0 % Final   Hematocrit  Date Value Ref  Range Status  04/02/2024 36.3 34.0 - 46.6 % Final   MCHC  Date Value Ref Range Status  04/24/2024 34.4 30.0 - 36.0 g/dL Final   [facility]  Date Value Ref Range Status  04/24/2024 32.4 26.0 - 34.0 pg Final   MCV  Date Value Ref Range Status  04/24/2024 94.1 80.0 - 100.0 fL Final  04/02/2024 98 (H) 79 - 97 fL Final   No results found for: PLTCOUNTKUC, LABPLAT, POCPLA RDW  Date Value Ref Range Status  04/24/2024 13.4 11.5 - 15.5 % Final  04/02/2024 12.6 11.7 - 15.4 % Final            Requested Prescriptions  Pending Prescriptions Disp Refills   SEROQUEL  200 MG tablet [Pharmacy Med Name: SEROQUEL  200 MG TABLET] 90 tablet 1    Sig: TAKE 1 TABLET BY MOUTH EVERYDAY AT BEDTIME     Not Delegated - Psychiatry:  Antipsychotics - Second Generation (Atypical) - quetiapine  Failed - 05/20/2024  1:38 PM      Failed - This refill cannot be delegated      Failed - Lipid Panel in normal range within the last 12 months    Cholesterol, Total  Date Value Ref Range Status  10/03/2021 151 100 - 199 mg/dL Final   Cholesterol  Date Value Ref Range Status  07/30/2022 161 <200 mg/dL Final   LDL Cholesterol (Calc)  Date Value Ref Range Status  07/30/2022 67 mg/dL (calc) Final    Comment:    Reference range: <100 . Desirable range <100 mg/dL for primary prevention;   <70 mg/dL for patients with CHD or diabetic patients  with > or = 2 CHD risk factors. SABRA LDL-C is now calculated using the Martin-Hopkins  calculation, which is a validated novel method providing  better accuracy than the Friedewald equation in the  estimation of LDL-C.  Gladis APPLETHWAITE et al. SANDREA. 7986;689(80): 2061-2068  (http://education.[facility].com/faq/FAQ164)    HDL  Date Value Ref Range Status  07/30/2022 70 > OR = 50 mg/dL Final  95/95/7976 77 >60 mg/dL Final   Triglycerides  Date Value Ref Range Status  07/30/2022 158 (H) <150 mg/dL Final         Failed - CMP within normal limits and completed in  the last 12 months    Albumin  Date Value Ref Range Status  05/08/2024 4.0 3.9 - 4.9 g/dL Final   Alkaline Phosphatase  Date Value Ref Range Status  05/08/2024 143 (H) 49 - 135 IU/L Final   Alkaline phosphatase (APISO)  Date Value Ref Range Status  07/30/2022 112 37 - 153 U/L Final   ALT  Date Value Ref Range Status  05/08/2024 46 (H) 0 - 32 IU/L Final   AST  Date Value Ref Range Status  05/08/2024 73 (H) 0 - 40 IU/L Final   BUN  Date Value Ref Range Status  05/08/2024 21 8 - 27 mg/dL Final   Calcium  Date Value Ref Range Status  05/08/2024 9.6 8.7 - 10.3 mg/dL Final   CO2  Date Value Ref Range Status  05/08/2024 24 20 - 29 mmol/L Final   Bicarbonate  Date Value Ref Range Status  04/22/2024 25.3 20.0 - 28.0 mmol/L Final   Creat  Date Value Ref Range Status  07/30/2022 1.24 (H) 0.50 - 1.03 mg/dL Final   Creatinine, Ser  Date Value Ref Range Status  05/08/2024 1.02 (H) 0.57 - 1.00 mg/dL  Final   Glucose  Date Value Ref Range Status  05/08/2024 108 (H) 70 - 99 mg/dL Final   Glucose, Bld  Date Value Ref Range Status  04/24/2024 118 (H) 70 - 99 mg/dL Final    Comment:    Glucose reference range applies only to samples taken after fasting for at least 8 hours.   Glucose-Capillary  Date Value Ref Range Status  04/22/2024 120 (H) 70 - 99 mg/dL Final    Comment:    Glucose reference range applies only to samples taken after fasting for at least 8 hours.   Potassium  Date Value Ref Range Status  05/08/2024 4.1 3.5 - 5.2 mmol/L Final   Sodium  Date Value Ref Range Status  05/08/2024 138 134 - 144 mmol/L Final   Bilirubin Total  Date Value Ref Range Status  05/08/2024 0.3 0.0 - 1.2 mg/dL Final   Protein, ur  Date Value Ref Range Status  04/22/2024 NEGATIVE NEGATIVE mg/dL Final   Total Protein  Date Value Ref Range Status  05/08/2024 7.2 6.0 - 8.5 g/dL Final   GFR calc Af Amer  Date Value Ref Range Status  01/24/2019 >60 >60 mL/min Final   eGFR   Date Value Ref Range Status  05/08/2024 63 >59 mL/min/1.73 Final   GFR, Estimated  Date Value Ref Range Status  04/24/2024 41 (L) >60 mL/min Final    Comment:    (NOTE) Calculated using the CKD-EPI Creatinine Equation (2021)          Passed - TSH in normal range and within 360 days    TSH  Date Value Ref Range Status  04/23/2024 0.454 0.350 - 4.500 uIU/mL Final    Comment:    Performed by a 3rd Generation assay with a functional sensitivity of <=0.01 uIU/mL. Performed at [facility], [address] [place], [place] [address]   04/02/2024 0.219 (L) 0.450 - 4.500 uIU/mL Final         Passed - Completed PHQ-2 or PHQ-9 in the last 360 days      Passed - Last BP in normal range    BP Readings from Last 1 Encounters:  05/08/24 136/77         Passed - Last Heart Rate in normal range    Pulse Readings from Last 1 Encounters:  05/08/24 80         Passed - Valid encounter within last 6 months    Recent Outpatient Visits           1 week ago Lower extremity edema   [place] [facility] Herold Hadassah SQUIBB, MD   2 weeks ago Primary hypertension   [place] [facility] Herold Hadassah SQUIBB, MD   1 month ago Primary hypertension   [place] [facility] Herold Hadassah SQUIBB, MD   2 months ago Decubitus ulcer of left hip, stage 2 [facility])   [place] [facility] Herold Hadassah SQUIBB, MD   3 months ago Decubitus ulcer of left hip, stage 2 [facility])   [place] [facility] Herold Hadassah SQUIBB, MD       Future Appointments             In 6 days Gollan, Timothy J, MD [place] [place]   In 1 week Fayette Bodily, MD [facility]            Passed - CBC within normal limits and completed in the  last 12 months    WBC  Date Value Ref Range Status  04/24/2024 7.5 4.0 - 10.5 K/uL Final   RBC  Date Value Ref Range Status  04/24/2024 4.08 3.87 - 5.11 MIL/uL Final    Hemoglobin  Date Value Ref Range Status  04/24/2024 13.2 12.0 - 15.0 g/dL Final  89/97/7974 87.6 11.1 - 15.9 g/dL Final   HCT  Date Value Ref Range Status  04/24/2024 38.4 36.0 - 46.0 % Final   Hematocrit  Date Value Ref Range Status  04/02/2024 36.3 34.0 - 46.6 % Final   MCHC  Date Value Ref Range Status  04/24/2024 34.4 30.0 - 36.0 g/dL Final   [facility]  Date Value Ref Range Status  04/24/2024 32.4 26.0 - 34.0 pg Final   MCV  Date Value Ref Range Status  04/24/2024 94.1 80.0 - 100.0 fL Final  04/02/2024 98 (H) 79 - 97 fL Final   No results found for: PLTCOUNTKUC, LABPLAT, POCPLA RDW  Date Value Ref Range Status  04/24/2024 13.4 11.5 - 15.5 % Final  04/02/2024 12.6 11.7 - 15.4 % Final

## 2024-05-24 DIAGNOSIS — N179 Acute kidney failure, unspecified: Secondary | ICD-10-CM | POA: Diagnosis not present

## 2024-05-24 DIAGNOSIS — E66813 Obesity, class 3: Secondary | ICD-10-CM | POA: Diagnosis not present

## 2024-05-24 DIAGNOSIS — R531 Weakness: Secondary | ICD-10-CM | POA: Diagnosis not present

## 2024-05-25 NOTE — Progress Notes (Deleted)
 Cardiology Office Note  Date:  05/25/2024   ID:  Nicole [name], DOB 29-Aug-1960, MRN [id number]  PCP:  Herold Hadassah SQUIBB, MD   No chief complaint on file.   HPI:  Nicole [name] is a 63 y.o. 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 Hadassah Herold for lower extremity edema  In the hospital April 24, 2024 hypertensive urgency, sinus tachycardia, alcohol withdrawal, metabolic encephalopathy, COPD In the hospital with confusion and agitation Blood pressure was elevated Initially treated with Cardene  drip, changed to Norvasc  with improved blood pressure Was told to stay away from canned tomato soup, was eating for day  MRI of the brain no acute abnormality Patient denied alcohol use On Suboxone , Seroquel , Remeron   For elevated creatinine, olmesartan  and hydrochlorothiazide  was held, continued on Norvasc   PMH:   has a past medical history of Alcohol withdrawal (HCC) (01/28/2022), Anxiety, Arthritis, Asthma, Bipolar disorder (HCC), Collagen vascular disease, COPD (chronic obstructive pulmonary disease) (HCC), Empyema (HCC) (09/05/2020), Hepatitis C, Hypertension, Korsakoff's psychosis, alcohol related (HCC) (10/22/2016), and Substance abuse (HCC).   PSH:    Past Surgical History:  Procedure Laterality Date   ABDOMINAL HYSTERECTOMY     ESOPHAGOGASTRODUODENOSCOPY N/A 01/16/2024   Procedure: EGD (ESOPHAGOGASTRODUODENOSCOPY);  Surgeon: Jinny Carmine, MD;  Location: [facility] ENDOSCOPY;  Service: Endoscopy;  Laterality: N/A;   TONSILLECTOMY     TONSILLECTOMY      Current Outpatient  Medications  Medication Sig Dispense Refill   albuterol  (PROVENTIL  HFA) 108 (90 Base) MCG/ACT inhaler Inhale 1-2 puffs into the lungs once every 6 (six) hours as needed for wheezing or shortness of breath. 6.7 g 5   amLODipine  (NORVASC ) 10 MG tablet Take 1 tablet (10 mg total) by mouth daily. 30 tablet 0   Blood Pressure KIT USE AS DIRECTED TWICE DAILY. 1 kit 0   budesonide -glycopyrrolate -formoterol  (BREZTRI  AEROSPHERE) 160-9-4.8 MCG/ACT AERO inhaler Inhale 2 puffs into the lungs 2 (two) times daily. 10.7 g 11   Buprenorphine  HCl-Naloxone  HCl 8-2 MG FILM Place 1 Film under the tongue 2 (two) times daily. 60 Film 0   folic acid  (FOLVITE ) 1 MG tablet Take 1 tablet (1 mg total) by mouth daily. 30 tablet 0   gabapentin  (NEURONTIN ) 100 MG capsule Take 100 mg by mouth 2 (two) times daily.     hydrOXYzine  (ATARAX ) 25 MG tablet Take 1 tablet (25 mg total) by mouth every 8 (eight) hours as needed for itching. 30 tablet 2   lidocaine -prilocaine  (EMLA ) cream Apply 1 Application topically as needed. 30 g 0   methocarbamol  (ROBAXIN ) 500 MG tablet Take 1 tablet (500 mg total) by mouth every 8 (eight) hours as needed for muscle spasms. 30 tablet 0   mirtazapine  (REMERON ) 30 MG tablet Take 30 mg by mouth at bedtime.     omeprazole  (PRILOSEC) 40 MG capsule Take 1 capsule (40 mg total) by mouth 2 (two) times daily. 180 capsule 1   predniSONE  (DELTASONE ) 10 MG tablet Take 10 mg by mouth daily with breakfast.     QUEtiapine  (SEROQUEL ) 200 MG tablet  TAKE 1 TABLET BY MOUTH EVERYDAY AT BEDTIME 30 tablet 2   thiamine  (VITAMIN B-1) 100 MG tablet Take 1 tablet (100 mg total) by mouth daily. 30 tablet 0   tiZANidine (ZANAFLEX) 4 MG tablet Take 4 mg by mouth once.     No current facility-administered medications for this visit.     Allergies:   Patient has no known allergies.   Social History:  The patient  reports that she has been smoking cigarettes. She has a 66 pack-year smoking history. She has never used  smokeless tobacco. She reports current alcohol use of about 4.0 standard drinks of alcohol per week. She reports current drug use. Drugs: Cocaine and Marijuana.   Family History:   family history includes Arthritis in her maternal grandmother; Bipolar disorder in her maternal grandmother and mother; Clotting disorder in an other family member; Congestive Heart Failure in her paternal grandfather; Depression in her maternal grandmother and mother; Heart attack in her mother and paternal grandmother; Hyperlipidemia in her paternal grandfather; Hypertension in her mother and paternal grandfather; Insomnia in her mother; Osteoporosis in an other family member; Other in her father and maternal grandfather; Ovarian cancer in her paternal grandmother; Parkinson's disease in her father; Rheum arthritis in an other family member; Stroke in an other family member; Thyroid  disease in her mother.    Review of Systems: ROS   PHYSICAL EXAM: VS:  There were no vitals taken for this visit. , BMI There is no height or weight on file to calculate BMI. GEN: Well nourished, well developed, in no acute distress HEENT: normal Neck: no JVD, carotid bruits, or masses Cardiac: RRR; no murmurs, rubs, or gallops,no edema  Respiratory:  clear to auscultation bilaterally, normal work of breathing GI: soft, nontender, nondistended, + BS MS: no deformity or atrophy Skin: warm and dry, no rash Neuro:  Strength and sensation are intact Psych: euthymic mood, full affect    Recent Labs: 04/23/2024: TSH 0.454 04/24/2024: Hemoglobin 13.2; Platelets 240 05/08/2024: ALT 46; BNP 220.3; BUN 21; Creatinine, Ser 1.02; Magnesium  1.7; Potassium 4.1; Sodium 138    Lipid Panel Lab Results  Component Value Date   CHOL 161 07/30/2022   HDL 70 07/30/2022   LDLCALC 67 07/30/2022   TRIG 158 (H) 07/30/2022      Wt Readings from Last 3 Encounters:  05/08/24 124 lb 6.4 oz (56.4 kg)  04/30/24 128 lb 14.4 oz (58.5 kg)  04/22/24  119 lb 0.8 oz (54 kg)       ASSESSMENT AND PLAN:  Problem List Items Addressed This Visit   None    Disposition:   F/U  12 months   Total encounter time more than 30 minutes  Greater than 50% was spent in counseling and coordination of care with the patient    Signed, Velinda Lunger, M.D., Ph.D. [facility] [place], [place] [phone]

## 2024-05-26 ENCOUNTER — Ambulatory Visit: Admitting: Cardiovascular Disease

## 2024-05-26 DIAGNOSIS — J449 Chronic obstructive pulmonary disease, unspecified: Secondary | ICD-10-CM

## 2024-05-26 DIAGNOSIS — I10 Essential (primary) hypertension: Secondary | ICD-10-CM

## 2024-05-26 DIAGNOSIS — Z8659 Personal history of other mental and behavioral disorders: Secondary | ICD-10-CM

## 2024-05-26 DIAGNOSIS — F1090 Alcohol use, unspecified, uncomplicated: Secondary | ICD-10-CM

## 2024-05-26 DIAGNOSIS — F17209 Nicotine dependence, unspecified, with unspecified nicotine-induced disorders: Secondary | ICD-10-CM

## 2024-05-29 DIAGNOSIS — N179 Acute kidney failure, unspecified: Secondary | ICD-10-CM | POA: Diagnosis not present

## 2024-05-29 DIAGNOSIS — E66813 Obesity, class 3: Secondary | ICD-10-CM | POA: Diagnosis not present

## 2024-05-29 DIAGNOSIS — R531 Weakness: Secondary | ICD-10-CM | POA: Diagnosis not present

## 2024-06-02 ENCOUNTER — Ambulatory Visit: Admitting: Infectious Diseases

## 2024-06-05 ENCOUNTER — Ambulatory Visit: Admitting: Nurse Practitioner

## 2024-06-05 ENCOUNTER — Other Ambulatory Visit: Payer: Self-pay | Admitting: Pediatrics

## 2024-06-05 ENCOUNTER — Encounter: Payer: Self-pay | Admitting: Nurse Practitioner

## 2024-06-05 ENCOUNTER — Telehealth: Payer: Self-pay | Admitting: Pediatrics

## 2024-06-05 VITALS — BP 142/77 | HR 97 | Temp 98.1°F | Ht 62.01 in | Wt 125.8 lb

## 2024-06-05 DIAGNOSIS — S0101XA Laceration without foreign body of scalp, initial encounter: Secondary | ICD-10-CM | POA: Diagnosis not present

## 2024-06-05 DIAGNOSIS — R002 Palpitations: Secondary | ICD-10-CM | POA: Diagnosis not present

## 2024-06-05 DIAGNOSIS — L299 Pruritus, unspecified: Secondary | ICD-10-CM

## 2024-06-05 DIAGNOSIS — S0990XA Unspecified injury of head, initial encounter: Secondary | ICD-10-CM | POA: Diagnosis not present

## 2024-06-05 DIAGNOSIS — F1191 Opioid use, unspecified, in remission: Secondary | ICD-10-CM

## 2024-06-05 MED ORDER — HYDROCHLOROTHIAZIDE 12.5 MG PO CAPS: 12.5000 mg | ORAL_CAPSULE | Freq: Every day | ORAL | 0 refills | Status: DC

## 2024-06-05 MED ORDER — HYDROXYZINE HCL 25 MG PO TABS: 25.0000 mg | ORAL_TABLET | Freq: Three times a day (TID) | ORAL | 2 refills | Status: AC | PRN

## 2024-06-05 MED ORDER — AMLODIPINE BESYLATE 10 MG PO TABS: 10.0000 mg | ORAL_TABLET | Freq: Every day | ORAL | 0 refills | Status: DC

## 2024-06-05 NOTE — Telephone Encounter (Signed)
 Copied from CRM [id number]. Topic: Clinical - Refused Triage >> Jun 05, 2024  1:53 PM Selinda RAMAN wrote: Patient/caller voiced complaints of: Declined transfer to triage. The patient called in stating she had a fall on Saturday 11/29 and busted open her head. She didn't go to the ER due to fear but even though her head has scabbed over she still complains of headache, some dizziness and the scab is really bothering her. Her son wrapped her head when it happened but she says it still is bothering her. She declined triage stating she just wants to see a provider and show them. I was able to schedule her today at 3:20 with provider Darice Petty and let [place] know of this. Please assist patient further

## 2024-06-05 NOTE — Progress Notes (Signed)
 BP (!) 142/77 (BP Location: Left Arm, Cuff Size: Normal)   Pulse 97   Temp 98.1 F (36.7 C) (Oral)   Ht 5' 2.01 (1.575 m)   Wt 125 lb 12.8 oz (57.1 kg)   SpO2 98%   BMI 23.00 kg/m    Subjective:    Patient ID: Nicole [name], female    DOB: [birth year], 63 y.o.   MRN: [id number]  HPI: Nicole [name] is a 63 y.o. female  Chief Complaint  Patient presents with   Head Injury    Split open head(declined triage). Patient stated when bled so much it went through the bandage. She fell Saturday night and she has a knot on the side of her head, she is in pain, and has dizzy spells. She has a large wound from the front to the center of her skull.    Palpitations    Patient stated she also has heart palpitations and she doesn't know why. She passed out when she got her head injury.   Patient states she fell on Saturday night and bled through bandages.  She did not go to the ED at that time.  She is having a headache, dizziness.  States her head has a large scab on her head but doesn't want it removed.  She is afraid she will bleed a lot. Endorses palpitations.  Denies blurred vision, nausea or vomiting.   Relevant past medical, surgical, family and social history reviewed and updated as indicated. Interim medical history since our last visit reviewed. Allergies and medications reviewed and updated.  Review of Systems  Eyes:  Negative for visual disturbance.  Cardiovascular:  Positive for palpitations.  Gastrointestinal:  Negative for nausea and vomiting.  Neurological:  Positive for dizziness and headaches.    Per HPI unless specifically indicated above     Objective:    BP (!) 142/77 (BP Location: Left Arm, Cuff Size: Normal)   Pulse 97   Temp 98.1 F (36.7 C) (Oral)   Ht 5' 2.01 (1.575 m)   Wt 125 lb 12.8 oz (57.1 kg)   SpO2 98%   BMI 23.00 kg/m   Wt Readings from Last 3 Encounters:  06/05/24 125 lb 12.8 oz (57.1 kg)  05/08/24 124 lb 6.4 oz (56.4 kg)   04/30/24 128 lb 14.4 oz (58.5 kg)    Physical Exam Vitals and nursing note reviewed.  Constitutional:      General: She is not in acute distress.    Appearance: Normal appearance. She is normal weight. She is not ill-appearing, toxic-appearing or diaphoretic.  HENT:     Head: Normocephalic.     Right Ear: External ear normal.     Left Ear: External ear normal.     Nose: Nose normal.     Mouth/Throat:     Mouth: Mucous membranes are moist.     Pharynx: Oropharynx is clear.  Eyes:     General:        Right eye: No discharge.        Left eye: No discharge.     Extraocular Movements: Extraocular movements intact.     Conjunctiva/sclera: Conjunctivae normal.     Pupils: Pupils are equal, round, and reactive to light.  Cardiovascular:     Rate and Rhythm: Normal rate and regular rhythm.     Heart sounds: No murmur heard. Pulmonary:     Effort: Pulmonary effort is normal. No respiratory distress.     Breath sounds: Normal breath sounds. No  wheezing or rales.  Musculoskeletal:     Cervical back: Normal range of motion and neck supple.  Skin:    General: Skin is warm and dry.     Capillary Refill: Capillary refill takes less than 2 seconds.      Neurological:     General: No focal deficit present.     Mental Status: She is alert and oriented to person, place, and time. Mental status is at baseline.     Cranial Nerves: Cranial nerves 2-12 are intact.     Sensory: Sensation is intact.     Motor: Motor function is intact.     Coordination: Coordination is intact.     Gait: Gait is intact.  Psychiatric:        Mood and Affect: Mood normal.        Behavior: Behavior normal.        Thought Content: Thought content normal.        Judgment: Judgment normal.     Results for orders placed or performed in visit on 05/08/24  B Nat Peptide   Collection Time: 05/08/24 10:03 AM  Result Value Ref Range   BNP 220.3 (H) 0.0 - 100.0 pg/mL  Magnesium    Collection Time: 05/08/24 10:03 AM   Result Value Ref Range   Magnesium  1.7 1.6 - 2.3 mg/dL  Comp Met (CMET)   Collection Time: 05/08/24 10:03 AM  Result Value Ref Range   Glucose 108 (H) 70 - 99 mg/dL   BUN 21 8 - 27 mg/dL   Creatinine, Ser 8.97 (H) 0.57 - 1.00 mg/dL   eGFR 63 >40 fO/fpw/8.26   BUN/Creatinine Ratio 21 12 - 28   Sodium 138 134 - 144 mmol/L   Potassium 4.1 3.5 - 5.2 mmol/L   Chloride 101 96 - 106 mmol/L   CO2 24 20 - 29 mmol/L   Calcium 9.6 8.7 - 10.3 mg/dL   Total Protein 7.2 6.0 - 8.5 g/dL   Albumin 4.0 3.9 - 4.9 g/dL   Globulin, Total 3.2 1.5 - 4.5 g/dL   Bilirubin Total 0.3 0.0 - 1.2 mg/dL   Alkaline Phosphatase 143 (H) 49 - 135 IU/L   AST 73 (H) 0 - 40 IU/L   ALT 46 (H) 0 - 32 IU/L      Assessment & Plan:   Problem List Items Addressed This Visit       Other   Generalized pruritus   Relevant Medications   hydrOXYzine  (ATARAX ) 25 MG tablet   Other Visit Diagnoses       Injury of head, initial encounter    -  Primary   Occured 6 days ago. Recommended patient be seen in the ER for imaging. Patient declined. CT head ordered during visit. CBC/CMP checked today.   Relevant Orders   Comp Met (CMET)   CBC w/Diff   CT HEAD WO CONTRAST ( )     Laceration of scalp without foreign body, initial encounter       Difficult to evaluate due to dried blood and matted hair.  Recommended ED evaluation.  Patient declined. Keep follow up with PCP for next week     Palpitations       Keep appointment with Cardiology for Monday.        Follow up plan: Return for Keep follow up next week with Dr Herold.

## 2024-06-06 LAB — CBC WITH DIFFERENTIAL/PLATELET
Basophils Absolute: 0 x10E3/uL (ref 0.0–0.2)
Basos: 0 %
EOS (ABSOLUTE): 0.2 x10E3/uL (ref 0.0–0.4)
Eos: 2 %
Hematocrit: 22.8 % — ABNORMAL LOW (ref 34.0–46.6)
Hemoglobin: 7.6 g/dL — ABNORMAL LOW (ref 11.1–15.9)
Immature Grans (Abs): 0.1 x10E3/uL (ref 0.0–0.1)
Immature Granulocytes: 1 %
Lymphocytes Absolute: 2.6 x10E3/uL (ref 0.7–3.1)
Lymphs: 27 %
MCH: 33 pg (ref 26.6–33.0)
MCHC: 33.3 g/dL (ref 31.5–35.7)
MCV: 99 fL — ABNORMAL HIGH (ref 79–97)
Monocytes Absolute: 0.7 x10E3/uL (ref 0.1–0.9)
Monocytes: 8 %
Neutrophils Absolute: 5.9 x10E3/uL (ref 1.4–7.0)
Neutrophils: 62 %
Platelets: 258 x10E3/uL (ref 150–450)
RBC: 2.3 x10E6/uL — CL (ref 3.77–5.28)
RDW: 14 % (ref 11.7–15.4)
WBC: 9.6 x10E3/uL (ref 3.4–10.8)

## 2024-06-06 LAB — COMPREHENSIVE METABOLIC PANEL WITH GFR
ALT: 27 IU/L (ref 0–32)
AST: 41 IU/L — ABNORMAL HIGH (ref 0–40)
Albumin: 3.8 g/dL — ABNORMAL LOW (ref 3.9–4.9)
Alkaline Phosphatase: 122 IU/L (ref 49–135)
BUN/Creatinine Ratio: 22 (ref 12–28)
BUN: 35 mg/dL — ABNORMAL HIGH (ref 8–27)
Bilirubin Total: 0.2 mg/dL (ref 0.0–1.2)
CO2: 21 mmol/L (ref 20–29)
Calcium: 8.9 mg/dL (ref 8.7–10.3)
Chloride: 104 mmol/L (ref 96–106)
Creatinine, Ser: 1.57 mg/dL — ABNORMAL HIGH (ref 0.57–1.00)
Globulin, Total: 2.7 g/dL (ref 1.5–4.5)
Glucose: 127 mg/dL — ABNORMAL HIGH (ref 70–99)
Potassium: 4 mmol/L (ref 3.5–5.2)
Sodium: 138 mmol/L (ref 134–144)
Total Protein: 6.5 g/dL (ref 6.0–8.5)
eGFR: 37 mL/min/1.73 — ABNORMAL LOW (ref >59)

## 2024-06-06 NOTE — Progress Notes (Unsigned)
 Cardiology Office Note  Date:  06/08/2024   ID:  [name], DOB 04-25-1961, MRN [id number]  PCP:  Herold Hadassah SQUIBB, MD   Chief Complaint  Patient presents with   New Patient (Initial Visit)    Ref by Dr. Herold for LE edema & HTN. Patient c/o bilateral LE edema and has shortness of breath.     HPI:  Nicole [name] is a 63 y.o. 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 Hadassah Herold for lower extremity edema  In the hospital April 24, 2024 hypertensive urgency, sinus tachycardia, alcohol withdrawal, metabolic encephalopathy, COPD In the hospital with confusion and agitation Blood pressure was elevated Initially treated with Cardene  drip, changed to Norvasc  with improved blood pressure Was told to stay away from canned tomato soup, was eating for day  MRI of the brain no acute abnormality Patient denied alcohol use On Suboxone , Seroquel , Remeron   For elevated creatinine, olmesartan  and hydrochlorothiazide  was held, continued on Norvasc   Fall one week ago, got dizzy  In follow-up today she reports that she continues on amlodipine , is also taking hydrochlorothiazide  but stopped her own losartan  Lab work in the past week showing climbing creatinine up to 1.5 Acute drop in hemoglobin from 13 down to 7.6 on December 5 Denies bleeding, no black stools, no hematuria Was referred to hematology by primary care  EKG personally reviewed by myself on todays visit        PMH:   has a past medical history of Alcohol withdrawal (HCC) (01/28/2022), Anxiety, Arthritis, Asthma,  Bipolar disorder (HCC), Collagen vascular disease, COPD (chronic obstructive pulmonary disease) (HCC), Empyema (HCC) (09/05/2020), Hepatitis C, Hypertension, Korsakoff's psychosis, alcohol related (HCC) (10/22/2016), and Substance abuse (HCC).   PSH:    Past Surgical History:  Procedure Laterality Date   ABDOMINAL HYSTERECTOMY     ESOPHAGOGASTRODUODENOSCOPY N/A 01/16/2024   Procedure: EGD (ESOPHAGOGASTRODUODENOSCOPY);  Surgeon: Jinny Carmine, MD;  Location: [facility] ENDOSCOPY;  Service: Endoscopy;  Laterality: N/A;   TONSILLECTOMY     TONSILLECTOMY      Current Outpatient Medications  Medication Sig Dispense Refill   albuterol  (PROVENTIL  HFA) 108 (90 Base) MCG/ACT inhaler Inhale 1-2 puffs into the lungs once every 6 (six) hours as needed for wheezing or shortness of breath. 6.7 g 5   amLODipine  (NORVASC ) 10 MG tablet Take 1 tablet (10 mg total) by mouth daily. 30 tablet 0   budesonide -glycopyrrolate -formoterol  (BREZTRI  AEROSPHERE) 160-9-4.8 MCG/ACT AERO inhaler Inhale 2 puffs into the lungs 2 (two) times daily. 10.7 g 11   Buprenorphine  HCl-Naloxone  HCl 8-2 MG FILM Place under the tongue.     gabapentin  (NEURONTIN ) 300 MG capsule Take by mouth.     hydrochlorothiazide  (MICROZIDE ) 12.5 MG capsule Take 1 capsule (12.5 mg total) by mouth daily. 30 capsule 0   hydrOXYzine  (ATARAX ) 25 MG tablet Take 1 tablet (25 mg total) by mouth every 8 (eight) hours as needed for itching. 30 tablet 2   Multiple Vitamin (MULTIVITAMIN) tablet Take 1 tablet by mouth daily.     omeprazole  (PRILOSEC)  40 MG capsule Take 1 capsule (40 mg total) by mouth 2 (two) times daily. 180 capsule 1   SEROQUEL  200 MG tablet TAKE 1 TABLET BY MOUTH EVERYDAY AT BEDTIME 90 tablet 1   Blood Pressure KIT USE AS DIRECTED TWICE DAILY. (Patient not taking: Reported on 06/08/2024) 1 kit 0   folic acid  (FOLVITE ) 1 MG tablet Take 1 tablet (1 mg total) by mouth daily. (Patient not taking: Reported on 06/08/2024) 30 tablet 0   lidocaine -prilocaine   (EMLA ) cream Apply 1 Application topically as needed. (Patient not taking: Reported on 06/08/2024) 30 g 0   methocarbamol  (ROBAXIN ) 500 MG tablet Take 1 tablet (500 mg total) by mouth every 8 (eight) hours as needed for muscle spasms. (Patient not taking: Reported on 06/08/2024) 30 tablet 0   mirtazapine  (REMERON ) 30 MG tablet Take 30 mg by mouth at bedtime. (Patient not taking: Reported on 06/08/2024)     naltrexone  (DEPADE) 50 MG tablet Take 50 mg by mouth daily. (Patient not taking: Reported on 06/08/2024)     predniSONE  (DELTASONE ) 10 MG tablet Take 10 mg by mouth daily with breakfast. (Patient not taking: Reported on 06/08/2024)     thiamine  (VITAMIN B-1) 100 MG tablet Take 1 tablet (100 mg total) by mouth daily. (Patient not taking: Reported on 06/08/2024) 30 tablet 0   tiZANidine (ZANAFLEX) 4 MG tablet Take 4 mg by mouth once. (Patient not taking: Reported on 06/08/2024)     No current facility-administered medications for this visit.   Allergies:   Patient has no known allergies.   Social History:  The patient  reports that she has been smoking cigarettes. She has a 66 pack-year smoking history. She has never used smokeless tobacco. She reports that she does not currently use alcohol after a past usage of about 4.0 standard drinks of alcohol per week. She reports that she does not currently use drugs after having used the following drugs: Cocaine and Marijuana.   Family History:   family history includes Arthritis in her maternal grandmother; Bipolar disorder in her maternal grandmother and mother; Clotting disorder in an other family member; Congestive Heart Failure in her paternal grandfather; Depression in her maternal grandmother and mother; Heart attack in her mother and paternal grandmother; Hyperlipidemia in her paternal grandfather; Hypertension in her mother and paternal grandfather; Insomnia in her mother; Osteoporosis in an other family member; Other in her father and maternal grandfather;  Ovarian cancer in her paternal grandmother; Parkinson's disease in her father; Rheum arthritis in an other family member; Stroke in an other family member; Thyroid  disease in her mother.    Review of Systems: Review of Systems  Constitutional: Negative.   HENT: Negative.    Respiratory: Negative.    Cardiovascular: Negative.   Gastrointestinal: Negative.   Musculoskeletal: Negative.   Neurological: Negative.   Psychiatric/Behavioral: Negative.    All other systems reviewed and are negative.    PHYSICAL EXAM: VS:  BP 120/68 (BP Location: Right Arm, Patient Position: Sitting, Cuff Size: Normal)   Pulse 92   Ht 5' 3 (1.6 m)   Wt 132 lb (59.9 kg)   SpO2 98%   BMI 23.38 kg/m  , BMI Body mass index is 23.38 kg/m. GEN: Well nourished, well developed, in no acute distress HEENT: normal Neck: no JVD, carotid bruits, or masses Cardiac: RRR; no murmurs, rubs, or gallops,no edema  Respiratory:  clear to auscultation bilaterally, normal work of breathing GI: soft, nontender, nondistended, + BS MS: no deformity or atrophy Skin:  warm and dry, no rash Neuro:  Strength and sensation are intact Psych: euthymic mood, full affect  Recent Labs: 04/23/2024: TSH 0.454 05/08/2024: BNP 220.3; Magnesium  1.7 06/05/2024: ALT 27; BUN 35; Creatinine, Ser 1.57; Hemoglobin 7.6; Platelets 258; Potassium 4.0; Sodium 138    Lipid Panel Lab Results  Component Value Date   CHOL 161 07/30/2022   HDL 70 07/30/2022   LDLCALC 67 07/30/2022   TRIG 158 (H) 07/30/2022      Wt Readings from Last 3 Encounters:  06/08/24 132 lb (59.9 kg)  06/05/24 125 lb 12.8 oz (57.1 kg)  05/08/24 124 lb 6.4 oz (56.4 kg)       ASSESSMENT AND PLAN:  Problem List Items Addressed This Visit       Cardiology Problems   Hypertension - Primary   Relevant Orders   EKG 12-Lead (Completed)     Other   Nicotine  dependence   COPD (chronic obstructive pulmonary disease) (HCC)   Alcohol use disorder   Acute  anemia Denies any GI bleeding symptoms Has been referred to hematology by primary care Will repeat hemoglobin today with iron studies -She does appear mildly pale on exam  Essential hypertension -Leg swelling likely secondary to amlodipine  - Worsening renal function on hydrochlorothiazide  - Recommended she stop both amlodipine  and hydrochlorothiazide  - Suggested she restart olmesartan  20 daily, start Cardura  2 twice daily in effort to minimize side effects - She will monitor blood pressure at home and call us  with numbers  Smoker/COPD We have encouraged her to continue to work on weaning her cigarettes and smoking cessation. She will continue to work on this and does not want any assistance with chantix.    Alcohol use disorder Cessation recommended   Signed, Velinda Lunger, M.D., Ph.D. [facility] [place], [place] [phone]

## 2024-06-08 ENCOUNTER — Ambulatory Visit: Payer: Self-pay | Admitting: Nurse Practitioner

## 2024-06-08 ENCOUNTER — Ambulatory Visit: Attending: Cardiovascular Disease | Admitting: Cardiovascular Disease

## 2024-06-08 ENCOUNTER — Encounter: Payer: Self-pay | Admitting: Cardiovascular Disease

## 2024-06-08 VITALS — BP 120/68 | HR 92 | Ht 63.0 in | Wt 132.0 lb

## 2024-06-08 DIAGNOSIS — F1090 Alcohol use, unspecified, uncomplicated: Secondary | ICD-10-CM

## 2024-06-08 DIAGNOSIS — Z79899 Other long term (current) drug therapy: Secondary | ICD-10-CM

## 2024-06-08 DIAGNOSIS — F17209 Nicotine dependence, unspecified, with unspecified nicotine-induced disorders: Secondary | ICD-10-CM | POA: Diagnosis not present

## 2024-06-08 DIAGNOSIS — D649 Anemia, unspecified: Secondary | ICD-10-CM

## 2024-06-08 DIAGNOSIS — J449 Chronic obstructive pulmonary disease, unspecified: Secondary | ICD-10-CM | POA: Diagnosis not present

## 2024-06-08 DIAGNOSIS — I10 Essential (primary) hypertension: Secondary | ICD-10-CM | POA: Diagnosis not present

## 2024-06-08 MED ORDER — OLMESARTAN MEDOXOMIL 20 MG PO TABS: 20.0000 mg | ORAL_TABLET | Freq: Every day | ORAL | 3 refills | Status: AC

## 2024-06-08 MED ORDER — DOXAZOSIN MESYLATE 2 MG PO TABS: 2.0000 mg | ORAL_TABLET | Freq: Every day | ORAL | 3 refills | Status: AC

## 2024-06-08 NOTE — Patient Instructions (Addendum)
  Medication Instructions:  Stop amlodipine  (can cause swelling) Stop the hydrochlorothiazide  (can mess with kidney)  Start cardura /doxazosin  2 mg twice a day Restart the olmesartan  20 mg daily  If you need a refill on your cardiac medications before your next appointment, please call your pharmacy.   Lab work: CBC, iron studies  Testing/Procedures: No new testing needed  Follow-Up: At [facility], you and your health needs are our priority.  As part of our continuing mission to provide you with exceptional heart care, we have created designated Provider Care Teams.  These Care Teams include your primary Cardiologist (physician) and Advanced Practice Providers (APPs -  Physician Assistants and Nurse Practitioners) who all work together to provide you with the care you need, when you need it.  You will need a follow up appointment in 6 months (PA ok)  Providers on your designated Care Team:   Lonni Meager, NP Bernardino Bring, PA-C Cadence [place], [place]  COVID-19 Vaccine Information can be found at: [email or web address] For questions related to vaccine distribution or appointments, please email [email or web address] or call [phone].

## 2024-06-08 NOTE — Telephone Encounter (Signed)
 Requested medication (s) are due for refill today:unknown  Requested medication (s) are on the active medication list: only as historical at this time  Last refill:  only as historical at this time  Future visit if scheduled: 06/10/2024  rtant  Maximum MME cannot be calculated for this prescription. Enter discrete sig details to calculate maximum MME.      Disp: 60 Film    Refills: 0   Start: 06/05/2024   Class: Normal   PDMP Review May Be Needed   For: Opioid use disorder in remission   To pharmacy: Not to exceed 5 additional fills before 11/02/2024 DX Code Needed  REQUESTED BY PT.   Last ordered: 1 month ago (05/06/2024) by Hadassah SHAUNNA Nett, MD   Last refill: 05/06/2024   Rx #: [id number]   Not Delegated - Analgesics:  Opioid Agonist Combinations Failed12/10/2023 05:39 PM  Protocol Details This refill cannot be delegated   Urine Drug Screen completed in last 360 days   Valid encounter within last 3 months    To be filled at: [id number] - [place], [place] - [address]

## 2024-06-09 LAB — CBC
Hematocrit: 26.2 % — ABNORMAL LOW (ref 34.0–46.6)
Hemoglobin: 8.3 g/dL — ABNORMAL LOW (ref 11.1–15.9)
MCH: 32 pg (ref 26.6–33.0)
MCHC: 31.7 g/dL (ref 31.5–35.7)
MCV: 101 fL — ABNORMAL HIGH (ref 79–97)
Platelets: 315 x10E3/uL (ref 150–450)
RBC: 2.59 x10E6/uL — CL (ref 3.77–5.28)
RDW: 14.1 % (ref 11.7–15.4)
WBC: 6.7 x10E3/uL (ref 3.4–10.8)

## 2024-06-09 LAB — IRON,TIBC AND FERRITIN PANEL
Ferritin: 186 ng/mL — ABNORMAL HIGH (ref 15–150)
Iron Saturation: 7 % — CL (ref 15–55)
Iron: 23 ug/dL — ABNORMAL LOW (ref 27–139)
Total Iron Binding Capacity: 317 ug/dL (ref 250–450)
UIBC: 294 ug/dL (ref 118–369)

## 2024-06-10 ENCOUNTER — Ambulatory Visit: Payer: Self-pay | Admitting: Cardiovascular Disease

## 2024-06-10 ENCOUNTER — Ambulatory Visit: Admitting: Pediatrics

## 2024-06-10 ENCOUNTER — Encounter: Payer: Self-pay | Admitting: Pediatrics

## 2024-06-10 VITALS — BP 108/67 | HR 83 | Temp 98.1°F | Ht 62.5 in | Wt 132.8 lb

## 2024-06-10 DIAGNOSIS — S0101XD Laceration without foreign body of scalp, subsequent encounter: Secondary | ICD-10-CM

## 2024-06-10 DIAGNOSIS — S0990XD Unspecified injury of head, subsequent encounter: Secondary | ICD-10-CM

## 2024-06-10 DIAGNOSIS — D509 Iron deficiency anemia, unspecified: Secondary | ICD-10-CM | POA: Diagnosis not present

## 2024-06-10 NOTE — Progress Notes (Signed)
 Office Visit  BP 108/67 (BP Location: Right Arm, Cuff Size: Normal)   Pulse 83   Temp 98.1 F (36.7 C) (Oral)   Ht 5' 2.5 (1.588 m)   Wt 132 lb 12.8 oz (60.2 kg)   SpO2 97%   BMI 23.90 kg/m    Subjective:    Patient ID: Nicole [name], female    DOB: 1961-05-17, 63 y.o.   MRN: [id number]  HPI: Nicole [name] is a 63 y.o. female  Chief Complaint  Patient presents with   Hypertension   Edema   COPD   Head Laceration    Discussed the use of AI scribe software for clinical note transcription with the patient, who gave verbal consent to proceed.  History of Present Illness   Nicole [name] is a 63 year old female who presents with a head wound following a fall.  Approximately a week and a half ago, she experienced dizziness and fainting, leading to a fall. Since the fall, she has had a persistent open and painful head wound. Her husband noted visible tissue in the wound, and her son assisted in wrapping her head. She did not seek immediate medical attention due to fear.  She has been experiencing confusion and dizziness since the fall, along with soreness in her neck and a sensation that her leg might 'bust wide open.' She visited a cardiologist who advised discontinuing amlodipine  and switching to olmesartan , which is currently on backorder. She is unsure about the dosage of olmesartan  but has been advised to take 20 mg.  She mentions significant hair loss and bleeding through gauze after the fall. Her hemoglobin levels are low, and she has been told she is anemic. She has been using petroleum jelly mixed with baby shampoo and cool water to manage the external wound, which has stopped bleeding externally but remains open.  She expresses reluctance to visit the emergency room despite concerns about potential internal bleeding and the open nature of the wound. She inquires about taking iron pills to address her anemia.     Relevant past medical, surgical, family  and social history reviewed and updated as indicated. Interim medical history since our last visit reviewed. Allergies and medications reviewed and updated.  ROS per HPI unless specifically indicated above     Objective:    BP 108/67 (BP Location: Right Arm, Cuff Size: Normal)   Pulse 83   Temp 98.1 F (36.7 C) (Oral)   Ht 5' 2.5 (1.588 m)   Wt 132 lb 12.8 oz (60.2 kg)   SpO2 97%   BMI 23.90 kg/m   Wt Readings from Last 3 Encounters:  06/10/24 132 lb 12.8 oz (60.2 kg)  06/08/24 132 lb (59.9 kg)  06/05/24 125 lb 12.8 oz (57.1 kg)     Physical Exam Constitutional:      Appearance: Normal appearance.  HENT:     Head: Laceration present.     Comments: 4-6cm laceration on scalp, with no acute bleeding, swelling present tender on exam. Difficult to fully assess due to pain and hair. Pulmonary:     Effort: Pulmonary effort is normal.  Musculoskeletal:        General: Normal range of motion.  Skin:    Comments: Normal skin color  Neurological:     General: No focal deficit present.     Mental Status: She is alert. Mental status is at baseline.  Psychiatric:        Mood and Affect: Mood normal.  Behavior: Behavior normal.        Thought Content: Thought content normal.         06/10/2024    9:39 AM 06/05/2024    3:35 PM 04/02/2024    2:41 PM 02/28/2024    2:32 PM 02/06/2024    2:54 PM  Depression screen PHQ 2/9  Decreased Interest 2 2 2 2 2   Down, Depressed, Hopeless 1 2 3 2 2   PHQ - 2 Score 3 4 5 4 4   Altered sleeping 1 0 3 3 0  Tired, decreased energy 2 2 3 2 3   Change in appetite 1 1 3 2 2   Feeling bad or failure about yourself  2 2 3 2 2   Trouble concentrating 2 2 2 2 2   Moving slowly or fidgety/restless 2 1 2 2 2   Suicidal thoughts 2 2 1  0 1  PHQ-9 Score 15 14 22  17  16    Difficult doing work/chores Somewhat difficult Somewhat difficult Somewhat difficult  Somewhat difficult     Data saved with a previous flowsheet row definition       06/10/2024     9:41 AM 06/05/2024    3:36 PM 04/02/2024    2:42 PM 02/28/2024    2:33 PM  GAD 7 : Generalized Anxiety Score  Nervous, Anxious, on Edge 1 1 3 2   Control/stop worrying 3 3 3 3   Worry too much - different things 3 3 3 3   Trouble relaxing 2 2 3 2   Restless 2 2 2 2   Easily annoyed or irritable 2 2 2 2   Afraid - awful might happen 3 3 3 2   Total GAD 7 Score 16 16 19 16   Anxiety Difficulty Somewhat difficult Somewhat difficult Very difficult Somewhat difficult       Assessment & Plan:  Assessment & Plan   Traumatic injury of head, subsequent encounter Laceration of scalp without foreign body, subsequent encounter Scalp laceration with visible tissue, dizziness, fainting, and confusion post-fall. Difficult to fully evaluate laceration on exam but appears 4-6cm with swollen soft tissue. She was recently seen for this but no head imaging was scheduled or eval with hematology. Anemia further raises concern for internal bleeding. Head imaging necessary to rule out intracranial bleed. ER visit recommended for evaluation and treatment of laceration. Patient declines. I will get stat head ct, NSG and hematology consults.  - Ordered head imaging to rule out intracranial bleed. - Referred to neurosurgery for evaluation and potential suturing of scalp laceration. - Advised against using peroxide on the wound. - Recommended ER visit for immediate evaluation. -     CT HEAD WO CONTRAST ( ); Future -     Ambulatory referral to Neurosurgery  Iron deficiency anemia Anemia with low hemoglobin levels, exacerbated by head injury. Oral iron insufficient. Referral to hematology for iron infusion necessary. She will start iron pills in the meantime. -     Ambulatory referral to Hematology / Oncology   Follow up plan: Dr. Franchot visit scheduled for [facility].  Hadassah SHAUNNA Nett, MD

## 2024-06-11 ENCOUNTER — Ambulatory Visit: Payer: Self-pay | Admitting: Pediatrics

## 2024-06-11 ENCOUNTER — Ambulatory Visit
Admission: RE | Admit: 2024-06-11 | Discharge: 2024-06-11 | Disposition: A | Discharge status: Home / Self Care | Source: Ambulatory Visit | Attending: Pediatrics | Admitting: Pediatrics

## 2024-06-11 DIAGNOSIS — S0990XD Unspecified injury of head, subsequent encounter: Secondary | ICD-10-CM | POA: Insufficient documentation

## 2024-06-11 DIAGNOSIS — R220 Localized swelling, mass and lump, head: Secondary | ICD-10-CM | POA: Diagnosis not present

## 2024-06-11 DIAGNOSIS — R42 Dizziness and giddiness: Secondary | ICD-10-CM | POA: Diagnosis not present

## 2024-06-11 DIAGNOSIS — R519 Headache, unspecified: Secondary | ICD-10-CM | POA: Diagnosis not present

## 2024-06-11 DIAGNOSIS — R410 Disorientation, unspecified: Secondary | ICD-10-CM | POA: Diagnosis not present

## 2024-06-12 ENCOUNTER — Inpatient Hospital Stay: Admitting: Internal Medicine

## 2024-06-12 ENCOUNTER — Inpatient Hospital Stay

## 2024-06-15 ENCOUNTER — Ambulatory Visit: Admitting: Nurse Practitioner

## 2024-06-15 ENCOUNTER — Ambulatory Visit: Payer: Self-pay

## 2024-06-15 ENCOUNTER — Ambulatory Visit

## 2024-06-15 ENCOUNTER — Encounter: Payer: Self-pay | Admitting: Nurse Practitioner

## 2024-06-15 VITALS — BP 128/73 | HR 92 | Temp 97.9°F | Resp 15 | Ht 62.52 in | Wt 135.2 lb

## 2024-06-15 DIAGNOSIS — D509 Iron deficiency anemia, unspecified: Secondary | ICD-10-CM | POA: Diagnosis not present

## 2024-06-15 DIAGNOSIS — R600 Localized edema: Secondary | ICD-10-CM

## 2024-06-15 DIAGNOSIS — R0602 Shortness of breath: Secondary | ICD-10-CM | POA: Insufficient documentation

## 2024-06-15 NOTE — Patient Instructions (Addendum)
 Vitron or Slow Fe -- for iron replacement  Iron Deficiency Anemia, Adult  Iron deficiency anemia is when you do not have enough red blood cells or hemoglobin in your blood. This happens because you have too little iron in your body. Hemoglobin carries oxygen to parts of the body. Anemia can cause your body to not get enough oxygen. What are the causes? Not eating enough foods that have iron in them. The body not being able to take in iron well. Blood loss. What increases the risk? Having menstrual periods. Being pregnant. What are the signs or symptoms? Pale skin, lips, and nails. Weakness, dizziness, and getting tired easily. Feeling like you cannot breathe well when moving (shortness of breath). Cold hands and feet. Mild anemia may not cause any symptoms. How is this treated? This condition is treated by finding out why you do not have enough iron and then getting more iron. It may include: Adding foods to your diet that have a lot of iron. Taking iron pills (supplements). If you are pregnant or breastfeeding, you may need to take extra iron. Your diet often does not provide the amount of iron that you need. Getting more vitamin C in your diet. Vitamin C helps your body take in iron. You may need to take iron pills with a glass of orange juice or vitamin C pills. Medicines to make heavy menstrual periods lighter. Surgery or testing procedures to find what is causing the condition. You may need blood tests to see if treatment is working. If the treatment does not seem to be working, you may need more tests. Follow these instructions at home: Medicines Take over-the-counter and prescription medicines only as told by your doctor. This includes iron pills and vitamins. Taking them as told is important because too much iron can be harmful. Take iron pills when your stomach is empty. If you cannot handle this, take them with food. Do not drink milk or take antacids at the same time as your  iron pills. Iron pills may turn your poop (stool)black. If you cannot handle taking iron pills by mouth, ask your doctor about getting iron through: An IV tube. A shot (injection) into a muscle. Eating and drinking Talk with your doctor before changing the foods you eat. Your doctor may tell you to eat foods that have a lot of iron, such as: Liver. Low-fat (lean) beef. Breads and cereals that have iron added to them. Eggs. Dried fruit. Dark green, leafy vegetables. Eat fresh fruits and vegetables that are high in vitamin C. They help your body use iron. Foods with a lot of vitamin C include: Oranges. Peppers. Tomatoes. Mangoes. Managing constipation If you are taking iron pills, they may cause trouble pooping (constipation). To prevent or treat this, you may need to: Drink enough fluid to keep your pee (urine) pale yellow. Take over-the-counter or prescription medicines. Eat foods that are high in fiber. These include beans, whole grains, and fresh fruits and vegetables. Limit foods that are high in fat and sugar. These include fried or sweet foods. General instructions Return to your normal activities when your doctor says that it is safe. Keep all follow-up visits. Contact a doctor if: You feel like you may vomit (nauseous), or you vomit. You feel weak. You get light-headed when getting up from sitting or lying down. You are sweating for no reason. You have trouble pooping. You have worse breathing with physical activity. You have heaviness in your chest. Get help right away if:  You faint. If this happens, do not drive yourself to the hospital. You have a fast heartbeat, or a heartbeat that does not feel regular. Summary Iron deficiency anemia happens when you have too little iron in your body. This condition is treated by finding out why you do not have enough iron in your body and then getting more iron. Take over-the-counter and prescription medicines only as told by  your doctor. Eat fresh fruits and vegetables that are high in vitamin C. Contact a doctor if you have trouble pooping or feel weak. This information is not intended to replace advice given to you by your health care provider. Make sure you discuss any questions you have with your health care provider. Document Revised: 07/27/2021 Document Reviewed: 07/27/2021 Elsevier Patient Education  2024 [employer].

## 2024-06-15 NOTE — Progress Notes (Signed)
 BP 128/73 (BP Location: Left Arm, Patient Position: Sitting, Cuff Size: Normal)   Pulse 92   Temp 97.9 F (36.6 C) (Oral)   Resp 15   Ht 5' 2.52 (1.588 m)   Wt 135 lb 3.2 oz (61.3 kg)   SpO2 96%   BMI 24.32 kg/m    Subjective:    Patient ID: Nicole [name], female    DOB: [birth year], 63 y.o.   MRN: [id number]  HPI: Nicole [name] is a 63 y.o. female  Chief Complaint  Patient presents with   Shortness of Breath    Also started around the same time. Started about a week ago. Not open till the ED again at this time.    Edema    Swelling from toes to knees. Very painful and feels like she will bust.    ANEMIA Concerned with swelling to both legs and shortness of breath -- she reports this has been ongoing since head injury and is not worse. Recently noted on labs, she is scheduled to see hematology on 06/19/24. Saw cardiology on 06/08/24 with reassuring work-up, was taken off Amlodipine  due to swelling in legs -- took last dose yesterday. Changed to Olmesartan  and Doxazosin  by cardiology.  She reports she lost a lot of blood during her recent injury, CT head showed no bleed.  She is taking B12 supplement every morning.    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 Anemia status: uncontrolled Etiology of anemia: ?head injury Duration of anemia treatment: weeks, although had low levels in July 2025 during hospitalization Compliance with treatment: fair compliance Iron supplementation side effects: no Severity of anemia: moderate Fatigue: reports some energy, but gets tired easily Decreased exercise tolerance: yes  Dyspnea on exertion: yes Palpitations: no Bleeding: no Pica: yes -- eating more ice    Latest Ref Rng & Units 06/08/2024   11:17 AM 06/05/2024    3:49 PM 04/24/2024    4:41 AM  CBC  WBC 3.4 - 10.8 x10E3/uL 6.7  9.6  7.5   Hemoglobin 11.1 - 15.9 g/dL 8.3  7.6  86.7   Hematocrit 34.0 - 46.6 % 26.2  22.8   38.4   Platelets 150 - 450 x10E3/uL 315  258  240    Iron/TIBC/Ferritin/ %Sat    Component Value Date/Time   IRON 23 (L) 06/08/2024 1117   TIBC 317 06/08/2024 1117   FERRITIN 186 (H) 06/08/2024 1117   IRONPCTSAT 7 (LL) 06/08/2024 1117    Relevant past medical, surgical, family and social history reviewed and updated as indicated. Interim medical history since our last visit reviewed. Allergies and medications reviewed and updated.  Review of Systems  Constitutional:  Positive for fatigue. Negative for activity change, appetite change, diaphoresis and fever.  Respiratory:  Positive for shortness of breath. Negative for cough, chest tightness and wheezing.   Cardiovascular:  Positive for leg swelling. Negative for chest pain and palpitations.  Gastrointestinal: Negative.   Neurological: Negative.   Psychiatric/Behavioral: Negative.      Per HPI unless specifically indicated above     Objective:    BP 128/73 (BP Location: Left Arm, Patient Position: Sitting, Cuff Size: Normal)   Pulse 92   Temp 97.9 F (36.6 C) (Oral)   Resp 15   Ht 5' 2.52 (1.588 m)   Wt 135 lb 3.2 oz (61.3 kg)   SpO2 96%   BMI 24.32 kg/m   Wt Readings from  Last 3 Encounters:  06/15/24 135 lb 3.2 oz (61.3 kg)  06/10/24 132 lb 12.8 oz (60.2 kg)  06/08/24 132 lb (59.9 kg)    Physical Exam Vitals and nursing note reviewed.  Constitutional:      General: She is awake. She is not in acute distress.    Appearance: She is well-developed and well-groomed. She is not ill-appearing or toxic-appearing.  HENT:     Head: Normocephalic.     Right Ear: Hearing and external ear normal.     Left Ear: Hearing and external ear normal.  Eyes:     General: Lids are normal.        Right eye: No discharge.        Left eye: No discharge.     Conjunctiva/sclera: Conjunctivae normal.     Pupils: Pupils are equal, round, and reactive to light.  Neck:     Thyroid : No thyromegaly.     Vascular: No carotid bruit.   Cardiovascular:     Rate and Rhythm: Normal rate and regular rhythm.     Heart sounds: Normal heart sounds. No murmur heard.    No gallop.     Comments: Edema from feet to knees. Some discomfort noted with Homans bilateral lower extremities. Scattered scratch marks BLE, but no warmth. Pulmonary:     Effort: Pulmonary effort is normal. No accessory muscle usage or respiratory distress.     Breath sounds: Normal breath sounds. No decreased breath sounds, wheezing or rales.     Comments: No SOB with talking Abdominal:     General: Bowel sounds are normal. There is no distension.     Palpations: Abdomen is soft.     Tenderness: There is no abdominal tenderness.  Musculoskeletal:     Cervical back: Normal range of motion and neck supple.     Right lower leg: 2+ Pitting Edema present.     Left lower leg: 2+ Pitting Edema present.  Lymphadenopathy:     Cervical: No cervical adenopathy.  Skin:    General: Skin is warm and dry.  Neurological:     Mental Status: She is alert and oriented to person, place, and time.     Deep Tendon Reflexes: Reflexes are normal and symmetric.     Reflex Scores:      Brachioradialis reflexes are 2+ on the right side and 2+ on the left side.      Patellar reflexes are 2+ on the right side and 2+ on the left side. Psychiatric:        Attention and Perception: Attention normal.        Mood and Affect: Mood normal.        Speech: Speech normal.        Behavior: Behavior normal. Behavior is cooperative.        Thought Content: Thought content normal.     Results for orders placed or performed in visit on 06/08/24  Iron, TIBC and Ferritin Panel   Collection Time: 06/08/24 11:17 AM  Result Value Ref Range   Total Iron Binding Capacity 317 250 - 450 ug/dL   UIBC 705 881 - 630 ug/dL   Iron 23 (L) 27 - 139 ug/dL   Iron Saturation 7 (LL) 15 - 55 %   Ferritin 186 (H) 15 - 150 ng/mL  CBC   Collection Time: 06/08/24 11:17 AM  Result Value Ref Range   WBC 6.7  3.4 - 10.8 x10E3/uL   RBC 2.59 (LL) 3.77 - 5.28 x10E6/uL  Hemoglobin 8.3 (L) 11.1 - 15.9 g/dL   Hematocrit 73.7 (L) 65.9 - 46.6 %   MCV 101 (H) 79 - 97 fL   MCH 32.0 26.6 - 33.0 pg   MCHC 31.7 31.5 - 35.7 g/dL   RDW 85.8 88.2 - 84.5 %   Platelets 315 150 - 450 x10E3/uL      Assessment & Plan:   Problem List Items Addressed This Visit       Other   SOB (shortness of breath)   Refer to BLE edema plan of care.      Iron deficiency anemia - Primary   New onset over past couple weeks, scheduled to see hematology on Friday. Refer to BLE edema plan of care for further instructions.      Relevant Orders   CBC with Differential/Platelet   Ferritin   Iron   Comprehensive metabolic panel with GFR   Bilateral leg edema   Ongoing issue, having some pain with this. Will obtain venous doppler BLE to ensure no DVT. Suspect this is more related to her new onset anemia. She is scheduled to see hematology at the end of this week. Recheck labs today: CBC, iron, ferritin, CMP. Recommend Vitron or Slow Fe over the counter to start until sees hematology. May need iron infusion and would benefit GI referral for colonoscopy and further assessment if ongoing. Discussed with her. - Continue to collaborate with cardiology - Recommend compression hose on during day and off at night.      Relevant Orders   CBC with Differential/Platelet   Ferritin   Iron   Comprehensive metabolic panel with GFR   US  Venous Img Lower Bilateral (DVT)     Follow up plan: Return in about 2 weeks (around 06/29/2024) for EDEMA AND SOB.

## 2024-06-15 NOTE — Assessment & Plan Note (Signed)
 Ongoing issue, having some pain with this. Will obtain venous doppler BLE to ensure no DVT. Suspect this is more related to her new onset anemia. She is scheduled to see hematology at the end of this week. Recheck labs today: CBC, iron, ferritin, CMP. Recommend Vitron or Slow Fe over the counter to start until sees hematology. May need iron infusion and would benefit GI referral for colonoscopy and further assessment if ongoing. Discussed with her. - Continue to collaborate with cardiology - Recommend compression hose on during day and off at night.

## 2024-06-15 NOTE — Assessment & Plan Note (Signed)
 New onset over past couple weeks, scheduled to see hematology on Friday. Refer to BLE edema plan of care for further instructions.

## 2024-06-15 NOTE — Telephone Encounter (Signed)
 FYI Only or Action Required?: FYI only for provider: appointment scheduled on This afternoon.  Patient was last seen in primary care on 06/10/2024 by Herold Hadassah SQUIBB, MD.  Called Nurse Triage reporting Shortness of Breath. Leg swelling and tearful regarding medical issues  Symptoms began several weeks ago.  Interventions attempted: Other: seen in office.  Symptoms are: gradually worsening.  Triage Disposition: See HCP Within 4 Hours (Or PCP Triage)  Patient/caregiver understands and will follow disposition?: Yes                   Copied from CRM #[id number]. Topic: Clinical - Red Word Triage >> Jun 15, 2024  9:13 AM Nicole [name] wrote: Red Word that prompted transfer to Nurse Triage: Patient is calling to report shortness of breathe, with swelling in L & R legs (Worse in right leg) into toes. With itching pain in ankles and legs with standing.    Patient reporting elevated BP.   Cardiology started her 06/08/24 olmesartan  (BENICAR ) 20 MG tablet [[id number]] & doxazosin  (CARDURA ) 2 MG tablet [[id number]].  Been on Medication 4 days.   Requesting a fluid pill. Reason for Disposition  [1] MILD difficulty breathing (e.g., minimal/no SOB at rest, SOB with walking, pulse < 100) AND [2] NEW-onset or WORSE than normal  Answer Assessment - Initial Assessment Questions 1. RESPIRATORY STATUS: Describe your breathing? (e.g., wheezing, shortness of breath, unable to speak, severe coughing)      mild 2. ONSET: When did this breathing problem begin?      Ongoing for awhile worsening this past week 3. PATTERN Does the difficult breathing come and go, or has it been constant since it started?      Gets worse with any exertion 4. SEVERITY: How bad is your breathing? (e.g., mild, moderate, severe)      Out of breath very easily 5. RECURRENT SYMPTOM: Have you had difficulty breathing before? If Yes, ask: When was the last time? and What happened that time?      Yes - hx of  COPD 6. CARDIAC HISTORY: Do you have any history of heart disease? (e.g., heart attack, angina, bypass surgery, angioplasty)      yes 7. LUNG HISTORY: Do you have any history of lung disease?  (e.g., pulmonary embolus, asthma, emphysema)     Yes - copd 8. CAUSE: What do you think is causing the breathing problem?      COPD - possible heart  9. OTHER SYMPTOMS: Do you have any other symptoms? (e.g., chest pain, cough, dizziness, fever, runny nose)     no 10. O2 SATURATION MONITOR:  Do you use an oxygen saturation monitor (pulse oximeter) at home? If Yes, ask: What is your reading (oxygen level) today? What is your usual oxygen saturation reading? (e.g., 95%)       HR is 78, 120/96  Protocols used: Breathing Difficulty-A-AH

## 2024-06-15 NOTE — Assessment & Plan Note (Signed)
 Refer to BLE edema plan of care.

## 2024-06-16 ENCOUNTER — Ambulatory Visit: Payer: Self-pay | Admitting: Nurse Practitioner

## 2024-06-16 ENCOUNTER — Ambulatory Visit: Payer: Self-pay

## 2024-06-16 LAB — COMPREHENSIVE METABOLIC PANEL WITH GFR
ALT: 29 IU/L (ref 0–32)
AST: 58 IU/L — ABNORMAL HIGH (ref 0–40)
Albumin: 3.7 g/dL — ABNORMAL LOW (ref 3.9–4.9)
Alkaline Phosphatase: 102 IU/L (ref 49–135)
BUN/Creatinine Ratio: 18 (ref 12–28)
BUN: 24 mg/dL (ref 8–27)
Bilirubin Total: 0.3 mg/dL (ref 0.0–1.2)
CO2: 18 mmol/L — ABNORMAL LOW (ref 20–29)
Calcium: 9.1 mg/dL (ref 8.7–10.3)
Chloride: 107 mmol/L — ABNORMAL HIGH (ref 96–106)
Creatinine, Ser: 1.32 mg/dL — ABNORMAL HIGH (ref 0.57–1.00)
Globulin, Total: 2.7 g/dL (ref 1.5–4.5)
Glucose: 91 mg/dL (ref 70–99)
Potassium: 4.8 mmol/L (ref 3.5–5.2)
Sodium: 140 mmol/L (ref 134–144)
Total Protein: 6.4 g/dL (ref 6.0–8.5)
eGFR: 46 mL/min/1.73 — ABNORMAL LOW (ref >59)

## 2024-06-16 LAB — FERRITIN: Ferritin: 85 ng/mL (ref 15–150)

## 2024-06-16 LAB — CBC WITH DIFFERENTIAL/PLATELET
Basophils Absolute: 0.1 x10E3/uL (ref 0.0–0.2)
Basos: 1 %
EOS (ABSOLUTE): 0.2 x10E3/uL (ref 0.0–0.4)
Eos: 3 %
Hematocrit: 23.3 % — ABNORMAL LOW (ref 34.0–46.6)
Hemoglobin: 7.6 g/dL — ABNORMAL LOW (ref 11.1–15.9)
Immature Grans (Abs): 0 x10E3/uL (ref 0.0–0.1)
Immature Granulocytes: 0 %
Lymphocytes Absolute: 1.4 x10E3/uL (ref 0.7–3.1)
Lymphs: 19 %
MCH: 31 pg (ref 26.6–33.0)
MCHC: 32.6 g/dL (ref 31.5–35.7)
MCV: 95 fL (ref 79–97)
Monocytes Absolute: 0.7 x10E3/uL (ref 0.1–0.9)
Monocytes: 10 %
Neutrophils Absolute: 4.9 x10E3/uL (ref 1.4–7.0)
Neutrophils: 67 %
Platelets: 300 x10E3/uL (ref 150–450)
RBC: 2.45 x10E6/uL — CL (ref 3.77–5.28)
RDW: 14 % (ref 11.7–15.4)
WBC: 7.3 x10E3/uL (ref 3.4–10.8)

## 2024-06-16 LAB — IRON: Iron: 59 ug/dL (ref 27–139)

## 2024-06-16 NOTE — Progress Notes (Signed)
 Good afternoon, you see this patient for initial visit on the 19th. I rechecked labs yesterday since she came in for visit. H/H remains low, but iron a bit improved. She is symptomatic with her anemia, has been even on prior visits when this was first noticed. Some SOB and swelling both legs. She is aware of visit with you and possible next directions, including GI visit. Have a great day.

## 2024-06-16 NOTE — Telephone Encounter (Signed)
 FYI Only or Action Required?: Action required by provider: clinical question for provider and update on patient condition.  Patient was last seen in primary care on 06/15/2024 by Valerio Melanie DASEN, NP.  Called Nurse Triage reporting Leg Pain.  Symptoms began yesterday.  Interventions attempted: Other: elevation.  Symptoms are: gradually worsening.  Triage Disposition: See HCP Within 4 Hours (Or PCP Triage)  Patient/caregiver understands and will follow disposition?: Yes  Copied from CRM #[id number]. Topic: Clinical - Red Word Triage >> Jun 16, 2024 12:35 PM Fonda T wrote: [facility] that prompted transfer to Nurse Triage: Pt states she was seen in office, states she is having increased swelling, all the way to knees and down to toes, having pain due to swelling.   Pt reports she feels like they are going to pop.  Pt would like to know if medication can be called in for pain, since she was just seen on yesterday.   Ph. [phone] Reason for Disposition  [1] SEVERE pain (e.g., excruciating, unable to do any normal activities) AND [2] not improved after 2 hours of pain medicine  Answer Assessment - Initial Assessment Questions Reports no increase in swelling from appointment on 06/15/24, but is reporting increased pain in legs.   Patient asking for possible use of a fluid pill to address painful swelling and something for pain.  Has had oxycodone  in the hospital in the past and this treated her pain  [facility] in [place] on [address]  [phone]  1. ONSET: When did the pain start?      More than one week ago 2. LOCATION: Where is the pain located?      Bilateral leg pain from knees to feet 3. PAIN: How bad is the pain?    (Scale 1-10; or mild, moderate, severe)     8/10, worse when walking 4. WORK OR EXERCISE: Has there been any recent work or exercise that involved this part of the body?      denies 5. CAUSE: What do you think is causing the leg pain?     Swelling,  anemia 6. OTHER SYMPTOMS: Do you have any other symptoms? (e.g., chest pain, back pain, breathing difficulty, swelling, rash, fever, numbness, weakness)     Denies  Patient advised to elevate legs, use ace bandage or compression socks, gently massage legs  Protocols used: Leg Pain-A-AH

## 2024-06-16 NOTE — Progress Notes (Signed)
 Good afternoon, please let Nicole [name] know her labs have returned: - Hemoglobin and hematocrit, which we discussed yesterday, remain low but iron level has improved some. Keep visit with hematology on Friday as scheduled. I would recommend if any worsening SOB to immediately go to ER as may need further work-up and transfusion. - Kidney function continues to show Stage 3a kidney disease, no worsening, and liver function overall remains at baseline.  - Please ensure to attend imaging I ordered yesterday as discussed. Any questions? Keep being stellar!!  Thank you for allowing me to participate in your care.  I appreciate you. Kindest regards, Tahmid Stonehocker

## 2024-06-17 ENCOUNTER — Telehealth: Payer: Self-pay | Admitting: Pediatrics

## 2024-06-17 NOTE — Telephone Encounter (Signed)
 Spoke with Nicole [name] upon calling back. Verified there is no errors at this time.

## 2024-06-17 NOTE — Telephone Encounter (Signed)
 Copied from CRM #[id number]. Topic: General - Other >> Jun 17, 2024 12:48 PM Eva FALCON wrote: Reason for CRM: Ferol from [facility] is calling in, states pt is coming in for an Ultrasound today at 5:30 but states they never received paper order. Is requesting one to be faxed over ASAP or this patients appointment will be cancelled. She provided two fax numbers [phone] and [phone]. any other questions please feel free to contact Taryn at [phone].

## 2024-06-18 ENCOUNTER — Ambulatory Visit
Admission: RE | Admit: 2024-06-18 | Discharge: 2024-06-18 | Attending: Nurse Practitioner | Admitting: Nurse Practitioner

## 2024-06-18 DIAGNOSIS — R600 Localized edema: Secondary | ICD-10-CM | POA: Insufficient documentation

## 2024-06-18 DIAGNOSIS — M7989 Other specified soft tissue disorders: Secondary | ICD-10-CM | POA: Diagnosis not present

## 2024-06-18 DIAGNOSIS — M79605 Pain in left leg: Secondary | ICD-10-CM | POA: Diagnosis not present

## 2024-06-18 DIAGNOSIS — M79604 Pain in right leg: Secondary | ICD-10-CM | POA: Diagnosis not present

## 2024-06-18 NOTE — Progress Notes (Signed)
 Please let Nicole [name] know she does not have a DVT. Great news, no blood clots. There was a lymph node that was slightly enlargement but this is suspected to be benign finding and related to inflammation/irritation. They have recommend she have follow-up ultrasound in 3 months. I will alert her new PCP to these results for future visit.. Any questions?

## 2024-06-18 NOTE — Progress Notes (Signed)
 FYI for you Dr. Franchot. This is a patient of Dr. Herold, but since she is leaving this patient is scheduled to see you for a new patient visit in February. She currently has significant swelling in the legs, I suspect this is more related to some new onset anemia she will be seeing hematology for. However, she was having pain so obtained an ultrasound. There is no DVT, but was a mildly enlarged inguinal lymph node greater on left, suspected to be reactive but they recommend repeat ultrasound in 3 months which would be in March. Wanted to make you aware for once she establishes with you. Have a great day!!

## 2024-06-19 ENCOUNTER — Encounter: Payer: Self-pay | Admitting: Internal Medicine

## 2024-06-19 ENCOUNTER — Inpatient Hospital Stay: Attending: Internal Medicine | Admitting: Internal Medicine

## 2024-06-19 ENCOUNTER — Inpatient Hospital Stay

## 2024-06-19 DIAGNOSIS — D509 Iron deficiency anemia, unspecified: Secondary | ICD-10-CM | POA: Diagnosis not present

## 2024-06-19 DIAGNOSIS — D649 Anemia, unspecified: Secondary | ICD-10-CM

## 2024-06-19 LAB — CBC WITH DIFFERENTIAL/PLATELET
Abs Immature Granulocytes: 0.03 K/uL (ref 0.00–0.07)
Basophils Absolute: 0 K/uL (ref 0.0–0.1)
Basophils Relative: 1 %
Eosinophils Absolute: 0.3 K/uL (ref 0.0–0.5)
Eosinophils Relative: 5 %
HCT: 23.5 % — ABNORMAL LOW (ref 36.0–46.0)
Hemoglobin: 7.3 g/dL — ABNORMAL LOW (ref 12.0–15.0)
Immature Granulocytes: 1 %
Lymphocytes Relative: 23 %
Lymphs Abs: 1.4 K/uL (ref 0.7–4.0)
MCH: 30.2 pg (ref 26.0–34.0)
MCHC: 31.1 g/dL (ref 30.0–36.0)
MCV: 97.1 fL (ref 80.0–100.0)
Monocytes Absolute: 0.6 K/uL (ref 0.1–1.0)
Monocytes Relative: 10 %
Neutro Abs: 3.9 K/uL (ref 1.7–7.7)
Neutrophils Relative %: 60 %
Platelets: 267 K/uL (ref 150–400)
RBC: 2.42 MIL/uL — ABNORMAL LOW (ref 3.87–5.11)
RDW: 15.2 % (ref 11.5–15.5)
WBC: 6.3 K/uL (ref 4.0–10.5)
nRBC: 0 % (ref 0.0–0.2)

## 2024-06-19 LAB — COMPREHENSIVE METABOLIC PANEL WITH GFR
ALT: 42 U/L (ref 0–44)
AST: 78 U/L — ABNORMAL HIGH (ref 15–41)
Albumin: 3.6 g/dL (ref 3.5–5.0)
Alkaline Phosphatase: 97 U/L (ref 38–126)
Anion gap: 12 (ref 5–15)
BUN: 12 mg/dL (ref 8–23)
CO2: 20 mmol/L — ABNORMAL LOW (ref 22–32)
Calcium: 9 mg/dL (ref 8.9–10.3)
Chloride: 106 mmol/L (ref 98–111)
Creatinine, Ser: 1.06 mg/dL — ABNORMAL HIGH (ref 0.44–1.00)
GFR, Estimated: 59 mL/min — ABNORMAL LOW
Glucose, Bld: 108 mg/dL — ABNORMAL HIGH (ref 70–99)
Potassium: 4.3 mmol/L (ref 3.5–5.1)
Sodium: 137 mmol/L (ref 135–145)
Total Bilirubin: 0.2 mg/dL (ref 0.0–1.2)
Total Protein: 6.8 g/dL (ref 6.5–8.1)

## 2024-06-19 LAB — RETICULOCYTES
Immature Retic Fract: 16 % — ABNORMAL HIGH (ref 2.3–15.9)
RBC.: 2.4 MIL/uL — ABNORMAL LOW (ref 3.87–5.11)
Retic Count, Absolute: 65 K/uL (ref 19.0–186.0)
Retic Ct Pct: 2.7 % (ref 0.4–3.1)

## 2024-06-19 LAB — LACTATE DEHYDROGENASE: LDH: 352 U/L — ABNORMAL HIGH (ref 105–235)

## 2024-06-19 LAB — IRON AND TIBC
Iron: 13 ug/dL — ABNORMAL LOW (ref 28–170)
Saturation Ratios: 3 % — ABNORMAL LOW (ref 10.4–31.8)
TIBC: 434 ug/dL (ref 250–450)
UIBC: 421 ug/dL

## 2024-06-19 LAB — C-REACTIVE PROTEIN: CRP: <3 mg/dL — ABNORMAL HIGH

## 2024-06-19 LAB — VITAMIN B12: Vitamin B-12: 2286 pg/mL — ABNORMAL HIGH (ref 180–914)

## 2024-06-19 LAB — FERRITIN: Ferritin: 66 ng/mL (ref 11–307)

## 2024-06-19 MED ORDER — FUROSEMIDE 20 MG PO TABS: 20.0000 mg | ORAL_TABLET | Freq: Every day | ORAL | 0 refills | Status: DC

## 2024-06-19 NOTE — Progress Notes (Signed)
 [facility] CONSULT NOTE  Patient Care Team: Herold Hadassah SQUIBB, MD as PCP - General (Family Medicine) Rennie Cindy SAUNDERS, MD as Consulting Physician (Oncology)  CHIEF COMPLAINTS/PURPOSE OF CONSULTATION: ANEMIA   HEMATOLOGY HISTORY  # ANEMIA[Hb; MCV-platelets- WBC; Iron sat; ferritin;  GFR- CT/US ; EGD/colonoscopy-  HISTORY OF PRESENTING ILLNESS:  Nicole [name] 63 y.o.  female with multiple issues with a history of heavy alcohol use bipolar disorder substance abuse poorly controlled blood pressure, congestive heart failure is referred to us  for further evaluation of ongoing anemia.  Discussed the use of AI scribe software for clinical note transcription with the patient, who gave verbal consent to proceed.  History of Present Illness   Nicole [name] is a 63 year old female with symptomatic anemia who presents for evaluation of persistent anemia and severe lower extremity edema.  She was first noted to have anemia during a hospitalization in October for decubitus ulcers. Her hemoglobin remains persistently low at 7.6 g/dL. She has not received blood transfusions. Upper endoscopy in July did not reveal gastrointestinal bleeding. She denies melena, hematochezia, hematuria, and vaginal bleeding. She has not undergone colonoscopy or bone marrow biopsy. She takes a daily multivitamin with iron and B12 but has not taken dedicated iron supplementation. She has been advised to increase dietary intake of green vegetables.  She reports persistent, painful bilateral lower extremity edema from toes to knees for approximately 30 days. Previous episodes of edema resolved overnight, but the current episode is more severe and refractory. She has not received diuretic therapy or used compression stockings. Lower extremity ultrasound showed no deep vein thrombosis. Enlarged inguinal lymph nodes were noted on imaging. She denies signs of infection such as erythema or welts.  She has  a history of hypertension that has been difficult to control, with recent systolic blood pressures up to 286 mmHg. She is currently taking olmesartan  and hydrochlorothiazide ; amlodipine  was discontinued due to concern for worsening edema. She experiences muscle spasms during hypertensive episodes and has received lorazepam  for symptomatic relief.  She has COPD and continues to smoke. She experiences dyspnea, which she attributes to both anemia and COPD, as well as episodes of fatigue and exertional breathlessness. Her husband notes she sometimes overexerts herself and has episodes of shortness of breath.  Approximately one month ago, she sustained a head injury with facial swelling and significant blood loss following a fall, but did not seek medical attention. She subsequently underwent a CT scan of the brain as part of her recent workup.  She discontinued heavy alcohol use one month ago, coinciding with her last hospitalization, and has since gained weight.  Recent laboratory studies showed a creatinine clearance of 46. Recent CT imaging of the liver was performed.       Blood in stools:none; colonoscopy- EGD- July 2025- Dr.Wohl]- no bleeidng.  Blood in urine:none Difficulty swallowing:none Change of bowel movement/constipation:none Prior blood transfusion: none Kidney or Liver disease:none Alcohol: quit alcohol in OCT 2025.  Bariatric surgery: none   Vaginal bleeding: none Prior evaluation with hematology:none Prior bone marrow biopsy: none Oral iron:  with MVT.  Prior IV iron infusions: none    Review of Systems  Constitutional:  Positive for malaise/fatigue and weight loss. Negative for chills, diaphoresis and fever.  HENT:  Negative for nosebleeds and sore throat.   Eyes:  Negative for double vision.  Respiratory:  Positive for cough and shortness of breath. Negative for hemoptysis, sputum production and wheezing.   Cardiovascular:  Negative for chest pain,  palpitations,  orthopnea and leg swelling.  Gastrointestinal:  Negative for abdominal pain, blood in stool, constipation, diarrhea, heartburn, melena, nausea and vomiting.  Genitourinary:  Negative for dysuria, frequency and urgency.  Musculoskeletal:  Positive for back pain and joint pain.  Skin: Negative.  Negative for itching and rash.  Neurological:  Negative for dizziness, tingling, focal weakness, weakness and headaches.  Endo/Heme/Allergies:  Does not bruise/bleed easily.  Psychiatric/Behavioral:  Negative for depression. The patient is not nervous/anxious and does not have insomnia.      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   Substance abuse (HCC)     SURGICAL HISTORY: Past Surgical History:  Procedure Laterality Date   ABDOMINAL HYSTERECTOMY     ESOPHAGOGASTRODUODENOSCOPY N/A 01/16/2024   Procedure: EGD (ESOPHAGOGASTRODUODENOSCOPY);  Surgeon: Jinny Carmine, MD;  Location: [facility] ENDOSCOPY;  Service: Endoscopy;  Laterality: N/A;   TONSILLECTOMY     TONSILLECTOMY      SOCIAL HISTORY: Social History   Socioeconomic History   Marital status: Married    Spouse name: Not on file   Number of children: 4   Years of education: Not on file   Highest education level: Not on file  Occupational History   Occupation: Housewife  Tobacco Use   Smoking status: Every Day    Current packs/day: 2.00    Average packs/day: 2.0 packs/day for 33.0 years (66.0 ttl pk-yrs)    Types: Cigarettes   Smokeless tobacco: Never   Tobacco comments:    1PPD 02/19/2022, currently using nicotine  patches, patient also occasionally uses daughter's vape    Currently drinking 1 wine cooler 2 times per  week, no liquor since 01/30/22  Vaping Use   Vaping status: Never Used  Substance and Sexual Activity   Alcohol use: Not Currently    Alcohol/week: 4.0 standard drinks of alcohol    Types: 4 Standard drinks or equivalent per week    Comment: patient quit   Drug use: Not Currently    Types: Cocaine, Marijuana    Comment: uses marijuana occassionally, last Cocaine ~ 2017   Sexual activity: Not Currently  Other Topics Concern   Not on file  Social History Narrative   Not on file   Social Drivers of Health   Tobacco Use: High Risk (06/19/2024)   Patient History    Smoking Tobacco Use: Every Day    Smokeless Tobacco Use: Never    Passive Exposure: Not on file  Financial Resource Strain: Not on file  Food Insecurity: No Food Insecurity (06/19/2024)   Epic    Worried About Programme Researcher, Broadcasting/film/video in the Last Year: Never true    [employer] of Food in the Last Year: Never true  Transportation Needs: No Transportation Needs (06/19/2024)   Epic    Lack of Transportation (Medical): No    Lack of Transportation (Non-Medical): No  Physical Activity: Not on file  Stress: Not on file  Social Connections: Not on file  Intimate Partner Violence: Not At Risk (06/19/2024)   Epic    Fear of Current or Ex-Partner: No    Emotionally Abused: No    Physically Abused: No  Sexually Abused: No  Depression (PHQ2-9): Medium Risk (06/19/2024)   Depression (PHQ2-9)    PHQ-2 Score: 6  Alcohol Screen: Low Risk (10/03/2021)   Alcohol Screen    Last Alcohol Screening Score (AUDIT): 3  Housing: Low Risk (06/19/2024)   Epic    Unable to Pay for Housing in the Last Year: No    Number of Times Moved in the Last Year: 0    Homeless in the Last Year: No  Utilities: Not At Risk (06/19/2024)   Epic    Threatened with loss of utilities: No  Health Literacy: Not on file    FAMILY HISTORY: Family History  Problem Relation Age of Onset   Depression Mother    Hypertension Mother    Heart attack Mother     Insomnia Mother    Thyroid  disease Mother    Bipolar disorder Mother    Other Father        Parkinson's disease   Parkinson's disease Father    Arthritis Maternal Grandmother    Bipolar disorder Maternal Grandmother    Depression Maternal Grandmother    Other Maternal Grandfather        unknown medical history   Heart attack Paternal Grandmother    Ovarian cancer Paternal Grandmother        70's   Hyperlipidemia Paternal Grandfather    Hypertension Paternal Grandfather    Congestive Heart Failure Paternal Grandfather    Rheum arthritis Other    Osteoporosis Other    Clotting disorder Other    Stroke Other     ALLERGIES:  has no known allergies.  MEDICATIONS:  Current Outpatient Medications  Medication Sig Dispense Refill   albuterol  (PROVENTIL  HFA) 108 (90 Base) MCG/ACT inhaler Inhale 1-2 puffs into the lungs once every 6 (six) hours as needed for wheezing or shortness of breath. 6.7 g 5   budesonide -glycopyrrolate -formoterol  (BREZTRI  AEROSPHERE) 160-9-4.8 MCG/ACT AERO inhaler Inhale 2 puffs into the lungs 2 (two) times daily. 10.7 g 11   Buprenorphine  HCl-Naloxone  HCl 8-2 MG FILM Take 1 Film by mouth 2 (two) times daily. 60 Film 0   Cyanocobalamin (VITAMIN B 12 PO) Take 1 tablet by mouth daily.     doxazosin  (CARDURA ) 2 MG tablet Take 1 tablet (2 mg total) by mouth daily. 180 tablet 3   furosemide  (LASIX ) 20 MG tablet Take 1 tablet (20 mg total) by mouth daily. 14 tablet 0   gabapentin  (NEURONTIN ) 300 MG capsule Take by mouth.     hydrOXYzine  (ATARAX ) 25 MG tablet Take 1 tablet (25 mg total) by mouth every 8 (eight) hours as needed for itching. 30 tablet 2   Multiple Vitamin (MULTIVITAMIN) tablet Take 1 tablet by mouth daily.     naltrexone  (DEPADE) 50 MG tablet Take 50 mg by mouth daily.     olmesartan  (BENICAR ) 20 MG tablet Take 1 tablet (20 mg total) by mouth daily. 90 tablet 3   omeprazole  (PRILOSEC) 40 MG capsule Take 1 capsule (40 mg total) by mouth 2 (two) times daily.  180 capsule 1   SEROQUEL  200 MG tablet TAKE 1 TABLET BY MOUTH EVERYDAY AT BEDTIME 90 tablet 1   tiZANidine (ZANAFLEX) 4 MG tablet Take 4 mg by mouth once.     folic acid  (FOLVITE ) 1 MG tablet Take 1 tablet (1 mg total) by mouth daily. (Patient not taking: Reported on 06/19/2024) 30 tablet 0   No current facility-administered medications for this visit.     PHYSICAL EXAMINATION:   Vitals:  06/19/24 1101 06/19/24 1118  BP: (!) 154/89 (!) 157/88  Pulse: 88   Temp: 98 F (36.7 C)   SpO2: 96%    Filed Weights   06/19/24 1101  Weight: 134 lb (60.8 kg)    Physical Exam Vitals and nursing note reviewed.  HENT:     Head: Normocephalic and atraumatic.     Mouth/Throat:     Pharynx: Oropharynx is clear.  Eyes:     Extraocular Movements: Extraocular movements intact.     Pupils: Pupils are equal, round, and reactive to light.  Cardiovascular:     Rate and Rhythm: Normal rate and regular rhythm.  Pulmonary:     Comments: Decreased breath sounds bilaterally.  Abdominal:     Palpations: Abdomen is soft.  Musculoskeletal:        General: Normal range of motion.     Cervical back: Normal range of motion.  Skin:    General: Skin is warm.  Neurological:     General: No focal deficit present.     Mental Status: She is alert and oriented to person, place, and time.  Psychiatric:        Behavior: Behavior normal.        Judgment: Judgment normal.      LABORATORY DATA:  I have reviewed the data as listed Lab Results  Component Value Date   WBC 6.3 06/19/2024   HGB 7.3 (L) 06/19/2024   HCT 23.5 (L) 06/19/2024   MCV 97.1 06/19/2024   PLT 267 06/19/2024   Recent Labs    04/23/24 0357 04/24/24 0441 04/30/24 1136 06/05/24 1549 06/15/24 1427 06/19/24 1206  NA 130* 134*   < > 138 140 137  K 3.6 3.3*   < > 4.0 4.8 4.3  CL 105 101   < > 104 107* 106  CO2 22 23   < > 21 18* 20*  GLUCOSE 128* 118*   < > 127* 91 108*  BUN 10 25*   < > 35* 24 12  CREATININE 0.81 1.46*   < >  1.57* 1.32* 1.06*  CALCIUM 9.0 9.1   < > 8.9 9.1 9.0  GFRNONAA >60 41*  --   --   --  59*  PROT 8.7*  --    < > 6.5 6.4 6.8  ALBUMIN 3.7  --    < > 3.8* 3.7* 3.6  AST 86*  --    < > 41* 58* 78*  ALT 51*  --    < > 27 29 42  ALKPHOS 137*  --    < > 122 102 97  BILITOT 1.1  --    < > 0.2 0.3 0.2   < > = values in this interval not displayed.     US  Venous Img Lower Bilateral (DVT) Result Date: 06/18/2024 CLINICAL DATA:  BILATERAL lower extremity pain and swelling for 2 weeks EXAM: Bilateral Lower Extremity Venous Doppler Ultrasound TECHNIQUE: Gray-scale sonography with compression, as well as color and duplex ultrasound, were performed to evaluate the deep venous system(s) from the level of the common femoral vein through the popliteal and proximal calf veins. COMPARISON:  11/22/2015 FINDINGS: VENOUS Normal compressibility of the common femoral, superficial femoral, and popliteal veins, as well as the visualized calf veins. Visualized portions of profunda femoral vein and great saphenous vein unremarkable. No filling defects to suggest DVT on grayscale or color Doppler imaging. Doppler waveforms show normal direction of venous flow, normal respiratory plasticity and response to augmentation.  OTHER Mildly prominent BILATERAL inguinal lymph nodes are not enlarged by imaging criteria as they measure less than 1 cm on short axis. These are favored to be reactive. Largest lymph node on the RIGHT measures 2.1 x 0.7 x 1.6 cm. Largest lymph node on the LEFT measures 2.4 x 0.9 x 1.6 cm. Limitations: none IMPRESSION: 1. No lower extremity DVT. 2. Mildly prominent inguinal lymph nodes, greater on the LEFT, are favored to be reactive. Follow-up ultrasound of the LEFT inguinal region recommended in 3 months to document resolution. Electronically Signed   By: Aliene Lloyd M.D.   On: 06/18/2024 15:22   CT HEAD WO CONTRAST ( ) Result Date: 06/11/2024 EXAM: CT Head Without Intravenous Contrast. CLINICAL HISTORY:  63 year old female with dizziness, fall 1.5 weeks ago, persistent open and painful head wound, dizziness, and confusion. TECHNIQUE: Axial computed tomography images of the head/brain without intravenous contrast. Dose reduction technique was used including one or more of the following: automated exposure control, adjustment of mA and kV according to patient size, and/or iterative reconstruction. CONTRAST: Without. COMPARISON: Brain MRI 04/23/2024 and earlier. FINDINGS: BRAIN: Brain volume stable and within normal limits for age. No acute intraparenchymal hemorrhage. No mass lesion. No CT evidence for acute territorial infarct. No midline shift or extra-axial collection. Mild by CT patchy periventricular and cerebral white matter hypodensity. Stable gray white differentiation. Calcified atherosclerosis at the skull base. No suspicious intracranial vascular hyperdensity. VENTRICLES: No hydrocephalus. ORBITS: The orbits are unremarkable. SINUSES AND MASTOIDS: The paranasal sinuses, tympanic cavities, and mastoid air cells are clear. SOFT TISSUES: Left posterior vertex scalp soft tissue injury with skin deformity on series 2 image 71. No significant regional hematoma or fluid collection. No radiopaque foreign body is seen. BONES: Underlying calvarium intact. No acute skull fracture. IMPRESSION: 1. Left vertex scalp soft tissue injury with no complicating features or underlying skull fracture. 2. No acute intracranial abnormality. Electronically signed by: Helayne Hurst MD 06/11/2024 09:27 AM EST RP Workstation: [id number]    ASSESSMENT & PLAN:   Symptomatic anemia # Normocytic anemia-severe nadir about hemoglobin 7-multifactorial-iron deficiency, CKD stage III recent hospitalization poorly controlled blood pressures/heavy alcohol use/CHF excetra.  Currently low clinical concern of malignancy.   # I had a long discussion with the patient regarding etiology of unexplained anemia.   The etiologies include -benign  like nutritional/malabsorption, liver disease, chronic kidney disease viral infections, autoimmune; blood loss etc.  Low clinical concern for any malignant causes.  I recommend CBC CMP LDH haptoglobin B12 folic acid  reticulocyte count platelet immature fraction; review of peripheral smear.  Iron studies; ferritin.myeloma panel kappa lambda light chain ratio.  Hold off of bone marrow biopsy at this time.  December 2025 iron studies show ferritin 66 saturation 3%; recommend proceeding with iron infusions.  Discussed potential infusion reactions with iron infusions.  #  DEC 2025-ultrasound bilateral lower extremities -incidental largest lymph node on the RIGHT measures 2.1 x 0.7 x 1.6 cm. Largest lymph node on the LEFT measures 2.4 x 0.9 x 1.6 cm.  Radiologically-less suspicious of any malignancy.  Would repeat ultrasound again in 3 months.  # Bilateral leg swelling-suspicious of fluid overload-recommend Lasix  20 mg daily for the next 30 days.  Patient will follow-up with PCP for further refills and workup.  # History of poorly controlled blood pressures-currently improved recommend close monitoring of blood pressures.  # History of bipolar disorder substance abuse alcohol abuse-   Thank you Dr. Herold MD for allowing me to participate in the care of your  pleasant patient. Please do not hesitate to contact me with questions or concerns in the interim.  # DISPOSITION: # labs today-  # weekly venofer x3 - start ASAP # follow up 5 weeks- MD; labs- cbc/bmp; possible venofer- Dr.B    Cindy JONELLE Joe, MD 06/28/2024 10:25 PM

## 2024-06-19 NOTE — Progress Notes (Signed)
 Patient states complaints of fluid in both legs and feet that are causing her discomfort.

## 2024-06-19 NOTE — Assessment & Plan Note (Addendum)
#   Normocytic anemia-severe nadir about hemoglobin 7-multifactorial-iron deficiency, CKD stage III recent hospitalization poorly controlled blood pressures/heavy alcohol use/CHF excetra.  Currently low clinical concern of malignancy.   # I had a long discussion with the patient regarding etiology of unexplained anemia.   The etiologies include -benign like nutritional/malabsorption, liver disease, chronic kidney disease viral infections, autoimmune; blood loss etc.  Low clinical concern for any malignant causes.  I recommend CBC CMP LDH haptoglobin B12 folic acid  reticulocyte count platelet immature fraction; review of peripheral smear.  Iron studies; ferritin.myeloma panel kappa lambda light chain ratio.  Hold off of bone marrow biopsy at this time.  December 2025 iron studies show ferritin 66 saturation 3%; recommend proceeding with iron infusions.  Discussed potential infusion reactions with iron infusions.  #  DEC 2025-ultrasound bilateral lower extremities -incidental largest lymph node on the RIGHT measures 2.1 x 0.7 x 1.6 cm. Largest lymph node on the LEFT measures 2.4 x 0.9 x 1.6 cm.  Radiologically-less suspicious of any malignancy.  Would repeat ultrasound again in 3 months.  # Bilateral leg swelling-suspicious of fluid overload-recommend Lasix  20 mg daily for the next 30 days.  Patient will follow-up with PCP for further refills and workup.  # History of poorly controlled blood pressures-currently improved recommend close monitoring of blood pressures.  # History of bipolar disorder substance abuse alcohol abuse-   Thank you Dr. Herold MD for allowing me to participate in the care of your pleasant patient. Please do not hesitate to contact me with questions or concerns in the interim.  # DISPOSITION: # labs today-  # weekly venofer x3 - start ASAP # follow up 5 weeks- MD; labs- cbc/bmp; possible venofer- Dr.B

## 2024-06-20 LAB — ERYTHROPOIETIN: Erythropoietin: 71.7 m[IU]/mL — ABNORMAL HIGH (ref 2.6–18.5)

## 2024-06-20 LAB — HAPTOGLOBIN: Haptoglobin: 97 mg/dL (ref 37–355)

## 2024-06-22 LAB — KAPPA/LAMBDA LIGHT CHAINS
Kappa free light chain: 108.2 mg/L — ABNORMAL HIGH (ref 3.3–19.4)
Kappa, lambda light chain ratio: 1.35 (ref 0.26–1.65)
Lambda free light chains: 79.9 mg/L — ABNORMAL HIGH (ref 5.7–26.3)

## 2024-06-22 NOTE — Progress Notes (Signed)
 Returned call to patient. She has no questions at this time.

## 2024-06-22 NOTE — Telephone Encounter (Unsigned)
 Copied from CRM #[id number]. Topic: Clinical - Lab/Test Results >> Jun 22, 2024 11:55 AM Terri MATSU wrote: Reason for CRM: Patient calling for missed call for test results. I informed her on the results but she had follow up questions . Callback number  [phone]

## 2024-06-23 DIAGNOSIS — E66813 Obesity, class 3: Secondary | ICD-10-CM | POA: Diagnosis not present

## 2024-06-23 DIAGNOSIS — N179 Acute kidney failure, unspecified: Secondary | ICD-10-CM | POA: Diagnosis not present

## 2024-06-23 DIAGNOSIS — R531 Weakness: Secondary | ICD-10-CM | POA: Diagnosis not present

## 2024-06-27 NOTE — Patient Instructions (Signed)

## 2024-06-28 ENCOUNTER — Encounter: Payer: Self-pay | Admitting: Internal Medicine

## 2024-06-28 DIAGNOSIS — N179 Acute kidney failure, unspecified: Secondary | ICD-10-CM | POA: Diagnosis not present

## 2024-06-28 DIAGNOSIS — E66813 Obesity, class 3: Secondary | ICD-10-CM | POA: Diagnosis not present

## 2024-06-28 DIAGNOSIS — R531 Weakness: Secondary | ICD-10-CM | POA: Diagnosis not present

## 2024-06-29 ENCOUNTER — Inpatient Hospital Stay

## 2024-06-30 ENCOUNTER — Encounter: Payer: Self-pay | Admitting: Nurse Practitioner

## 2024-06-30 ENCOUNTER — Other Ambulatory Visit: Payer: Self-pay | Admitting: Pediatrics

## 2024-06-30 ENCOUNTER — Ambulatory Visit: Admitting: Nurse Practitioner

## 2024-06-30 VITALS — BP 118/79 | HR 95 | Temp 98.0°F | Resp 17 | Ht 62.52 in | Wt 123.2 lb

## 2024-06-30 DIAGNOSIS — J449 Chronic obstructive pulmonary disease, unspecified: Secondary | ICD-10-CM | POA: Diagnosis not present

## 2024-06-30 DIAGNOSIS — D509 Iron deficiency anemia, unspecified: Secondary | ICD-10-CM | POA: Diagnosis not present

## 2024-06-30 DIAGNOSIS — R600 Localized edema: Secondary | ICD-10-CM

## 2024-06-30 DIAGNOSIS — F1191 Opioid use, unspecified, in remission: Secondary | ICD-10-CM

## 2024-06-30 MED ORDER — BREZTRI AEROSPHERE 160-9-4.8 MCG/ACT IN AERO: 2.0000 | INHALATION_SPRAY | Freq: Two times a day (BID) | RESPIRATORY_TRACT | 11 refills | Status: AC

## 2024-06-30 MED ORDER — ALBUTEROL SULFATE HFA 108 (90 BASE) MCG/ACT IN AERS: 1.0000 | INHALATION_SPRAY | Freq: Four times a day (QID) | RESPIRATORY_TRACT | 5 refills | Status: AC | PRN

## 2024-06-30 MED ORDER — BUPRENORPHINE HCL-NALOXONE HCL 8-2 MG SL FILM: 1.0000 | ORAL_FILM | Freq: Two times a day (BID) | SUBLINGUAL | 0 refills | Status: AC

## 2024-06-30 MED ORDER — FUROSEMIDE 20 MG PO TABS: 20.0000 mg | ORAL_TABLET | Freq: Every day | ORAL | 2 refills | Status: AC

## 2024-06-30 NOTE — Assessment & Plan Note (Signed)
 New onset, will continue collaboration with hematology. Recent note and labs reviewed. Educated her on anemia. She is to start iron infusions upcoming. Continue Lasix  at this time for BLE edema.

## 2024-06-30 NOTE — Assessment & Plan Note (Signed)
 Chronic, ongoing. Sent in refills today per request. Provided one sample of Breztri .

## 2024-06-30 NOTE — Progress Notes (Signed)
 "  BP 118/79 (BP Location: Right Arm, Patient Position: Sitting, Cuff Size: Normal)   Pulse 95   Temp 98 F (36.7 C) (Oral)   Resp 17   Ht 5' 2.52 (1.588 m)   Wt 123 lb 3.2 oz (55.9 kg)   SpO2 93%   BMI 22.16 kg/m    Subjective:    Patient ID: Nicole [name], female    DOB: 1960-11-13, 63 y.o.   MRN: [id number]  HPI: Nicole [name] is a 63 y.o. female  Chief Complaint  Patient presents with   Follow-up    Here to check swelling, states that the swelling has went to her feet.   ANEMIA AND BLE EDEMA Seen by hematology for initial visit on 06/19/24 with further labs obtained, holding off on bone marrow biopsy. They have recommended starting iron infusions. Lasix  20 MG started for the next 30 days, this has helped swelling but it has gone to feet. Saw cardiology on 06/08/24 for her HTN.  Did have imaging, doppler, no DVT to BLE noted. There was a mildly prominent inguinal lymph node, L>R favored to be reactive in nature. To have follow-up imaging in 3 months for this, alerted her new PCP (who she establishes with in new year) to this. Anemia status: uncontrolled Etiology of anemia: unknown, possibly CKD related Duration of anemia treatment: weeks Compliance with treatment: good compliance Severity of anemia: moderate Fatigue: yes Decreased exercise tolerance: yes  Dyspnea on exertion: yes Palpitations: no Bleeding: has had two nose bleeds Pica: yes  Relevant past medical, surgical, family and social history reviewed and updated as indicated. Interim medical history since our last visit reviewed. Allergies and medications reviewed and updated.  Review of Systems  Constitutional:  Positive for fatigue. Negative for activity change, appetite change, diaphoresis and fever.  Respiratory:  Positive for shortness of breath. Negative for cough, chest tightness and wheezing.   Cardiovascular:  Positive for leg swelling (improving, now in feet only). Negative for chest pain  and palpitations.  Gastrointestinal: Negative.   Neurological: Negative.   Psychiatric/Behavioral: Negative.     Per HPI unless specifically indicated above     Objective:    BP 118/79 (BP Location: Right Arm, Patient Position: Sitting, Cuff Size: Normal)   Pulse 95   Temp 98 F (36.7 C) (Oral)   Resp 17   Ht 5' 2.52 (1.588 m)   Wt 123 lb 3.2 oz (55.9 kg)   SpO2 93%   BMI 22.16 kg/m   Wt Readings from Last 3 Encounters:  06/30/24 123 lb 3.2 oz (55.9 kg)  06/19/24 134 lb (60.8 kg)  06/15/24 135 lb 3.2 oz (61.3 kg)    Physical Exam Vitals and nursing note reviewed.  Constitutional:      General: She is awake. She is not in acute distress.    Appearance: She is well-developed and well-groomed. She is not ill-appearing or toxic-appearing.  HENT:     Head: Normocephalic.     Right Ear: Hearing and external ear normal.     Left Ear: Hearing and external ear normal.  Eyes:     General: Lids are normal.        Right eye: No discharge.        Left eye: No discharge.     Conjunctiva/sclera: Conjunctivae normal.     Pupils: Pupils are equal, round, and reactive to light.  Neck:     Thyroid : No thyromegaly.     Vascular: No carotid bruit.  Cardiovascular:  Rate and Rhythm: Normal rate and regular rhythm.     Heart sounds: Normal heart sounds. No murmur heard.    No gallop.     Comments: Edema improving to feet only now Pulmonary:     Effort: Pulmonary effort is normal. No accessory muscle usage or respiratory distress.     Breath sounds: Normal breath sounds. No decreased breath sounds, wheezing or rales.     Comments: No SOB with talking Abdominal:     General: Bowel sounds are normal. There is no distension.     Palpations: Abdomen is soft.     Tenderness: There is no abdominal tenderness.  Musculoskeletal:     Cervical back: Normal range of motion and neck supple.     Right lower leg: 1+ Edema present.     Left lower leg: 1+ Edema present.  Lymphadenopathy:      Cervical: No cervical adenopathy.  Skin:    General: Skin is warm and dry.  Neurological:     Mental Status: She is alert and oriented to person, place, and time.     Deep Tendon Reflexes: Reflexes are normal and symmetric.     Reflex Scores:      Brachioradialis reflexes are 2+ on the right side and 2+ on the left side.      Patellar reflexes are 2+ on the right side and 2+ on the left side. Psychiatric:        Attention and Perception: Attention normal.        Mood and Affect: Mood normal.        Speech: Speech normal.        Behavior: Behavior normal. Behavior is cooperative.        Thought Content: Thought content normal.    Results for orders placed or performed in visit on 06/19/24  Comprehensive metabolic panel with GFR   Collection Time: 06/19/24 12:06 PM  Result Value Ref Range   Sodium 137 135 - 145 mmol/L   Potassium 4.3 3.5 - 5.1 mmol/L   Chloride 106 98 - 111 mmol/L   CO2 20 (L) 22 - 32 mmol/L   Glucose, Bld 108 (H) 70 - 99 mg/dL   BUN 12 8 - 23 mg/dL   Creatinine, Ser 8.93 (H) 0.44 - 1.00 mg/dL   Calcium 9.0 8.9 - 89.6 mg/dL   Total Protein 6.8 6.5 - 8.1 g/dL   Albumin 3.6 3.5 - 5.0 g/dL   AST 78 (H) 15 - 41 U/L   ALT 42 0 - 44 U/L   Alkaline Phosphatase 97 38 - 126 U/L   Total Bilirubin 0.2 0.0 - 1.2 mg/dL   GFR, Estimated 59 (L) >60 mL/min   Anion gap 12 5 - 15  CBC with Differential/Platelet   Collection Time: 06/19/24 12:06 PM  Result Value Ref Range   WBC 6.3 4.0 - 10.5 K/uL   RBC 2.42 (L) 3.87 - 5.11 MIL/uL   Hemoglobin 7.3 (L) 12.0 - 15.0 g/dL   HCT 76.4 (L) 63.9 - 53.9 %   MCV 97.1 80.0 - 100.0 fL   MCH 30.2 26.0 - 34.0 pg   MCHC 31.1 30.0 - 36.0 g/dL   RDW 84.7 88.4 - 84.4 %   Platelets 267 150 - 400 K/uL   nRBC 0.0 0.0 - 0.2 %   Neutrophils Relative % 60 %   Neutro Abs 3.9 1.7 - 7.7 K/uL   Lymphocytes Relative 23 %   Lymphs Abs 1.4 0.7 - 4.0 K/uL  Monocytes Relative 10 %   Monocytes Absolute 0.6 0.1 - 1.0 K/uL   Eosinophils Relative 5 %    Eosinophils Absolute 0.3 0.0 - 0.5 K/uL   Basophils Relative 1 %   Basophils Absolute 0.0 0.0 - 0.1 K/uL   Immature Granulocytes 1 %   Abs Immature Granulocytes 0.03 0.00 - 0.07 K/uL  Kappa/lambda light chains   Collection Time: 06/19/24 12:06 PM  Result Value Ref Range   Kappa free light chain 108.2 (H) 3.3 - 19.4 mg/L   Lambda free light chains 79.9 (H) 5.7 - 26.3 mg/L   Kappa, lambda light chain ratio 1.35 0.26 - 1.65  Lactate dehydrogenase   Collection Time: 06/19/24 12:06 PM  Result Value Ref Range   LDH 352 (H) 105 - 235 U/L  Haptoglobin   Collection Time: 06/19/24 12:06 PM  Result Value Ref Range   Haptoglobin 97 37 - 355 mg/dL  Iron and TIBC   Collection Time: 06/19/24 12:06 PM  Result Value Ref Range   Iron 13 (L) 28 - 170 ug/dL   TIBC 565 749 - 549 ug/dL   Saturation Ratios 3 (L) 10.4 - 31.8 %   UIBC 421 ug/dL  Ferritin   Collection Time: 06/19/24 12:06 PM  Result Value Ref Range   Ferritin 66 11 - 307 ng/mL  Erythropoietin    Collection Time: 06/19/24 12:06 PM  Result Value Ref Range   Erythropoietin  71.7 (H) 2.6 - 18.5 mIU/mL  C-reactive protein   Collection Time: 06/19/24 12:07 PM  Result Value Ref Range   CRP <3.0 (H) <1.0 mg/dL  Reticulocytes   Collection Time: 06/19/24 12:07 PM  Result Value Ref Range   Retic Ct Pct 2.7 0.4 - 3.1 %   RBC. 2.40 (L) 3.87 - 5.11 MIL/uL   Retic Count, Absolute 65.0 19.0 - 186.0 K/uL   Immature Retic Fract 16.0 (H) 2.3 - 15.9 %  Vitamin B12   Collection Time: 06/19/24 12:07 PM  Result Value Ref Range   Vitamin B-12 2,286 (H) 180 - 914 pg/mL      Assessment & Plan:   Problem List Items Addressed This Visit       Respiratory   COPD (chronic obstructive pulmonary disease) (HCC)   Chronic, ongoing. Sent in refills today per request. Provided one sample of Breztri .      Relevant Medications   budesonide -glycopyrrolate -formoterol  (BREZTRI  AEROSPHERE) 160-9-4.8 MCG/ACT AERO inhaler   albuterol  (PROVENTIL  HFA) 108  (90 Base) MCG/ACT inhaler     Other   Iron deficiency anemia - Primary   New onset, will continue collaboration with hematology. Recent note and labs reviewed. Educated her on anemia. She is to start iron infusions upcoming. Continue Lasix  at this time for BLE edema.      Bilateral leg edema   Improving with Lasix  on board. Will maintain this for now and could consider discontinuing once anemia has improved.        Follow up plan: Return for as scheduled with Dr. Franchot in February to establish.      "

## 2024-06-30 NOTE — Assessment & Plan Note (Signed)
 Improving with Lasix  on board. Will maintain this for now and could consider discontinuing once anemia has improved.

## 2024-06-30 NOTE — Progress Notes (Signed)
 Sending suboxone  refills for 1 mo and 5 day to reach gap to new pcp visit on 08/13/2024 to take over.  Hadassah SHAUNNA Nett, MD

## 2024-07-06 ENCOUNTER — Inpatient Hospital Stay: Attending: Internal Medicine

## 2024-07-06 VITALS — BP 144/81 | HR 82 | Temp 97.9°F | Resp 16

## 2024-07-06 DIAGNOSIS — D649 Anemia, unspecified: Secondary | ICD-10-CM

## 2024-07-06 MED ORDER — IRON SUCROSE 20 MG/ML IV SOLN
200.0000 mg | Freq: Once | INTRAVENOUS | Status: AC
  Administered 2024-07-06: 200 mg via INTRAVENOUS
  Filled 2024-07-06: qty 10

## 2024-07-06 NOTE — Patient Instructions (Signed)

## 2024-07-13 ENCOUNTER — Inpatient Hospital Stay

## 2024-07-13 VITALS — BP 115/75 | HR 94 | Temp 97.6°F | Resp 17

## 2024-07-13 DIAGNOSIS — D649 Anemia, unspecified: Secondary | ICD-10-CM

## 2024-07-13 MED ORDER — IRON SUCROSE 20 MG/ML IV SOLN
200.0000 mg | Freq: Once | INTRAVENOUS | Status: AC
  Administered 2024-07-13: 200 mg via INTRAVENOUS

## 2024-07-13 MED ORDER — SODIUM CHLORIDE 0.9% FLUSH
10.0000 mL | Freq: Once | INTRAVENOUS | Status: AC | PRN
  Administered 2024-07-13: 10 mL
  Filled 2024-07-13: qty 10

## 2024-07-13 NOTE — Progress Notes (Signed)
 Patient tolerated Venofer  infusion well. Explained recommendation of 30 min post monitoring. Patient refused to wait post monitoring. Educated on what signs to watch for & to call with any concerns. No questions, discharged. Stable

## 2024-07-13 NOTE — Patient Instructions (Signed)

## 2024-07-14 ENCOUNTER — Ambulatory Visit: Admitting: Infectious Diseases

## 2024-07-20 ENCOUNTER — Inpatient Hospital Stay

## 2024-07-20 VITALS — BP 171/93 | HR 86 | Temp 96.8°F | Resp 18

## 2024-07-20 DIAGNOSIS — D649 Anemia, unspecified: Secondary | ICD-10-CM

## 2024-07-20 MED ORDER — SODIUM CHLORIDE 0.9% FLUSH
10.0000 mL | Freq: Once | INTRAVENOUS | Status: AC | PRN
  Administered 2024-07-20: 10 mL
  Filled 2024-07-20: qty 10

## 2024-07-20 MED ORDER — IRON SUCROSE 20 MG/ML IV SOLN
200.0000 mg | Freq: Once | INTRAVENOUS | Status: AC
  Administered 2024-07-20: 200 mg via INTRAVENOUS
  Filled 2024-07-20: qty 10

## 2024-07-22 ENCOUNTER — Telehealth: Payer: Self-pay | Admitting: Internal Medicine

## 2024-07-22 NOTE — Telephone Encounter (Signed)
 Called to offer the change of appointment due to weather on 1/26- patient request to leave appointment as is and will call Monday if she can't get here.

## 2024-07-23 ENCOUNTER — Ambulatory Visit: Admitting: Infectious Diseases

## 2024-07-24 ENCOUNTER — Inpatient Hospital Stay

## 2024-07-24 ENCOUNTER — Other Ambulatory Visit: Payer: Self-pay | Admitting: Nurse Practitioner

## 2024-07-24 DIAGNOSIS — F1191 Opioid use, unspecified, in remission: Secondary | ICD-10-CM

## 2024-07-24 NOTE — Telephone Encounter (Signed)
 Copied from CRM #[id number]. Topic: Clinical - Medication Refill >> Jul 24, 2024  9:40 AM Wess RAMAN wrote: Medication: Buprenorphine  HCl-Naloxone  HCl 8-2 MG FILM   Has the patient contacted their pharmacy? No (Agent: If no, request that the patient contact the pharmacy for the refill. If patient does not wish to contact the pharmacy document the reason why and proceed with request.) (Agent: If yes, when and what did the pharmacy advise?)  This is the patient's preferred pharmacy:  [id number] [place], [place] - [address] [place] [address] Phone: [phone] Fax: [phone]  Is this the correct pharmacy for this prescription? Yes If no, delete pharmacy and type the correct one.   Has the prescription been filled recently? Yes  Is the patient out of the medication? No  Has the patient been seen for an appointment in the last year OR does the patient have an upcoming appointment? Yes  Can we respond through MyChart? No  Agent: Please be advised that Rx refills may take up to 3 business days. We ask that you follow-up with your pharmacy.

## 2024-07-24 NOTE — Telephone Encounter (Signed)
 Requested medications are due for refill today.  no  Requested medications are on the active medications list.  yes  Last refill. 07/10/2024 #60 0 rf, pt also has another refill in place of #10 starting 08/10/2024 - 08/15/2024 to bridge to new provider  Future visit scheduled.   no  Notes to clinic.  Refill not delegated.    Requested Prescriptions  Pending Prescriptions Disp Refills   Buprenorphine  HCl-Naloxone  HCl 8-2 MG FILM 60 Film 0    Sig: Take 1 Film by mouth 2 (two) times daily.     Not Delegated - Analgesics:  Opioid Agonist Combinations Failed - 07/24/2024  1:01 PM      Failed - This refill cannot be delegated      Passed - Urine Drug Screen completed in last 360 days      Passed - Valid encounter within last 3 months    Recent Outpatient Visits           3 weeks ago Iron  deficiency anemia, unspecified iron  deficiency anemia type   [place] [facility] [place], [place] T, NP   1 month ago Iron  deficiency anemia, unspecified iron  deficiency anemia type   [place] [facility] [facility] [place], Melanie T, NP   1 month ago Traumatic injury of head, subsequent encounter   [place] [facility] Herold Hadassah SQUIBB, MD   1 month ago Injury of head, initial encounter   [place] [facility] Melvin Pao, NP   2 months ago Lower extremity edema   [place] [facility] Herold Hadassah SQUIBB, MD       Future Appointments             In 1 week [place] Bodily, MD [facility]

## 2024-07-27 ENCOUNTER — Inpatient Hospital Stay

## 2024-07-27 ENCOUNTER — Inpatient Hospital Stay: Admitting: Internal Medicine

## 2024-07-28 ENCOUNTER — Other Ambulatory Visit: Payer: Self-pay | Admitting: Pediatrics

## 2024-07-28 ENCOUNTER — Other Ambulatory Visit: Payer: Self-pay | Admitting: Nurse Practitioner

## 2024-07-28 DIAGNOSIS — F1191 Opioid use, unspecified, in remission: Secondary | ICD-10-CM

## 2024-07-28 NOTE — Telephone Encounter (Signed)
 Copied from CRM #[id number]. Topic: Clinical - Medication Refill >> Jul 24, 2024  9:40 AM Wess RAMAN wrote: Medication: Buprenorphine  HCl-Naloxone  HCl 8-2 MG FILM   Has the patient contacted their pharmacy? No (Agent: If no, request that the patient contact the pharmacy for the refill. If patient does not wish to contact the pharmacy document the reason why and proceed with request.) (Agent: If yes, when and what did the pharmacy advise?)  This is the patient's preferred pharmacy:  [id number] - [place], [place] - [address] [place] [address] Phone: [phone] Fax: [phone]  Is this the correct pharmacy for this prescription? Yes If no, delete pharmacy and type the correct one.   Has the prescription been filled recently? Yes  Is the patient out of the medication? No  Has the patient been seen for an appointment in the last year OR does the patient have an upcoming appointment? Yes  Can we respond through MyChart? No  Agent: Please be advised that Rx refills may take up to 3 business days. We ask that you follow-up with your pharmacy. >> Jul 28, 2024 11:52 AM Ivette P wrote: PT called in for updated, pt said she does not want to go to [facility] and wants to stay with Jolene Cannidy. Wants to know if refill can be sent in. Please follow up with

## 2024-07-28 NOTE — Telephone Encounter (Signed)
 Requested medications are due for refill today.  unsure   Requested medications are on the active medications list.  yes   Last refill. Pt has 2 07/10/2024 #60 0 rf and 08/10/2024 #10 0 rf   Future visit scheduled.   no   Notes to clinic.  Refill not delegated.    Requested Prescriptions  Pending Prescriptions Disp Refills   Buprenorphine  HCl-Naloxone  HCl 8-2 MG FILM 60 Film 0    Sig: Take 1 Film by mouth 2 (two) times daily.     Not Delegated - Analgesics:  Opioid Agonist Combinations Failed - 07/28/2024 12:15 PM      Failed - This refill cannot be delegated      Passed - Urine Drug Screen completed in last 360 days      Passed - Valid encounter within last 3 months    Recent Outpatient Visits           4 weeks ago Iron  deficiency anemia, unspecified iron  deficiency anemia type   [place] [facility] [place], [place] T, NP   1 month ago Iron  deficiency anemia, unspecified iron  deficiency anemia type   [place] [facility] [place], Melanie T, NP   1 month ago Traumatic injury of head, subsequent encounter   [place] [facility] Herold Hadassah SQUIBB, MD   1 month ago Injury of head, initial encounter   [place] [facility] Melvin Pao, NP   2 months ago Lower extremity edema   [place] [facility] Herold Hadassah SQUIBB, MD       Future Appointments             In 1 week [place] Bodily, MD [facility]

## 2024-07-28 NOTE — Telephone Encounter (Signed)
 Requested medications are due for refill today.  unsure  Requested medications are on the active medications list.  yes  Last refill. Pt has 2 07/10/2024 #60 0 rf and 08/10/2024 #10 0 rf  Future visit scheduled.   no  Notes to clinic.  Refill not delegated.     Requested Prescriptions  Pending Prescriptions Disp Refills   Buprenorphine  HCl-Naloxone  HCl 8-2 MG FILM 60 Film 0    Sig: Take 1 Film by mouth 2 (two) times daily.     Not Delegated - Analgesics:  Opioid Agonist Combinations Failed - 07/28/2024 12:13 PM      Failed - This refill cannot be delegated      Passed - Urine Drug Screen completed in last 360 days      Passed - Valid encounter within last 3 months    Recent Outpatient Visits           4 weeks ago Iron  deficiency anemia, unspecified iron  deficiency anemia type   [place] [facility] [place], [place] T, NP   1 month ago Iron  deficiency anemia, unspecified iron  deficiency anemia type   [place] [facility] [place], Melanie T, NP   1 month ago Traumatic injury of head, subsequent encounter   [place] [facility] Herold Hadassah SQUIBB, MD   1 month ago Injury of head, initial encounter   [place] [facility] Melvin Pao, NP   2 months ago Lower extremity edema   [place] [facility] Herold Hadassah SQUIBB, MD       Future Appointments             In 1 week [place] Bodily, MD [facility]

## 2024-07-29 ENCOUNTER — Telehealth: Payer: Self-pay | Admitting: Nurse Practitioner

## 2024-07-29 NOTE — Telephone Encounter (Signed)
 Copied from CRM #[id number]. Topic: Clinical - Prescription Issue >> Jul 29, 2024 11:13 AM Tiffini S wrote: Reason for CRM: Patient was seen twice by pcp- requested a medication refill for  Buprenorphine  HCl-Naloxone  HCl 8-2 MG FILM stripes and need samples for budesonide -glycopyrrolate -formoterol  (BREZTRI  AEROSPHERE) 160-9-4.8 MCG/ACT AERO inhaler   Please call the patient back at [phone]

## 2024-07-29 NOTE — Telephone Encounter (Signed)
 [facility] there should be a prescription on file for the medication to be filled. Pharmacy states that they do not have this.   Can we send the Suboxone  in for the patient and can we give a Breztri  sample to the patient if we have it?

## 2024-07-30 NOTE — Telephone Encounter (Signed)
 Called and LVM asking for patient to please return my call.   OK for E2C2 to speak to patient and notify her of Jolene's message. Please also let her know that there is a sample of Breztri  here at the office that is signed out for her to pick up.

## 2024-07-31 ENCOUNTER — Other Ambulatory Visit: Payer: Self-pay | Admitting: Pediatrics

## 2024-07-31 NOTE — Telephone Encounter (Signed)
 Copied from CRM [id number]. Topic: Clinical - Prescription Issue >> Jul 31, 2024 12:07 PM Darshell M wrote: Reason for CRM: Patient called in stating her original new patient appointment that was scheduled for 2/12 was canceled and was rescheduled for 4/1. Patient stated she only had enough medication to last until 2/4. Per the office they are not able to see the patient sooner and she is on the wait list. Also they are not able to send in a refill until she has her new patient appointment and becomes established. Patient only have enough medication to last until 2/4 (Buprenorphine  HCl-Naloxone  HCl 8-2 MG FILM)

## 2024-07-31 NOTE — Telephone Encounter (Unsigned)
 Copied from CRM #[id number]. Topic: Clinical - Medication Refill >> Jul 31, 2024  2:00 PM Frederich PARAS wrote: Medication: omeprazole  (PRILOSEC) 40 MG   Has the patient contacted their pharmacy? No  This is the patient's preferred pharmacy:  [id number] [place], [place] - [address] [place] [address] Phone: [phone] Fax: [phone]  Is this the correct pharmacy for this prescription? Yes If no, delete pharmacy and type the correct one.   Has the prescription been filled recently? Yes  Is the patient out of the medication? Yes  Has the patient been seen for an appointment in the last year OR does the patient have an upcoming appointment? Yes  Can we respond through MyChart? No  Agent: Please be advised that Rx refills may take up to 3 business days. We ask that you follow-up with your pharmacy.

## 2024-07-31 NOTE — Telephone Encounter (Signed)
 Called and notified patient of Jolene's message. Patient verbalized understanding and states she is going to [facility] to get her Suboxone  until she can see Dr. Franchot. Also notified patient that the sample of Breztri  is ready for her to pick up at her convenience.

## 2024-08-03 NOTE — Telephone Encounter (Signed)
 Requested Prescriptions  Pending Prescriptions Disp Refills   omeprazole  (PRILOSEC) 40 MG capsule 180 capsule 1    Sig: Take 1 capsule (40 mg total) by mouth 2 (two) times daily.     Gastroenterology: Proton Pump Inhibitors Passed - 08/03/2024  1:49 PM      Passed - Valid encounter within last 12 months    Recent Outpatient Visits           1 month ago Iron  deficiency anemia, unspecified iron  deficiency anemia type   [place] [facility] [place], [place] T, NP   1 month ago Iron  deficiency anemia, unspecified iron  deficiency anemia type   [place] [facility], Melanie T, NP   1 month ago Traumatic injury of head, subsequent encounter   [place] [facility] Herold Hadassah SQUIBB, MD   1 month ago Injury of head, initial encounter   [place] [facility] Melvin Pao, NP   2 months ago Lower extremity edema   [place] [facility] Herold Hadassah SQUIBB, MD       Future Appointments             In 3 days [place] Bodily, MD [facility]

## 2024-08-06 ENCOUNTER — Ambulatory Visit: Admitting: Infectious Diseases

## 2024-08-07 ENCOUNTER — Telehealth: Payer: Self-pay | Admitting: Pediatrics

## 2024-08-07 NOTE — Telephone Encounter (Unsigned)
 Copied from CRM [id number]. Topic: General - Other >> Aug 07, 2024 12:31 PM Winona R wrote: RICK; Pt husband will be picking up the inhaler sample, his name is [name] (he's a big guy)

## 2024-08-07 NOTE — Telephone Encounter (Unsigned)
 Copied from CRM #[id number]. Topic: Clinical - Medication Question >> Aug 07, 2024  2:58 PM Gustabo D wrote: Pt wants to quit smoking and wants to know if be given Chantex

## 2024-08-13 ENCOUNTER — Encounter

## 2024-08-17 ENCOUNTER — Inpatient Hospital Stay: Admitting: Internal Medicine

## 2024-08-17 ENCOUNTER — Inpatient Hospital Stay

## 2024-08-18 ENCOUNTER — Ambulatory Visit: Admitting: Infectious Diseases

## 2024-09-30 ENCOUNTER — Encounter

## 2024-12-04 ENCOUNTER — Encounter: Admitting: Nurse Practitioner
